# Patient Record
Sex: Female | Born: 1935
Health system: Southern US, Community
[De-identification: ages and names within clinical notes are randomized; demographics above are authoritative.]

## PROBLEM LIST (undated history)

## (undated) DIAGNOSIS — R9431 Abnormal electrocardiogram [ECG] [EKG]: Secondary | ICD-10-CM

## (undated) DIAGNOSIS — I5189 Other ill-defined heart diseases: Secondary | ICD-10-CM

## (undated) DIAGNOSIS — K219 Gastro-esophageal reflux disease without esophagitis: Secondary | ICD-10-CM

## (undated) DIAGNOSIS — E039 Hypothyroidism, unspecified: Secondary | ICD-10-CM

## (undated) DIAGNOSIS — I10 Essential (primary) hypertension: Secondary | ICD-10-CM

## (undated) HISTORY — PX: JOINT REPLACEMENT: SHX530

## (undated) HISTORY — PX: ABDOMINAL HYSTERECTOMY: SHX81

---

## 2011-05-16 DIAGNOSIS — H6500 Acute serous otitis media, unspecified ear: Secondary | ICD-10-CM | POA: Diagnosis not present

## 2011-05-16 DIAGNOSIS — J019 Acute sinusitis, unspecified: Secondary | ICD-10-CM | POA: Diagnosis not present

## 2011-05-16 DIAGNOSIS — J029 Acute pharyngitis, unspecified: Secondary | ICD-10-CM | POA: Diagnosis not present

## 2011-05-16 DIAGNOSIS — J209 Acute bronchitis, unspecified: Secondary | ICD-10-CM | POA: Diagnosis not present

## 2011-05-16 DIAGNOSIS — I119 Hypertensive heart disease without heart failure: Secondary | ICD-10-CM | POA: Diagnosis not present

## 2011-08-17 DIAGNOSIS — M810 Age-related osteoporosis without current pathological fracture: Secondary | ICD-10-CM | POA: Diagnosis not present

## 2011-08-17 DIAGNOSIS — E782 Mixed hyperlipidemia: Secondary | ICD-10-CM | POA: Diagnosis not present

## 2011-08-17 DIAGNOSIS — I119 Hypertensive heart disease without heart failure: Secondary | ICD-10-CM | POA: Diagnosis not present

## 2011-08-17 DIAGNOSIS — J4489 Other specified chronic obstructive pulmonary disease: Secondary | ICD-10-CM | POA: Diagnosis not present

## 2011-08-17 DIAGNOSIS — R5381 Other malaise: Secondary | ICD-10-CM | POA: Diagnosis not present

## 2011-08-17 DIAGNOSIS — E559 Vitamin D deficiency, unspecified: Secondary | ICD-10-CM | POA: Diagnosis not present

## 2011-08-17 DIAGNOSIS — J449 Chronic obstructive pulmonary disease, unspecified: Secondary | ICD-10-CM | POA: Diagnosis not present

## 2011-08-17 DIAGNOSIS — R5383 Other fatigue: Secondary | ICD-10-CM | POA: Diagnosis not present

## 2011-08-17 DIAGNOSIS — E039 Hypothyroidism, unspecified: Secondary | ICD-10-CM | POA: Diagnosis not present

## 2011-08-17 DIAGNOSIS — M2550 Pain in unspecified joint: Secondary | ICD-10-CM | POA: Diagnosis not present

## 2011-08-17 DIAGNOSIS — K2100 Gastro-esophageal reflux disease with esophagitis, without bleeding: Secondary | ICD-10-CM | POA: Diagnosis not present

## 2011-09-10 DIAGNOSIS — R5383 Other fatigue: Secondary | ICD-10-CM | POA: Diagnosis not present

## 2011-09-10 DIAGNOSIS — I119 Hypertensive heart disease without heart failure: Secondary | ICD-10-CM | POA: Diagnosis not present

## 2011-09-10 DIAGNOSIS — H6500 Acute serous otitis media, unspecified ear: Secondary | ICD-10-CM | POA: Diagnosis not present

## 2011-09-10 DIAGNOSIS — R5381 Other malaise: Secondary | ICD-10-CM | POA: Diagnosis not present

## 2011-09-10 DIAGNOSIS — I739 Peripheral vascular disease, unspecified: Secondary | ICD-10-CM | POA: Diagnosis not present

## 2011-09-10 DIAGNOSIS — R42 Dizziness and giddiness: Secondary | ICD-10-CM | POA: Diagnosis not present

## 2011-09-22 DIAGNOSIS — R229 Localized swelling, mass and lump, unspecified: Secondary | ICD-10-CM | POA: Diagnosis not present

## 2011-09-22 DIAGNOSIS — R42 Dizziness and giddiness: Secondary | ICD-10-CM | POA: Diagnosis not present

## 2011-09-22 DIAGNOSIS — D230 Other benign neoplasm of skin of lip: Secondary | ICD-10-CM | POA: Diagnosis not present

## 2011-10-06 DIAGNOSIS — R229 Localized swelling, mass and lump, unspecified: Secondary | ICD-10-CM | POA: Diagnosis not present

## 2011-12-26 DIAGNOSIS — I119 Hypertensive heart disease without heart failure: Secondary | ICD-10-CM | POA: Diagnosis not present

## 2011-12-26 DIAGNOSIS — R42 Dizziness and giddiness: Secondary | ICD-10-CM | POA: Diagnosis not present

## 2011-12-26 DIAGNOSIS — E559 Vitamin D deficiency, unspecified: Secondary | ICD-10-CM | POA: Diagnosis not present

## 2011-12-26 DIAGNOSIS — E039 Hypothyroidism, unspecified: Secondary | ICD-10-CM | POA: Diagnosis not present

## 2011-12-26 DIAGNOSIS — H6500 Acute serous otitis media, unspecified ear: Secondary | ICD-10-CM | POA: Diagnosis not present

## 2011-12-26 DIAGNOSIS — E782 Mixed hyperlipidemia: Secondary | ICD-10-CM | POA: Diagnosis not present

## 2011-12-26 DIAGNOSIS — R5383 Other fatigue: Secondary | ICD-10-CM | POA: Diagnosis not present

## 2011-12-26 DIAGNOSIS — N390 Urinary tract infection, site not specified: Secondary | ICD-10-CM | POA: Diagnosis not present

## 2011-12-26 DIAGNOSIS — R5381 Other malaise: Secondary | ICD-10-CM | POA: Diagnosis not present

## 2012-01-09 DIAGNOSIS — E782 Mixed hyperlipidemia: Secondary | ICD-10-CM | POA: Diagnosis not present

## 2012-01-09 DIAGNOSIS — H6500 Acute serous otitis media, unspecified ear: Secondary | ICD-10-CM | POA: Diagnosis not present

## 2012-01-09 DIAGNOSIS — I119 Hypertensive heart disease without heart failure: Secondary | ICD-10-CM | POA: Diagnosis not present

## 2012-01-09 DIAGNOSIS — R42 Dizziness and giddiness: Secondary | ICD-10-CM | POA: Diagnosis not present

## 2012-03-03 DIAGNOSIS — Z23 Encounter for immunization: Secondary | ICD-10-CM | POA: Diagnosis not present

## 2012-03-07 DIAGNOSIS — Z23 Encounter for immunization: Secondary | ICD-10-CM | POA: Diagnosis not present

## 2012-05-08 DIAGNOSIS — J1189 Influenza due to unidentified influenza virus with other manifestations: Secondary | ICD-10-CM | POA: Diagnosis not present

## 2012-05-08 DIAGNOSIS — J029 Acute pharyngitis, unspecified: Secondary | ICD-10-CM | POA: Diagnosis not present

## 2012-05-08 DIAGNOSIS — J209 Acute bronchitis, unspecified: Secondary | ICD-10-CM | POA: Diagnosis not present

## 2012-07-25 DIAGNOSIS — R21 Rash and other nonspecific skin eruption: Secondary | ICD-10-CM | POA: Diagnosis not present

## 2012-07-25 DIAGNOSIS — R42 Dizziness and giddiness: Secondary | ICD-10-CM | POA: Diagnosis not present

## 2012-07-25 DIAGNOSIS — H6500 Acute serous otitis media, unspecified ear: Secondary | ICD-10-CM | POA: Diagnosis not present

## 2012-09-02 DIAGNOSIS — R5383 Other fatigue: Secondary | ICD-10-CM | POA: Diagnosis not present

## 2012-09-02 DIAGNOSIS — R5381 Other malaise: Secondary | ICD-10-CM | POA: Diagnosis not present

## 2012-09-02 DIAGNOSIS — I119 Hypertensive heart disease without heart failure: Secondary | ICD-10-CM | POA: Diagnosis not present

## 2012-09-15 DIAGNOSIS — R42 Dizziness and giddiness: Secondary | ICD-10-CM | POA: Diagnosis not present

## 2012-09-15 DIAGNOSIS — R5381 Other malaise: Secondary | ICD-10-CM | POA: Diagnosis not present

## 2012-09-15 DIAGNOSIS — I119 Hypertensive heart disease without heart failure: Secondary | ICD-10-CM | POA: Diagnosis not present

## 2012-09-15 DIAGNOSIS — H6500 Acute serous otitis media, unspecified ear: Secondary | ICD-10-CM | POA: Diagnosis not present

## 2012-09-15 DIAGNOSIS — E782 Mixed hyperlipidemia: Secondary | ICD-10-CM | POA: Diagnosis not present

## 2012-09-15 DIAGNOSIS — E039 Hypothyroidism, unspecified: Secondary | ICD-10-CM | POA: Diagnosis not present

## 2012-09-15 DIAGNOSIS — R5383 Other fatigue: Secondary | ICD-10-CM | POA: Diagnosis not present

## 2012-09-15 DIAGNOSIS — K2100 Gastro-esophageal reflux disease with esophagitis, without bleeding: Secondary | ICD-10-CM | POA: Diagnosis not present

## 2012-09-23 DIAGNOSIS — I2510 Atherosclerotic heart disease of native coronary artery without angina pectoris: Secondary | ICD-10-CM | POA: Diagnosis not present

## 2012-09-23 DIAGNOSIS — I119 Hypertensive heart disease without heart failure: Secondary | ICD-10-CM | POA: Diagnosis not present

## 2012-09-23 DIAGNOSIS — R0602 Shortness of breath: Secondary | ICD-10-CM | POA: Diagnosis not present

## 2012-10-14 DIAGNOSIS — R5383 Other fatigue: Secondary | ICD-10-CM | POA: Diagnosis not present

## 2012-10-14 DIAGNOSIS — I119 Hypertensive heart disease without heart failure: Secondary | ICD-10-CM | POA: Diagnosis not present

## 2012-10-14 DIAGNOSIS — R5381 Other malaise: Secondary | ICD-10-CM | POA: Diagnosis not present

## 2012-11-11 DIAGNOSIS — R5381 Other malaise: Secondary | ICD-10-CM | POA: Diagnosis not present

## 2012-11-11 DIAGNOSIS — I119 Hypertensive heart disease without heart failure: Secondary | ICD-10-CM | POA: Diagnosis not present

## 2012-11-11 DIAGNOSIS — J1189 Influenza due to unidentified influenza virus with other manifestations: Secondary | ICD-10-CM | POA: Diagnosis not present

## 2012-11-11 DIAGNOSIS — H6500 Acute serous otitis media, unspecified ear: Secondary | ICD-10-CM | POA: Diagnosis not present

## 2012-11-11 DIAGNOSIS — R5383 Other fatigue: Secondary | ICD-10-CM | POA: Diagnosis not present

## 2012-11-19 DIAGNOSIS — I119 Hypertensive heart disease without heart failure: Secondary | ICD-10-CM | POA: Diagnosis not present

## 2012-11-19 DIAGNOSIS — R42 Dizziness and giddiness: Secondary | ICD-10-CM | POA: Diagnosis not present

## 2012-11-19 DIAGNOSIS — M2550 Pain in unspecified joint: Secondary | ICD-10-CM | POA: Diagnosis not present

## 2012-11-19 DIAGNOSIS — H6500 Acute serous otitis media, unspecified ear: Secondary | ICD-10-CM | POA: Diagnosis not present

## 2013-01-15 DIAGNOSIS — Z23 Encounter for immunization: Secondary | ICD-10-CM | POA: Diagnosis not present

## 2013-01-19 DIAGNOSIS — K219 Gastro-esophageal reflux disease without esophagitis: Secondary | ICD-10-CM | POA: Diagnosis not present

## 2013-01-19 DIAGNOSIS — I10 Essential (primary) hypertension: Secondary | ICD-10-CM | POA: Diagnosis not present

## 2013-01-19 DIAGNOSIS — E039 Hypothyroidism, unspecified: Secondary | ICD-10-CM | POA: Diagnosis not present

## 2013-01-19 DIAGNOSIS — E785 Hyperlipidemia, unspecified: Secondary | ICD-10-CM | POA: Diagnosis not present

## 2013-02-18 DIAGNOSIS — R0602 Shortness of breath: Secondary | ICD-10-CM | POA: Diagnosis not present

## 2013-02-19 ENCOUNTER — Telehealth (HOSPITAL_COMMUNITY): Payer: Self-pay | Admitting: *Deleted

## 2013-02-20 ENCOUNTER — Encounter (HOSPITAL_COMMUNITY): Payer: Self-pay | Admitting: *Deleted

## 2013-02-20 ENCOUNTER — Other Ambulatory Visit (HOSPITAL_COMMUNITY): Payer: Self-pay | Admitting: Internal Medicine

## 2013-02-20 DIAGNOSIS — R0602 Shortness of breath: Secondary | ICD-10-CM

## 2013-03-01 DIAGNOSIS — K449 Diaphragmatic hernia without obstruction or gangrene: Secondary | ICD-10-CM | POA: Diagnosis not present

## 2013-03-01 DIAGNOSIS — I517 Cardiomegaly: Secondary | ICD-10-CM | POA: Diagnosis not present

## 2013-03-01 DIAGNOSIS — M8448XA Pathological fracture, other site, initial encounter for fracture: Secondary | ICD-10-CM | POA: Diagnosis not present

## 2013-03-01 DIAGNOSIS — R030 Elevated blood-pressure reading, without diagnosis of hypertension: Secondary | ICD-10-CM | POA: Diagnosis not present

## 2013-03-01 DIAGNOSIS — E876 Hypokalemia: Secondary | ICD-10-CM | POA: Diagnosis not present

## 2013-03-01 DIAGNOSIS — S32009A Unspecified fracture of unspecified lumbar vertebra, initial encounter for closed fracture: Secondary | ICD-10-CM | POA: Diagnosis not present

## 2013-03-01 DIAGNOSIS — I129 Hypertensive chronic kidney disease with stage 1 through stage 4 chronic kidney disease, or unspecified chronic kidney disease: Secondary | ICD-10-CM | POA: Diagnosis not present

## 2013-03-01 DIAGNOSIS — S22009A Unspecified fracture of unspecified thoracic vertebra, initial encounter for closed fracture: Secondary | ICD-10-CM | POA: Diagnosis not present

## 2013-03-01 DIAGNOSIS — J9819 Other pulmonary collapse: Secondary | ICD-10-CM | POA: Diagnosis not present

## 2013-03-01 DIAGNOSIS — J449 Chronic obstructive pulmonary disease, unspecified: Secondary | ICD-10-CM | POA: Diagnosis not present

## 2013-03-01 DIAGNOSIS — M899 Disorder of bone, unspecified: Secondary | ICD-10-CM | POA: Diagnosis not present

## 2013-03-01 DIAGNOSIS — I959 Hypotension, unspecified: Secondary | ICD-10-CM | POA: Diagnosis not present

## 2013-03-01 DIAGNOSIS — N189 Chronic kidney disease, unspecified: Secondary | ICD-10-CM | POA: Diagnosis not present

## 2013-03-02 DIAGNOSIS — J9819 Other pulmonary collapse: Secondary | ICD-10-CM | POA: Diagnosis not present

## 2013-03-02 DIAGNOSIS — M8448XA Pathological fracture, other site, initial encounter for fracture: Secondary | ICD-10-CM | POA: Diagnosis not present

## 2013-03-03 ENCOUNTER — Encounter (HOSPITAL_COMMUNITY): Payer: Self-pay

## 2013-03-17 ENCOUNTER — Encounter (HOSPITAL_COMMUNITY): Payer: Self-pay

## 2013-04-21 DIAGNOSIS — I10 Essential (primary) hypertension: Secondary | ICD-10-CM | POA: Diagnosis not present

## 2013-04-21 DIAGNOSIS — Z0000 Encounter for general adult medical examination without abnormal findings: Secondary | ICD-10-CM | POA: Diagnosis not present

## 2013-04-21 DIAGNOSIS — E669 Obesity, unspecified: Secondary | ICD-10-CM | POA: Diagnosis not present

## 2013-04-21 DIAGNOSIS — E039 Hypothyroidism, unspecified: Secondary | ICD-10-CM | POA: Diagnosis not present

## 2013-04-24 ENCOUNTER — Telehealth (HOSPITAL_COMMUNITY): Payer: Self-pay | Admitting: *Deleted

## 2013-04-27 ENCOUNTER — Other Ambulatory Visit (HOSPITAL_COMMUNITY): Payer: Self-pay | Admitting: Internal Medicine

## 2013-04-27 DIAGNOSIS — Z1231 Encounter for screening mammogram for malignant neoplasm of breast: Secondary | ICD-10-CM

## 2013-05-04 ENCOUNTER — Inpatient Hospital Stay (HOSPITAL_COMMUNITY)
Admission: EM | Admit: 2013-05-04 | Discharge: 2013-05-07 | DRG: 202 | Disposition: A | Discharge status: Home / Self Care | Payer: Medicare Other | Attending: Internal Medicine | Admitting: Internal Medicine

## 2013-05-04 DIAGNOSIS — I951 Orthostatic hypotension: Secondary | ICD-10-CM | POA: Diagnosis not present

## 2013-05-04 DIAGNOSIS — R0602 Shortness of breath: Secondary | ICD-10-CM | POA: Diagnosis not present

## 2013-05-04 DIAGNOSIS — K219 Gastro-esophageal reflux disease without esophagitis: Secondary | ICD-10-CM | POA: Diagnosis present

## 2013-05-04 DIAGNOSIS — E860 Dehydration: Secondary | ICD-10-CM | POA: Diagnosis not present

## 2013-05-04 DIAGNOSIS — I10 Essential (primary) hypertension: Secondary | ICD-10-CM | POA: Diagnosis not present

## 2013-05-04 DIAGNOSIS — R9431 Abnormal electrocardiogram [ECG] [EKG]: Secondary | ICD-10-CM

## 2013-05-04 DIAGNOSIS — E039 Hypothyroidism, unspecified: Secondary | ICD-10-CM | POA: Diagnosis present

## 2013-05-04 DIAGNOSIS — R51 Headache: Secondary | ICD-10-CM | POA: Diagnosis not present

## 2013-05-04 DIAGNOSIS — I5031 Acute diastolic (congestive) heart failure: Secondary | ICD-10-CM

## 2013-05-04 DIAGNOSIS — E871 Hypo-osmolality and hyponatremia: Secondary | ICD-10-CM | POA: Diagnosis not present

## 2013-05-04 DIAGNOSIS — D649 Anemia, unspecified: Secondary | ICD-10-CM | POA: Diagnosis present

## 2013-05-04 DIAGNOSIS — I509 Heart failure, unspecified: Secondary | ICD-10-CM | POA: Diagnosis present

## 2013-05-04 DIAGNOSIS — Z79899 Other long term (current) drug therapy: Secondary | ICD-10-CM

## 2013-05-04 DIAGNOSIS — J209 Acute bronchitis, unspecified: Secondary | ICD-10-CM | POA: Diagnosis not present

## 2013-05-04 DIAGNOSIS — E8779 Other fluid overload: Secondary | ICD-10-CM | POA: Diagnosis present

## 2013-05-04 DIAGNOSIS — J189 Pneumonia, unspecified organism: Secondary | ICD-10-CM

## 2013-05-04 DIAGNOSIS — J9600 Acute respiratory failure, unspecified whether with hypoxia or hypercapnia: Secondary | ICD-10-CM | POA: Diagnosis not present

## 2013-05-04 DIAGNOSIS — R0989 Other specified symptoms and signs involving the circulatory and respiratory systems: Secondary | ICD-10-CM | POA: Diagnosis not present

## 2013-05-04 DIAGNOSIS — I959 Hypotension, unspecified: Secondary | ICD-10-CM | POA: Diagnosis not present

## 2013-05-04 DIAGNOSIS — I059 Rheumatic mitral valve disease, unspecified: Secondary | ICD-10-CM | POA: Diagnosis not present

## 2013-05-04 DIAGNOSIS — R42 Dizziness and giddiness: Secondary | ICD-10-CM | POA: Diagnosis not present

## 2013-05-04 DIAGNOSIS — R059 Cough, unspecified: Secondary | ICD-10-CM | POA: Diagnosis not present

## 2013-05-04 DIAGNOSIS — R0600 Dyspnea, unspecified: Secondary | ICD-10-CM

## 2013-05-04 DIAGNOSIS — J9819 Other pulmonary collapse: Secondary | ICD-10-CM | POA: Diagnosis not present

## 2013-05-04 DIAGNOSIS — R0609 Other forms of dyspnea: Secondary | ICD-10-CM | POA: Diagnosis not present

## 2013-05-04 HISTORY — DX: Essential (primary) hypertension: I10

## 2013-05-04 HISTORY — DX: Hypothyroidism, unspecified: E03.9

## 2013-05-04 HISTORY — DX: Gastro-esophageal reflux disease without esophagitis: K21.9

## 2013-05-04 HISTORY — DX: Abnormal electrocardiogram (ECG) (EKG): R94.31

## 2013-05-04 HISTORY — DX: Other ill-defined heart diseases: I51.89

## 2013-05-04 LAB — GLUCOSE, CAPILLARY

## 2013-05-04 MED ORDER — SODIUM CHLORIDE 0.9 % IV BOLUS (SEPSIS)
1000.0000 mL | Freq: Once | INTRAVENOUS | Status: AC
  Administered 2013-05-05: 1000 mL via INTRAVENOUS

## 2013-05-04 NOTE — ED Provider Notes (Signed)
CSN: [id number]     Arrival date & time 05/04/13  2308 History   First MD Initiated Contact with Patient 05/04/13 2358     Chief Complaint  Patient presents with  . Shortness of Breath  . Dizziness   (Consider location/radiation/quality/duration/timing/severity/associated sxs/prior Treatment) HPI Hx per PT and her son bedside. Not feeling well yesterday with cough and generalized weakness. Called her son requesting to come to the ER. She has developed SOB, HA and near syncope.  No F/C. No sore throat ir runny nose, no productive cough. No known sick contacts.  At triage noted to have BP 58/40. No ABd pain, no N/V/D. No CP. No diff with speech.   No past medical history on file. No past surgical history on file. No family history on file. History  Substance Use Topics  . Smoking status: Not on file  . Smokeless tobacco: Not on file  . Alcohol Use: Not on file   OB History   No data available     Review of Systems  Constitutional: Negative for fever and chills.  HENT: Negative for congestion, rhinorrhea and sore throat.   Eyes: Negative for visual disturbance.  Respiratory: Positive for cough and shortness of breath.   Cardiovascular: Negative for chest pain.  Gastrointestinal: Negative for vomiting and abdominal pain.  Genitourinary: Negative for dysuria.  Musculoskeletal: Negative for back pain, neck pain and neck stiffness.  Skin: Negative for rash.  Neurological: Positive for dizziness and headaches.  All other systems reviewed and are negative.    Allergies  Review of patient's allergies indicates not on file.  Home Medications  No current outpatient prescriptions on file. BP 58/40  Pulse 61  Temp(Src) 97.7 F (36.5 C) (Oral)  Resp 18  Ht 5\' 5"  (1.651 m)  Wt 160 lb (72.576 kg)  BMI 26.63 kg/m2  SpO2 97% Physical Exam  Constitutional: She is oriented to person, place, and time. She appears well-developed and well-nourished.  HENT:  Head: Normocephalic and  atraumatic.  Mouth/Throat: No oropharyngeal exudate.  Dry mm  Eyes: EOM are normal. Pupils are equal, round, and reactive to light.  Neck: Neck supple.  Cardiovascular: Normal rate, regular rhythm and intact distal pulses.   Pulmonary/Chest: Effort normal and breath sounds normal. No respiratory distress. She exhibits no tenderness.  Abdominal: Soft. She exhibits no distension. There is no tenderness.  Musculoskeletal: Normal range of motion. She exhibits no edema and no tenderness.  Neurological: She is alert and oriented to person, place, and time. No cranial nerve deficit. Coordination normal.  Equal grips strengths, weak. No facial droop or pronator drift  Skin: Skin is warm and dry.    ED Course  Procedures (including critical care time) Labs Review Labs Reviewed  GLUCOSE, CAPILLARY - Abnormal; Notable for the following:    Glucose-Capillary 116 (*)    All other components within normal limits  CBC WITH DIFFERENTIAL - Abnormal; Notable for the following:    HCT 34.9 (*)    Platelets 420 (*)    Monocytes Relative 15 (*)    Monocytes Absolute 1.5 (*)    All other components within normal limits  COMPREHENSIVE METABOLIC PANEL - Abnormal; Notable for the following:    Sodium 123 (*)    Chloride 86 (*)    Glucose, Bld 113 (*)    GFR calc non Af Amer 54 (*)    GFR calc Af Amer 62 (*)    All other components within normal limits  URINALYSIS, ROUTINE W REFLEX  MICROSCOPIC - Abnormal; Notable for the following:    APPearance CLOUDY (*)    All other components within normal limits  POCT I-STAT, CHEM 8 - Abnormal; Notable for the following:    Sodium 124 (*)    Chloride 90 (*)    Creatinine, Ser 1.20 (*)    Glucose, Bld 112 (*)    Calcium, Ion 1.11 (*)    All other components within normal limits  CG4 I-STAT (LACTIC ACID) - Abnormal; Notable for the following:    Lactic Acid, Venous 2.30 (*)    All other components within normal limits  POCT I-STAT TROPONIN I - Abnormal;  Notable for the following:    Troponin i, poc 0.11 (*)    All other components within normal limits  TROPONIN I  TSH  T4, FREE  INFLUENZA PANEL BY PCR   Imaging Review Ct Head Wo Contrast  05/05/2013   CLINICAL DATA:  Cough for about 2 weeks. Headache. Dizziness. Diaphoresis.  EXAM: CT HEAD WITHOUT CONTRAST  TECHNIQUE: Contiguous axial images were obtained from the base of the skull through the vertex without intravenous contrast.  COMPARISON:  None.  FINDINGS: Diffuse cerebral atrophy. No significant ventricular dilatation. Patchy low-attenuation changes in the deep white matter consistent with small vessel ischemic change. No mass effect or midline shift. No abnormal extra-axial fluid collections. Gray-white matter junctions are distinct. Basal cisterns are not effaced. No evidence of acute intracranial hemorrhage. No depressed skull fractures. Visualized paranasal sinuses and mastoid air cells are not opacified.  IMPRESSION: No acute intracranial abnormalities. Chronic atrophy and small vessel ischemic changes.   Electronically Signed   By: Burman Nieves M.D.   On: 05/05/2013 01:00   Dg Chest Portable 1 View  05/05/2013   CLINICAL DATA:  Shortness of breath and dizziness.  EXAM: PORTABLE CHEST - 1 VIEW  COMPARISON:  Chest radiograph February 18, 2013  FINDINGS: Cardiac silhouette appears mildly enlarged. Low inspiratory examination with crowded vasculature markings, mild central pulmonary vasculature congestion. Small left pleural effusion. Strandy densities in left lung base. No pneumothorax.  Multiple EKG lines overlie the patient and may obscure subtle underlying pathology. Degenerative changes of the cervical spine.  IMPRESSION: Mild cardiomegaly and central pulmonary vasculature congestion. Small left pleural effusion with left lower lobe atelectasis, less likely pneumonia. Consider follow-up PA and lateral views of the chest after treatment to verify improvement.   Electronically Signed    By: Awilda Metro   On: 05/05/2013 00:38    EKG Interpretation    Date/Time:  Monday May 04 2013 23:52:54 EST Ventricular Rate:  78 PR Interval:  179 QRS Duration: 99 QT Interval:  403 QTC Calculation: 459 R Axis:   26 Text Interpretation:  Sinus rhythm Low voltage, precordial leads Non-specific ST-t changes No old tracing to compare Confirmed by Britteny Fiebelkorn  MD, Adelma Bowdoin [phone]) on 05/05/2013 12:08:44 AM           CRITICAL CARE Performed by: Sunnie Nielsen Total critical care time: 40 Critical care time was exclusive of separately billable procedures and treating other patients. Critical care was necessary to treat or prevent imminent or life-threatening deterioration. Critical care was time spent personally by me on the following activities: development of treatment plan with patient and/or surrogate as well as nursing, discussions with consultants, evaluation of patient's response to treatment, examination of patient, obtaining history from patient or surrogate, ordering and performing treatments and interventions, ordering and review of laboratory studies, ordering and review of radiographic studies, pulse oximetry and  re-evaluation of patient's condition. Aggressive IVF resus for severe hypotension, improved with 2L IVFs, IV ABx intiated, no obvious infectious source with work up as above.   4:12 AM DR Adela Glimpse will evaluate bedside for admit MDM  Dx: Hypotension, dehydration, elevated lactate, hyponatremia, dyspnea  ECG, CXR, labs, CT brain reviewed as above Improved with IVF resuscitation IV ABx MED admit   Sunnie Nielsen, MD 05/05/13 [phone]

## 2013-05-04 NOTE — ED Notes (Signed)
Yesterday pt started to feel bad per son. Pt has been having a cough for about 2 weeks. This pm pt started to have a h/a, dry cough, dizziness and sob per son. Pt asked son to take her to to ED. In lobby upon standing pt got very dizzy and diaphoretic. In triage pt with b/p pof 58.

## 2013-05-05 ENCOUNTER — Emergency Department (HOSPITAL_COMMUNITY): Payer: Medicare Other

## 2013-05-05 ENCOUNTER — Encounter (HOSPITAL_COMMUNITY): Payer: Self-pay | Admitting: Emergency Medicine

## 2013-05-05 DIAGNOSIS — J209 Acute bronchitis, unspecified: Secondary | ICD-10-CM | POA: Diagnosis not present

## 2013-05-05 DIAGNOSIS — J189 Pneumonia, unspecified organism: Secondary | ICD-10-CM | POA: Insufficient documentation

## 2013-05-05 DIAGNOSIS — J9600 Acute respiratory failure, unspecified whether with hypoxia or hypercapnia: Secondary | ICD-10-CM | POA: Diagnosis not present

## 2013-05-05 DIAGNOSIS — E860 Dehydration: Secondary | ICD-10-CM | POA: Insufficient documentation

## 2013-05-05 DIAGNOSIS — R42 Dizziness and giddiness: Secondary | ICD-10-CM | POA: Diagnosis not present

## 2013-05-05 DIAGNOSIS — I5031 Acute diastolic (congestive) heart failure: Secondary | ICD-10-CM | POA: Diagnosis not present

## 2013-05-05 DIAGNOSIS — I059 Rheumatic mitral valve disease, unspecified: Secondary | ICD-10-CM

## 2013-05-05 DIAGNOSIS — I959 Hypotension, unspecified: Secondary | ICD-10-CM | POA: Diagnosis not present

## 2013-05-05 DIAGNOSIS — R51 Headache: Secondary | ICD-10-CM | POA: Diagnosis not present

## 2013-05-05 DIAGNOSIS — I951 Orthostatic hypotension: Secondary | ICD-10-CM | POA: Diagnosis not present

## 2013-05-05 DIAGNOSIS — R0989 Other specified symptoms and signs involving the circulatory and respiratory systems: Secondary | ICD-10-CM | POA: Diagnosis not present

## 2013-05-05 DIAGNOSIS — E871 Hypo-osmolality and hyponatremia: Secondary | ICD-10-CM | POA: Diagnosis present

## 2013-05-05 LAB — INFLUENZA PANEL BY PCR (TYPE A & B)
H1N1 flu by pcr: NOT DETECTED
Influenza A By PCR: NEGATIVE

## 2013-05-05 LAB — CBC
HCT: 32.5 % — ABNORMAL LOW (ref 36.0–46.0)
MCHC: 35.1 g/dL (ref 30.0–36.0)
MCV: 89.3 fL (ref 78.0–100.0)
Platelets: 344 10*3/uL (ref 150–400)
RBC: 3.64 MIL/uL — ABNORMAL LOW (ref 3.87–5.11)
RDW: 12.2 % (ref 11.5–15.5)
WBC: 8 10*3/uL (ref 4.0–10.5)

## 2013-05-05 LAB — PROCALCITONIN: Procalcitonin: <0.1 ng/mL

## 2013-05-05 LAB — CBC WITH DIFFERENTIAL/PLATELET
Basophils Absolute: 0.1 10*3/uL (ref 0.0–0.1)
Eosinophils Absolute: 0.3 10*3/uL (ref 0.0–0.7)
Eosinophils Relative: 3 % (ref 0–5)
Hemoglobin: 12.4 g/dL (ref 12.0–15.0)
Lymphocytes Relative: 34 % (ref 12–46)
MCV: 88.4 fL (ref 78.0–100.0)
Neutrophils Relative %: 48 % (ref 43–77)
Platelets: 420 10*3/uL — ABNORMAL HIGH (ref 150–400)
RDW: 12 % (ref 11.5–15.5)
WBC: 9.8 10*3/uL (ref 4.0–10.5)

## 2013-05-05 LAB — T4, FREE: Free T4: 1.48 ng/dL (ref 0.80–1.80)

## 2013-05-05 LAB — COMPREHENSIVE METABOLIC PANEL
ALT: 17 U/L (ref 0–35)
AST: 22 U/L (ref 0–37)
Albumin: 3.6 g/dL (ref 3.5–5.2)
CO2: 22 mEq/L (ref 19–32)
Calcium: 9.2 mg/dL (ref 8.4–10.5)
Chloride: 86 mEq/L — ABNORMAL LOW (ref 96–112)
Glucose, Bld: 113 mg/dL — ABNORMAL HIGH (ref 70–99)
Potassium: 4.1 mEq/L (ref 3.7–5.3)
Sodium: 123 mEq/L — ABNORMAL LOW (ref 137–147)
Total Bilirubin: 0.3 mg/dL (ref 0.3–1.2)
Total Protein: 6.4 g/dL (ref 6.0–8.3)

## 2013-05-05 LAB — POCT I-STAT, CHEM 8
BUN: 17 mg/dL (ref 6–23)
Chloride: 90 mEq/L — ABNORMAL LOW (ref 96–112)
HCT: 37 % (ref 36.0–46.0)
Sodium: 124 mEq/L — ABNORMAL LOW (ref 137–147)
TCO2: 23 mmol/L (ref 0–100)

## 2013-05-05 LAB — URINALYSIS, ROUTINE W REFLEX MICROSCOPIC
Bilirubin Urine: NEGATIVE
Hgb urine dipstick: NEGATIVE
Ketones, ur: NEGATIVE mg/dL
Leukocytes, UA: NEGATIVE
Specific Gravity, Urine: 1.023 (ref 1.005–1.030)
pH: 6 (ref 5.0–8.0)

## 2013-05-05 LAB — CG4 I-STAT (LACTIC ACID): Lactic Acid, Venous: 2.3 mmol/L — ABNORMAL HIGH (ref 0.5–2.2)

## 2013-05-05 LAB — TROPONIN I
Troponin I: <0.3 ng/mL (ref <0.30)
Troponin I: <0.3 ng/mL (ref <0.30)
Troponin I: <0.3 ng/mL (ref <0.30)
Troponin I: <0.3 ng/mL (ref <0.30)

## 2013-05-05 LAB — POCT I-STAT TROPONIN I: Troponin i, poc: 0.11 ng/mL (ref 0.00–0.08)

## 2013-05-05 LAB — CREATININE, SERUM: GFR calc Af Amer: 72 mL/min — ABNORMAL LOW (ref >90)

## 2013-05-05 MED ORDER — ASPIRIN 81 MG PO CHEW
324.0000 mg | CHEWABLE_TABLET | Freq: Once | ORAL | Status: AC
  Administered 2013-05-05: 324 mg via ORAL
  Filled 2013-05-05: qty 4

## 2013-05-05 MED ORDER — BENZONATATE 100 MG PO CAPS
200.0000 mg | ORAL_CAPSULE | Freq: Three times a day (TID) | ORAL | Status: DC | PRN
  Administered 2013-05-05 – 2013-05-06 (×2): 200 mg via ORAL
  Filled 2013-05-05 (×2): qty 2

## 2013-05-05 MED ORDER — ONDANSETRON HCL 4 MG/2ML IJ SOLN: 4.0000 mg | Freq: Four times a day (QID) | INTRAMUSCULAR | Status: DC | PRN

## 2013-05-05 MED ORDER — DEXTROMETHORPHAN POLISTIREX 30 MG/5ML PO LQCR
30.0000 mg | Freq: Two times a day (BID) | ORAL | Status: DC
  Administered 2013-05-05 – 2013-05-07 (×5): 30 mg via ORAL
  Filled 2013-05-05 (×7): qty 5

## 2013-05-05 MED ORDER — AZITHROMYCIN 500 MG PO TABS
500.0000 mg | ORAL_TABLET | Freq: Every day | ORAL | Status: DC
  Administered 2013-05-05 – 2013-05-07 (×3): 500 mg via ORAL
  Filled 2013-05-05 (×3): qty 1

## 2013-05-05 MED ORDER — VITAMINS A & D EX OINT
TOPICAL_OINTMENT | CUTANEOUS | Status: AC
  Administered 2013-05-05: 5
  Filled 2013-05-05: qty 5

## 2013-05-05 MED ORDER — PANTOPRAZOLE SODIUM 40 MG PO TBEC
40.0000 mg | DELAYED_RELEASE_TABLET | Freq: Every day | ORAL | Status: DC
  Administered 2013-05-05 – 2013-05-07 (×3): 40 mg via ORAL
  Filled 2013-05-05 (×3): qty 1

## 2013-05-05 MED ORDER — FUROSEMIDE 10 MG/ML IJ SOLN
20.0000 mg | Freq: Once | INTRAMUSCULAR | Status: AC
  Administered 2013-05-05: 20 mg via INTRAVENOUS
  Filled 2013-05-05: qty 2

## 2013-05-05 MED ORDER — DEXTROMETHORPHAN POLISTIREX 30 MG/5ML PO LQCR
30.0000 mg | Freq: Two times a day (BID) | ORAL | Status: DC | PRN
  Administered 2013-05-05: 30 mg via ORAL
  Filled 2013-05-05: qty 5

## 2013-05-05 MED ORDER — SODIUM CHLORIDE 0.9 % IV BOLUS (SEPSIS)
1000.0000 mL | Freq: Once | INTRAVENOUS | Status: AC
  Administered 2013-05-05: 1000 mL via INTRAVENOUS

## 2013-05-05 MED ORDER — OSELTAMIVIR PHOSPHATE 75 MG PO CAPS
75.0000 mg | ORAL_CAPSULE | Freq: Two times a day (BID) | ORAL | Status: DC
  Administered 2013-05-05: 75 mg via ORAL
  Filled 2013-05-05 (×2): qty 1

## 2013-05-05 MED ORDER — LOPERAMIDE HCL 2 MG PO CAPS
4.0000 mg | ORAL_CAPSULE | ORAL | Status: DC | PRN
  Administered 2013-05-05 – 2013-05-06 (×2): 4 mg via ORAL
  Filled 2013-05-05 (×2): qty 2

## 2013-05-05 MED ORDER — SODIUM CHLORIDE 0.9 % IV SOLN
INTRAVENOUS | Status: DC
  Administered 2013-05-05: 01:00:00 via INTRAVENOUS

## 2013-05-05 MED ORDER — GUAIFENESIN ER 600 MG PO TB12
600.0000 mg | ORAL_TABLET | Freq: Two times a day (BID) | ORAL | Status: DC
  Administered 2013-05-05: 600 mg via ORAL
  Filled 2013-05-05 (×3): qty 1

## 2013-05-05 MED ORDER — ENOXAPARIN SODIUM 40 MG/0.4ML ~~LOC~~ SOLN
40.0000 mg | SUBCUTANEOUS | Status: DC
  Administered 2013-05-05 – 2013-05-07 (×3): 40 mg via SUBCUTANEOUS
  Filled 2013-05-05 (×4): qty 0.4

## 2013-05-05 MED ORDER — GUAIFENESIN-DM 100-10 MG/5ML PO SYRP: 5.0000 mL | ORAL_SOLUTION | ORAL | Status: DC | PRN

## 2013-05-05 MED ORDER — GUAIFENESIN ER 600 MG PO TB12: 600.0000 mg | ORAL_TABLET | Freq: Two times a day (BID) | ORAL | Status: DC

## 2013-05-05 MED ORDER — PIPERACILLIN-TAZOBACTAM 3.375 G IVPB
3.3750 g | Freq: Once | INTRAVENOUS | Status: AC
  Administered 2013-05-05: 3.375 g via INTRAVENOUS
  Filled 2013-05-05: qty 50

## 2013-05-05 MED ORDER — GUAIFENESIN ER 600 MG PO TB12
1200.0000 mg | ORAL_TABLET | Freq: Two times a day (BID) | ORAL | Status: DC
  Administered 2013-05-05 – 2013-05-07 (×4): 1200 mg via ORAL
  Filled 2013-05-05 (×5): qty 2

## 2013-05-05 MED ORDER — IPRATROPIUM BROMIDE 0.02 % IN SOLN
0.5000 mg | Freq: Four times a day (QID) | RESPIRATORY_TRACT | Status: DC
  Administered 2013-05-05: 0.5 mg via RESPIRATORY_TRACT
  Filled 2013-05-05: qty 2.5

## 2013-05-05 MED ORDER — VANCOMYCIN HCL IN DEXTROSE 1-5 GM/200ML-% IV SOLN
1000.0000 mg | Freq: Once | INTRAVENOUS | Status: AC
  Administered 2013-05-05: 1000 mg via INTRAVENOUS
  Filled 2013-05-05: qty 200

## 2013-05-05 MED ORDER — IPRATROPIUM BROMIDE 0.02 % IN SOLN: 0.5000 mg | Freq: Four times a day (QID) | RESPIRATORY_TRACT | Status: DC | PRN

## 2013-05-05 MED ORDER — DEXTROSE 5 % IV SOLN
1.0000 g | INTRAVENOUS | Status: DC
  Administered 2013-05-05 – 2013-05-06 (×2): 1 g via INTRAVENOUS
  Filled 2013-05-05 (×2): qty 10

## 2013-05-05 MED ORDER — SODIUM CHLORIDE 0.9 % IV SOLN
INTRAVENOUS | Status: DC
  Administered 2013-05-05: 06:00:00 via INTRAVENOUS

## 2013-05-05 MED ORDER — LEVALBUTEROL HCL 0.63 MG/3ML IN NEBU: 0.6300 mg | INHALATION_SOLUTION | RESPIRATORY_TRACT | Status: DC | PRN

## 2013-05-05 MED ORDER — LEVOTHYROXINE SODIUM 88 MCG PO TABS
88.0000 ug | ORAL_TABLET | Freq: Every day | ORAL | Status: DC
  Administered 2013-05-05 – 2013-05-07 (×3): 88 ug via ORAL
  Filled 2013-05-05 (×5): qty 1

## 2013-05-05 MED ORDER — LEVALBUTEROL HCL 0.63 MG/3ML IN NEBU
0.6300 mg | INHALATION_SOLUTION | Freq: Four times a day (QID) | RESPIRATORY_TRACT | Status: DC
  Administered 2013-05-05: 0.63 mg via RESPIRATORY_TRACT
  Filled 2013-05-05 (×5): qty 3

## 2013-05-05 NOTE — Evaluation (Signed)
Physical Therapy Evaluation Patient Details Name: Ellen Chandler MRN: [id number] DOB: 10-May-1930 Today's Date: 05/05/2013 Time: 4098-1191 PT Time Calculation (min): 8 min  PT Assessment / Plan / Recommendation History of Present Illness  79 yo female admitted with Pna, SOB, dizziness, hypotension. Pt is from [employer] [facility].   Clinical Impression  On eval, pt required Min guard assist for mobility-able to ambulate short distance in room with walker. Demonstrates general weakness, decreased activity tolerance. Prior to admission pt was Independent with mobility and did not require an assistive device. Recommend HHPT at discharge-pt prefers to d/c home. May need to consider ST rehab placement if mobility does not improve.     PT Assessment  Patient needs continued PT services    Follow Up Recommendations  Home health PT (May need to consider ST rehab if mobility does not improve. Pt prefers to d/c home. )    Does the patient have the potential to tolerate intense rehabilitation      Barriers to Discharge        Equipment Recommendations  None recommended by PT    Recommendations for Other Services OT consult   Frequency Min 3X/week    Precautions / Restrictions Precautions Precautions: Fall Restrictions Weight Bearing Restrictions: No   Pertinent Vitals/Pain No c/o pain      Mobility  Bed Mobility Bed Mobility: Supine to Sit;Sit to Supine Supine to Sit: 6: Modified independent (Device/Increase time) Sit to Supine: 6: Modified independent (Device/Increase time) Transfers Transfers: Sit to Stand;Stand to Sit Sit to Stand: 4: Min guard;From bed Stand to Sit: 4: Min guard;To bed Details for Transfer Assistance: VCs safety, technique, hand placement Ambulation/Gait Ambulation/Gait Assistance: 4: Min guard Ambulation Distance (Feet): 15 Feet Assistive device: Rolling walker Ambulation/Gait Assistance Details: slow gait speed. pt fatigues easily. dyspnea 2/4  noted with short walk in room.  Gait Pattern: Step-through pattern    Exercises     PT Diagnosis: Difficulty walking;Generalized weakness  PT Problem List: Decreased strength;Decreased activity tolerance;Decreased mobility;Decreased balance;Decreased knowledge of use of DME;Obesity PT Treatment Interventions: DME instruction;Gait training;Functional mobility training;Therapeutic activities;Therapeutic exercise;Patient/family education     PT Goals(Current goals can be found in the care plan section) Acute Rehab PT Goals Patient Stated Goal: home soon. to feel better PT Goal Formulation: With patient Time For Goal Achievement: 05/19/13 Potential to Achieve Goals: Good  Visit Information  Last PT Received On: 05/05/13 Assistance Needed: +1 History of Present Illness: 79 yo female admitted with Pna, SOB, dizziness, hypotension. Pt is from [employer].        Prior Functioning  Home Living Family/patient expects to be discharged to:: Private residence Living Arrangements: Alone Type of Home: Independent living facility Home Access: Level entry Home Layout: One level Home Equipment: Environmental consultant - 2 wheels;Bedside commode Prior Function Level of Independence: Independent Communication Communication: No difficulties    Cognition  Cognition Arousal/Alertness: Awake/alert Behavior During Therapy: WFL for tasks assessed/performed Overall Cognitive Status: Within Functional Limits for tasks assessed    Extremity/Trunk Assessment Upper Extremity Assessment Upper Extremity Assessment: Generalized weakness Cervical / Trunk Assessment Cervical / Trunk Assessment: Normal   Balance    End of Session PT - End of Session Activity Tolerance: Patient limited by fatigue Patient left: in bed;with call bell/phone within reach  GP     [name], MPT Pager: [phone]

## 2013-05-05 NOTE — Progress Notes (Addendum)
[facility] PROGRESS NOTE  Ellen Chandler EXB:[id number] DOB: Sep 13, 1930 DOA: 05/04/2013 PCP: Thayer Headings, MD  1 month hx of worsening shortness of breath with ambulation. For the past 1 week she started to have heavy breathing with minimal exertion and severe dry cough. She denies any fever. She has been drinking large amount of water in attempt to help with the cough. But lately she has not been eating well. Yesterday she started to feel lightheaded. She was evaluted in ER and was severely hypotensive in the waiting room down to 58/40. After getting IVF her blood pressure improved to 123/63.    Assessment/Plan:  CAP Patient with severe cough, weakness, anorexia No vomiting, diarrhea or fever Blood cultures pending Influenza PCR pending Azith and Rocephin started. Nebs, mucinex, delsym Supportive care.  Volume overload with hyponatremia As evidenced on CXR. Urine osmols pending. Will give 1 dose of IV lasix. Patient with no history of CHF 2D echo pending. Saline lock fluids, Is&Os, daily weights, heart healthy diet.  Hypotension in the ED. Likely related to taking BP medications during illness when BP was already low. Hyzaar and amlodipine being held. BP stabilized.  Hypothyroid Continue levothyroxine. TSH pending.  POC T elevated. Mildly elevated. No chest pain Subsequent T1 wnl Check follow up ekg.   DVT Prophylaxis:  lovenox  Code Status: full Family Communication: patient and son Disposition Plan: return to independent living at [facility] when able.   HPI/Subjective: Normally walks independently.  Sees Dr. Marshell Garfinkel.  Complains of cough and fatigue.  Seems to get bronchitis every year.  Objective: Filed Vitals:   05/05/13 0245 05/05/13 0300 05/05/13 0536 05/05/13 0622  BP: 105/55 115/60 123/57 147/70  Pulse: 70 70 72 77  Temp:   98 F (36.7 C) 98.5 F (36.9 C)  TempSrc:   Oral Oral  Resp: 13 14 16 22   Height:    5\' 5"  (1.651 m)   Weight:    86.8 kg (191 lb 5.8 oz)  SpO2: 100% 99% 99% 97%    Intake/Output Summary (Last 24 hours) at 05/05/13 1027 Last data filed at 05/05/13 0700  Gross per 24 hour  Intake  56.67 ml  Output      0 ml  Net  56.67 ml   Filed Weights   05/04/13 2341 05/05/13 0622  Weight: 72.576 kg (160 lb) 86.8 kg (191 lb 5.8 oz)    Exam: General: Well developed, over weight, well nourished, NAD, appears stated age .  On bedside commode. HEENT:  PERR, EOMI, Anicteic Sclera, MMM. No pharyngeal erythema or exudates  Neck: Supple, no JVD, no masses  Cardiovascular: RRR, S1 S2 auscultated, no rubs, murmurs or gallops.   Respiratory: decreased breath sounds, minimal rals bilaterally Abdomen: Soft, nontender, nondistended, + bowel sounds  Extremities: warm dry without cyanosis clubbing or edema.  Neuro: AAOx3, cranial nerves grossly intact. Strength 5/5 in upper and lower extremities  Skin: Without rashes exudates or nodules.   Psych: Normal affect and demeanor with intact judgement and insight       Data Reviewed: Basic Metabolic Panel:  Recent Labs Lab 05/05/13 0005 05/05/13 0030 05/05/13 0813  NA 123* 124*  --   K 4.1 3.9  --   CL 86* 90*  --   CO2 22  --   --   GLUCOSE 113* 112*  --   BUN 18 17  --   CREATININE 0.97 1.20* 0.86  CALCIUM 9.2  --   --    Liver Function Tests:  Recent Labs Lab 05/05/13 0005  AST 22  ALT 17  ALKPHOS 49  BILITOT 0.3  PROT 6.4  ALBUMIN 3.6   CBC:  Recent Labs Lab 05/05/13 0005 05/05/13 0030 05/05/13 0813  WBC 9.8  --  8.0  NEUTROABS 4.7  --   --   HGB 12.4 12.6 11.4*  HCT 34.9* 37.0 32.5*  MCV 88.4  --  89.3  PLT 420*  --  344   Cardiac Enzymes:  Recent Labs Lab 05/05/13 0005 05/05/13 0813  TROPONINI <0.30 <0.30   CBG:  Recent Labs Lab 05/04/13 2349  GLUCAP 116*      Studies: Ct Head Wo Contrast  05/05/2013   CLINICAL DATA:  Cough for about 2 weeks. Headache. Dizziness. Diaphoresis.  EXAM: CT HEAD WITHOUT  CONTRAST  TECHNIQUE: Contiguous axial images were obtained from the base of the skull through the vertex without intravenous contrast.  COMPARISON:  None.  FINDINGS: Diffuse cerebral atrophy. No significant ventricular dilatation. Patchy low-attenuation changes in the deep white matter consistent with small vessel ischemic change. No mass effect or midline shift. No abnormal extra-axial fluid collections. Gray-white matter junctions are distinct. Basal cisterns are not effaced. No evidence of acute intracranial hemorrhage. No depressed skull fractures. Visualized paranasal sinuses and mastoid air cells are not opacified.  IMPRESSION: No acute intracranial abnormalities. Chronic atrophy and small vessel ischemic changes.   Electronically Signed   By: Burman Nieves M.D.   On: 05/05/2013 01:00   Dg Chest Portable 1 View  05/05/2013   CLINICAL DATA:  Shortness of breath and dizziness.  EXAM: PORTABLE CHEST - 1 VIEW  COMPARISON:  Chest radiograph February 18, 2013  FINDINGS: Cardiac silhouette appears mildly enlarged. Low inspiratory examination with crowded vasculature markings, mild central pulmonary vasculature congestion. Small left pleural effusion. Strandy densities in left lung base. No pneumothorax.  Multiple EKG lines overlie the patient and may obscure subtle underlying pathology. Degenerative changes of the cervical spine.  IMPRESSION: Mild cardiomegaly and central pulmonary vasculature congestion. Small left pleural effusion with left lower lobe atelectasis, less likely pneumonia. Consider follow-up PA and lateral views of the chest after treatment to verify improvement.   Electronically Signed   By: Awilda Metro   On: 05/05/2013 00:38    Scheduled Meds: . azithromycin  500 mg Oral Daily  . cefTRIAXone (ROCEPHIN)  IV  1 g Intravenous Q24H  . dextromethorphan  30 mg Oral BID  . enoxaparin (LOVENOX) injection  40 mg Subcutaneous Q24H  . furosemide  20 mg Intravenous Once  . guaiFENesin  600  mg Oral BID  . guaiFENesin  600 mg Oral BID  . ipratropium  0.5 mg Nebulization Q6H  . levothyroxine  88 mcg Oral QAC breakfast  . pantoprazole  40 mg Oral Daily   Continuous Infusions:    Active Problems:   Dehydration   CAP (community acquired pneumonia)   Orthostatic hypotension   Hyponatremia    Conley Canal  [facility] Pager [phone]. If 7PM-7AM, please contact night-coverage at [email or web address], password [facility] 05/05/2013, 9:42 AM  LOS: 1 day

## 2013-05-05 NOTE — ED Notes (Addendum)
POCT CG4 given to Dr. Opitz. 

## 2013-05-05 NOTE — Progress Notes (Signed)
Echocardiogram 2D Echocardiogram has been performed.  Ellen Chandler 05/05/2013, 2:38 PM

## 2013-05-05 NOTE — H&P (Signed)
PCP: Valentino Saxon   Chief Complaint:  cough  HPI: Ellen Chandler is a 79 y.o. female   has a past medical history of Hypertension and Hypothyroidism.   Presented with  1 month hx of worsening shortness of breath with ambulation. For the past 1 week she started to have heavy breathing with minimal exertion and severe dry cough. She denies any fever. She has been drinking large amount of water in attempt to help with the cough. But lately she has not been eating well. Yesterday she started to feel lightheaded. She was evaluted in ER and was severely hypotensive in the waiting room down to 58/40. After getting IVF her blood pressure improved to 123/63.   Review of Systems:    Pertinent positives include: non-productive cough,   Constitutional:  No weight loss, night sweats, Fevers, chills, fatigue, weight loss  HEENT:  No headaches, Difficulty swallowing,Tooth/dental problems,Sore throat,  No sneezing, itching, ear ache, nasal congestion, post nasal drip,  Cardio-vascular:  No chest pain, Orthopnea, PND, anasarca, dizziness, palpitations. no Bilateral lower extremity swelling  GI:  No heartburn, indigestion, abdominal pain, nausea, vomiting, diarrhea, change in bowel habits, loss of appetite, melena, blood in stool, hematemesis Resp:  no shortness of breath at rest. No dyspnea on exertion, No excess mucus, no productive cough, NoNo coughing up of blood.No change in color of mucus.No wheezing. Skin:  no rash or lesions. No jaundice GU:  no dysuria, change in color of urine, no urgency or frequency. No straining to urinate.  No flank pain.  Musculoskeletal:  No joint pain or no joint swelling. No decreased range of motion. No back pain.  Psych:  No change in mood or affect. No depression or anxiety. No memory loss.  Neuro: no localizing neurological complaints, no tingling, no weakness, no double vision, no gait abnormality, no slurred speech, no confusion  Otherwise ROS are  negative except for above, 10 systems were reviewed  Past Medical History: Past Medical History  Diagnosis Date  . Hypertension   . Hypothyroidism    Past Surgical History  Procedure Laterality Date  . Joint replacement      knee left     Medications: Prior to Admission medications   Medication Sig Start Date End Date Taking? Authorizing Provider  amLODipine (NORVASC) 5 MG tablet Take 5 mg by mouth daily.   Yes Historical Provider, MD  levothyroxine (SYNTHROID, LEVOTHROID) 88 MCG tablet Take 88 mcg by mouth daily before breakfast.   Yes Historical Provider, MD  losartan-hydrochlorothiazide (HYZAAR) 50-12.5 MG per tablet Take 1 tablet by mouth daily.   Yes Historical Provider, MD  nabumetone (RELAFEN) 500 MG tablet Take 500 mg by mouth 2 (two) times daily.   Yes Historical Provider, MD  omeprazole (PRILOSEC) 20 MG capsule Take 20 mg by mouth 2 (two) times daily before a meal.   Yes Historical Provider, MD    Allergies:  No Known Allergies  Social History:  Ambulatory  independently  Lives at [facility] living   reports that she has never smoked. She has never used smokeless tobacco. She reports that she does not drink alcohol or use illicit drugs.   Family History: family history includes Breast cancer in her sister; Diabetes type II in her father.    Physical Exam: Patient Vitals for the past 24 hrs:  BP Temp Temp src Pulse Resp SpO2 Height Weight  05/05/13 0300 115/60 mmHg - - 70 14 99 % - -  05/05/13 0245 105/55 mmHg - -  70 13 100 % - -  05/05/13 0230 123/62 mmHg - - 71 15 100 % - -  05/05/13 0215 100/61 mmHg - - 71 13 100 % - -  05/05/13 0200 112/64 mmHg - - 67 29 97 % - -  05/05/13 0145 110/63 mmHg - - 76 25 100 % - -  05/05/13 0130 99/66 mmHg - - 73 18 100 % - -  05/05/13 0127 - 97.8 F (36.6 C) Oral - - - - -  05/05/13 0115 121/58 mmHg - - 72 15 100 % - -  05/05/13 0100 115/66 mmHg - - 74 18 100 % - -  05/05/13 0045 110/58 mmHg - - 75 15  100 % - -  05/05/13 0015 107/61 mmHg - - 74 14 98 % - -  05/05/13 0005 96/53 mmHg - - 74 20 97 % - -  05/05/13 0000 82/59 mmHg 98.2 F (36.8 C) Rectal 77 21 96 % - -  05/04/13 2355 94/56 mmHg - - 77 24 93 % - -  05/04/13 2341 58/40 mmHg 97.7 F (36.5 C) Oral 61 18 97 % 5\' 5"  (1.651 m) 72.576 kg (160 lb)    1. General:  in No Acute distress 2. Psychological: Alert and Oriented 3. Head/ENT:    Dry Mucous Membranes                          Head Non traumatic, neck supple                          Normal Dentition 4. SKIN:  decreased Skin turgor,  Skin clean Dry and intact no rash 5. Heart: Regular rate and rhythm no Murmur, Rub or gallop 6. Lungs: occasional wheezes mild crackles   7. Abdomen: Soft, non-tender, Non distended, obese 8. Lower extremities: no clubbing, cyanosis, or edema 9. Neurologically Grossly intact, moving all 4 extremities equally 10. MSK: Normal range of motion  body mass index is 26.63 kg/(m^2).   Labs on Admission:   Recent Labs  05/05/13 0005 05/05/13 0030  NA 123* 124*  K 4.1 3.9  CL 86* 90*  CO2 22  --   GLUCOSE 113* 112*  BUN 18 17  CREATININE 0.97 1.20*  CALCIUM 9.2  --     Recent Labs  05/05/13 0005  AST 22  ALT 17  ALKPHOS 49  BILITOT 0.3  PROT 6.4  ALBUMIN 3.6   No results found for this basename: LIPASE, AMYLASE,  in the last 72 hours  Recent Labs  05/05/13 0005 05/05/13 0030  WBC 9.8  --   NEUTROABS 4.7  --   HGB 12.4 12.6  HCT 34.9* 37.0  MCV 88.4  --   PLT 420*  --     Recent Labs  05/05/13 0005  TROPONINI <0.30   No results found for this basename: TSH, T4TOTAL, FREET3, T3FREE, THYROIDAB,  in the last 72 hours No results found for this basename: VITAMINB12, FOLATE, FERRITIN, TIBC, IRON, RETICCTPCT,  in the last 72 hours No results found for this basename: HGBA1C    Estimated Creatinine Clearance: 37.3 ml/min (by C-G formula based on Cr of 1.2). ABG    Component Value Date/Time   TCO2 23 05/05/2013 0030      No results found for this basename: DDIMER     Other results:  I have pearsonaly reviewed this: ECG REPORT  Rate: 76  Rhythm: NSR  ST&T Change: T wave inversion in V2-v4  UA no evidence of UTI 1.023    Cultures: No results found for this basename: sdes, specrequest, cult, reptstatus       Radiological Exams on Admission: Ct Head Wo Contrast  05/05/2013   CLINICAL DATA:  Cough for about 2 weeks. Headache. Dizziness. Diaphoresis.  EXAM: CT HEAD WITHOUT CONTRAST  TECHNIQUE: Contiguous axial images were obtained from the base of the skull through the vertex without intravenous contrast.  COMPARISON:  None.  FINDINGS: Diffuse cerebral atrophy. No significant ventricular dilatation. Patchy low-attenuation changes in the deep white matter consistent with small vessel ischemic change. No mass effect or midline shift. No abnormal extra-axial fluid collections. Gray-white matter junctions are distinct. Basal cisterns are not effaced. No evidence of acute intracranial hemorrhage. No depressed skull fractures. Visualized paranasal sinuses and mastoid air cells are not opacified.  IMPRESSION: No acute intracranial abnormalities. Chronic atrophy and small vessel ischemic changes.   Electronically Signed   By: Burman Nieves M.D.   On: 05/05/2013 01:00   Dg Chest Portable 1 View  05/05/2013   CLINICAL DATA:  Shortness of breath and dizziness.  EXAM: PORTABLE CHEST - 1 VIEW  COMPARISON:  Chest radiograph February 18, 2013  FINDINGS: Cardiac silhouette appears mildly enlarged. Low inspiratory examination with crowded vasculature markings, mild central pulmonary vasculature congestion. Small left pleural effusion. Strandy densities in left lung base. No pneumothorax.  Multiple EKG lines overlie the patient and may obscure subtle underlying pathology. Degenerative changes of the cervical spine.  IMPRESSION: Mild cardiomegaly and central pulmonary vasculature congestion. Small left pleural effusion with  left lower lobe atelectasis, less likely pneumonia. Consider follow-up PA and lateral views of the chest after treatment to verify improvement.   Electronically Signed   By: Awilda Metro   On: 05/05/2013 00:38    Chart has been reviewed  Assessment/Plan  80 year old female with history of hypertension presents with episode of severe hypotension in the setting of orthostasis. With history of cough for past one week and shortness of breath for past one month  Present on Admission:  . CAP (community acquired pneumonia) - chest x-ray worrisome for possible infiltrate given history of coughing for past one week of worsening shortness of breath we'll treat for presumptive community-acquired pneumonia. Will repeat chest x-ray to see if there is a true infiltrate obtain influenza PCR blood cultures given severe hypotension  . Dehydration - given aggressive IV fluid resuscitation given severe initial hypotension now improving  . Orthostatic hypotension - due to dehydration presumptively Hyponatremia likely secondary to dehydration but also patient had been taking a large amount of plain water. Will check electrolytes and a.m. cortisol level.  Prophylaxis:   Lovenox, Protonix  CODE STATUS: FULL CODE  Other plan as per orders.  I have spent a total of 55 min on this admission  Denys Labree 05/05/2013, 4:25 AM

## 2013-05-05 NOTE — ED Notes (Signed)
I stat troponin given to Dr. Dierdre Highman

## 2013-05-05 NOTE — Progress Notes (Signed)
Called for report on patient at 5:26 AM, was put on hold for 5 minutes. Called back and gave secretary my number so that ED RN can call me back with report when he/she is available.

## 2013-05-06 ENCOUNTER — Inpatient Hospital Stay (HOSPITAL_COMMUNITY): Payer: Medicare Other

## 2013-05-06 ENCOUNTER — Encounter (HOSPITAL_COMMUNITY): Payer: Self-pay | Admitting: Nurse Practitioner

## 2013-05-06 DIAGNOSIS — I10 Essential (primary) hypertension: Secondary | ICD-10-CM

## 2013-05-06 DIAGNOSIS — E871 Hypo-osmolality and hyponatremia: Secondary | ICD-10-CM

## 2013-05-06 DIAGNOSIS — I5031 Acute diastolic (congestive) heart failure: Secondary | ICD-10-CM

## 2013-05-06 DIAGNOSIS — J209 Acute bronchitis, unspecified: Principal | ICD-10-CM

## 2013-05-06 DIAGNOSIS — J9600 Acute respiratory failure, unspecified whether with hypoxia or hypercapnia: Secondary | ICD-10-CM | POA: Diagnosis not present

## 2013-05-06 DIAGNOSIS — I959 Hypotension, unspecified: Secondary | ICD-10-CM | POA: Diagnosis not present

## 2013-05-06 DIAGNOSIS — J189 Pneumonia, unspecified organism: Secondary | ICD-10-CM | POA: Diagnosis not present

## 2013-05-06 DIAGNOSIS — R9431 Abnormal electrocardiogram [ECG] [EKG]: Secondary | ICD-10-CM | POA: Diagnosis not present

## 2013-05-06 DIAGNOSIS — J9819 Other pulmonary collapse: Secondary | ICD-10-CM | POA: Diagnosis not present

## 2013-05-06 LAB — CORTISOL-AM, BLOOD: Cortisol - AM: 19 ug/dL (ref 4.3–22.4)

## 2013-05-06 LAB — COMPREHENSIVE METABOLIC PANEL
ALT: 16 U/L (ref 0–35)
AST: 18 U/L (ref 0–37)
Albumin: 3.1 g/dL — ABNORMAL LOW (ref 3.5–5.2)
Alkaline Phosphatase: 42 U/L (ref 39–117)
CO2: 26 mEq/L (ref 19–32)
Chloride: 91 mEq/L — ABNORMAL LOW (ref 96–112)
GFR calc Af Amer: 77 mL/min — ABNORMAL LOW (ref >90)
GFR calc non Af Amer: 67 mL/min — ABNORMAL LOW (ref >90)
Glucose, Bld: 91 mg/dL (ref 70–99)
Potassium: 3.8 mEq/L (ref 3.7–5.3)
Sodium: 129 mEq/L — ABNORMAL LOW (ref 137–147)
Total Protein: 5.7 g/dL — ABNORMAL LOW (ref 6.0–8.3)

## 2013-05-06 LAB — CBC WITH DIFFERENTIAL/PLATELET
Basophils Absolute: 0 10*3/uL (ref 0.0–0.1)
Basophils Relative: 1 % (ref 0–1)
Eosinophils Absolute: 0.3 10*3/uL (ref 0.0–0.7)
HCT: 31.4 % — ABNORMAL LOW (ref 36.0–46.0)
MCH: 31.2 pg (ref 26.0–34.0)
MCHC: 34.4 g/dL (ref 30.0–36.0)
Monocytes Absolute: 1 10*3/uL (ref 0.1–1.0)
Neutro Abs: 2.1 10*3/uL (ref 1.7–7.7)
Neutrophils Relative %: 40 % — ABNORMAL LOW (ref 43–77)
RDW: 12.4 % (ref 11.5–15.5)
WBC: 5.2 10*3/uL (ref 4.0–10.5)

## 2013-05-06 LAB — OSMOLALITY, URINE: Osmolality, Ur: 301 mOsm/kg — ABNORMAL LOW (ref 390–1090)

## 2013-05-06 MED ORDER — ALBUTEROL SULFATE (2.5 MG/3ML) 0.083% IN NEBU: 2.5000 mg | INHALATION_SOLUTION | Freq: Four times a day (QID) | RESPIRATORY_TRACT | Status: DC | PRN

## 2013-05-06 MED ORDER — FUROSEMIDE 20 MG PO TABS
20.0000 mg | ORAL_TABLET | Freq: Every day | ORAL | Status: DC
  Administered 2013-05-06 – 2013-05-07 (×2): 20 mg via ORAL
  Filled 2013-05-06 (×2): qty 1

## 2013-05-06 MED ORDER — LEVALBUTEROL HCL 0.63 MG/3ML IN NEBU
0.6300 mg | INHALATION_SOLUTION | Freq: Two times a day (BID) | RESPIRATORY_TRACT | Status: DC
  Administered 2013-05-06 – 2013-05-07 (×3): 0.63 mg via RESPIRATORY_TRACT
  Filled 2013-05-06 (×4): qty 3

## 2013-05-06 MED ORDER — BENZONATATE 200 MG PO CAPS: 200.0000 mg | ORAL_CAPSULE | Freq: Three times a day (TID) | ORAL | Status: DC | PRN

## 2013-05-06 MED ORDER — AMLODIPINE BESYLATE 2.5 MG PO TABS
2.5000 mg | ORAL_TABLET | Freq: Every day | ORAL | Status: DC
  Administered 2013-05-06 – 2013-05-07 (×2): 2.5 mg via ORAL
  Filled 2013-05-06 (×2): qty 1

## 2013-05-06 NOTE — Discharge Summary (Signed)
Physician Discharge Summary  Caitlynn Ju ZOX:[id number] DOB: 06/19/1930 DOA: 05/04/2013  PCP: Thayer Headings, MD  Admit date: 05/04/2013 Discharge date: 05/07/13 Recommendations for Outpatient Follow-up:  1. Pt will need to follow up with PCP in 1 week post discharge 2. Please obtain BMP to evaluate electrolytes and kidney function 3. Please also check CBC to evaluate Hg and Hct levels  Discharge Diagnoses:  Active Problems:   Orthostatic hypotension   Hyponatremia   Nonspecific abnormal electrocardiogram (ECG) (EKG)   Acute bronchitis   Unspecified essential hypertension   Acute diastolic CHF (congestive heart failure) Acute Diastolic CHF  -Concerned about angina equivalent especially with abnormal EKG with ST-T wave changes in the lateral leads  -Appreciate cardiology-->continue diuresis and arrange for outpt Lexiscan -Patient is breathing better after one dose of intravenous furosemide  -continued furosemide -Echo--EF 60-65%, grade 1 diastolic dysfunction, no WMA  -Suspect she had a degree of fluid overload  -start ASA -neg 4 liters for the admission -troponin neg x 4 -proBNP 596 on day of d/c Acute bronchitis  -procalcitonin <0.10  - Switch to oral antibiotics  -Not convinced the patient has CAP with no fever or leukocytosis -She will be sent home with 2  additional days of azithromycin which will complete 5 days of therapy total.  Hyponatremia  -Suspect hypervolemic Hyponatremia  -Continue diuresis  Bronchospasm  -start short acting beta agonist  -antitussive  HTN  -continue amlodipine2.5 mg per cardiology -d/c ARB altogether Hypothyroidism  -continue synthroid  -TSH 3.737, FreeT4-1.48 Family Communication: Pt at beside  Disposition Plan: independent living when cleared by cardiology Antibiotics:  Ceftriaxone 05/05/2013>>>05/06/13  Azithromycin 05/05/2013>>>  Brief history  79 year old female with a history of hypertension and hypothyroidism presents  with 1 month hx of worsening shortness of breath with ambulation. For the past 1 week she started to have heavy breathing with minimal exertion and severe dry cough. She denies any fever. She has been drinking large amount of water in attempt to help with the cough. On the day prior to admission, she started to feel lightheaded. She was evaluted in ER and was severely hypotensive in the waiting room down to 58/40 while she was in the waiting room. After getting IVF her blood pressure improved to 123/63. The patient was started on IV antibiotics initially. These were changed over to oral antibiotics. Further evaluation revealed an abnormal EKG with cone of vascular congestion consistent with acute diastolic CHF. The patient was given intravenous furosemide. This was transitioned to oral furosemide. Cardiology was consulted. They agreed with continued furosemide and stated that the patient will need an outpatient LexiScan stress test at some point.  Discharge Condition: Stable  Disposition:  Follow-up Information   Follow up with Tobias Alexander, H, MD In 1 week.   Specialty:  Cardiology   Contact information:   [address] [place] [address] [phone]      discharge home  Diet: Heart healthy Wt Readings from Last 3 Encounters:  05/07/13 83.4 kg (183 lb 13.8 oz)     Consultants: cardiology  Discharge Exam: Filed Vitals:   05/07/13 0500  BP: 132/71  Pulse: 80  Temp: 98.6 F (37 C)  Resp: 16   Filed Vitals:   05/06/13 1306 05/06/13 2150 05/07/13 0500 05/07/13 1010  BP: 148/63 144/79 132/71   Pulse: 81 82 80   Temp: 97.4 F (36.3 C) 98.3 F (36.8 C) 98.6 F (37 C)   TempSrc: Oral Oral Oral   Resp: 18 20 16  Height:      Weight:   83.4 kg (183 lb 13.8 oz)   SpO2: 99% 98% 99% 97%   General: A&O x 3, NAD, pleasant, cooperative Cardiovascular: RRR, no rub, no gallop, no S3 Respiratory: Fine bibasilar crackles. No wheezing. Good air  movement. Abdomen:soft, nontender, nondistended, positive bowel sounds Extremities: No edema, No lymphangitis, no petechiae  Discharge Instructions      Discharge Orders   Future Appointments Provider Department Dept Phone   06/05/2013 11:00 AM [id number] [place] [facility] [phone]   Please wear two piece clothing and wear no powder or deodorant. Please arrive 15 minutes early prior to your appointment time.   Future Orders Complete By Expires   Diet - low sodium heart healthy  As directed    Increase activity slowly  As directed        Medication List    STOP taking these medications       losartan-hydrochlorothiazide 50-12.5 MG per tablet  Commonly known as:  HYZAAR     nabumetone 500 MG tablet  Commonly known as:  RELAFEN      TAKE these medications       amLODipine 2.5 MG tablet  Commonly known as:  NORVASC  Take 1 tablet (2.5 mg total) by mouth daily.     aspirin EC 81 MG tablet  Take 1 tablet (81 mg total) by mouth daily.     azithromycin 500 MG tablet  Commonly known as:  ZITHROMAX  Take 1 tablet (500 mg total) by mouth daily.     benzonatate 200 MG capsule  Commonly known as:  TESSALON  Take 1 capsule (200 mg total) by mouth 3 (three) times daily as needed for cough.     furosemide 20 MG tablet  Commonly known as:  LASIX  Take 1 tablet (20 mg total) by mouth daily.     levothyroxine 88 MCG tablet  Commonly known as:  SYNTHROID, LEVOTHROID  Take 88 mcg by mouth daily before breakfast.     omeprazole 20 MG capsule  Commonly known as:  PRILOSEC  Take 20 mg by mouth 2 (two) times daily before a meal.         The results of significant diagnostics from this hospitalization (including imaging, microbiology, ancillary and laboratory) are listed below for reference.    Significant Diagnostic Studies: Dg Chest 2 View  05/06/2013   CLINICAL DATA:  Pneumonia.  Hypertension.  EXAM: CHEST  2 VIEW  COMPARISON:  05/05/2013.   FINDINGS: Mild basilar atelectasis on the left noted. No pleural effusion or pneumothorax. Mediastinum and hilar structures are unremarkable. Stable borderline cardiomegaly with normal pulmonary vascularity. Sliding hiatal hernia.  IMPRESSION: 1. Mild atelectatic changes left lung base. 2. Sliding hiatal hernia.   Electronically Signed   By: Maisie Fus  Register   On: 05/06/2013 08:48   Ct Head Wo Contrast  05/05/2013   CLINICAL DATA:  Cough for about 2 weeks. Headache. Dizziness. Diaphoresis.  EXAM: CT HEAD WITHOUT CONTRAST  TECHNIQUE: Contiguous axial images were obtained from the base of the skull through the vertex without intravenous contrast.  COMPARISON:  None.  FINDINGS: Diffuse cerebral atrophy. No significant ventricular dilatation. Patchy low-attenuation changes in the deep white matter consistent with small vessel ischemic change. No mass effect or midline shift. No abnormal extra-axial fluid collections. Gray-white matter junctions are distinct. Basal cisterns are not effaced. No evidence of acute intracranial hemorrhage. No depressed skull fractures. Visualized paranasal sinuses and mastoid air  cells are not opacified.  IMPRESSION: No acute intracranial abnormalities. Chronic atrophy and small vessel ischemic changes.   Electronically Signed   By: Burman Nieves M.D.   On: 05/05/2013 01:00   Dg Chest Portable 1 View  05/05/2013   CLINICAL DATA:  Shortness of breath and dizziness.  EXAM: PORTABLE CHEST - 1 VIEW  COMPARISON:  Chest radiograph February 18, 2013  FINDINGS: Cardiac silhouette appears mildly enlarged. Low inspiratory examination with crowded vasculature markings, mild central pulmonary vasculature congestion. Small left pleural effusion. Strandy densities in left lung base. No pneumothorax.  Multiple EKG lines overlie the patient and may obscure subtle underlying pathology. Degenerative changes of the cervical spine.  IMPRESSION: Mild cardiomegaly and central pulmonary vasculature  congestion. Small left pleural effusion with left lower lobe atelectasis, less likely pneumonia. Consider follow-up PA and lateral views of the chest after treatment to verify improvement.   Electronically Signed   By: Awilda Metro   On: 05/05/2013 00:38     Microbiology: Recent Results (from the past 240 hour(s))  CULTURE, BLOOD (ROUTINE X 2)     Status: None   Collection Time    05/05/13  8:00 AM      Result Value Range Status   Specimen Description BLOOD RIGHT ARM   Final   Special Requests BOTTLES DRAWN AEROBIC AND ANAEROBIC 5CC EACH   Final   Culture  Setup Time     Final   Value: 05/05/2013 10:38     Performed at [facility]   Culture     Final   Value:        BLOOD CULTURE RECEIVED NO GROWTH TO DATE CULTURE WILL BE HELD FOR 5 DAYS BEFORE ISSUING A FINAL NEGATIVE REPORT     Performed at [facility]   Report Status PENDING   Incomplete  CULTURE, BLOOD (ROUTINE X 2)     Status: None   Collection Time    05/05/13  8:13 AM      Result Value Range Status   Specimen Description BLOOD RIGHT ARM   Final   Special Requests BOTTLES DRAWN AEROBIC AND ANAEROBIC 5CC EACH   Final   Culture  Setup Time     Final   Value: 05/05/2013 10:38     Performed at [facility]   Culture     Final   Value:        BLOOD CULTURE RECEIVED NO GROWTH TO DATE CULTURE WILL BE HELD FOR 5 DAYS BEFORE ISSUING A FINAL NEGATIVE REPORT     Performed at [facility]   Report Status PENDING   Incomplete     Labs: Basic Metabolic Panel:  Recent Labs Lab 05/05/13 0005 05/05/13 0030 05/05/13 0813 05/06/13 0504 05/07/13 0403  NA 123* 124*  --  129* 131*  K 4.1 3.9  --  3.8 3.7  CL 86* 90*  --  91* 92*  CO2 22  --   --  26 29  GLUCOSE 113* 112*  --  91 78  BUN 18 17  --  11 15  CREATININE 0.97 1.20* 0.86 0.81 0.83  CALCIUM 9.2  --   --  8.4 8.9   Liver Function Tests:  Recent Labs Lab 05/05/13 0005 05/06/13 0504  AST 22 18  ALT 17 16  ALKPHOS 49 42   BILITOT 0.3 0.2*  PROT 6.4 5.7*  ALBUMIN 3.6 3.1*   No results found for this basename: LIPASE, AMYLASE,  in the last  168 hours No results found for this basename: AMMONIA,  in the last 168 hours CBC:  Recent Labs Lab 05/05/13 0005 05/05/13 0030 05/05/13 0813 05/06/13 0504 05/07/13 0403  WBC 9.8  --  8.0 5.2 5.6  NEUTROABS 4.7  --   --  2.1  --   HGB 12.4 12.6 11.4* 10.8* 11.9*  HCT 34.9* 37.0 32.5* 31.4* 34.9*  MCV 88.4  --  89.3 90.8 91.6  PLT 420*  --  344 309 350   Cardiac Enzymes:  Recent Labs Lab 05/05/13 0005 05/05/13 0813 05/05/13 1430 05/05/13 2023  TROPONINI <0.30 <0.30 <0.30 <0.30   BNP: No components found with this basename: POCBNP,  CBG:  Recent Labs Lab 05/04/13 2349  GLUCAP 116*    Time coordinating discharge:  Greater than 30 minutes  Signed:  Jayde Mcallister, DO [facility] Pager: [phone] 05/07/2013, 1:11 PM

## 2013-05-06 NOTE — Progress Notes (Signed)
Physical Therapy Treatment Patient Details Name: Ellen Chandler MRN: [id number] DOB: 1931/04/27 Today's Date: 05/06/2013 Time: 4098-1191 PT Time Calculation (min): 11 min  PT Assessment / Plan / Recommendation  History of Present Illness 79 yo female admitted with Pna, SOB, dizziness, hypotension. Pt is from [employer].    PT Comments   Progressing with mobility. Still some gait and balance deficits at this time. Recommend use of walker. Pt states she has access to walker at home. Also encouraged pt to ambulate with nursing with walker to increase mobilization. Recommend HHPT.   Follow Up Recommendations  Home health PT     Does the patient have the potential to tolerate intense rehabilitation     Barriers to Discharge        Equipment Recommendations  None recommended by PT    Recommendations for Other Services OT consult  Frequency Min 3X/week   Progress towards PT Goals Progress towards PT goals: Progressing toward goals  Plan Current plan remains appropriate    Precautions / Restrictions Precautions Precautions: Fall Restrictions Weight Bearing Restrictions: No   Pertinent Vitals/Pain No c/o pain    Mobility  Bed Mobility Bed Mobility: Supine to Sit Supine to Sit: 6: Modified independent (Device/Increase time);With rails;HOB elevated Transfers Transfers: Sit to Stand;Stand to Sit Sit to Stand: 4: Min guard;From toilet Stand to Sit: 4: Min guard;To chair/3-in-1;To toilet Details for Transfer Assistance: VCs safety, technique, hand placement. clsoe guard for safety Ambulation/Gait Ambulation/Gait Assistance: 4: Min assist Ambulation Distance (Feet): 175 Feet Ambulation/Gait Assistance Details: LOB x1 with attempt to ambulate without support-Min assist to correct. Had pt use IV pole for 1 hand support. Slow gait speed. Dyspnea 2/4. Fatigues fairly easily.  Gait Pattern: Step-through pattern;Decreased stride length    Exercises     PT Diagnosis:     PT Problem List:   PT Treatment Interventions:     PT Goals (current goals can now be found in the care plan section)    Visit Information  Last PT Received On: 05/06/13 Assistance Needed: +1 History of Present Illness: 79 yo female admitted with Pna, SOB, dizziness, hypotension. Pt is from [employer].     Subjective Data      Cognition  Cognition Arousal/Alertness: Awake/Chandler Behavior During Therapy: WFL for tasks assessed/performed Overall Cognitive Status: Within Functional Limits for tasks assessed    Balance     End of Session PT - End of Session Equipment Utilized During Treatment: Gait belt Activity Tolerance: Patient tolerated treatment well Patient left: in chair;with call bell/phone within reach   GP     Ellen Chandler, MPT Pager: [phone]

## 2013-05-06 NOTE — Consult Note (Signed)
CARDIOLOGY CONSULT NOTE   Patient ID: Ellen Chandler MRN: [id number], DOB/AGE: May 05, 1931   Admit date: 05/04/2013 Date of Consult: 05/06/2013  Primary Physician: Thayer Headings, MD Primary Cardiologist: new to  - seen by Eloy End, MD   Pt. Profile  79 y/o female w/o prior cardiac hx who was admitted with dyspnea, resp infxn, and volume overload.  Problem List  Past Medical History  Diagnosis Date  . Hypertension   . Hypothyroidism   . Diastolic dysfunction     a. 04/2013 Echo: EF 60-65%, No rwma, Gr 1 DD, mild MR.  Marland Kitchen GERD (gastroesophageal reflux disease)   . Abnormal ECG     a. Pt aware of long hx of abnl ecg->h/o normal stress testing in [place] ~ 2000.    Past Surgical History  Procedure Laterality Date  . Joint replacement      a. knee left - injury resulted after being attacked by a dog.    Allergies  No Known Allergies  HPI   79 y/o female with a h/o HTN and hypothyroidism.  She says that she has also been told for many years that she has an abnormal ECG.  She underwent stress testing in ~ 2000 secondary to abnl ECG and this was reportedly normal.  After her husband died in November 06, 2022 of this year, she moved from [place] to [place], to be closer to her son.  She is currently living at [facility] retirement facility, where she's been exercising daily up until a week or so ago.  She notes that over the past 6 mos, she has been experiencing a progressive non-productive cough and also DOE.  She has never had chest pain.  Upon establishing primary care f/u in [place] about 6 mos ago, she was dx with htn and placed on amlodipine and losartan-HCTZ.  She thinks that it was around that time that the cough began.  She saw her PCP in October and was referred for an outpt ETT @ the [facility] office on 11/11.  She ended up cancelling this because she was coughing particularly badly that day and she didn't think she'd be able to complete the  test.  She never rescheduled b/c she doesn't feel that she would be able to walk on a treadmill.  Over the past week to 10 days, her coughing and DOE have worsened.  She has also been noticing malaise, wkns, fatigue, orthopnea, and worsening appetite.  She denies lower extremity edema, increase in abdominal girth, fevers, or chills.  Because of progressive Ss, her son brought her to the ER late on the evening of the 29th.  Upon standing up in the waiting room, she became very lightheaded and presyncopal.  She did not experience n, v, diaphoresis, chest pain, or dyspnea.  She was triaged and found to be hypotensive with a BP of 58/40.  She was quickly treated with IVF and BP improved.  She was admitted by internal medicine and treated for possible pna, however she has never been febrile and cxr never clearly showed this.  BC neg x 2.  Influenza PCR/H1N1 neg.  CXR did show vasc congestion and yesterday she was given IV lasix with good response and -2.8 L out.  With diuresis, inhalers, abx, and cough suppressants, she is feeling much better, but not quite back to baseline.  Echocardiogram yesterday showed normal LV fxn with Gr 1 DD.  She currently has no complaints and is able to lie flat in bed.  Inpatient Medications  . azithromycin  500 mg Oral Daily  . dextromethorphan  30 mg Oral BID  . enoxaparin (LOVENOX) injection  40 mg Subcutaneous Q24H  . guaiFENesin  1,200 mg Oral BID  . levalbuterol  0.63 mg Nebulization BID  . levothyroxine  88 mcg Oral QAC breakfast  . pantoprazole  40 mg Oral Daily   Family History Family History  Problem Relation Age of Onset  . Diabetes type II Father     died @ 49.  . Breast cancer Sister     died @ 27.  Marland Kitchen Heart attack Mother     died @ 29.    Social History History   Social History  . Marital Status: Widowed    Spouse Name: N/A    Number of Children: N/A  . Years of Education: N/A   Occupational History  . Not on file.   Social History Main Topics   . Smoking status: Never Smoker   . Smokeless tobacco: Never Used  . Alcohol Use: No  . Drug Use: No  . Sexual Activity: No   Other Topics Concern  . Not on file   Social History Narrative   Moved to [place] from [place] in June 2014.  Lives at [facility].  Was exercising daily.    Review of Systems  General:  +++ generalized malaise prior to admission.  No chills, fever, night sweats or weight changes.  Cardiovascular:  No chest pain, +++ dyspnea on exertion, no edema, +++ orthopnea, no palpitations, +++ paroxysmal nocturnal dyspnea. Dermatological: No rash, lesions/masses Respiratory: +++ dry/non-productive cough and dyspnea. Urologic: No hematuria, dysuria Abdominal:   No nausea, vomiting, diarrhea, bright red blood per rectum, melena, or hematemesis Neurologic:  No visual changes, +++ wkns, changes in mental status. All other systems reviewed and are otherwise negative except as noted above.  Physical Exam  Blood pressure 148/63, pulse 81, temperature 97.4 F (36.3 C), temperature source Oral, resp. rate 18, height 5\' 5"  (1.651 m), weight 190 lb 4.1 oz (86.3 kg), SpO2 99.00%.  General: Pleasant, NAD Psych: Normal affect. Neuro: Alert and oriented X 3. Moves all extremities spontaneously. HEENT: Normal  Neck: Supple without bruits.  Difficult to assess JVP 2/2 girth. Lungs:  Resp regular and unlabored, bibasilar crackles with diminished breath sounds R>L base.  Scatt rhonchi and occas insp/exp wheeze (esp anterior). Heart: RRR no s3, s4, or murmurs. Abdomen: Soft, non-tender, non-distended, BS + x 4.  Extremities: No clubbing, cyanosis or edema. DP/PT/Radials 2+ and equal bilaterally.  Labs  Recent Labs  05/05/13 0005 05/05/13 0813 05/05/13 1430 05/05/13 2023  TROPONINI <0.30 <0.30 <0.30 <0.30   Lab Results  Component Value Date   WBC 5.2 05/06/2013   HGB 10.8* 05/06/2013   HCT 31.4* 05/06/2013   MCV 90.8 05/06/2013   PLT 309  05/06/2013    Recent Labs Lab 05/06/13 0504  NA 129*  K 3.8  CL 91*  CO2 26  BUN 11  CREATININE 0.81  CALCIUM 8.4  PROT 5.7*  BILITOT 0.2*  ALKPHOS 42  ALT 16  AST 18  GLUCOSE 91   Radiology/Studies  Dg Chest 2 View  05/06/2013   CLINICAL DATA:  Pneumonia.  Hypertension.  EXAM: CHEST  2 VIEW    IMPRESSION: 1. Mild atelectatic changes left lung base. 2. Sliding hiatal hernia.   Electronically Signed   By: Maisie Fus  Register   On: 05/06/2013 08:48   Ct Head Wo Contrast  05/05/2013  CLINICAL DATA:  Cough for about 2 weeks. Headache. Dizziness. Diaphoresis.  EXAM: CT HEAD WITHOUT CONTRAST  IMPRESSION: No acute intracranial abnormalities. Chronic atrophy and small vessel ischemic changes.   Electronically Signed   By: Burman Nieves M.D.   On: 05/05/2013 01:00   Dg Chest Portable 1 View  05/05/2013   CLINICAL DATA:  Shortness of breath and dizziness.  EXAM: PORTABLE CHEST - 1 VIEW   IMPRESSION: Mild cardiomegaly and central pulmonary vasculature congestion. Small left pleural effusion with left lower lobe atelectasis, less likely pneumonia. Consider follow-up PA and lateral views of the chest after treatment to verify improvement.   Electronically Signed   By: Awilda Metro   On: 05/05/2013 00:38   2D Echocardiogram 12.30.2014  Study Conclusions  - Left ventricle: The cavity size was normal. There was mild   concentric hypertrophy. Systolic function was normal. The   estimated ejection fraction was in the range of 60% to   65%. Wall motion was normal; there were no regional wall   motion abnormalities. Doppler parameters are consistent   with abnormal left ventricular relaxation (grade 1   diastolic dysfunction). The E/e' ratio is >10, suggesting   elevated LV filling pressure. - Aortic valve: Mildly calcified leaflets. There was no   stenosis. No regurgitation. - Mitral valve: Mildly thickened leaflets . Mild   regurgitation. - Tricuspid valve: No significant  regurgitation. - Systemic veins: The IVC was not visualized. - Pericardium, extracardiac: There was no pericardial   effusion. _____________   ECG  RSR, 74, antlat st dep with twi, t flattening in III, aVF.   ASSESSMENT AND PLAN  1.  Acute respiratory failure:  Likely multifactorial in setting of resp infxn and mild acute diastolic chf.  She has been having progressive DOE with non-productive cough ever since moving to [place].  There appears to be a temporal relationship between being placed on an ARB and the development of cough.  ARB is on hold currently and would rec d/c'ing altogether.  CXR showed vascular congestion and she has had symptomatic improvement with diuresis, along with abx/cough suppressants/inhalers.  Echo showed nl LV fxn and grade 1 diastolic dysfxn.  Though a POC trop was elevated @ 0.11, all troponins sent to the lab have been wnl.  Volume somewhat difficult to gauge 2/2 neck girth.  F/u pBNP and add low dose PO lasix.  2.  Acute Bronchitis:  As above, abx, inhalers, cough suppressants per IM.  3.  Acute diastolic CHF:  Nl EF by echo with Grade 1 diastolic dysfxn.  Responded well to lasix with some symptomatic improvement.  HR well-controlled.  After bout of hypotension in the ED (? Etiology), BP now trending in 140's to 150's.  Would resume low-dose amlodipine.  Avoid ARB as it may have contributed to cough.  Avoid BB for now in setting of bronchitis and bronchospasm.  Add low dose oral lasix therapy.  4.  Abnl ECG:  Pt says that she has been told that her ECG has been abnl for many yrs.  She underwent stress testing about 15 yrs ago in [place].  Agree that progressive DOE may be an anginal equivalent, though troponins are normal and Echo shows no WMA.  We can arrange for outpatient lexiscan cardiolite once she has recovered from acute illness.  Add ASA 81.  Check lipids.  5.  HTN:  As above.  Resume low-dose amlodipine and titrate as needed.  6.  Hypothyroidism:   TSH wnl.  She's on replacement.  7.  Normocytic Anemia:  H/H down slightly since admission.  No evidence of bleeding.  Likely 2/2 blood draws.  8.  Hypotension:  Resolved.  Etiology not clear.  Procalcitonin wnl.  Signed, Nicolasa Ducking, NP 05/06/2013, 2:21 PM   The patient was seen, examined and discussed with Saralyn Pilar, NP and agree as above.  79 year old female admitted with acute on chronic diastolic heart failure that responded well to diuresis and with an URI on tehrapy with antibiotics. She is almost euvolemic but we would recommend to continue Lasix 20 mg PO daily until the next visit in our clinic. We would also recommend an outpatient stress testing with an exercise treadmill stress test once she is recovered from URI.  Please hold ARB, avoid ACEI and restart amlodipine 2.5 mg po daily.  We will follow on lipid profile, add aspirin 81 mg po daily.  Please schedule a follow up visit in our [facility] clinic at [phone].  Tobias Alexander, Rexene Edison 05/06/2013

## 2013-05-06 NOTE — Progress Notes (Signed)
[facility] PROGRESS NOTE  Ellen Chandler AVW:[id number] DOB: [birth year] DOA: 05/04/2013 PCP: Thayer Headings, MD  Brief history 1 month hx of worsening shortness of breath with ambulation. For the past 1 week she started to have heavy breathing with minimal exertion and severe dry cough. She denies any fever. She has been drinking large amount of water in attempt to help with the cough.  On the day prior to admission, she started to feel lightheaded. She was evaluted in ER and was severely hypotensive in the waiting room down to 58/40 while she was in the waiting room. After getting IVF her blood pressure improved to 123/63. The patient was started on IV antibiotics.  Assessment/Plan: Dyspnea on exertion -Concerned about angina equivalent especially with abnormal EKG with ST-T wave changes in the lateral leads -Consult cardiology -Patient is breathing better after one dose of intravenous furosemide -Not convinced the patient has CAP with no fever or leukocytosis -Echo--EF 60-65%, grade 1 diastolic dysfunction, no WMA -Suspect she had a degree of fluid overload -start ASA Acute bronchitis -procalcitonin <0.10 - Switch to oral antibiotics Hyponatremia -Suspect hypervolemic Hyponatremia -Continue diuresis Bronchospasm  -start short acting beta agonist -antitussive HTN -d/c amlodipine -start BB if ok with cardiology Hypothyroidism -continue synthroid -TSH 3.737, T4-1 0.4  Family Communication:   Pt at beside Disposition Plan:   ILD when stable   Antibiotics:  Ceftriaxone 05/05/2013>>>05/06/13  Azithromycin 05/05/2013>>>        Procedures/Studies: Dg Chest 2 View  05/06/2013   CLINICAL DATA:  Pneumonia.  Hypertension.  EXAM: CHEST  2 VIEW  COMPARISON:  05/05/2013.  FINDINGS: Mild basilar atelectasis on the left noted. No pleural effusion or pneumothorax. Mediastinum and hilar structures are unremarkable. Stable borderline cardiomegaly with normal pulmonary  vascularity. Sliding hiatal hernia.  IMPRESSION: 1. Mild atelectatic changes left lung base. 2. Sliding hiatal hernia.   Electronically Signed   By: Maisie Fus  Register   On: 05/06/2013 08:48   Ct Head Wo Contrast  05/05/2013   CLINICAL DATA:  Cough for about 2 weeks. Headache. Dizziness. Diaphoresis.  EXAM: CT HEAD WITHOUT CONTRAST  TECHNIQUE: Contiguous axial images were obtained from the base of the skull through the vertex without intravenous contrast.  COMPARISON:  None.  FINDINGS: Diffuse cerebral atrophy. No significant ventricular dilatation. Patchy low-attenuation changes in the deep white matter consistent with small vessel ischemic change. No mass effect or midline shift. No abnormal extra-axial fluid collections. Gray-white matter junctions are distinct. Basal cisterns are not effaced. No evidence of acute intracranial hemorrhage. No depressed skull fractures. Visualized paranasal sinuses and mastoid air cells are not opacified.  IMPRESSION: No acute intracranial abnormalities. Chronic atrophy and small vessel ischemic changes.   Electronically Signed   By: Burman Nieves M.D.   On: 05/05/2013 01:00   Dg Chest Portable 1 View  05/05/2013   CLINICAL DATA:  Shortness of breath and dizziness.  EXAM: PORTABLE CHEST - 1 VIEW  COMPARISON:  Chest radiograph February 18, 2013  FINDINGS: Cardiac silhouette appears mildly enlarged. Low inspiratory examination with crowded vasculature markings, mild central pulmonary vasculature congestion. Small left pleural effusion. Strandy densities in left lung base. No pneumothorax.  Multiple EKG lines overlie the patient and may obscure subtle underlying pathology. Degenerative changes of the cervical spine.  IMPRESSION: Mild cardiomegaly and central pulmonary vasculature congestion. Small left pleural effusion with left lower lobe atelectasis, less likely pneumonia. Consider follow-up PA and lateral views of the chest after treatment to verify improvement.    Electronically Signed  By: Awilda Metro   On: 05/05/2013 00:38         Subjective: Patient is breathing better today. Denies any fevers, chills, chest discomfort. She is still having dyspnea on exertion. Denies nausea, vomiting, diarrhea, abdominal pain, dysuria.  Objective: Filed Vitals:   05/06/13 0238 05/06/13 0408 05/06/13 0519 05/06/13 0746  BP: 133/70  143/78   Pulse: 79  73   Temp: 98.4 F (36.9 C)  98.3 F (36.8 C)   TempSrc: Oral  Oral   Resp: 18  16   Height:      Weight:  86.3 kg (190 lb 4.1 oz)    SpO2: 93%  94% 94%    Intake/Output Summary (Last 24 hours) at 05/06/13 1126 Last data filed at 05/06/13 8657  Gross per 24 hour  Intake    600 ml  Output   2850 ml  Net  -2250 ml   Weight change: 13.724 kg (30 lb 4.1 oz) Exam:   General:  Pt is alert, follows commands appropriately, not in acute distress  HEENT: No icterus, No thrush,  [place]/AT  Cardiovascular: RRR, S1/S2, no rubs, no gallops  Respiratory: Fine bibasilar crackles, left greater than right. Scattered wheezing. Good air movement.  Abdomen: Soft/+BS, non tender, non distended, no guarding  Extremities: No edema, No lymphangitis, No petechiae, No rashes, no synovitis  Data Reviewed: Basic Metabolic Panel:  Recent Labs Lab 05/05/13 0005 05/05/13 0030 05/05/13 0813 05/06/13 0504  NA 123* 124*  --  129*  K 4.1 3.9  --  3.8  CL 86* 90*  --  91*  CO2 22  --   --  26  GLUCOSE 113* 112*  --  91  BUN 18 17  --  11  CREATININE 0.97 1.20* 0.86 0.81  CALCIUM 9.2  --   --  8.4   Liver Function Tests:  Recent Labs Lab 05/05/13 0005 05/06/13 0504  AST 22 18  ALT 17 16  ALKPHOS 49 42  BILITOT 0.3 0.2*  PROT 6.4 5.7*  ALBUMIN 3.6 3.1*   No results found for this basename: LIPASE, AMYLASE,  in the last 168 hours No results found for this basename: AMMONIA,  in the last 168 hours CBC:  Recent Labs Lab 05/05/13 0005 05/05/13 0030 05/05/13 0813 05/06/13 0504  WBC 9.8  --  8.0  5.2  NEUTROABS 4.7  --   --  2.1  HGB 12.4 12.6 11.4* 10.8*  HCT 34.9* 37.0 32.5* 31.4*  MCV 88.4  --  89.3 90.8  PLT 420*  --  344 309   Cardiac Enzymes:  Recent Labs Lab 05/05/13 0005 05/05/13 0813 05/05/13 1430 05/05/13 2023  TROPONINI <0.30 <0.30 <0.30 <0.30   BNP: No components found with this basename: POCBNP,  CBG:  Recent Labs Lab 05/04/13 2349  GLUCAP 116*    Recent Results (from the past 240 hour(s))  CULTURE, BLOOD (ROUTINE X 2)     Status: None   Collection Time    05/05/13  8:00 AM      Result Value Range Status   Specimen Description BLOOD RIGHT ARM   Final   Special Requests BOTTLES DRAWN AEROBIC AND ANAEROBIC [facility] EACH   Final   Culture  Setup Time     Final   Value: 05/05/2013 10:38     Performed at [facility]   Culture     Final   Value:        BLOOD CULTURE RECEIVED NO GROWTH TO  DATE CULTURE WILL BE HELD FOR 5 DAYS BEFORE ISSUING A FINAL NEGATIVE REPORT     Performed at [facility]   Report Status PENDING   Incomplete  CULTURE, BLOOD (ROUTINE X 2)     Status: None   Collection Time    05/05/13  8:13 AM      Result Value Range Status   Specimen Description BLOOD RIGHT ARM   Final   Special Requests BOTTLES DRAWN AEROBIC AND ANAEROBIC 5CC EACH   Final   Culture  Setup Time     Final   Value: 05/05/2013 10:38     Performed at [facility]   Culture     Final   Value:        BLOOD CULTURE RECEIVED NO GROWTH TO DATE CULTURE WILL BE HELD FOR 5 DAYS BEFORE ISSUING A FINAL NEGATIVE REPORT     Performed at [facility]   Report Status PENDING   Incomplete     Scheduled Meds: . azithromycin  500 mg Oral Daily  . cefTRIAXone (ROCEPHIN)  IV  1 g Intravenous Q24H  . dextromethorphan  30 mg Oral BID  . enoxaparin (LOVENOX) injection  40 mg Subcutaneous Q24H  . guaiFENesin  1,200 mg Oral BID  . levalbuterol  0.63 mg Nebulization BID  . levothyroxine  88 mcg Oral QAC breakfast  . pantoprazole  40 mg Oral Daily    Continuous Infusions:    Ellen Murrillo, DO  [facility] Pager [phone]  If 7PM-7AM, please contact night-coverage [email or web address] Password [email or web address] 05/06/2013, 11:26 AM   LOS: 2 days

## 2013-05-07 DIAGNOSIS — J189 Pneumonia, unspecified organism: Secondary | ICD-10-CM | POA: Diagnosis not present

## 2013-05-07 DIAGNOSIS — I5031 Acute diastolic (congestive) heart failure: Secondary | ICD-10-CM | POA: Diagnosis not present

## 2013-05-07 DIAGNOSIS — I959 Hypotension, unspecified: Secondary | ICD-10-CM | POA: Diagnosis not present

## 2013-05-07 DIAGNOSIS — R0609 Other forms of dyspnea: Secondary | ICD-10-CM

## 2013-05-07 DIAGNOSIS — J209 Acute bronchitis, unspecified: Secondary | ICD-10-CM | POA: Diagnosis not present

## 2013-05-07 DIAGNOSIS — R9431 Abnormal electrocardiogram [ECG] [EKG]: Secondary | ICD-10-CM | POA: Diagnosis not present

## 2013-05-07 DIAGNOSIS — I10 Essential (primary) hypertension: Secondary | ICD-10-CM | POA: Diagnosis not present

## 2013-05-07 DIAGNOSIS — R0989 Other specified symptoms and signs involving the circulatory and respiratory systems: Secondary | ICD-10-CM

## 2013-05-07 DIAGNOSIS — I509 Heart failure, unspecified: Secondary | ICD-10-CM

## 2013-05-07 LAB — BASIC METABOLIC PANEL
BUN: 15 mg/dL (ref 6–23)
CO2: 29 mEq/L (ref 19–32)
Calcium: 8.9 mg/dL (ref 8.4–10.5)
Chloride: 92 mEq/L — ABNORMAL LOW (ref 96–112)
Creatinine, Ser: 0.83 mg/dL (ref 0.50–1.10)
GFR calc Af Amer: 75 mL/min — ABNORMAL LOW (ref >90)
GFR calc non Af Amer: 65 mL/min — ABNORMAL LOW (ref >90)
Glucose, Bld: 78 mg/dL (ref 70–99)
Potassium: 3.7 mEq/L (ref 3.7–5.3)
Sodium: 131 mEq/L — ABNORMAL LOW (ref 137–147)

## 2013-05-07 LAB — CBC
HCT: 34.9 % — ABNORMAL LOW (ref 36.0–46.0)
Hemoglobin: 11.9 g/dL — ABNORMAL LOW (ref 12.0–15.0)
MCH: 31.2 pg (ref 26.0–34.0)
MCHC: 34.1 g/dL (ref 30.0–36.0)
MCV: 91.6 fL (ref 78.0–100.0)
Platelets: 350 10*3/uL (ref 150–400)
RBC: 3.81 MIL/uL — ABNORMAL LOW (ref 3.87–5.11)
RDW: 12.5 % (ref 11.5–15.5)
WBC: 5.6 10*3/uL (ref 4.0–10.5)

## 2013-05-07 LAB — PRO B NATRIURETIC PEPTIDE: Pro B Natriuretic peptide (BNP): 596.4 pg/mL — ABNORMAL HIGH (ref 0–450)

## 2013-05-07 LAB — PROCALCITONIN: Procalcitonin: <0.1 ng/mL

## 2013-05-07 MED ORDER — AZITHROMYCIN 500 MG PO TABS: 500.0000 mg | ORAL_TABLET | Freq: Every day | ORAL | Status: DC

## 2013-05-07 MED ORDER — FUROSEMIDE 20 MG PO TABS: 20.0000 mg | ORAL_TABLET | Freq: Every day | ORAL | Status: DC

## 2013-05-07 MED ORDER — ASPIRIN EC 81 MG PO TBEC: 81.0000 mg | DELAYED_RELEASE_TABLET | Freq: Every day | ORAL | Status: DC

## 2013-05-07 MED ORDER — AMLODIPINE BESYLATE 2.5 MG PO TABS: 2.5000 mg | ORAL_TABLET | Freq: Every day | ORAL | Status: DC

## 2013-05-07 NOTE — Progress Notes (Signed)
Patient Name: Ellen Chandler Date of Encounter: 05/07/2013  Active Problems:   Orthostatic hypotension   Hyponatremia   Nonspecific abnormal electrocardiogram (ECG) (EKG)   Acute bronchitis   Unspecified essential hypertension   Acute diastolic CHF (congestive heart failure)   Length of Stay: 3  SUBJECTIVE  The patient looks comfortable and states that she feels better.  CURRENT MEDS . amLODipine  2.5 mg Oral Daily  . azithromycin  500 mg Oral Daily  . dextromethorphan  30 mg Oral BID  . enoxaparin (LOVENOX) injection  40 mg Subcutaneous Q24H  . furosemide  20 mg Oral Daily  . guaiFENesin  1,200 mg Oral BID  . levalbuterol  0.63 mg Nebulization BID  . levothyroxine  88 mcg Oral QAC breakfast  . pantoprazole  40 mg Oral Daily   OBJECTIVE  Filed Vitals:   05/06/13 0746 05/06/13 1306 05/06/13 2150 05/07/13 0500  BP:  148/63 144/79 132/71  Pulse:  81 82 80  Temp:  97.4 F (36.3 C) 98.3 F (36.8 C) 98.6 F (37 C)  TempSrc:  Oral Oral Oral  Resp:  18 20 16   Height:      Weight:    183 lb 13.8 oz (83.4 kg)  SpO2: 94% 99% 98% 99%    Intake/Output Summary (Last 24 hours) at 05/07/13 0844 Last data filed at 05/07/13 0210  Gross per 24 hour  Intake    480 ml  Output   3235 ml  Net  -2755 ml   Filed Weights   05/05/13 0622 05/06/13 0408 05/07/13 0500  Weight: 191 lb 5.8 oz (86.8 kg) 190 lb 4.1 oz (86.3 kg) 183 lb 13.8 oz (83.4 kg)    PHYSICAL EXAM  General: Pleasant, NAD. obese Neuro: Alert and oriented X 3. Moves all extremities spontaneously. Psych: Normal affect. HEENT:  Normal  Neck: Supple without bruits or JVD. Lungs:  Resp regular and unlabored, CTA. Heart: RRR no s3, s4, or murmurs. Abdomen: Soft, non-tender, non-distended, BS + x 4.  Extremities: No clubbing, cyanosis or edema. DP/PT/Radials 2+ and equal bilaterally.  Accessory Clinical Findings  CBC  Recent Labs  05/05/13 0005  05/06/13 0504 05/07/13 0403  WBC 9.8  < > 5.2 5.6    NEUTROABS 4.7  --  2.1  --   HGB 12.4  < > 10.8* 11.9*  HCT 34.9*  < > 31.4* 34.9*  MCV 88.4  < > 90.8 91.6  PLT 420*  < > 309 350  < > = values in this interval not displayed. Basic Metabolic Panel  Recent Labs  05/06/13 0504 05/07/13 0403  NA 129* 131*  K 3.8 3.7  CL 91* 92*  CO2 26 29  GLUCOSE 91 78  BUN 11 15  CREATININE 0.81 0.83  CALCIUM 8.4 8.9   Liver Function Tests  Recent Labs  05/05/13 0005 05/06/13 0504  AST 22 18  ALT 17 16  ALKPHOS 49 42  BILITOT 0.3 0.2*  PROT 6.4 5.7*  ALBUMIN 3.6 3.1*   No results found for this basename: LIPASE, AMYLASE,  in the last 72 hours Cardiac Enzymes  Recent Labs  05/05/13 0813 05/05/13 1430 05/05/13 2023  TROPONINI <0.30 <0.30 <0.30    Recent Labs  05/05/13 0005  TSH 3.737    Radiology/Studies  Dg Chest 2 View  05/06/2013   CLINICAL DATA:  Pneumonia.  Hypertension.  EXAM: CHEST  2 VIEW  COMPARISON:  05/05/2013.  FINDINGS: Mild basilar atelectasis on the left noted. No pleural  effusion or pneumothorax. Mediastinum and hilar structures are unremarkable. Stable borderline cardiomegaly with normal pulmonary vascularity. Sliding hiatal hernia.  IMPRESSION: 1. Mild atelectatic changes left lung base. 2. Sliding hiatal hernia.   Electronically Signed   By: Marcello Moores  Register   On: 05/06/2013 08:48   TELE  SR, 70-80 BPM   ASSESSMENT AND PLAN  1. Acute respiratory failure: Likely multifactorial in setting of resp infxn and mild acute diastolic chf. She has been having progressive DOE with non-productive cough ever since moving to [place]. There appears to be a temporal relationship between being placed on an ARB and the development of cough. ARB is on hold currently and would rec d/c'ing altogether. CXR showed vascular congestion and she has had symptomatic improvement with diuresis, along with abx/cough suppressants/inhalers. Echo showed nl LV fxn and grade 1 diastolic dysfxn. Though a POC trop was elevated @ 0.11, all  troponins sent to the lab have been wnl. Volume somewhat difficult to gauge 2/2 neck girth. F/u pBNP and add low dose PO lasix.  2. Acute Bronchitis: As above, abx, inhalers, cough suppressants per IM.  3. Acute diastolic CHF: Nl EF by echo with Grade 1 diastolic dysfxn. Responded well to lasix with some symptomatic improvement. HR well-controlled. After bout of hypotension in the ED (? Etiology), BP now trending in 140's to 150's. Would resume low-dose amlodipine. Avoid ARB as it may have contributed to cough. Avoid BB for now in setting of bronchitis and bronchospasm. Add low dose oral lasix therapy.  4. Abnl ECG: Pt says that she has been told that her ECG has been abnl for many yrs. She underwent stress testing about 15 yrs ago in [place]. Agree that progressive DOE may be an anginal equivalent, though troponins are normal and Echo shows no WMA. We can arrange for outpatient lexiscan cardiolite once she has recovered from acute illness. Add ASA 81. Check lipids.  5. HTN: As above. Resume low-dose amlodipine and titrate as needed.  6. Hypothyroidism: TSH wnl. She's on replacement.  7. Normocytic Anemia: H/H down slightly since admission. No evidence of bleeding. Likely 2/2 blood draws.  8. Hypotension: Resolved. Etiology not clear. Procalcitonin wnl.   Excellent diuresis with a small dose of PO lasix, -2700 in the last 24 hours.   Home instructions:  She is almost euvolemic but we would recommend to continue Lasix 20 mg PO daily until the next visit in our clinic.  We would also recommend an outpatient stress testing with an exercise treadmill stress test once she is recovered from URI.  Please hold ARB, avoid ACEI and restart amlodipine 2.5 mg po daily.  We will follow on lipid profile, add aspirin 81 mg po daily.  Please schedule a follow up visit in our [place] clinic at [phone].  Signed, Ena Dawley, H MD, [facility] 05/07/2013

## 2013-05-08 LAB — LEGIONELLA ANTIGEN, URINE: Legionella Antigen, Urine: NEGATIVE

## 2013-05-11 LAB — CULTURE, BLOOD (ROUTINE X 2)
Culture: NO GROWTH
Culture: NO GROWTH

## 2013-06-04 DIAGNOSIS — E785 Hyperlipidemia, unspecified: Secondary | ICD-10-CM | POA: Diagnosis not present

## 2013-06-04 DIAGNOSIS — N182 Chronic kidney disease, stage 2 (mild): Secondary | ICD-10-CM | POA: Diagnosis not present

## 2013-06-04 DIAGNOSIS — E039 Hypothyroidism, unspecified: Secondary | ICD-10-CM | POA: Diagnosis not present

## 2013-06-04 DIAGNOSIS — I10 Essential (primary) hypertension: Secondary | ICD-10-CM | POA: Diagnosis not present

## 2013-06-05 ENCOUNTER — Ambulatory Visit (HOSPITAL_COMMUNITY)
Admission: RE | Admit: 2013-06-05 | Discharge: 2013-06-05 | Disposition: A | Discharge status: Home / Self Care | Payer: Medicare Other | Source: Ambulatory Visit | Attending: Internal Medicine | Admitting: Internal Medicine

## 2013-06-05 DIAGNOSIS — Z1231 Encounter for screening mammogram for malignant neoplasm of breast: Secondary | ICD-10-CM | POA: Insufficient documentation

## 2013-09-03 DIAGNOSIS — R0902 Hypoxemia: Secondary | ICD-10-CM | POA: Diagnosis not present

## 2013-09-03 DIAGNOSIS — J209 Acute bronchitis, unspecified: Secondary | ICD-10-CM | POA: Diagnosis not present

## 2013-12-03 DIAGNOSIS — E039 Hypothyroidism, unspecified: Secondary | ICD-10-CM | POA: Diagnosis not present

## 2013-12-03 DIAGNOSIS — I10 Essential (primary) hypertension: Secondary | ICD-10-CM | POA: Diagnosis not present

## 2013-12-10 DIAGNOSIS — E785 Hyperlipidemia, unspecified: Secondary | ICD-10-CM | POA: Diagnosis not present

## 2013-12-10 DIAGNOSIS — E039 Hypothyroidism, unspecified: Secondary | ICD-10-CM | POA: Diagnosis not present

## 2013-12-10 DIAGNOSIS — N182 Chronic kidney disease, stage 2 (mild): Secondary | ICD-10-CM | POA: Diagnosis not present

## 2013-12-10 DIAGNOSIS — I10 Essential (primary) hypertension: Secondary | ICD-10-CM | POA: Diagnosis not present

## 2014-01-01 DIAGNOSIS — Z23 Encounter for immunization: Secondary | ICD-10-CM | POA: Diagnosis not present

## 2014-05-19 ENCOUNTER — Emergency Department (HOSPITAL_COMMUNITY)
Admission: EM | Admit: 2014-05-19 | Discharge: 2014-05-19 | Disposition: A | Discharge status: Home / Self Care | Payer: PPO | Attending: Emergency Medicine | Admitting: Emergency Medicine

## 2014-05-19 ENCOUNTER — Encounter (HOSPITAL_COMMUNITY): Payer: Self-pay | Admitting: Family Medicine

## 2014-05-19 ENCOUNTER — Emergency Department (HOSPITAL_COMMUNITY): Payer: PPO

## 2014-05-19 DIAGNOSIS — I10 Essential (primary) hypertension: Secondary | ICD-10-CM | POA: Diagnosis not present

## 2014-05-19 DIAGNOSIS — Z7982 Long term (current) use of aspirin: Secondary | ICD-10-CM | POA: Diagnosis not present

## 2014-05-19 DIAGNOSIS — K219 Gastro-esophageal reflux disease without esophagitis: Secondary | ICD-10-CM | POA: Diagnosis not present

## 2014-05-19 DIAGNOSIS — E039 Hypothyroidism, unspecified: Secondary | ICD-10-CM | POA: Diagnosis not present

## 2014-05-19 DIAGNOSIS — Z79899 Other long term (current) drug therapy: Secondary | ICD-10-CM | POA: Diagnosis not present

## 2014-05-19 DIAGNOSIS — R404 Transient alteration of awareness: Secondary | ICD-10-CM | POA: Diagnosis not present

## 2014-05-19 DIAGNOSIS — R05 Cough: Secondary | ICD-10-CM

## 2014-05-19 DIAGNOSIS — J069 Acute upper respiratory infection, unspecified: Secondary | ICD-10-CM | POA: Diagnosis not present

## 2014-05-19 DIAGNOSIS — I509 Heart failure, unspecified: Secondary | ICD-10-CM | POA: Insufficient documentation

## 2014-05-19 DIAGNOSIS — R531 Weakness: Secondary | ICD-10-CM | POA: Diagnosis not present

## 2014-05-19 DIAGNOSIS — R059 Cough, unspecified: Secondary | ICD-10-CM

## 2014-05-19 LAB — I-STAT CHEM 8, ED
BUN: 16 mg/dL (ref 6–23)
CREATININE: 0.8 mg/dL (ref 0.50–1.10)
Calcium, Ion: 1.12 mmol/L — ABNORMAL LOW (ref 1.13–1.30)
Chloride: 97 mEq/L (ref 96–112)
Glucose, Bld: 97 mg/dL (ref 70–99)
HCT: 40 % (ref 36.0–46.0)
Hemoglobin: 13.6 g/dL (ref 12.0–15.0)
Potassium: 4 mmol/L (ref 3.5–5.1)
Sodium: 133 mmol/L — ABNORMAL LOW (ref 135–145)
TCO2: 26 mmol/L (ref 0–100)

## 2014-05-19 LAB — CBC WITH DIFFERENTIAL/PLATELET
Basophils Absolute: 0 10*3/uL (ref 0.0–0.1)
Basophils Relative: 1 % (ref 0–1)
EOS PCT: 2 % (ref 0–5)
Eosinophils Absolute: 0.1 10*3/uL (ref 0.0–0.7)
HEMATOCRIT: 38.9 % (ref 36.0–46.0)
Hemoglobin: 13.5 g/dL (ref 12.0–15.0)
LYMPHS ABS: 3 10*3/uL (ref 0.7–4.0)
LYMPHS PCT: 40 % (ref 12–46)
MCH: 33.3 pg (ref 26.0–34.0)
MCHC: 34.7 g/dL (ref 30.0–36.0)
MCV: 95.8 fL (ref 78.0–100.0)
Monocytes Absolute: 1.1 10*3/uL — ABNORMAL HIGH (ref 0.1–1.0)
Monocytes Relative: 14 % — ABNORMAL HIGH (ref 3–12)
Neutro Abs: 3.3 10*3/uL (ref 1.7–7.7)
Neutrophils Relative %: 43 % (ref 43–77)
Platelets: 355 10*3/uL (ref 150–400)
RBC: 4.06 MIL/uL (ref 3.87–5.11)
RDW: 12.8 % (ref 11.5–15.5)
WBC: 7.5 10*3/uL (ref 4.0–10.5)

## 2014-05-19 LAB — BRAIN NATRIURETIC PEPTIDE: B Natriuretic Peptide: 34.3 pg/mL (ref 0.0–100.0)

## 2014-05-19 LAB — URINALYSIS, ROUTINE W REFLEX MICROSCOPIC
Bilirubin Urine: NEGATIVE
Glucose, UA: NEGATIVE mg/dL
Ketones, ur: NEGATIVE mg/dL
Nitrite: NEGATIVE
PH: 7 (ref 5.0–8.0)
Protein, ur: NEGATIVE mg/dL
Specific Gravity, Urine: 1.004 — ABNORMAL LOW (ref 1.005–1.030)
Urobilinogen, UA: 0.2 mg/dL (ref 0.0–1.0)

## 2014-05-19 LAB — URINE MICROSCOPIC-ADD ON

## 2014-05-19 LAB — I-STAT TROPONIN, ED: TROPONIN I, POC: 0 ng/mL (ref 0.00–0.08)

## 2014-05-19 MED ORDER — BENZONATATE 200 MG PO CAPS: 200.0000 mg | ORAL_CAPSULE | Freq: Three times a day (TID) | ORAL | Status: DC | PRN

## 2014-05-19 NOTE — ED Notes (Signed)
Bed: TG54 Expected date: 05/19/14 Expected time: 5:07 AM Means of arrival: Ambulance Comments: Weakness, vomiting

## 2014-05-19 NOTE — ED Notes (Signed)
Denies any nausea, vomiting, diarrhea, or fever.

## 2014-05-19 NOTE — Discharge Instructions (Signed)
Upper Respiratory Infection, Adult An upper respiratory infection (URI) is also sometimes known as the common cold. The upper respiratory tract includes the nose, sinuses, throat, trachea, and bronchi. Bronchi are the airways leading to the lungs. Most people improve within 1 week, but symptoms can last up to 2 weeks. A residual cough may last even longer.  CAUSES Many different viruses can infect the tissues lining the upper respiratory tract. The tissues become irritated and inflamed and often become very moist. Mucus production is also common. A cold is contagious. You can easily spread the virus to others by oral contact. This includes kissing, sharing a glass, coughing, or sneezing. Touching your mouth or nose and then touching a surface, which is then touched by another person, can also spread the virus. SYMPTOMS  Symptoms typically develop 1 to 3 days after you come in contact with a cold virus. Symptoms vary from person to person. They may include:  Runny nose.  Sneezing.  Nasal congestion.  Sinus irritation.  Sore throat.  Loss of voice (laryngitis).  Cough.  Fatigue.  Muscle aches.  Loss of appetite.  Headache.  Low-grade fever. DIAGNOSIS  You might diagnose your own cold based on familiar symptoms, since most people get a cold 2 to 3 times a year. Your caregiver can confirm this based on your exam. Most importantly, your caregiver can check that your symptoms are not due to another disease such as strep throat, sinusitis, pneumonia, asthma, or epiglottitis. Blood tests, throat tests, and X-rays are not necessary to diagnose a common cold, but they may sometimes be helpful in excluding other more serious diseases. Your caregiver will decide if any further tests are required. RISKS AND COMPLICATIONS  You may be at risk for a more severe case of the common cold if you smoke cigarettes, have chronic heart disease (such as heart failure) or lung disease (such as asthma), or if  you have a weakened immune system. The very young and very old are also at risk for more serious infections. Bacterial sinusitis, middle ear infections, and bacterial pneumonia can complicate the common cold. The common cold can worsen asthma and chronic obstructive pulmonary disease (COPD). Sometimes, these complications can require emergency medical care and may be life-threatening. PREVENTION  The best way to protect against getting a cold is to practice good hygiene. Avoid oral or hand contact with people with cold symptoms. Wash your hands often if contact occurs. There is no clear evidence that vitamin C, vitamin E, echinacea, or exercise reduces the chance of developing a cold. However, it is always recommended to get plenty of rest and practice good nutrition. TREATMENT  Treatment is directed at relieving symptoms. There is no cure. Antibiotics are not effective, because the infection is caused by a virus, not by bacteria. Treatment may include:  Increased fluid intake. Sports drinks offer valuable electrolytes, sugars, and fluids.  Breathing heated mist or steam (vaporizer or shower).  Eating chicken soup or other clear broths, and maintaining good nutrition.  Getting plenty of rest.  Using gargles or lozenges for comfort.  Controlling fevers with ibuprofen or acetaminophen as directed by your caregiver.  Increasing usage of your inhaler if you have asthma. Zinc gel and zinc lozenges, taken in the first 24 hours of the common cold, can shorten the duration and lessen the severity of symptoms. Pain medicines may help with fever, muscle aches, and throat pain. A variety of non-prescription medicines are available to treat congestion and runny nose. Your caregiver   can make recommendations and may suggest nasal or lung inhalers for other symptoms.  HOME CARE INSTRUCTIONS   Only take over-the-counter or prescription medicines for pain, discomfort, or fever as directed by your  caregiver.  Use a warm mist humidifier or inhale steam from a shower to increase air moisture. This may keep secretions moist and make it easier to breathe.  Drink enough water and fluids to keep your urine clear or pale yellow.  Rest as needed.  Return to work when your temperature has returned to normal or as your caregiver advises. You may need to stay home longer to avoid infecting others. You can also use a face mask and careful hand washing to prevent spread of the virus. SEEK MEDICAL CARE IF:   After the first few days, you feel you are getting worse rather than better.  You need your caregiver's advice about medicines to control symptoms.  You develop chills, worsening shortness of breath, or brown or red sputum. These may be signs of pneumonia.  You develop yellow or brown nasal discharge or pain in the face, especially when you bend forward. These may be signs of sinusitis.  You develop a fever, swollen neck glands, pain with swallowing, or white areas in the back of your throat. These may be signs of strep throat. SEEK IMMEDIATE MEDICAL CARE IF:   You have a fever.  You develop severe or persistent headache, ear pain, sinus pain, or chest pain.  You develop wheezing, a prolonged cough, cough up blood, or have a change in your usual mucus (if you have chronic lung disease).  You develop sore muscles or a stiff neck. Document Released: 10/17/2000 Document Revised: 07/16/2011 Document Reviewed: 07/29/2013 ExitCare Patient Information 2015 [employer]. This information is not intended to replace advice given to you by your health care provider. Make sure you discuss any questions you have with your health care provider.  

## 2014-05-19 NOTE — ED Provider Notes (Signed)
CSN: [id number]     Arrival date & time 05/19/14  4315 History   First MD Initiated Contact with Patient 05/19/14 0559     Chief Complaint  Patient presents with  . Weakness     (Consider location/radiation/quality/duration/timing/severity/associated sxs/prior Treatment) HPI   81 year old female with history of hypothyroidism, CHF on Lasix and amlodipine, hypertension, GERD who was brought here via EMS from home with a chief complaint of generalized weakness. Patient reports for the past week she has had complaints of nasal congestion, runny nose, nonproductive cough with increased shortness of breath and generalized weakness. Other cough is improving, patient felt she cannot get a good breath and report dyspnea on exertion even with walking around her house. Also complaining of chest pressure with exertion which has been waxing and waning. She denies having any significant fever, chills, productive cough, abdominal pain, nausea vomiting diarrhea, dysuria, focal numbness, or rash. No report of lightheadedness, dizziness, or diaphoresis.  She recall having been admitted in the past for pneumonia when her blood pressure was low and she has to be kept in the ICU. She reports being diagnosed with CHF last year and currently on Lasix and amlodipine which she has been taking as prescribed. She has not noticed any significant leg swelling or calf pain.  No recent travel, no prior history of PE or DVT. Patient is not on home O2, she is a nonsmoker.  Past Medical History  Diagnosis Date  . Hypertension   . Hypothyroidism   . Diastolic dysfunction     a. 04/2013 Echo: EF 60-65%, No rwma, Gr 1 DD, mild MR.  Marland Kitchen GERD (gastroesophageal reflux disease)   . Abnormal ECG     a. Pt aware of long hx of abnl ecg->h/o normal stress testing in [place] ~ 2000.   Past Surgical History  Procedure Laterality Date  . Joint replacement      a. knee left - injury resulted after being attacked by a dog.  .  Abdominal hysterectomy     Family History  Problem Relation Age of Onset  . Diabetes type II Father     died @ 14.  . Breast cancer Sister     died @ 89.  Marland Kitchen Heart attack Mother     died @ 61.   History  Substance Use Topics  . Smoking status: Never Smoker   . Smokeless tobacco: Never Used  . Alcohol Use: No   OB History    No data available     Review of Systems  All other systems reviewed and are negative.     Allergies  Review of patient's allergies indicates no known allergies.  Home Medications   Prior to Admission medications   Medication Sig Start Date End Date Taking? Authorizing Provider  amLODipine (NORVASC) 2.5 MG tablet Take 1 tablet (2.5 mg total) by mouth daily. 05/07/13  Yes Orson Eva, MD  aspirin EC 81 MG tablet Take 1 tablet (81 mg total) by mouth daily. 05/07/13  Yes Orson Eva, MD  Carboxymethylcellulose Sodium (REFRESH TEARS OP) Apply 1 drop to eye every morning.   Yes Historical Provider, MD  furosemide (LASIX) 20 MG tablet Take 1 tablet (20 mg total) by mouth daily. 05/07/13  Yes Orson Eva, MD  levothyroxine (SYNTHROID, LEVOTHROID) 88 MCG tablet Take 88 mcg by mouth daily before breakfast.   Yes Historical Provider, MD  Multiple Vitamin (MULTIVITAMIN WITH MINERALS) TABS tablet Take 1 tablet by mouth daily.   Yes Historical Provider, MD  nabumetone (RELAFEN) 750 MG tablet Take 750 mg by mouth 2 (two) times daily as needed for mild pain.   Yes Historical Provider, MD  omeprazole (PRILOSEC) 20 MG capsule Take 20 mg by mouth daily.    Yes Historical Provider, MD  azithromycin (ZITHROMAX) 500 MG tablet Take 1 tablet (500 mg total) by mouth daily. Patient not taking: Reported on 05/19/2014 05/07/13   Orson Eva, MD  benzonatate (TESSALON) 200 MG capsule Take 1 capsule (200 mg total) by mouth 3 (three) times daily as needed for cough. Patient not taking: Reported on 05/19/2014 05/06/13   Orson Eva, MD   Ht 5\' 5"  (1.651 m)  Wt 175 lb (79.379 kg)  BMI 29.12 kg/m2   SpO2 98% Physical Exam  Constitutional: She is oriented to person, place, and time. She appears well-developed and well-nourished. No distress.  Caucasian female resting comfortably in bed, appears to be in no acute distress.  HENT:  Head: Atraumatic.  Mouth/Throat: Oropharynx is clear and moist.  Eyes: Conjunctivae are normal.  Neck: Normal range of motion. Neck supple. No JVD present.  No nuchal rigidity  Cardiovascular: Normal rate and regular rhythm.  Exam reveals no gallop and no friction rub.   No murmur heard. Pulmonary/Chest: She has no wheezes. She has no rales.  Patient is mildly tachypneic  Abdominal: Soft. There is no tenderness.  Musculoskeletal: She exhibits no edema.  Equal strength to all extremities with poor effort.  Neurological: She is alert and oriented to person, place, and time. GCS eye subscore is 4. GCS verbal subscore is 5. GCS motor subscore is 6.  Skin: No rash noted.  Psychiatric: She has a normal mood and affect.  Nursing note and vitals reviewed.   ED Course  Procedures (including critical care time)  Patient with cold symptoms here with generalized weakness and worsening shortness of breath. Symptoms concerning for possible pneumonia. Workup initiated.  Pt did had prior hospitalization with diagnosis of bronchitis.  She did had trace of CHF.  She also found to have orthostatic hypotension with 1 syncopal episode.  She is concern today.  7:07 AM CXR without obvious pna.  She is afebrile, VSS.  Care discussed with Dr. Sharol Given  7:38 AM Labs are reassuring, patient is without hypoxia with ambulation, maintaining 95% on room air. Normal BMP, EKG and troponins are unremarkable. UA with moderate leukocytes and 7-10 WBC with many bacteria however patient did not complain of any dysuria, therefore I do not think treatment is indicated at this time. Patient agrees to follow-up closely with her PCP for outpatient care. At this time patient is stable for discharge.  Return precautions discussed.  Labs Review Labs Reviewed  CBC WITH DIFFERENTIAL - Abnormal; Notable for the following:    Monocytes Relative 14 (*)    Monocytes Absolute 1.1 (*)    All other components within normal limits  URINALYSIS, ROUTINE W REFLEX MICROSCOPIC - Abnormal; Notable for the following:    Specific Gravity, Urine 1.004 (*)    Hgb urine dipstick TRACE (*)    Leukocytes, UA MODERATE (*)    All other components within normal limits  URINE MICROSCOPIC-ADD ON - Abnormal; Notable for the following:    Bacteria, UA MANY (*)    All other components within normal limits  I-STAT CHEM 8, ED - Abnormal; Notable for the following:    Sodium 133 (*)    Calcium, Ion 1.12 (*)    All other components within normal limits  BRAIN NATRIURETIC PEPTIDE  Randolm Idol, ED    Imaging Review Dg Chest 2 View  05/19/2014   CLINICAL DATA:  Acute onset of cough and shortness of breath. Difficulty breathing. Initial encounter.  EXAM: CHEST  2 VIEW  COMPARISON:  Chest radiograph performed 05/06/2013  FINDINGS: The lungs are relatively well-aerated. Minimal left basilar atelectasis or scarring is noted. There is no evidence of pleural effusion or pneumothorax.  The heart is borderline enlarged. A moderate hiatal hernia is seen. No acute osseous abnormalities are seen.  IMPRESSION: 1. Minimal left basilar atelectasis or scarring noted. Borderline cardiomegaly. 2. Moderate hiatal hernia again seen.   Electronically Signed   By: Garald Balding M.D.   On: 05/19/2014 06:53     EKG Interpretation   Date/Time:  Wednesday May 19 2014 05:30:27 EST Ventricular Rate:  82 PR Interval:  184 QRS Duration: 96 QT Interval:  411 QTC Calculation: 480 R Axis:   7 Text Interpretation:  Sinus rhythm Low voltage, precordial leads RSR' in  V1 or V2, right VCD or RVH Confirmed by OTTER  MD, OLGA ([id number]) on  05/19/2014 6:50:53 AM      MDM   Final diagnoses:  Cough  URI (upper respiratory infection)     BP 164/66 mmHg  Pulse 77  Temp(Src) 98.2 F (36.8 C) (Oral)  Resp 24  Ht 5\' 5"  (1.651 m)  Wt 175 lb (79.379 kg)  BMI 29.12 kg/m2  SpO2 97%  I have reviewed nursing notes and vital signs. I personally reviewed the imaging tests through PACS system  I reviewed available ER/hospitalization records thought the EMR     Domenic Moras, PA-C 05/19/14 [place], MD 05/20/14 [phone]

## 2014-05-19 NOTE — ED Notes (Signed)
Per EMS, patient reports she has bee fighting a cold for the last week. However, for the last 3 days she has increased generalized weakness and can't get a good breath. Denies pain. Pt is from home.

## 2014-05-19 NOTE — ED Notes (Signed)
Patient transported to X-ray 

## 2014-06-10 ENCOUNTER — Other Ambulatory Visit (HOSPITAL_COMMUNITY): Payer: Self-pay | Admitting: Internal Medicine

## 2014-06-10 DIAGNOSIS — Z1231 Encounter for screening mammogram for malignant neoplasm of breast: Secondary | ICD-10-CM

## 2014-06-18 ENCOUNTER — Ambulatory Visit (HOSPITAL_COMMUNITY): Payer: PPO

## 2014-06-23 ENCOUNTER — Emergency Department (HOSPITAL_COMMUNITY)
Admission: EM | Admit: 2014-06-23 | Discharge: 2014-06-24 | Disposition: A | Discharge status: Home / Self Care | Payer: PPO | Attending: Emergency Medicine | Admitting: Emergency Medicine

## 2014-06-23 ENCOUNTER — Encounter (HOSPITAL_COMMUNITY): Payer: Self-pay

## 2014-06-23 ENCOUNTER — Emergency Department (HOSPITAL_COMMUNITY): Payer: PPO

## 2014-06-23 DIAGNOSIS — W07XXXA Fall from chair, initial encounter: Secondary | ICD-10-CM | POA: Insufficient documentation

## 2014-06-23 DIAGNOSIS — Y9389 Activity, other specified: Secondary | ICD-10-CM | POA: Insufficient documentation

## 2014-06-23 DIAGNOSIS — Z7982 Long term (current) use of aspirin: Secondary | ICD-10-CM | POA: Insufficient documentation

## 2014-06-23 DIAGNOSIS — Y9289 Other specified places as the place of occurrence of the external cause: Secondary | ICD-10-CM | POA: Insufficient documentation

## 2014-06-23 DIAGNOSIS — Y998 Other external cause status: Secondary | ICD-10-CM | POA: Diagnosis not present

## 2014-06-23 DIAGNOSIS — S20211A Contusion of right front wall of thorax, initial encounter: Secondary | ICD-10-CM | POA: Insufficient documentation

## 2014-06-23 DIAGNOSIS — R079 Chest pain, unspecified: Secondary | ICD-10-CM | POA: Diagnosis not present

## 2014-06-23 DIAGNOSIS — W19XXXA Unspecified fall, initial encounter: Secondary | ICD-10-CM

## 2014-06-23 DIAGNOSIS — Z79899 Other long term (current) drug therapy: Secondary | ICD-10-CM | POA: Insufficient documentation

## 2014-06-23 DIAGNOSIS — E039 Hypothyroidism, unspecified: Secondary | ICD-10-CM | POA: Insufficient documentation

## 2014-06-23 DIAGNOSIS — I10 Essential (primary) hypertension: Secondary | ICD-10-CM | POA: Insufficient documentation

## 2014-06-23 DIAGNOSIS — Z792 Long term (current) use of antibiotics: Secondary | ICD-10-CM | POA: Insufficient documentation

## 2014-06-23 DIAGNOSIS — S279XXA Injury of unspecified intrathoracic organ, initial encounter: Secondary | ICD-10-CM | POA: Diagnosis not present

## 2014-06-23 DIAGNOSIS — S29001A Unspecified injury of muscle and tendon of front wall of thorax, initial encounter: Secondary | ICD-10-CM | POA: Diagnosis present

## 2014-06-23 DIAGNOSIS — K219 Gastro-esophageal reflux disease without esophagitis: Secondary | ICD-10-CM | POA: Diagnosis not present

## 2014-06-23 LAB — CBG MONITORING, ED: Glucose-Capillary: 109 mg/dL — ABNORMAL HIGH (ref 70–99)

## 2014-06-23 MED ORDER — TRAMADOL HCL 50 MG PO TABS
50.0000 mg | ORAL_TABLET | Freq: Once | ORAL | Status: AC
  Administered 2014-06-23: 50 mg via ORAL
  Filled 2014-06-23: qty 1

## 2014-06-23 MED ORDER — TRAMADOL HCL 50 MG PO TABS: 50.0000 mg | ORAL_TABLET | Freq: Four times a day (QID) | ORAL | Status: DC | PRN

## 2014-06-23 NOTE — ED Notes (Signed)
Bed: WA04 Expected date:  Expected time:  Means of arrival:  Comments: EMS 81 yo female [place]

## 2014-06-23 NOTE — ED Notes (Signed)
Pt alert, NAD, calm, interactive, appropriate, c/o continued pain (R ribs and R arm), mouth dry, speech clear. MAEx4, generally weak. PERRL 77mm brisk.

## 2014-06-23 NOTE — ED Provider Notes (Signed)
CSN: [id number]     Arrival date & time 06/23/14  1947 History   First MD Initiated Contact with Patient 06/23/14 2033     Chief Complaint  Patient presents with  . Fall     (Consider location/radiation/quality/duration/timing/severity/associated sxs/prior Treatment) Patient is a 81 y.o. female presenting with fall. The history is provided by the patient.  Fall Pertinent negatives include no abdominal pain, no headaches and no shortness of breath.  pt c/o trip and fall just pta this evening. Sat down, hit edge of chair, fell from chair to floor and chair fell against right chest. C/o right mid to lower chest wall pain. Moderate. Constant. Worse w movement, change of position, and palpation area. No sob. Denies any faintness or dizziness prior to fall. No loc w fall. Denies head injury or headache. No neck or back pain. No abd pain. No nv. Denies extremity pain or injury. Skin intact.  States prior to fall, recent health at baseline, states felt fine/normal earlier today.      Past Medical History  Diagnosis Date  . Hypertension   . Hypothyroidism   . Diastolic dysfunction     a. 04/2013 Echo: EF 60-65%, No rwma, Gr 1 DD, mild MR.  Marland Kitchen GERD (gastroesophageal reflux disease)   . Abnormal ECG     a. Pt aware of long hx of abnl ecg->h/o normal stress testing in [place] ~ 2000.   Past Surgical History  Procedure Laterality Date  . Joint replacement      a. knee left - injury resulted after being attacked by a dog.  . Abdominal hysterectomy     Family History  Problem Relation Age of Onset  . Diabetes type II Father     died @ 69.  . Breast cancer Sister     died @ 35.  Marland Kitchen Heart attack Mother     died @ 37.   History  Substance Use Topics  . Smoking status: Never Smoker   . Smokeless tobacco: Never Used  . Alcohol Use: No   OB History    No data available     Review of Systems  Constitutional: Negative for fever and chills.  HENT: Negative for sore throat.    Eyes: Negative for pain and visual disturbance.  Respiratory: Negative for cough and shortness of breath.   Cardiovascular: Negative for leg swelling.       Contusion right chest. No other recent chest pain.   Gastrointestinal: Negative for nausea, vomiting and abdominal pain.  Genitourinary: Negative for flank pain.  Musculoskeletal: Negative for back pain and neck pain.  Skin: Negative for wound.  Neurological: Negative for weakness, numbness and headaches.  Hematological: Does not bruise/bleed easily.  Psychiatric/Behavioral: Negative for confusion.      Allergies  Review of patient's allergies indicates no known allergies.  Home Medications   Prior to Admission medications   Medication Sig Start Date End Date Taking? Authorizing Provider  amLODipine (NORVASC) 2.5 MG tablet Take 1 tablet (2.5 mg total) by mouth daily. 05/07/13  Yes Orson Eva, MD  aspirin EC 81 MG tablet Take 1 tablet (81 mg total) by mouth daily. Patient taking differently: Take 81 mg by mouth at bedtime.  05/07/13  Yes Orson Eva, MD  Carboxymethylcellulose Sodium (REFRESH TEARS OP) Apply 1 drop to eye every morning.   Yes Historical Provider, MD  furosemide (LASIX) 20 MG tablet Take 1 tablet (20 mg total) by mouth daily. 05/07/13  Yes Orson Eva, MD  levothyroxine (  SYNTHROID, LEVOTHROID) 88 MCG tablet Take 88 mcg by mouth daily before breakfast.   Yes Historical Provider, MD  Multiple Vitamin (MULTIVITAMIN WITH MINERALS) TABS tablet Take 1 tablet by mouth daily.   Yes Historical Provider, MD  nabumetone (RELAFEN) 750 MG tablet Take 750 mg by mouth 2 (two) times daily as needed for mild pain.   Yes Historical Provider, MD  omeprazole (PRILOSEC) 20 MG capsule Take 20 mg by mouth daily.    Yes Historical Provider, MD  PRESCRIPTION MEDICATION Take 1 tablet by mouth daily. RX for bladder control   Yes Historical Provider, MD  azithromycin (ZITHROMAX) 500 MG tablet Take 1 tablet (500 mg total) by mouth daily. Patient not  taking: Reported on 05/19/2014 05/07/13   Orson Eva, MD  benzonatate (TESSALON) 200 MG capsule Take 1 capsule (200 mg total) by mouth 3 (three) times daily as needed for cough. Patient not taking: Reported on 06/23/2014 05/19/14   Domenic Moras, PA-C   BP 111/56 mmHg  Pulse 70  Temp(Src) 98.3 F (36.8 C) (Oral)  Resp 20  Ht 5\' 5"  (1.651 m)  Wt 180 lb (81.647 kg)  BMI 29.95 kg/m2  SpO2 95% Physical Exam  Constitutional: She is oriented to person, place, and time. She appears well-developed and well-nourished. No distress.  HENT:  Head: Atraumatic.  No scalp or facial sts or tenderness.   Eyes: Conjunctivae are normal. Pupils are equal, round, and reactive to light. No scleral icterus.  Neck: Normal range of motion. Neck supple. No tracheal deviation present.  Cardiovascular: Normal rate, regular rhythm, normal heart sounds and intact distal pulses.   Pulmonary/Chest: Effort normal and breath sounds normal. No respiratory distress. She exhibits tenderness.  Right lateral chest wall tenderness. Normal chest wall movement. No crepitus.   Abdominal: Soft. Normal appearance and bowel sounds are normal. She exhibits no distension. There is no tenderness.  No abd contusion, bruising or tenderness.   Genitourinary:  No cva tenderness  Musculoskeletal: She exhibits no edema.  CTLS spine, non tender, aligned, no step off. Good rom bil ext without pain or focal tenderness. Distal pulses palp.   Neurological: She is alert and oriented to person, place, and time.  Motor intact bil.   Skin: Skin is warm and dry. No rash noted. She is not diaphoretic.  Psychiatric: She has a normal mood and affect.  Nursing note and vitals reviewed.   ED Course  Procedures (including critical care time) Labs Review   Results for orders placed or performed during the hospital encounter of 06/23/14  CBG monitoring, ED  Result Value Ref Range   Glucose-Capillary 109 (H) 70 - 99 mg/dL   Dg Chest 2  View  06/23/2014   CLINICAL DATA:  Status post fall today.  Right chest pain.  EXAM: CHEST  2 VIEW  COMPARISON:  PA and lateral chest 05/19/2014.  FINDINGS: Cardiomegaly and hiatal hernia are again seen. The lungs are clear. No pneumothorax or pleural effusion. Mild mid thoracic compression fracture is again seen.  IMPRESSION: No acute abnormality.  Cardiomegaly.  Hiatal hernia.   Electronically Signed   By: Inge Rise M.D.   On: 06/23/2014 20:53   Dg Ribs Unilateral Right  06/23/2014   CLINICAL DATA:  Right anterior chest pain inferiorly and shortness of breath following a fall at home, hitting her right anterior chest on the arm of a wooden chair.  EXAM: RIGHT RIBS - 2 VIEW  COMPARISON:  Chest radiographs obtained earlier today.  FINDINGS: Three views  of the right ribs demonstrate no visible fractures or pneumothorax.  IMPRESSION: No fracture.   Electronically Signed   By: Claudie Revering M.D.   On: 06/23/2014 21:38       MDM   Xrays.  Ultram po.  Reviewed nursing notes and prior charts for additional history.   cxr neg acute.  Incentive spirometer.   Recheck pt comfortable. No increased wob. Has family here with her who can offer support.  Discussed importance of full/deep breathing to avoid risk pna.  Fall precautions.  Return precautions discussed.     Mirna Mires, MD 06/26/14 [phone]

## 2014-06-23 NOTE — Discharge Instructions (Signed)
It was our pleasure to provide your ER care today - we hope that you feel better.  Take your Relafen as need for pain.  You may also take ultram as need for pain - no driving when taking.  Stay active. Take full/deep breaths. Use incentive spirometer, 10 full and deep breaths in every 1-2 hours while awake.   Follow up with primary care doctor in coming week.  Return to ER if worse, new symptoms, fevers, trouble breathing, severe or new pain, other concern.       Chest Contusion A chest contusion is a deep bruise on your chest area. Contusions are the result of an injury that caused bleeding under the skin. A chest contusion may involve bruising of the skin, muscles, or ribs. The contusion may turn blue, purple, or yellow. Minor injuries will give you a painless contusion, but more severe contusions may stay painful and swollen for a few weeks. CAUSES  A contusion is usually caused by a blow, trauma, or direct force to an area of the body. SYMPTOMS   Swelling and redness of the injured area.  Discoloration of the injured area.  Tenderness and soreness of the injured area.  Pain. DIAGNOSIS  The diagnosis can be made by taking a history and performing a physical exam. An X-ray, CT scan, or MRI may be needed to determine if there were any associated injuries, such as broken bones (fractures) or internal injuries. TREATMENT  Often, the best treatment for a chest contusion is resting, icing, and applying cold compresses to the injured area. Deep breathing exercises may be recommended to reduce the risk of pneumonia. Over-the-counter medicines may also be recommended for pain control. HOME CARE INSTRUCTIONS   Put ice on the injured area.  Put ice in a plastic bag.  Place a towel between your skin and the bag.  Leave the ice on for 15-20 minutes, 03-04 times a day.  Only take over-the-counter or prescription medicines as directed by your caregiver. Your caregiver may recommend  avoiding anti-inflammatory medicines (aspirin, ibuprofen, and naproxen) for 48 hours because these medicines may increase bruising.  Rest the injured area.  Perform deep-breathing exercises as directed by your caregiver.  Stop smoking if you smoke.  Do not lift objects over 5 pounds (2.3 kg) for 3 days or longer if recommended by your caregiver. SEEK IMMEDIATE MEDICAL CARE IF:  1. You have increased bruising or swelling. 2. You have pain that is getting worse. 3. You have difficulty breathing. 4. You have dizziness, weakness, or fainting. 5. You have blood in your urine or stool. 6. You cough up or vomit blood. 7. Your swelling or pain is not relieved with medicines. MAKE SURE YOU:   Understand these instructions.  Will watch your condition.  Will get help right away if you are not doing well or get worse. Document Released: 01/16/2001 Document Revised: 01/16/2012 Document Reviewed: 10/15/2011 [facility] Patient Information 2015 [place], [place]. This information is not intended to replace advice given to you by your health care provider. Make sure you discuss any questions you have with your health care provider.    Incentive Spirometer An incentive spirometer is a tool that can help keep your lungs clear and active. This tool measures how well you are filling your lungs with each breath. Taking long, deep breaths may help reverse or decrease the chance of developing breathing (pulmonary) problems (especially infection) following:  Surgery of the chest or abdomen.  Surgery if you have a history  of smoking or a lung problem.  A long period of time when you are unable to move or be active. BEFORE THE PROCEDURE   If the spirometer includes an indicator to show your best effort, your nurse or respiratory therapist will set it to a desired goal.  If possible, sit up straight or lean slightly forward. Try not to slouch.  Hold the incentive spirometer in an upright  position. INSTRUCTIONS FOR USE  8. Sit on the edge of your bed if possible, or sit up as far as you can in bed or on a chair. 9. Hold the incentive spirometer in an upright position. 10. Breathe out normally. 11. Place the mouthpiece in your mouth and seal your lips tightly around it. 12. Breathe in slowly and as deeply as possible, raising the piston or the ball toward the top of the column. 13. Hold your breath for 3-5 seconds or for as long as possible. Allow the piston or ball to fall to the bottom of the column. 14. Remove the mouthpiece from your mouth and breathe out normally. 15. Rest for a few seconds and repeat Steps 1 through 7 at least 10 times every 1-2 hours when you are awake. Take your time and take a few normal breaths between deep breaths. 16. The spirometer may include an indicator to show your best effort. Use the indicator as a goal to work toward during each repetition. 17. After each set of 10 deep breaths, practice coughing to be sure your lungs are clear. If you have an incision (the cut made at the time of surgery), support your incision when coughing by placing a pillow or rolled-up towels firmly against it. Once you are able to get out of bed, walk around indoors and cough well. You may stop using the incentive spirometer when instructed by your caregiver.  RISKS AND COMPLICATIONS  Breathing too quickly may cause dizziness. At an extreme, this could cause you to pass out. Take your time so you do not get dizzy or light-headed.  If you are in pain, you may need to take or ask for pain medication before doing incentive spirometry. It is harder to take a deep breath if you are having pain. AFTER USE  Rest and breathe slowly and easily.  It can be helpful to keep a log of your progress. Your caregiver can provide you with a simple table to help with this. If you are using the spirometer at home, follow these instructions: Ellen Chandler IF:   You are having  difficultly using the spirometer.  You have trouble using the spirometer as often as instructed.  Your pain medication is not giving enough relief while using the spirometer.  You develop fever of 100.52F (38.1C) or higher. SEEK IMMEDIATE MEDICAL CARE IF:   You cough up bloody sputum that had not been present before.  You develop fever of 102F (38.9C) or greater.  You develop worsening pain at or near the incision site. MAKE SURE YOU:   Understand these instructions.  Will watch your condition.  Will get help right away if you are not doing well or get worse. Document Released: 09/03/2006 Document Revised: 09/07/2013 Document Reviewed: 11/04/2006 [facility] Patient Information 2015 [place], [place]. This information is not intended to replace advice given to you by your health care provider. Make sure you discuss any questions you have with your health care provider.

## 2014-06-23 NOTE — ED Notes (Signed)
After d/c with daughter and placement in w/c (by previous RN), pt had vagal episode, with dizziness, weakness, slower responses, pale and diaphoretic, EDP and CN notified. CN to BS. no LOC. Pt returned to stretcher and monitor with new gown. CBG checked. Pt lying, more alert with improved color, VSS. Will continue to monitor.

## 2014-06-23 NOTE — ED Notes (Signed)
Patient fell off the side of a chair, and the chair fell on her. Patient A&O complaining of right rib pain. Denies LOC. 18g to right forearm via [facility]

## 2014-06-24 MED ORDER — ONDANSETRON HCL 4 MG/2ML IJ SOLN
4.0000 mg | Freq: Once | INTRAMUSCULAR | Status: AC
  Administered 2014-06-24: 4 mg via INTRAVENOUS

## 2014-06-24 MED ORDER — SODIUM CHLORIDE 0.9 % IV BOLUS (SEPSIS)
1000.0000 mL | Freq: Once | INTRAVENOUS | Status: AC
  Administered 2014-06-23: 1000 mL via INTRAVENOUS

## 2014-06-24 MED ORDER — KETOROLAC TROMETHAMINE 30 MG/ML IJ SOLN
30.0000 mg | Freq: Once | INTRAMUSCULAR | Status: AC
  Administered 2014-06-24: 30 mg via INTRAVENOUS
  Filled 2014-06-24: qty 1

## 2014-06-24 NOTE — ED Notes (Signed)
Mouth swabbed. Pt more appropriate and interactive, VSS/ improved.

## 2014-06-24 NOTE — ED Notes (Signed)
Out in w/c with EMT and DIL.

## 2014-06-24 NOTE — ED Notes (Addendum)
Nauseated while sitting in chair. Returned to bed, VSS/BP stable. zofran given, family at [place] aware.

## 2014-06-24 NOTE — ED Notes (Addendum)
Remains alert, NAD, calm, interactive, resps e/u, speaking in clear complete sentences, VSS, 92% RA. Family at [facility]. Assessing orthostatic VSs at this time.

## 2014-06-24 NOTE — ED Notes (Signed)
Dr. Otter at BS. 

## 2014-06-24 NOTE — ED Notes (Addendum)
DIL back at [facility]. Snack and drink given per request. VSS/ improved. O2 removed for trial. HOB 30 degrees.

## 2014-06-24 NOTE — ED Notes (Signed)
Pt up to b/r with walker. States, "she has a walker at home that she uses some time". Steady gait with walker, VSS ambulating. DIL with pt to b/r. Denies dizziness. Admits to some nausea.

## 2014-06-24 NOTE — ED Notes (Signed)
Remains alert, NAD, calm, interactive, speech clear, mouth dry, "feeling better", BP remains low, improving with IVF bolus.

## 2014-06-24 NOTE — ED Notes (Signed)
Finished eating, family at [facility], no changes, "feels better".

## 2014-06-24 NOTE — ED Provider Notes (Signed)
Patient discharged by Dr Ashok Cordia, had vagal episode when getting up to wheelchair.  After IVF, meal, patient felt much better.  Shew as able to ambulate with walker, has same at home.    Kalman Drape, MD 06/24/14 [phone]

## 2014-06-25 ENCOUNTER — Ambulatory Visit (HOSPITAL_COMMUNITY): Payer: Medicare Other

## 2014-06-25 ENCOUNTER — Ambulatory Visit (HOSPITAL_COMMUNITY): Payer: PPO

## 2014-07-14 ENCOUNTER — Emergency Department (HOSPITAL_COMMUNITY)
Admission: EM | Admit: 2014-07-14 | Discharge: 2014-07-15 | Disposition: A | Discharge status: Home / Self Care | Payer: PPO | Attending: Emergency Medicine | Admitting: Emergency Medicine

## 2014-07-14 ENCOUNTER — Emergency Department (HOSPITAL_COMMUNITY): Payer: PPO

## 2014-07-14 DIAGNOSIS — Z7982 Long term (current) use of aspirin: Secondary | ICD-10-CM | POA: Diagnosis not present

## 2014-07-14 DIAGNOSIS — Y998 Other external cause status: Secondary | ICD-10-CM | POA: Insufficient documentation

## 2014-07-14 DIAGNOSIS — Z043 Encounter for examination and observation following other accident: Secondary | ICD-10-CM | POA: Insufficient documentation

## 2014-07-14 DIAGNOSIS — R0781 Pleurodynia: Secondary | ICD-10-CM | POA: Diagnosis not present

## 2014-07-14 DIAGNOSIS — E039 Hypothyroidism, unspecified: Secondary | ICD-10-CM | POA: Diagnosis not present

## 2014-07-14 DIAGNOSIS — F10120 Alcohol abuse with intoxication, uncomplicated: Secondary | ICD-10-CM | POA: Diagnosis not present

## 2014-07-14 DIAGNOSIS — I10 Essential (primary) hypertension: Secondary | ICD-10-CM | POA: Insufficient documentation

## 2014-07-14 DIAGNOSIS — Y939 Activity, unspecified: Secondary | ICD-10-CM | POA: Diagnosis not present

## 2014-07-14 DIAGNOSIS — W19XXXA Unspecified fall, initial encounter: Secondary | ICD-10-CM

## 2014-07-14 DIAGNOSIS — Z79899 Other long term (current) drug therapy: Secondary | ICD-10-CM | POA: Diagnosis not present

## 2014-07-14 DIAGNOSIS — Y929 Unspecified place or not applicable: Secondary | ICD-10-CM | POA: Diagnosis not present

## 2014-07-14 DIAGNOSIS — F10920 Alcohol use, unspecified with intoxication, uncomplicated: Secondary | ICD-10-CM

## 2014-07-14 DIAGNOSIS — S279XXA Injury of unspecified intrathoracic organ, initial encounter: Secondary | ICD-10-CM | POA: Diagnosis not present

## 2014-07-14 LAB — CBC WITH DIFFERENTIAL/PLATELET
BASOS ABS: 0.1 10*3/uL (ref 0.0–0.1)
BASOS PCT: 1 % (ref 0–1)
EOS PCT: 2 % (ref 0–5)
Eosinophils Absolute: 0.1 10*3/uL (ref 0.0–0.7)
HCT: 34.8 % — ABNORMAL LOW (ref 36.0–46.0)
Hemoglobin: 12.2 g/dL (ref 12.0–15.0)
LYMPHS ABS: 3.3 10*3/uL (ref 0.7–4.0)
Lymphocytes Relative: 45 % (ref 12–46)
MCH: 33.1 pg (ref 26.0–34.0)
MCHC: 35.1 g/dL (ref 30.0–36.0)
MCV: 94.3 fL (ref 78.0–100.0)
MONO ABS: 0.7 10*3/uL (ref 0.1–1.0)
MONOS PCT: 10 % (ref 3–12)
NEUTROS ABS: 2.9 10*3/uL (ref 1.7–7.7)
NEUTROS PCT: 42 % — AB (ref 43–77)
PLATELETS: 348 10*3/uL (ref 150–400)
RBC: 3.69 MIL/uL — ABNORMAL LOW (ref 3.87–5.11)
RDW: 12.1 % (ref 11.5–15.5)
WBC: 7 10*3/uL (ref 4.0–10.5)

## 2014-07-14 LAB — COMPREHENSIVE METABOLIC PANEL
ALBUMIN: 3.7 g/dL (ref 3.5–5.2)
ALT: 21 U/L (ref 0–35)
AST: 29 U/L (ref 0–37)
Alkaline Phosphatase: 65 U/L (ref 39–117)
Anion gap: 15 (ref 5–15)
BUN: 15 mg/dL (ref 6–23)
CALCIUM: 8.7 mg/dL (ref 8.4–10.5)
CHLORIDE: 89 mmol/L — AB (ref 96–112)
CO2: 22 mmol/L (ref 19–32)
Creatinine, Ser: 0.81 mg/dL (ref 0.50–1.10)
GFR calc Af Amer: 77 mL/min — ABNORMAL LOW (ref >90)
GFR calc non Af Amer: 66 mL/min — ABNORMAL LOW (ref >90)
GLUCOSE: 86 mg/dL (ref 70–99)
Potassium: 4 mmol/L (ref 3.5–5.1)
Sodium: 126 mmol/L — ABNORMAL LOW (ref 135–145)
Total Bilirubin: 0.6 mg/dL (ref 0.3–1.2)
Total Protein: 6.5 g/dL (ref 6.0–8.3)

## 2014-07-14 LAB — ETHANOL: ALCOHOL ETHYL (B): 230 mg/dL — AB (ref 0–9)

## 2014-07-14 NOTE — ED Notes (Signed)
Bed: [facility] Expected date:  Expected time:  Means of arrival:  Comments: EMS-fall 

## 2014-07-14 NOTE — ED Notes (Signed)
Patient transported to CT 

## 2014-07-14 NOTE — ED Notes (Signed)
Patient's medical alert alarm activated for EMS to come to residence Patient slid to floor off of couch EMS states that patient was not able to stand up on her own Patient did not hit head, No LOC, No N/V after fall occurred Patient here for c/o "weakness in the legs" Patient with hx of chronic pain due to multiple rib fx

## 2014-07-14 NOTE — ED Notes (Signed)
Patient is aware that we need urine, she is unable to provide specimen at this time.

## 2014-07-15 LAB — RAPID URINE DRUG SCREEN, HOSP PERFORMED
Amphetamines: NOT DETECTED
Barbiturates: NOT DETECTED
Benzodiazepines: NOT DETECTED
Cocaine: NOT DETECTED
Opiates: NOT DETECTED
Tetrahydrocannabinol: NOT DETECTED

## 2014-07-15 NOTE — Discharge Instructions (Signed)
Alcohol Intoxication  Alcohol intoxication occurs when the amount of alcohol that a person has consumed impairs his or her ability to mentally and physically function. Alcohol directly impairs the normal chemical activity of the brain. Drinking large amounts of alcohol can lead to changes in mental function and behavior, and it can cause many physical effects that can be harmful.   Alcohol intoxication can range in severity from mild to very severe. Various factors can affect the level of intoxication that occurs, such as the person's age, gender, weight, frequency of alcohol consumption, and the presence of other medical conditions (such as diabetes, seizures, or heart conditions). Dangerous levels of alcohol intoxication may occur when people drink large amounts of alcohol in a short period (binge drinking). Alcohol can also be especially dangerous when combined with certain prescription medicines or "recreational" drugs.  SIGNS AND SYMPTOMS  Some common signs and symptoms of mild alcohol intoxication include:  · Loss of coordination.  · Changes in mood and behavior.  · Impaired judgment.  · Slurred speech.  As alcohol intoxication progresses to more severe levels, other signs and symptoms will appear. These may include:  · Vomiting.  · Confusion and impaired memory.  · Slowed breathing.  · Seizures.  · Loss of consciousness.  DIAGNOSIS   Your health care provider will take a medical history and perform a physical exam. You will be asked about the amount and type of alcohol you have consumed. Blood tests will be done to measure the concentration of alcohol in your blood. In many places, your blood alcohol level must be lower than 80 mg/dL (0.08%) to legally drive. However, many dangerous effects of alcohol can occur at much lower levels.   TREATMENT   People with alcohol intoxication often do not require treatment. Most of the effects of alcohol intoxication are temporary, and they go away as the alcohol naturally  leaves the body. Your health care provider will monitor your condition until you are stable enough to go home. Fluids are sometimes given through an IV access tube to help prevent dehydration.   HOME CARE INSTRUCTIONS  · Do not drive after drinking alcohol.  · Stay hydrated. Drink enough water and fluids to keep your urine clear or pale yellow. Avoid caffeine.    · Only take over-the-counter or prescription medicines as directed by your health care provider.    SEEK MEDICAL CARE IF:   · You have persistent vomiting.    · You do not feel better after a few days.  · You have frequent alcohol intoxication. Your health care provider can help determine if you should see a substance use treatment counselor.  SEEK IMMEDIATE MEDICAL CARE IF:   · You become shaky or tremble when you try to stop drinking.    · You shake uncontrollably (seizure).    · You throw up (vomit) blood. This may be bright red or may look like black coffee grounds.    · You have blood in your stool. This may be bright red or may appear as a black, tarry, bad smelling stool.    · You become lightheaded or faint.    MAKE SURE YOU:   · Understand these instructions.  · Will watch your condition.  · Will get help right away if you are not doing well or get worse.  Document Released: 01/31/2005 Document Revised: 12/24/2012 Document Reviewed: 09/26/2012  [employer]® Patient Information ©2015 [employer]. This information is not intended to replace advice given to you by your health care provider. Make sure   you discuss any questions you have with your health care provider.

## 2014-07-15 NOTE — ED Notes (Signed)
Pt ambulated with a steady gait, no complaints of dizziness.

## 2014-07-15 NOTE — ED Notes (Signed)
Provided patient a cup of ice water and repositioned patient up in bed.

## 2014-07-15 NOTE — ED Provider Notes (Signed)
CSN: [id number]     Arrival date & time 07/14/14  2017 History   First MD Initiated Contact with Patient 07/14/14 2115     Chief Complaint  Patient presents with  . Fall     The history is provided by the patient.   patient is brought to the emergency department after patient had alerted the medical team that she had fallen and was unable to get up.  Family members report that when they present to the patient's house at the same time as EMS the patient was found lying near the couch.  The patient is unsure how this occurred.  Family is concerned that maybe she slid off the couch.  Patient denies headache or neck pain at this time.  Patient does report a new tremor right upper extremity.  Family reports that she intermittently binge drinks.  No history of seizures.  Patient denies urinary complaints.  She denies abdominal pain.  She denies back pain.  She states that when she got up to try to walk after EMS had arrived that she just felt weak all over.  Past medical history: Hypertension, hypothyroidism, peripheral edema Past surgical history: Noncontributory Family history: Noncontributory Social history: Drinks occasionally, denies drugs  Review of Systems  All other systems reviewed and are negative.     Allergies  Review of patient's allergies indicates no known allergies.  Home Medications   Prior to Admission medications   Medication Sig Start Date End Date Taking? Authorizing Provider  amLODipine (NORVASC) 2.5 MG tablet Take 2.5 mg by mouth daily.   Yes Historical Provider, MD  aspirin 81 MG tablet Take 81 mg by mouth daily.   Yes Historical Provider, MD  furosemide (LASIX) 20 MG tablet Take 20 mg by mouth 2 (two) times daily.   Yes Historical Provider, MD  levothyroxine (SYNTHROID, LEVOTHROID) 100 MCG tablet Take 100 mcg by mouth daily before breakfast.   Yes Historical Provider, MD  Multiple Vitamins-Minerals (MULTIVITAMIN & MINERAL PO) Take 1 tablet by mouth daily.   Yes  Historical Provider, MD  omeprazole (PRILOSEC) 20 MG capsule Take 20 mg by mouth daily as needed (indigestion).   Yes Historical Provider, MD   BP 104/53 mmHg  Pulse 73  Temp(Src) 98.6 F (37 C) (Oral)  Resp 18  SpO2 93% Physical Exam  Constitutional: She is oriented to person, place, and time. She appears well-developed and well-nourished. No distress.  HENT:  Head: Normocephalic and atraumatic.  Eyes: EOM are normal.  Neck: Normal range of motion.  Cardiovascular: Normal rate, regular rhythm and normal heart sounds.   Pulmonary/Chest: Effort normal and breath sounds normal.  Abdominal: Soft. She exhibits no distension. There is no tenderness.  Musculoskeletal: Normal range of motion.  Neurological: She is alert and oriented to person, place, and time.  Resting tremor right upper extremity that resolves with action.  Skin: Skin is warm and dry.  Psychiatric: She has a normal mood and affect. Judgment normal.  Nursing note and vitals reviewed.   ED Course  Procedures (including critical care time) Labs Review Labs Reviewed  CBC WITH DIFFERENTIAL/PLATELET - Abnormal; Notable for the following:    RBC 3.69 (*)    HCT 34.8 (*)    Neutrophils Relative % 42 (*)    All other components within normal limits  COMPREHENSIVE METABOLIC PANEL - Abnormal; Notable for the following:    Sodium 126 (*)    Chloride 89 (*)    GFR calc non Af Amer 66 (*)  GFR calc Af Amer 77 (*)    All other components within normal limits  ETHANOL - Abnormal; Notable for the following:    Alcohol, Ethyl (B) 230 (*)    All other components within normal limits  URINE RAPID DRUG SCREEN (HOSP PERFORMED)  URINALYSIS, ROUTINE W REFLEX MICROSCOPIC    Imaging Review Ct Head Wo Contrast  07/14/2014   CLINICAL DATA:  Fall, tremor of right upper extremity for 3 hours.  EXAM: CT HEAD WITHOUT CONTRAST  TECHNIQUE: Contiguous axial images were obtained from the base of the skull through the vertex without  intravenous contrast.  COMPARISON:  None.  FINDINGS: No intracranial bleed. Age related volume loss. Moderate periventricular and deep white matter hypodensity, nonspecific but most commonly related to chronic small vessel ischemia. There are lacunar infarcts in the basal ganglia, right greater than left, and posterior limb of the right internal capsule. No CT findings of acute ischemia. No extra-axial fluid collection. The calvarium is intact. Included paranasal sinuses and mastoid air cells are well aerated.  IMPRESSION: 1.  No acute intracranial abnormality. 2. Chronic small vessel ischemic change and lacunar infarcts in the basal ganglia.   Electronically Signed   By: Jeb Levering M.D.   On: 07/14/2014 23:42  I personally reviewed the imaging tests through PACS system I reviewed available ER/hospitalization records through the EMR    EKG Interpretation None      MDM   Final diagnoses:  Alcohol intoxication, uncomplicated  Fall, initial encounter    Patient likely was not able stand on her own and fell off the couch today because of alcohol intoxication.  Her head CT is normal.  Her right upper extremity intermittent tremor has completely resolved.  Labs without other significant abnormalities.  Her sodium is 126.  This likely secondary to drinking alcohol.  I recommended oral hydration at home.    Jola Schmidt, MD 07/15/14 [phone]

## 2014-09-06 ENCOUNTER — Ambulatory Visit (HOSPITAL_COMMUNITY)
Admission: RE | Admit: 2014-09-06 | Discharge: 2014-09-06 | Disposition: A | Discharge status: Home / Self Care | Payer: PPO | Source: Ambulatory Visit | Attending: Internal Medicine | Admitting: Internal Medicine

## 2014-09-06 DIAGNOSIS — Z1231 Encounter for screening mammogram for malignant neoplasm of breast: Secondary | ICD-10-CM | POA: Diagnosis not present

## 2014-10-18 ENCOUNTER — Emergency Department (HOSPITAL_COMMUNITY): Payer: PPO

## 2014-10-18 ENCOUNTER — Encounter (HOSPITAL_COMMUNITY): Payer: Self-pay | Admitting: *Deleted

## 2014-10-18 ENCOUNTER — Emergency Department (HOSPITAL_COMMUNITY)
Admission: EM | Admit: 2014-10-18 | Discharge: 2014-10-19 | Disposition: A | Discharge status: Home / Self Care | Payer: PPO | Attending: Emergency Medicine | Admitting: Emergency Medicine

## 2014-10-18 DIAGNOSIS — Z79899 Other long term (current) drug therapy: Secondary | ICD-10-CM | POA: Diagnosis not present

## 2014-10-18 DIAGNOSIS — I10 Essential (primary) hypertension: Secondary | ICD-10-CM | POA: Diagnosis not present

## 2014-10-18 DIAGNOSIS — K219 Gastro-esophageal reflux disease without esophagitis: Secondary | ICD-10-CM | POA: Insufficient documentation

## 2014-10-18 DIAGNOSIS — W19XXXA Unspecified fall, initial encounter: Secondary | ICD-10-CM

## 2014-10-18 DIAGNOSIS — Z7982 Long term (current) use of aspirin: Secondary | ICD-10-CM | POA: Insufficient documentation

## 2014-10-18 DIAGNOSIS — W1839XA Other fall on same level, initial encounter: Secondary | ICD-10-CM | POA: Diagnosis not present

## 2014-10-18 DIAGNOSIS — S3991XA Unspecified injury of abdomen, initial encounter: Secondary | ICD-10-CM | POA: Diagnosis not present

## 2014-10-18 DIAGNOSIS — E039 Hypothyroidism, unspecified: Secondary | ICD-10-CM | POA: Insufficient documentation

## 2014-10-18 DIAGNOSIS — S79911A Unspecified injury of right hip, initial encounter: Secondary | ICD-10-CM | POA: Insufficient documentation

## 2014-10-18 DIAGNOSIS — Y998 Other external cause status: Secondary | ICD-10-CM | POA: Diagnosis not present

## 2014-10-18 DIAGNOSIS — Y92193 Bedroom in other specified residential institution as the place of occurrence of the external cause: Secondary | ICD-10-CM | POA: Diagnosis not present

## 2014-10-18 DIAGNOSIS — Y9301 Activity, walking, marching and hiking: Secondary | ICD-10-CM | POA: Diagnosis not present

## 2014-10-18 LAB — I-STAT CHEM 8, ED
BUN: 11 mg/dL (ref 6–20)
CALCIUM ION: 1.11 mmol/L — AB (ref 1.13–1.30)
CHLORIDE: 87 mmol/L — AB (ref 101–111)
Creatinine, Ser: 0.8 mg/dL (ref 0.44–1.00)
GLUCOSE: 136 mg/dL — AB (ref 65–99)
HCT: 43 % (ref 36.0–46.0)
Hemoglobin: 14.6 g/dL (ref 12.0–15.0)
POTASSIUM: 3.9 mmol/L (ref 3.5–5.1)
Sodium: 123 mmol/L — ABNORMAL LOW (ref 135–145)
TCO2: 19 mmol/L (ref 0–100)

## 2014-10-18 LAB — ETHANOL

## 2014-10-18 LAB — CBG MONITORING, ED: GLUCOSE-CAPILLARY: 133 mg/dL — AB (ref 65–99)

## 2014-10-18 MED ORDER — IOHEXOL 300 MG/ML  SOLN
100.0000 mL | Freq: Once | INTRAMUSCULAR | Status: AC | PRN
Start: 2014-10-18 — End: 2014-10-18
  Administered 2014-10-18: 100 mL via INTRAVENOUS

## 2014-10-18 MED ORDER — FENTANYL CITRATE (PF) 100 MCG/2ML IJ SOLN
50.0000 ug | Freq: Once | INTRAMUSCULAR | Status: AC
  Administered 2014-10-18: 50 ug via INTRAVENOUS
  Filled 2014-10-18: qty 2

## 2014-10-18 MED ORDER — IOHEXOL 300 MG/ML  SOLN: 25.0000 mL | Freq: Once | INTRAMUSCULAR | Status: DC | PRN

## 2014-10-18 NOTE — ED Notes (Signed)
Patient transported to X-ray 

## 2014-10-18 NOTE — ED Notes (Signed)
Pt was walking with her walker last night around 7 pm and fell in her bedroom.  Pt was unable to ambulate or get to a phone and lay on the floor until 4:00pm today.  Pt c/o generalized soreness, bi-lateral hip and buttocks and back 9/10.  Pt normally ambulates at home with a walker.  GEMS: 170/90, 80, 18, 96%

## 2014-10-18 NOTE — ED Notes (Signed)
PA at bedside.

## 2014-10-18 NOTE — ED Notes (Signed)
Patient transported to CT 

## 2014-10-18 NOTE — ED Provider Notes (Signed)
CSN: [id number]     Arrival date & time 10/18/14  1645 History   First MD Initiated Contact with Patient 10/18/14 1720     Chief Complaint  Patient presents with  . Fall     (Consider location/radiation/quality/duration/timing/severity/associated sxs/prior Treatment) HPI Ellen Chandler is a 81 y.o. female comes in for evaluation of a fall. Patient states last night at approximately 7 PM she was walking through her house with her walker in one hand in a glass of water in the other hand, stumbled and fell forward to the ground. No prodromal sensation before fall. She denies any loss of consciousness, nausea or vomiting, chest pain or shortness of breath. She reports she was unable to get up secondary to the pain in her legs and back. She reports laying there on the kitchen floor until approximately 4 PM today when her son and daughter-in-law found her at home and probably called EMS. At this time, patient complains of generalized soreness, hip pain and back pain that she rates as a 9/10. She has not tried anything to improve her symptoms. Resting improves her discomfort while palpation and ambulation exacerbate her symptoms.  Past Medical History  Diagnosis Date  . Hypertension   . Hypothyroidism   . Diastolic dysfunction     a. 04/2013 Echo: EF 60-65%, No rwma, Gr 1 DD, mild MR.  Marland Kitchen GERD (gastroesophageal reflux disease)   . Abnormal ECG     a. Pt aware of long hx of abnl ecg->h/o normal stress testing in [place] ~ 2000.   Past Surgical History  Procedure Laterality Date  . Joint replacement      a. knee left - injury resulted after being attacked by a dog.  . Abdominal hysterectomy     Family History  Problem Relation Age of Onset  . Diabetes type II Father     died @ 66.  . Breast cancer Sister     died @ 49.  Marland Kitchen Heart attack Mother     died @ 65.   History  Substance Use Topics  . Smoking status: Never Smoker   . Smokeless tobacco: Never Used  . Alcohol Use: No    OB History    No data available     Review of Systems A 10 point review of systems was completed and was negative except for pertinent positives and negatives as mentioned in the history of present illness     Allergies  Review of patient's allergies indicates no known allergies.  Home Medications   Prior to Admission medications   Medication Sig Start Date End Date Taking? Authorizing Provider  amLODipine (NORVASC) 2.5 MG tablet Take 2.5 mg by mouth daily.   Yes Historical Provider, MD  aspirin 81 MG tablet Take 81 mg by mouth daily.   Yes Historical Provider, MD  Carboxymethylcellulose Sodium (REFRESH TEARS OP) Apply 1 drop to eye every morning.   Yes Historical Provider, MD  furosemide (LASIX) 20 MG tablet Take 20 mg by mouth daily.    Yes Historical Provider, MD  levothyroxine (SYNTHROID, LEVOTHROID) 88 MCG tablet Take 88 mcg by mouth daily before breakfast.   Yes Historical Provider, MD  Multiple Vitamin (MULTIVITAMIN WITH MINERALS) TABS tablet Take 1 tablet by mouth daily.   Yes Historical Provider, MD  nabumetone (RELAFEN) 750 MG tablet Take 750 mg by mouth 2 (two) times daily as needed for mild pain.   Yes Historical Provider, MD  omeprazole (PRILOSEC) 20 MG capsule Take 20 mg  by mouth 2 (two) times daily before a meal.    Yes Historical Provider, MD  oxybutynin (DITROPAN-XL) 5 MG 24 hr tablet Take 5 mg by mouth 2 (two) times daily.   Yes Historical Provider, MD   BP 150/82 mmHg  Pulse 88  Temp(Src) 98.9 F (37.2 C) (Oral)  Resp 19  Ht 5\' 5"  (1.651 m)  Wt 170 lb (77.111 kg)  BMI 28.29 kg/m2  SpO2 95% Physical Exam  Constitutional: She is oriented to person, place, and time. She appears well-developed and well-nourished.  HENT:  Head: Normocephalic and atraumatic.  Dry, chapped lips.  Eyes: Conjunctivae are normal. Pupils are equal, round, and reactive to light. Right eye exhibits no discharge. Left eye exhibits no discharge. No scleral icterus.  Neck: Neck  supple.  Cardiovascular: Normal rate, regular rhythm and normal heart sounds.   Pulmonary/Chest: Effort normal and breath sounds normal. No respiratory distress. She has no wheezes. She has no rales.  Abdominal: Soft.  Diffuse tenderness throughout abdomen. Mild distention. No obvious lesions or deformities.  Musculoskeletal: She exhibits no tenderness.  CT LS Spine nontender Maintains full active range of motion of lower left extremity without any focal tenderness. Decreased active range of motion of right hip secondary to discomfort. No obvious deformities or lesions noted.  Neurological: She is alert and oriented to person, place, and time.  Cranial nerves 2-12 grossly intact. Patient is able to move right lower extremity upon command, but range of motion is decreased secondary to pain. Sensation intact  Skin: Skin is warm and dry. No rash noted.  Psychiatric: She has a normal mood and affect.  Nursing note and vitals reviewed.   ED Course  Procedures (including critical care time) Labs Review Labs Reviewed  CBG MONITORING, ED - Abnormal; Notable for the following:    Glucose-Capillary 133 (*)    All other components within normal limits  I-STAT CHEM 8, ED - Abnormal; Notable for the following:    Sodium 123 (*)    Chloride 87 (*)    Glucose, Bld 136 (*)    Calcium, Ion 1.11 (*)    All other components within normal limits  ETHANOL    Imaging Review Ct Abdomen Pelvis W Contrast  10/18/2014   CLINICAL DATA:  Status post fall the night of 10/17/2014. Patient found down today.  EXAM: CT ABDOMEN AND PELVIS WITH CONTRAST  TECHNIQUE: Multidetector CT imaging of the abdomen and pelvis was performed using the standard protocol following bolus administration of intravenous contrast.  CONTRAST:  100 mL OMNIPAQUE IOHEXOL 300 MG/ML  SOLN  COMPARISON:  None.  FINDINGS: Mild dependent atelectasis is seen the lung bases. Large hiatal hernia is identified. No pleural or pericardial effusion.  Heart size is mildly enlarged.  The gallbladder is mildly distended with a stone identified but no surrounding inflammatory change. There is diffuse fatty infiltration of the liver. The spleen, adrenal glands and kidneys appear normal.  There is marked distention of an otherwise unremarkable urinary bladder. The patient is status post hysterectomy. The stomach and small and large bowel appear normal. No lymphadenopathy or fluid is identified.  The patient has a mild superior endplate compression fracture of L2 which appears remote. Trace anterolisthesis L5 on S1 due to facet arthropathy is noted. Remote right seventh rib fracture is noted.  IMPRESSION: Mild superior endplate compression fracture of L2 appears remote. No evidence of acute trauma is identified.  Marked distention of the urinary bladder.  Fatty infiltration of the liver.  Cholelithiasis.  Hiatal hernia.   Electronically Signed   By: Inge Rise M.D.   On: 10/18/2014 20:56   Dg Hip Unilat With Pelvis 2-3 Views Right  10/18/2014   CLINICAL DATA:  Fall, right hip pain  EXAM: RIGHT HIP (WITH PELVIS) 2-3 VIEWS  COMPARISON:  None.  FINDINGS: No fracture or dislocation is seen.  Bilateral hip joint spaces are symmetric.  Visualized bony pelvis appears intact.  Mild degenerative changes of the lower lumbar spine.  IMPRESSION: No fracture or dislocation is seen.   Electronically Signed   By: Julian Hy M.D.   On: 10/18/2014 18:23     EKG Interpretation None     Meds given in ED:  Medications  fentaNYL (SUBLIMAZE) injection 50 mcg (50 mcg Intravenous Given 10/18/14 1745)  iohexol (OMNIPAQUE) 300 MG/ML solution 100 mL (100 mLs Intravenous Contrast Given 10/18/14 2025)  fentaNYL (SUBLIMAZE) injection 50 mcg (50 mcg Intravenous Given 10/18/14 2119)    New Prescriptions   No medications on file   Filed Vitals:   10/18/14 2230 10/18/14 2249 10/18/14 2300 10/18/14 2330  BP: 163/76 163/76 150/81 150/82  Pulse: 95 95 88   Temp:       TempSrc:      Resp: 17 22 20 19   Height:      Weight:      SpO2: 95% 95% 95%     MDM  Patient here for evaluation after a mechanical fall. Vitals stable - WNL -afebrile Pt resting comfortably in ED. States she feels well and is ready to go home. PE--physical exam is grossly benign. Patient is able to ambulate independently with her walker as she does at home. Labwork--labs are noncontributory and baseline for patient. Patient appears to have chronic hyponatremia at baseline. Imaging--CT abdomen shows distention of urinary bladder, remote mild superior endplate compression fracture of L2 no other acute pathology.  Doubt any emergent or acute cause for patient's fall today. No evidence of central lesion. Fall likely mechanical in nature. Patient is able to ambulate independently throughout the ED with assistance from her walker. Son is at bedside who is taking care of the patient. I discussed all relevant lab findings and imaging results with pt and they verbalized understanding. Discussed f/u with PCP within 48 hrs and return precautions, pt very amenable to plan. Prior to patient discharge, I discussed and reviewed this case with Dr. Wilson Singer, who also saw and evaluated the patient.   Final diagnoses:  Fall, initial encounter        Comer Locket, PA-C 10/19/14 4315  Virgel Manifold, MD 10/20/14 [phone]

## 2014-10-19 NOTE — ED Notes (Signed)
Pt had incontinent episode while getting up to ambulate; pt states she has hx and takes home meds for

## 2014-10-19 NOTE — Discharge Instructions (Signed)
You were evaluated in the ED today after fall. There does not appear to be any emergent causes or symptoms related to your fall. Is important for you to follow-up with your primary care for further evaluation and management of your symptoms. Return to ED for new or worsening symptoms.  Fall Prevention and Home Safety Falls cause injuries and can affect all age groups. It is possible to use preventive measures to significantly decrease the likelihood of falls. There are many simple measures which can make your home safer and prevent falls. OUTDOORS  Repair cracks and edges of walkways and driveways.  Remove high doorway thresholds.  Trim shrubbery on the main path into your home.  Have good outside lighting.  Clear walkways of tools, rocks, debris, and clutter.  Check that handrails are not broken and are securely fastened. Both sides of steps should have handrails.  Have leaves, snow, and ice cleared regularly.  Use sand or salt on walkways during winter months.  In the garage, clean up grease or oil spills. BATHROOM  Install night lights.  Install grab bars by the toilet and in the tub and shower.  Use non-skid mats or decals in the tub or shower.  Place a plastic non-slip stool in the shower to sit on, if needed.  Keep floors dry and clean up all water on the floor immediately.  Remove soap buildup in the tub or shower on a regular basis.  Secure bath mats with non-slip, double-sided rug tape.  Remove throw rugs and tripping hazards from the floors. BEDROOMS  Install night lights.  Make sure a bedside light is easy to reach.  Do not use oversized bedding.  Keep a telephone by your bedside.  Have a firm chair with side arms to use for getting dressed.  Remove throw rugs and tripping hazards from the floor. KITCHEN  Keep handles on pots and pans turned toward the center of the stove. Use back burners when possible.  Clean up spills quickly and allow time for  drying.  Avoid walking on wet floors.  Avoid hot utensils and knives.  Position shelves so they are not too high or low.  Place commonly used objects within easy reach.  If necessary, use a sturdy step stool with a grab bar when reaching.  Keep electrical cables out of the way.  Do not use floor polish or wax that makes floors slippery. If you must use wax, use non-skid floor wax.  Remove throw rugs and tripping hazards from the floor. STAIRWAYS  Never leave objects on stairs.  Place handrails on both sides of stairways and use them. Fix any loose handrails. Make sure handrails on both sides of the stairways are as long as the stairs.  Check carpeting to make sure it is firmly attached along stairs. Make repairs to worn or loose carpet promptly.  Avoid placing throw rugs at the top or bottom of stairways, or properly secure the rug with carpet tape to prevent slippage. Get rid of throw rugs, if possible.  Have an electrician put in a light switch at the top and bottom of the stairs. OTHER FALL PREVENTION TIPS  Wear low-heel or rubber-soled shoes that are supportive and fit well. Wear closed toe shoes.  When using a stepladder, make sure it is fully opened and both spreaders are firmly locked. Do not climb a closed stepladder.  Add color or contrast paint or tape to grab bars and handrails in your home. Place contrasting color strips on  first and last steps.  Learn and use mobility aids as needed. Install an electrical emergency response system.  Turn on lights to avoid dark areas. Replace light bulbs that burn out immediately. Get light switches that glow.  Arrange furniture to create clear pathways. Keep furniture in the same place.  Firmly attach carpet with non-skid or double-sided tape.  Eliminate uneven floor surfaces.  Select a carpet pattern that does not visually hide the edge of steps.  Be aware of all pets. OTHER HOME SAFETY TIPS  Set the water temperature  for 120 F (48.8 C).  Keep emergency numbers on or near the telephone.  Keep smoke detectors on every level of the home and near sleeping areas. Document Released: 04/13/2002 Document Revised: 10/23/2011 Document Reviewed: 07/13/2011 [facility] Patient Information 2015 [place], [place]. This information is not intended to replace advice given to you by your health care provider. Make sure you discuss any questions you have with your health care provider.

## 2015-06-21 DIAGNOSIS — E785 Hyperlipidemia, unspecified: Secondary | ICD-10-CM | POA: Diagnosis not present

## 2015-06-21 DIAGNOSIS — Z0000 Encounter for general adult medical examination without abnormal findings: Secondary | ICD-10-CM | POA: Diagnosis not present

## 2015-06-21 DIAGNOSIS — Z1389 Encounter for screening for other disorder: Secondary | ICD-10-CM | POA: Diagnosis not present

## 2015-06-21 DIAGNOSIS — M8580 Other specified disorders of bone density and structure, unspecified site: Secondary | ICD-10-CM | POA: Diagnosis not present

## 2015-06-21 DIAGNOSIS — I129 Hypertensive chronic kidney disease with stage 1 through stage 4 chronic kidney disease, or unspecified chronic kidney disease: Secondary | ICD-10-CM | POA: Diagnosis not present

## 2015-07-07 DIAGNOSIS — I129 Hypertensive chronic kidney disease with stage 1 through stage 4 chronic kidney disease, or unspecified chronic kidney disease: Secondary | ICD-10-CM | POA: Diagnosis not present

## 2015-07-07 DIAGNOSIS — E039 Hypothyroidism, unspecified: Secondary | ICD-10-CM | POA: Diagnosis not present

## 2015-07-07 DIAGNOSIS — E785 Hyperlipidemia, unspecified: Secondary | ICD-10-CM | POA: Diagnosis not present

## 2015-07-07 DIAGNOSIS — N182 Chronic kidney disease, stage 2 (mild): Secondary | ICD-10-CM | POA: Diagnosis not present

## 2015-10-12 ENCOUNTER — Other Ambulatory Visit: Payer: Self-pay | Admitting: Internal Medicine

## 2015-10-12 DIAGNOSIS — Z1231 Encounter for screening mammogram for malignant neoplasm of breast: Secondary | ICD-10-CM

## 2015-10-17 ENCOUNTER — Ambulatory Visit
Admission: RE | Admit: 2015-10-17 | Discharge: 2015-10-17 | Disposition: A | Discharge status: Home / Self Care | Payer: PPO | Source: Ambulatory Visit | Attending: Internal Medicine | Admitting: Internal Medicine

## 2015-10-17 DIAGNOSIS — Z1231 Encounter for screening mammogram for malignant neoplasm of breast: Secondary | ICD-10-CM | POA: Diagnosis not present

## 2016-01-03 DIAGNOSIS — I129 Hypertensive chronic kidney disease with stage 1 through stage 4 chronic kidney disease, or unspecified chronic kidney disease: Secondary | ICD-10-CM | POA: Diagnosis not present

## 2016-01-03 DIAGNOSIS — M8580 Other specified disorders of bone density and structure, unspecified site: Secondary | ICD-10-CM | POA: Diagnosis not present

## 2016-01-03 DIAGNOSIS — E559 Vitamin D deficiency, unspecified: Secondary | ICD-10-CM | POA: Diagnosis not present

## 2016-01-10 DIAGNOSIS — I129 Hypertensive chronic kidney disease with stage 1 through stage 4 chronic kidney disease, or unspecified chronic kidney disease: Secondary | ICD-10-CM | POA: Diagnosis not present

## 2016-01-10 DIAGNOSIS — E785 Hyperlipidemia, unspecified: Secondary | ICD-10-CM | POA: Diagnosis not present

## 2016-01-10 DIAGNOSIS — Z23 Encounter for immunization: Secondary | ICD-10-CM | POA: Diagnosis not present

## 2016-01-10 DIAGNOSIS — E039 Hypothyroidism, unspecified: Secondary | ICD-10-CM | POA: Diagnosis not present

## 2016-01-10 DIAGNOSIS — N182 Chronic kidney disease, stage 2 (mild): Secondary | ICD-10-CM | POA: Diagnosis not present

## 2016-02-29 DIAGNOSIS — M79661 Pain in right lower leg: Secondary | ICD-10-CM | POA: Diagnosis not present

## 2016-02-29 DIAGNOSIS — I700 Atherosclerosis of aorta: Secondary | ICD-10-CM | POA: Diagnosis not present

## 2016-02-29 DIAGNOSIS — M79662 Pain in left lower leg: Secondary | ICD-10-CM | POA: Diagnosis not present

## 2016-02-29 DIAGNOSIS — I82402 Acute embolism and thrombosis of unspecified deep veins of left lower extremity: Secondary | ICD-10-CM | POA: Diagnosis not present

## 2016-02-29 DIAGNOSIS — I82401 Acute embolism and thrombosis of unspecified deep veins of right lower extremity: Secondary | ICD-10-CM | POA: Diagnosis not present

## 2016-02-29 DIAGNOSIS — I739 Peripheral vascular disease, unspecified: Secondary | ICD-10-CM | POA: Diagnosis not present

## 2016-04-12 DIAGNOSIS — Z1231 Encounter for screening mammogram for malignant neoplasm of breast: Secondary | ICD-10-CM | POA: Diagnosis not present

## 2016-06-17 ENCOUNTER — Encounter (HOSPITAL_COMMUNITY): Payer: Self-pay | Admitting: *Deleted

## 2016-06-17 ENCOUNTER — Inpatient Hospital Stay (HOSPITAL_COMMUNITY)
Admission: EM | Admit: 2016-06-17 | Discharge: 2016-06-20 | DRG: 641 | Disposition: A | Discharge status: Home / Self Care | Payer: PPO | Attending: Internal Medicine | Admitting: Internal Medicine

## 2016-06-17 ENCOUNTER — Emergency Department (HOSPITAL_COMMUNITY): Payer: PPO

## 2016-06-17 DIAGNOSIS — I959 Hypotension, unspecified: Secondary | ICD-10-CM | POA: Diagnosis present

## 2016-06-17 DIAGNOSIS — K219 Gastro-esophageal reflux disease without esophagitis: Secondary | ICD-10-CM | POA: Diagnosis present

## 2016-06-17 DIAGNOSIS — Z96652 Presence of left artificial knee joint: Secondary | ICD-10-CM

## 2016-06-17 DIAGNOSIS — R0789 Other chest pain: Secondary | ICD-10-CM | POA: Diagnosis present

## 2016-06-17 DIAGNOSIS — Z79899 Other long term (current) drug therapy: Secondary | ICD-10-CM | POA: Diagnosis not present

## 2016-06-17 DIAGNOSIS — N390 Urinary tract infection, site not specified: Secondary | ICD-10-CM | POA: Diagnosis present

## 2016-06-17 DIAGNOSIS — I10 Essential (primary) hypertension: Secondary | ICD-10-CM | POA: Diagnosis present

## 2016-06-17 DIAGNOSIS — Z7982 Long term (current) use of aspirin: Secondary | ICD-10-CM

## 2016-06-17 DIAGNOSIS — I5032 Chronic diastolic (congestive) heart failure: Secondary | ICD-10-CM | POA: Diagnosis not present

## 2016-06-17 DIAGNOSIS — E039 Hypothyroidism, unspecified: Secondary | ICD-10-CM | POA: Diagnosis present

## 2016-06-17 DIAGNOSIS — E869 Volume depletion, unspecified: Secondary | ICD-10-CM | POA: Diagnosis present

## 2016-06-17 DIAGNOSIS — R630 Anorexia: Secondary | ICD-10-CM | POA: Diagnosis not present

## 2016-06-17 DIAGNOSIS — I110 Hypertensive heart disease with heart failure: Secondary | ICD-10-CM | POA: Diagnosis not present

## 2016-06-17 DIAGNOSIS — R0602 Shortness of breath: Secondary | ICD-10-CM | POA: Diagnosis not present

## 2016-06-17 DIAGNOSIS — N3000 Acute cystitis without hematuria: Secondary | ICD-10-CM

## 2016-06-17 DIAGNOSIS — E860 Dehydration: Principal | ICD-10-CM | POA: Diagnosis present

## 2016-06-17 DIAGNOSIS — Z8249 Family history of ischemic heart disease and other diseases of the circulatory system: Secondary | ICD-10-CM | POA: Diagnosis not present

## 2016-06-17 DIAGNOSIS — R079 Chest pain, unspecified: Secondary | ICD-10-CM | POA: Diagnosis present

## 2016-06-17 DIAGNOSIS — F419 Anxiety disorder, unspecified: Secondary | ICD-10-CM | POA: Diagnosis not present

## 2016-06-17 DIAGNOSIS — E871 Hypo-osmolality and hyponatremia: Secondary | ICD-10-CM | POA: Diagnosis present

## 2016-06-17 DIAGNOSIS — N3090 Cystitis, unspecified without hematuria: Secondary | ICD-10-CM | POA: Diagnosis not present

## 2016-06-17 LAB — CBC
HCT: 33.4 % — ABNORMAL LOW (ref 36.0–46.0)
Hemoglobin: 12 g/dL (ref 12.0–15.0)
MCH: 32.3 pg (ref 26.0–34.0)
MCHC: 35.9 g/dL (ref 30.0–36.0)
MCV: 90 fL (ref 78.0–100.0)
Platelets: 336 10*3/uL (ref 150–400)
RBC: 3.71 MIL/uL — ABNORMAL LOW (ref 3.87–5.11)
RDW: 12 % (ref 11.5–15.5)
WBC: 8 10*3/uL (ref 4.0–10.5)

## 2016-06-17 LAB — BASIC METABOLIC PANEL
Anion gap: 11 (ref 5–15)
BUN: 11 mg/dL (ref 6–20)
CO2: 22 mmol/L (ref 22–32)
Calcium: 8.6 mg/dL — ABNORMAL LOW (ref 8.9–10.3)
Chloride: 90 mmol/L — ABNORMAL LOW (ref 101–111)
Creatinine, Ser: 1.04 mg/dL — ABNORMAL HIGH (ref 0.44–1.00)
GFR calc Af Amer: 56 mL/min — ABNORMAL LOW (ref >60)
GFR calc non Af Amer: 48 mL/min — ABNORMAL LOW (ref >60)
Glucose, Bld: 130 mg/dL — ABNORMAL HIGH (ref 65–99)
Potassium: 4 mmol/L (ref 3.5–5.1)
Sodium: 123 mmol/L — ABNORMAL LOW (ref 135–145)

## 2016-06-17 LAB — URINALYSIS, ROUTINE W REFLEX MICROSCOPIC
Bilirubin Urine: NEGATIVE
GLUCOSE, UA: NEGATIVE mg/dL
Hgb urine dipstick: NEGATIVE
KETONES UR: NEGATIVE mg/dL
Nitrite: POSITIVE — AB
PROTEIN: NEGATIVE mg/dL
RBC / HPF: NONE SEEN RBC/hpf (ref 0–5)
Specific Gravity, Urine: 1.004 — ABNORMAL LOW (ref 1.005–1.030)
Squamous Epithelial / LPF: NONE SEEN
pH: 6 (ref 5.0–8.0)

## 2016-06-17 LAB — I-STAT TROPONIN, ED
Troponin i, poc: 0.01 ng/mL (ref 0.00–0.08)
Troponin i, poc: 0.01 ng/mL (ref 0.00–0.08)

## 2016-06-17 LAB — D-DIMER, QUANTITATIVE (NOT AT ARMC)

## 2016-06-17 MED ORDER — MORPHINE SULFATE (PF) 4 MG/ML IV SOLN
4.0000 mg | Freq: Once | INTRAVENOUS | Status: AC
  Administered 2016-06-17: 4 mg via INTRAVENOUS
  Filled 2016-06-17: qty 1

## 2016-06-17 MED ORDER — ADULT MULTIVITAMIN W/MINERALS CH
1.0000 | ORAL_TABLET | Freq: Every day | ORAL | Status: DC
Start: 2016-06-18 — End: 2016-06-20
  Administered 2016-06-18 – 2016-06-20 (×3): 1 via ORAL
  Filled 2016-06-17 (×3): qty 1

## 2016-06-17 MED ORDER — EZETIMIBE 10 MG PO TABS
10.0000 mg | ORAL_TABLET | Freq: Every day | ORAL | Status: DC
  Administered 2016-06-18 – 2016-06-20 (×3): 10 mg via ORAL
  Filled 2016-06-17 (×3): qty 1

## 2016-06-17 MED ORDER — POLYVINYL ALCOHOL 1.4 % OP SOLN
1.0000 [drp] | Freq: Every day | OPHTHALMIC | Status: DC | PRN
  Filled 2016-06-17: qty 15

## 2016-06-17 MED ORDER — AMLODIPINE BESYLATE 5 MG PO TABS
5.0000 mg | ORAL_TABLET | Freq: Every day | ORAL | Status: DC
  Administered 2016-06-18 – 2016-06-19 (×2): 5 mg via ORAL
  Filled 2016-06-17 (×2): qty 1

## 2016-06-17 MED ORDER — DEXTROSE 5 % IV SOLN: 1.0000 g | INTRAVENOUS | Status: DC

## 2016-06-17 MED ORDER — NABUMETONE 750 MG PO TABS
750.0000 mg | ORAL_TABLET | Freq: Two times a day (BID) | ORAL | Status: DC | PRN
  Filled 2016-06-17: qty 1

## 2016-06-17 MED ORDER — ENOXAPARIN SODIUM 40 MG/0.4ML ~~LOC~~ SOLN
40.0000 mg | Freq: Every day | SUBCUTANEOUS | Status: DC
  Administered 2016-06-18 – 2016-06-19 (×3): 40 mg via SUBCUTANEOUS
  Filled 2016-06-17 (×3): qty 0.4

## 2016-06-17 MED ORDER — ASPIRIN 81 MG PO CHEW
81.0000 mg | CHEWABLE_TABLET | Freq: Every day | ORAL | Status: DC
  Administered 2016-06-18 – 2016-06-20 (×3): 81 mg via ORAL
  Filled 2016-06-17 (×3): qty 1

## 2016-06-17 MED ORDER — DEXTROSE 5 % IV SOLN
1.0000 g | Freq: Once | INTRAVENOUS | Status: AC
  Administered 2016-06-17: 1 g via INTRAVENOUS
  Filled 2016-06-17: qty 10

## 2016-06-17 MED ORDER — ONDANSETRON HCL 4 MG/2ML IJ SOLN
4.0000 mg | Freq: Once | INTRAMUSCULAR | Status: AC
  Administered 2016-06-17: 4 mg via INTRAVENOUS
  Filled 2016-06-17: qty 2

## 2016-06-17 MED ORDER — OXYBUTYNIN CHLORIDE ER 5 MG PO TB24
5.0000 mg | ORAL_TABLET | Freq: Two times a day (BID) | ORAL | Status: DC
  Administered 2016-06-18 – 2016-06-20 (×6): 5 mg via ORAL
  Filled 2016-06-17 (×6): qty 1

## 2016-06-17 MED ORDER — PANTOPRAZOLE SODIUM 40 MG PO TBEC
40.0000 mg | DELAYED_RELEASE_TABLET | Freq: Every day | ORAL | Status: DC
  Administered 2016-06-18 – 2016-06-20 (×3): 40 mg via ORAL
  Filled 2016-06-17 (×3): qty 1

## 2016-06-17 MED ORDER — ONDANSETRON HCL 4 MG/2ML IJ SOLN: 4.0000 mg | Freq: Four times a day (QID) | INTRAMUSCULAR | Status: DC | PRN

## 2016-06-17 MED ORDER — LEVOTHYROXINE SODIUM 88 MCG PO TABS
88.0000 ug | ORAL_TABLET | Freq: Every day | ORAL | Status: DC
  Administered 2016-06-18 – 2016-06-20 (×3): 88 ug via ORAL
  Filled 2016-06-17 (×3): qty 1

## 2016-06-17 MED ORDER — ASPIRIN 81 MG PO CHEW
324.0000 mg | CHEWABLE_TABLET | Freq: Once | ORAL | Status: AC
  Administered 2016-06-17: 324 mg via ORAL
  Filled 2016-06-17: qty 4

## 2016-06-17 MED ORDER — SODIUM CHLORIDE 0.9 % IV SOLN
INTRAVENOUS | Status: DC
  Administered 2016-06-18: 15:00:00 via INTRAVENOUS
  Administered 2016-06-18: 75 mL/h via INTRAVENOUS
  Administered 2016-06-19 (×2): via INTRAVENOUS

## 2016-06-17 MED ORDER — ACETAMINOPHEN 325 MG PO TABS: 650.0000 mg | ORAL_TABLET | ORAL | Status: DC | PRN

## 2016-06-17 NOTE — H&P (Signed)
History and Physical    Ellen Chandler [id number] DOB: Oct 17, 1930 DOA: 06/17/2016   PCP: Thressa Sheller, MD Chief Complaint:  Chief Complaint  Patient presents with  . Shortness of Breath    HPI: Ellen Chandler is a 83 y.o. female with medical history significant of diastolic CHF, no prior known CAD.  Patient presents to the ED with acute onset of generalized weakness and SOB.  Associated mild lower chest pain at onset that has now resolved.  Symptoms occurred while sitting and watching olympics.  No syncope.  No fevers nor chills nor cough.  No calf pain, no recent travel.  Patient states she felt like she had a UTI for past several days with dysuria, has been taking just Pyridium for that.  ED Course: UA c/w UTI.  Trop neg x2, D.Dimer neg.  EKG unchanged.  Patient became very SOB on ambulation with O2 sat drop to 91%.  Review of Systems: As per HPI otherwise 10 point review of systems negative.    Past Medical History:  Diagnosis Date  . Abnormal ECG    a. Pt aware of long hx of abnl ecg->h/o normal stress testing in [place] ~ 2000.  . Diastolic dysfunction    a. 04/2013 Echo: EF 60-65%, No rwma, Gr 1 DD, mild MR.  Marland Kitchen GERD (gastroesophageal reflux disease)   . Hypertension   . Hypothyroidism     Past Surgical History:  Procedure Laterality Date  . ABDOMINAL HYSTERECTOMY    . JOINT REPLACEMENT     a. knee left - injury resulted after being attacked by a dog.     reports that she has never smoked. She has never used smokeless tobacco. She reports that she does not drink alcohol or use drugs.  No Known Allergies  Family History  Problem Relation Age of Onset  . Heart attack Mother     died @ 82.  . Diabetes type II Father     died @ 70.  . Breast cancer Sister     died @ 76.      Prior to Admission medications   Medication Sig Start Date End Date Taking? Authorizing Provider  amLODipine (NORVASC) 5 MG tablet Take 5 mg by mouth daily. 03/20/16   Yes Historical Provider, MD  aspirin 81 MG tablet Take 81 mg by mouth daily.   Yes Historical Provider, MD  Carboxymethylcellulose Sodium (REFRESH TEARS OP) Place 1 drop into both eyes every morning.    Yes Historical Provider, MD  ezetimibe (ZETIA) 10 MG tablet Take 10 mg by mouth daily. 04/09/16  Yes Historical Provider, MD  furosemide (LASIX) 20 MG tablet Take 20 mg by mouth daily.    Yes Historical Provider, MD  levothyroxine (SYNTHROID, LEVOTHROID) 88 MCG tablet Take 88 mcg by mouth daily before breakfast.   Yes Historical Provider, MD  losartan-hydrochlorothiazide (HYZAAR) 50-12.5 MG tablet Take 1 tablet by mouth daily. 03/20/16  Yes Historical Provider, MD  Multiple Vitamin (MULTIVITAMIN WITH MINERALS) TABS tablet Take 1 tablet by mouth daily.   Yes Historical Provider, MD  nabumetone (RELAFEN) 750 MG tablet Take 750 mg by mouth 2 (two) times daily as needed for mild pain.   Yes Historical Provider, MD  omeprazole (PRILOSEC) 20 MG capsule Take 20 mg by mouth 2 (two) times daily before a meal.    Yes Historical Provider, MD  oxybutynin (DITROPAN-XL) 5 MG 24 hr tablet Take 5 mg by mouth 2 (two) times daily.   Yes Historical Provider, MD  Physical Exam: Vitals:   06/17/16 2030 06/17/16 2100 06/17/16 2130 06/17/16 2200  BP: 152/79 129/65 124/61 113/68  Pulse: 91 73 74 68  Resp: 22 13 13 14   Temp:      SpO2: 93% 95% 95% 95%      Constitutional: NAD, calm, comfortable Eyes: PERRL, lids and conjunctivae normal ENMT: Mucous membranes are moist. Posterior pharynx clear of any exudate or lesions.Normal dentition.  Neck: normal, supple, no masses, no thyromegaly Respiratory: Tachypnic with activity Cardiovascular: Regular rate and rhythm, no murmurs / rubs / gallops. No extremity edema. 2+ pedal pulses. No carotid bruits.  Abdomen: no tenderness, no masses palpated. No hepatosplenomegaly. Bowel sounds positive.  Musculoskeletal: no clubbing / cyanosis. No joint deformity upper and lower  extremities. Good ROM, no contractures. Normal muscle tone.  Skin: no rashes, lesions, ulcers. No induration Neurologic: CN 2-12 grossly intact. Sensation intact, DTR normal. Strength 5/5 in all 4.  Psychiatric: Normal judgment and insight. Alert and oriented x 3. Normal mood.    Labs on Admission: I have personally reviewed following labs and imaging studies  CBC:  Recent Labs Lab 06/17/16 1740  WBC 8.0  HGB 12.0  HCT 33.4*  MCV 90.0  PLT 123456   Basic Metabolic Panel:  Recent Labs Lab 06/17/16 1740  NA 123*  K 4.0  CL 90*  CO2 22  GLUCOSE 130*  BUN 11  CREATININE 1.04*  CALCIUM 8.6*   GFR: CrCl cannot be calculated (Unknown ideal weight.). Liver Function Tests: No results for input(s): AST, ALT, ALKPHOS, BILITOT, PROT, ALBUMIN in the last 168 hours. No results for input(s): LIPASE, AMYLASE in the last 168 hours. No results for input(s): AMMONIA in the last 168 hours. Coagulation Profile: No results for input(s): INR, PROTIME in the last 168 hours. Cardiac Enzymes: No results for input(s): CKTOTAL, CKMB, CKMBINDEX, TROPONINI in the last 168 hours. BNP (last 3 results) No results for input(s): PROBNP in the last 8760 hours. HbA1C: No results for input(s): HGBA1C in the last 72 hours. CBG: No results for input(s): GLUCAP in the last 168 hours. Lipid Profile: No results for input(s): CHOL, HDL, LDLCALC, TRIG, CHOLHDL, LDLDIRECT in the last 72 hours. Thyroid Function Tests: No results for input(s): TSH, T4TOTAL, FREET4, T3FREE, THYROIDAB in the last 72 hours. Anemia Panel: No results for input(s): VITAMINB12, FOLATE, FERRITIN, TIBC, IRON, RETICCTPCT in the last 72 hours. Urine analysis:    Component Value Date/Time   COLORURINE YELLOW 06/17/2016 1928   APPEARANCEUR CLEAR 06/17/2016 1928   LABSPEC 1.004 (L) 06/17/2016 1928   PHURINE 6.0 06/17/2016 1928   GLUCOSEU NEGATIVE 06/17/2016 1928   HGBUR NEGATIVE 06/17/2016 1928   BILIRUBINUR NEGATIVE 06/17/2016 1928     KETONESUR NEGATIVE 06/17/2016 1928   PROTEINUR NEGATIVE 06/17/2016 1928   UROBILINOGEN 0.2 05/19/2014 0635   NITRITE POSITIVE (A) 06/17/2016 1928   LEUKOCYTESUR LARGE (A) 06/17/2016 1928   Sepsis Labs: @LABRCNTIP (procalcitonin:4,lacticidven:4) )No results found for this or any previous visit (from the past 240 hour(s)).   Radiological Exams on Admission: Dg Chest 2 View  Result Date: 06/17/2016 CLINICAL DATA:  Left-sided chest pain shortness of Breath EXAM: CHEST  2 VIEW COMPARISON:  None. FINDINGS: Cardiac shadow is within normal limits. A large hiatal hernia is noted. The lungs are well aerated without focal infiltrate. Mid thoracic compression deformity is noted of uncertain age. Degenerative changes of the shoulder joints are seen. IMPRESSION: Chronic appearing changes as described. Electronically Signed   By: Inez Catalina M.D.   On:  06/17/2016 17:33    EKG: Independently reviewed.  Assessment/Plan Principal Problem:   Chest pain Active Problems:   Hyponatremia   Essential hypertension   UTI (urinary tract infection)    1. Chest pain and SOB - 1. CP obs pathway 2. Serial trops 3. Tele monitor 4. If persists probably get 2d echo as next step 5. Will let patient eat as I doubt that she will be being rushed to stress test or cath lab with negative trops and an active UTI tomorrow AM. 2. UTI - 1. Culture pending 2. Empiric rocephin 3. Informed patient and son that for future reference: pyridium doesn't actually treat UTIs (just pain associated with them). 3. Hyponatremia - unclear if chronic or acute, prior sodiums on file where usually low however so I suspect this may very well be a chronic issue for the patient. 1. Will hold lasix, lisinopril-HCTZ 2. Repeat BMP in AM 3. Gentle hydration with NS   DVT prophylaxis: Lovenox Code Status: Full Family Communication: Son at bedside Consults called: None Admission status: Place in [place], [place]  Hospitalists Pager [phone] from 7PM-7AM  If 7AM-7PM, please contact the day physician for the patient [email or web address] Password [facility]  06/17/2016, 10:55 PM

## 2016-06-17 NOTE — ED Notes (Signed)
Pt placed on bedpan. Pt voided about 500 cc.

## 2016-06-17 NOTE — ED Notes (Signed)
Pt ambulated to bathroom w/ 2 person assist.  O2 sat between 91-95% on RA.  Pt dyspneic during ambulation.

## 2016-06-17 NOTE — ED Provider Notes (Signed)
[place] DEPT Provider Note   CSN: [id number] Arrival date & time: 06/17/16  1647     History   Chief Complaint Chief Complaint  Patient presents with  . Shortness of Breath    HPI Ellen Chandler is a 83 y.o. female.  Patient with history of grade 1 diastolic heart failure, no previous ACS, remote normal stress test -- presents with acute onset of shortness of breath starting 1 hour prior to arrival while watching [place]. Patient had some mild lower chest pain at onset that has since resolved. No back pain. Patient did not pass out or feel lightheaded. She called her daughter who brought her to the emergency department. Patient denies swelling of her lower extremities, calf pain, difficulty lying flat. No recent fevers or cough. They went to urgent care initially to referred patient immediately to the emergency department. No treatments prior to arrival. Patient mildly hypoxic on arrival to the emergency department. No abdominal pains, vomiting, or diarrhea. Patient feels that she has had a urinary tract infection for the past several days and has taken Pyridium. No anticoagulation. Patient denies risk factors for pulmonary embolism including: unilateral leg swelling, history of DVT/PE/other blood clots, use of exogenous hormones, recent immobilizations, recent surgery, recent travel (>4hr segment), malignancy, hemoptysis.  The onset of this condition was acute. The course is constant. Aggravating factors: none. Alleviating factors: none.        Past Medical History:  Diagnosis Date  . Abnormal ECG    a. Pt aware of long hx of abnl ecg->h/o normal stress testing in [place] ~ 2000.  . Diastolic dysfunction    a. 04/2013 Echo: EF 60-65%, No rwma, Gr 1 DD, mild MR.  Marland Kitchen GERD (gastroesophageal reflux disease)   . Hypertension   . Hypothyroidism     Patient Active Problem List   Diagnosis Date Noted  . Nonspecific abnormal electrocardiogram (ECG) (EKG) 05/06/2013   . Acute bronchitis 05/06/2013  . Unspecified essential hypertension 05/06/2013  . Acute diastolic CHF (congestive heart failure) ([place]) 05/06/2013  . Dehydration 05/05/2013  . CAP (community acquired pneumonia) 05/05/2013  . Orthostatic hypotension 05/05/2013  . Hyponatremia 05/05/2013    Past Surgical History:  Procedure Laterality Date  . ABDOMINAL HYSTERECTOMY    . JOINT REPLACEMENT     a. knee left - injury resulted after being attacked by a dog.    OB History    No data available       Home Medications    Prior to Admission medications   Medication Sig Start Date End Date Taking? Authorizing Provider  amLODipine (NORVASC) 2.5 MG tablet Take 2.5 mg by mouth daily.    Historical Provider, MD  aspirin 81 MG tablet Take 81 mg by mouth daily.    Historical Provider, MD  Carboxymethylcellulose Sodium (REFRESH TEARS OP) Apply 1 drop to eye every morning.    Historical Provider, MD  furosemide (LASIX) 20 MG tablet Take 20 mg by mouth daily.     Historical Provider, MD  levothyroxine (SYNTHROID, LEVOTHROID) 88 MCG tablet Take 88 mcg by mouth daily before breakfast.    Historical Provider, MD  Multiple Vitamin (MULTIVITAMIN WITH MINERALS) TABS tablet Take 1 tablet by mouth daily.    Historical Provider, MD  nabumetone (RELAFEN) 750 MG tablet Take 750 mg by mouth 2 (two) times daily as needed for mild pain.    Historical Provider, MD  omeprazole (PRILOSEC) 20 MG capsule Take 20 mg by mouth 2 (two) times daily before  a meal.     Historical Provider, MD  oxybutynin (DITROPAN-XL) 5 MG 24 hr tablet Take 5 mg by mouth 2 (two) times daily.    Historical Provider, MD    Family History Family History  Problem Relation Age of Onset  . Heart attack Mother     died @ 52.  . Diabetes type II Father     died @ 70.  . Breast cancer Sister     died @ 15.    Social History Social History  Substance Use Topics  . Smoking status: Never Smoker  . Smokeless tobacco: Never Used  . Alcohol  use No     Allergies   Patient has no known allergies.   Review of Systems Review of Systems  Constitutional: Negative for fever.  HENT: Negative for rhinorrhea and sore throat.   Eyes: Negative for redness.  Respiratory: Positive for shortness of breath. Negative for cough.   Cardiovascular: Positive for chest pain. Negative for leg swelling.  Gastrointestinal: Negative for abdominal pain, diarrhea, nausea and vomiting.  Genitourinary: Positive for dysuria, frequency and urgency.  Musculoskeletal: Negative for myalgias.  Skin: Negative for rash.  Neurological: Negative for headaches.     Physical Exam Updated Vital Signs BP 170/93   Pulse 93   Temp 97.8 F (36.6 C)   Resp 24   SpO2 98%   Physical Exam  Constitutional: She appears well-developed and well-nourished.  HENT:  Head: Normocephalic and atraumatic.  Mouth/Throat: Oropharynx is clear and moist and mucous membranes are normal. Mucous membranes are not dry.  Eyes: Conjunctivae are normal. Right eye exhibits no discharge. Left eye exhibits no discharge.  Neck: Trachea normal and normal range of motion. Neck supple. Normal carotid pulses and no JVD present. No muscular tenderness present. Carotid bruit is not present. No tracheal deviation present.  Cardiovascular: Normal rate, regular rhythm, S1 normal, S2 normal, normal heart sounds and intact distal pulses.  Exam reveals no decreased pulses.   No murmur heard. Pulmonary/Chest: Effort normal. Tachypnea noted. No respiratory distress. She has no wheezes. She has no rhonchi. She has no rales. She exhibits no tenderness.  Abdominal: Soft. Normal aorta and bowel sounds are normal. There is no tenderness. There is no rebound and no guarding.  Musculoskeletal: Normal range of motion.  Neurological: She is alert.  Skin: Skin is warm and dry. She is not diaphoretic. No cyanosis. No pallor.  Psychiatric: She has a normal mood and affect.  Nursing note and vitals  reviewed.    ED Treatments / Results  Labs (all labs ordered are listed, but only abnormal results are displayed) Labs Reviewed  BASIC METABOLIC PANEL - Abnormal; Notable for the following:       Result Value   Sodium 123 (*)    Chloride 90 (*)    Glucose, Bld 130 (*)    Creatinine, Ser 1.04 (*)    Calcium 8.6 (*)    GFR calc non Af Amer 48 (*)    GFR calc Af Amer 56 (*)    All other components within normal limits  CBC - Abnormal; Notable for the following:    RBC 3.71 (*)    HCT 33.4 (*)    All other components within normal limits  URINALYSIS, ROUTINE W REFLEX MICROSCOPIC - Abnormal; Notable for the following:    Specific Gravity, Urine 1.004 (*)    Nitrite POSITIVE (*)    Leukocytes, UA LARGE (*)    Bacteria, UA FEW (*)  All other components within normal limits  URINE CULTURE  D-DIMER, QUANTITATIVE (NOT AT [facility])  TROPONIN I  TROPONIN I  TROPONIN I  Randolm Idol, ED  I-STAT TROPOININ, ED    EKG  EKG Interpretation  Date/Time:  Sunday June 17 2016 16:52:16 EST Ventricular Rate:  100 PR Interval:    QRS Duration: 97 QT Interval:  346 QTC Calculation: 442 R Axis:   13 Text Interpretation:  Poor data quality, interpretation may be adversely affected Sinus rhythm Confirmed by Wilson Singer  MD, STEPHEN [phone]) on 06/17/2016 5:16:12 PM       Radiology Dg Chest 2 View  Result Date: 06/17/2016 CLINICAL DATA:  Left-sided chest pain shortness of Breath EXAM: CHEST  2 VIEW COMPARISON:  None. FINDINGS: Cardiac shadow is within normal limits. A large hiatal hernia is noted. The lungs are well aerated without focal infiltrate. Mid thoracic compression deformity is noted of uncertain age. Degenerative changes of the shoulder joints are seen. IMPRESSION: Chronic appearing changes as described. Electronically Signed   By: Inez Catalina M.D.   On: 06/17/2016 17:33    Procedures Procedures (including critical care time)  Medications Ordered in ED Medications   cefTRIAXone (ROCEPHIN) 1 g in dextrose 5 % 50 mL IVPB (not administered)  acetaminophen (TYLENOL) tablet 650 mg (not administered)  ondansetron (ZOFRAN) injection 4 mg (not administered)  enoxaparin (LOVENOX) injection 40 mg (not administered)  cefTRIAXone (ROCEPHIN) 1 g in dextrose 5 % 50 mL IVPB (not administered)  aspirin chewable tablet 324 mg (324 mg Oral Given 06/17/16 1733)  morphine 4 MG/ML injection 4 mg (4 mg Intravenous Given 06/17/16 1958)  ondansetron (ZOFRAN) injection 4 mg (4 mg Intravenous Given 06/17/16 1958)     Initial Impression / Assessment and Plan / ED Course  I have reviewed the triage vital signs and the nursing notes.  Pertinent labs & imaging results that were available during my care of the patient were reviewed by me and considered in my medical decision making (see chart for details).     Patient seen and examined. Work-up initiated. Medications ordered.   Vital signs reviewed and are as follows: BP 170/93   Pulse 93   Temp 97.8 F (36.6 C)   Resp 24   SpO2 98%   Patient has remained short of breath during ED evaluation. Chest pain improved with morphine.  Patient required 2 person assistance to walk and became very short of breath. Oxygen saturation dropped to 91%.  Will admit forchest pain rule out, shortness of breath, UTI. Patient and son at bedside agree with the plan.  Final Clinical Impressions(s) / ED Diagnoses   Final diagnoses:  Shortness of breath  Chest pain, unspecified type  Acute cystitis without hematuria   Admit.   New Prescriptions New Prescriptions   No medications on file     Carlisle Cater, Hershal Coria 06/17/16 2246    Virgel Manifold, MD 06/30/16 1041

## 2016-06-17 NOTE — ED Notes (Signed)
Pt is in pain can not walk at this time.

## 2016-06-17 NOTE — ED Triage Notes (Signed)
Pt arrives via POV with her daughter in law. She has not been feeling well for several days, has had burning with urination. Today has had left sided chest pain and shortness of breath. She also reports increasing belching and flatulence.

## 2016-06-18 DIAGNOSIS — E869 Volume depletion, unspecified: Secondary | ICD-10-CM | POA: Diagnosis not present

## 2016-06-18 DIAGNOSIS — K219 Gastro-esophageal reflux disease without esophagitis: Secondary | ICD-10-CM | POA: Diagnosis not present

## 2016-06-18 DIAGNOSIS — I10 Essential (primary) hypertension: Secondary | ICD-10-CM | POA: Diagnosis not present

## 2016-06-18 DIAGNOSIS — F419 Anxiety disorder, unspecified: Secondary | ICD-10-CM | POA: Diagnosis not present

## 2016-06-18 DIAGNOSIS — I5032 Chronic diastolic (congestive) heart failure: Secondary | ICD-10-CM | POA: Diagnosis not present

## 2016-06-18 DIAGNOSIS — R079 Chest pain, unspecified: Secondary | ICD-10-CM | POA: Diagnosis not present

## 2016-06-18 DIAGNOSIS — E871 Hypo-osmolality and hyponatremia: Secondary | ICD-10-CM | POA: Diagnosis not present

## 2016-06-18 DIAGNOSIS — I110 Hypertensive heart disease with heart failure: Secondary | ICD-10-CM | POA: Diagnosis not present

## 2016-06-18 DIAGNOSIS — R630 Anorexia: Secondary | ICD-10-CM | POA: Diagnosis not present

## 2016-06-18 DIAGNOSIS — R0789 Other chest pain: Secondary | ICD-10-CM | POA: Diagnosis not present

## 2016-06-18 DIAGNOSIS — E039 Hypothyroidism, unspecified: Secondary | ICD-10-CM | POA: Diagnosis not present

## 2016-06-18 DIAGNOSIS — N3000 Acute cystitis without hematuria: Secondary | ICD-10-CM | POA: Diagnosis not present

## 2016-06-18 DIAGNOSIS — I959 Hypotension, unspecified: Secondary | ICD-10-CM | POA: Diagnosis not present

## 2016-06-18 DIAGNOSIS — E860 Dehydration: Secondary | ICD-10-CM | POA: Diagnosis not present

## 2016-06-18 LAB — TROPONIN I
Troponin I: <0.03 ng/mL (ref <0.03)
Troponin I: <0.03 ng/mL (ref <0.03)

## 2016-06-18 LAB — BASIC METABOLIC PANEL
Anion gap: 7 (ref 5–15)
BUN: 10 mg/dL (ref 6–20)
CHLORIDE: 92 mmol/L — AB (ref 101–111)
CO2: 27 mmol/L (ref 22–32)
Calcium: 8.4 mg/dL — ABNORMAL LOW (ref 8.9–10.3)
Creatinine, Ser: 0.94 mg/dL (ref 0.44–1.00)
GFR calc Af Amer: >60 mL/min (ref >60)
GFR calc non Af Amer: 55 mL/min — ABNORMAL LOW (ref >60)
GLUCOSE: 103 mg/dL — AB (ref 65–99)
POTASSIUM: 3.7 mmol/L (ref 3.5–5.1)
Sodium: 126 mmol/L — ABNORMAL LOW (ref 135–145)

## 2016-06-18 MED ORDER — CEFPODOXIME PROXETIL 200 MG PO TABS
200.0000 mg | ORAL_TABLET | Freq: Two times a day (BID) | ORAL | Status: DC
  Administered 2016-06-18 – 2016-06-20 (×5): 200 mg via ORAL
  Filled 2016-06-18 (×5): qty 1

## 2016-06-18 NOTE — Progress Notes (Signed)
PROGRESS NOTE    Ellen Chandler  DOB: 06-02-1930  DOA: 06/17/2016 PCP: Thressa Sheller, MD   Brief Narrative:   Ellen Chandler is a 83 y.o. female with medical history significant of diastolic CHF, no prior known CAD.  Patient presents to the ED with acute onset of generalized weakness and SOB.  Associated mild lower chest pain at onset that has now resolved.  Symptoms occurred while sitting and watching olympics.  No syncope.  No fevers nor chills nor cough.  No calf pain, no recent travel.   Subjective: No further chest pain. Still feels weak. No longer having burning micturition.   Assessment & Plan:   Principal Problem:   Chest pain  it occurred on the way to the hospital for her other complaints and has resolved.  - likely muscular or due to anxiety- her chest wall is tender - EKG's unrevealing and Troponin negative  Active Problems:   Hyponatremia - chronic issue exacerbated by HCTZ and Lasix - cont slow IVF    Essential hypertension - cont  Norvasc - hold Losartan as BP on low side - hold HCTZ and Lasix- last ECHO in 2014 shows normal EF and grade 1 dCHF- would use lasix in her only PRN    UTI (urinary tract infection) - on Rocephin- change to Vantin - f/u culture  DVT prophylaxis: Lovenox Code Status: Full code Family Communication: son Disposition Plan: home with [place] on d/c Consultants:    Procedures:    Antimicrobials:  Anti-infectives    Start     Dose/Rate Route Frequency Ordered Stop   06/18/16 2200  cefTRIAXone (ROCEPHIN) 1 g in dextrose 5 % 50 mL IVPB     1 g 100 mL/hr over 30 Minutes Intravenous Every 24 hours 06/17/16 2244     06/17/16 2245  cefTRIAXone (ROCEPHIN) 1 g in dextrose 5 % 50 mL IVPB     1 g 100 mL/hr over 30 Minutes Intravenous  Once 06/17/16 2239 06/17/16 2334      Objective: Vitals:   06/18/16 0030 06/18/16 0100 06/18/16 0551 06/18/16 1028  BP: 108/58 104/84 (!) 99/56 129/71  Pulse: 76 72 67 70    Resp: 19 20 20    Temp:  98.3 F (36.8 C) 97.7 F (36.5 C)   TempSrc:  Oral Oral   SpO2: 98% 100% 95%   Weight:  77.6 kg (171 lb)    Height:  5\' 5"  (1.651 m)      Intake/Output Summary (Last 24 hours) at 06/18/16 1234 Last data filed at 06/18/16 1211  Gross per 24 hour  Intake          1123.75 ml  Output             2400 ml  Net         -1276.25 ml   Filed Weights   06/18/16 0100  Weight: 77.6 kg (171 lb)    Examination: General exam: Appears comfortable  HEENT: PERRLA, oral mucosa moist, no sclera icterus or thrush Respiratory system: Clear to auscultation. Respiratory effort normal. Cardiovascular system: S1 & S2 heard, RRR.  No murmurs  Gastrointestinal system: Abdomen soft, non-tender, nondistended. Normal bowel sound. No organomegaly Central nervous system: Alert and oriented. No focal neurological deficits. Extremities: No cyanosis, clubbing or edema Skin: No rashes or ulcers Psychiatry:  Mood & affect appropriate.     Data Reviewed: I have personally reviewed following labs and imaging studies  CBC:  Recent Labs Lab 06/17/16 1740  WBC 8.0  HGB 12.0  HCT 33.4*  MCV 90.0  PLT 123456   Basic Metabolic Panel:  Recent Labs Lab 06/17/16 1740 06/18/16 0444  NA 123* 126*  K 4.0 3.7  CL 90* 92*  CO2 22 27  GLUCOSE 130* 103*  BUN 11 10  CREATININE 1.04* 0.94  CALCIUM 8.6* 8.4*   GFR: Estimated Creatinine Clearance: 46.7 mL/min (by C-G formula based on SCr of 0.94 mg/dL). Liver Function Tests: No results for input(s): AST, ALT, ALKPHOS, BILITOT, PROT, ALBUMIN in the last 168 hours. No results for input(s): LIPASE, AMYLASE in the last 168 hours. No results for input(s): AMMONIA in the last 168 hours. Coagulation Profile: No results for input(s): INR, PROTIME in the last 168 hours. Cardiac Enzymes:  Recent Labs Lab 06/18/16 0131 06/18/16 0444 06/18/16 0925  TROPONINI <0.03 <0.03 <0.03   BNP (last 3 results) No results for input(s): PROBNP in  the last 8760 hours. HbA1C: No results for input(s): HGBA1C in the last 72 hours. CBG: No results for input(s): GLUCAP in the last 168 hours. Lipid Profile: No results for input(s): CHOL, HDL, LDLCALC, TRIG, CHOLHDL, LDLDIRECT in the last 72 hours. Thyroid Function Tests: No results for input(s): TSH, T4TOTAL, FREET4, T3FREE, THYROIDAB in the last 72 hours. Anemia Panel: No results for input(s): VITAMINB12, FOLATE, FERRITIN, TIBC, IRON, RETICCTPCT in the last 72 hours. Urine analysis:    Component Value Date/Time   COLORURINE YELLOW 06/17/2016 1928   APPEARANCEUR CLEAR 06/17/2016 1928   LABSPEC 1.004 (L) 06/17/2016 1928   PHURINE 6.0 06/17/2016 1928   GLUCOSEU NEGATIVE 06/17/2016 1928   HGBUR NEGATIVE 06/17/2016 1928   BILIRUBINUR NEGATIVE 06/17/2016 1928   KETONESUR NEGATIVE 06/17/2016 1928   PROTEINUR NEGATIVE 06/17/2016 1928   UROBILINOGEN 0.2 05/19/2014 0635   NITRITE POSITIVE (A) 06/17/2016 1928   LEUKOCYTESUR LARGE (A) 06/17/2016 1928   Sepsis Labs: @LABRCNTIP (procalcitonin:4,lacticidven:4) )No results found for this or any previous visit (from the past 240 hour(s)).       Radiology Studies: Dg Chest 2 View  Result Date: 06/17/2016 CLINICAL DATA:  Left-sided chest pain shortness of Breath EXAM: CHEST  2 VIEW COMPARISON:  None. FINDINGS: Cardiac shadow is within normal limits. A large hiatal hernia is noted. The lungs are well aerated without focal infiltrate. Mid thoracic compression deformity is noted of uncertain age. Degenerative changes of the shoulder joints are seen. IMPRESSION: Chronic appearing changes as described. Electronically Signed   By: Inez Catalina M.D.   On: 06/17/2016 17:33      Scheduled Meds: . amLODipine  5 mg Oral Daily  . aspirin  81 mg Oral Daily  . cefTRIAXone (ROCEPHIN)  IV  1 g Intravenous Q24H  . enoxaparin (LOVENOX) injection  40 mg Subcutaneous QHS  . ezetimibe  10 mg Oral Daily  . levothyroxine  88 mcg Oral QAC breakfast  .  multivitamin with minerals  1 tablet Oral Daily  . oxybutynin  5 mg Oral BID  . pantoprazole  40 mg Oral Daily   Continuous Infusions: . sodium chloride 75 mL/hr (06/18/16 0117)     LOS: 0 days    Time spent in minutes: 72    [place], MD [facility] Pager: [email or web address] Password [facility] 06/18/2016, 12:34 PM

## 2016-06-19 DIAGNOSIS — F419 Anxiety disorder, unspecified: Secondary | ICD-10-CM | POA: Diagnosis not present

## 2016-06-19 DIAGNOSIS — E869 Volume depletion, unspecified: Secondary | ICD-10-CM | POA: Diagnosis not present

## 2016-06-19 DIAGNOSIS — Z8249 Family history of ischemic heart disease and other diseases of the circulatory system: Secondary | ICD-10-CM | POA: Diagnosis not present

## 2016-06-19 DIAGNOSIS — Z7982 Long term (current) use of aspirin: Secondary | ICD-10-CM | POA: Diagnosis not present

## 2016-06-19 DIAGNOSIS — I5032 Chronic diastolic (congestive) heart failure: Secondary | ICD-10-CM | POA: Diagnosis not present

## 2016-06-19 DIAGNOSIS — E860 Dehydration: Secondary | ICD-10-CM | POA: Diagnosis not present

## 2016-06-19 DIAGNOSIS — R079 Chest pain, unspecified: Secondary | ICD-10-CM | POA: Diagnosis not present

## 2016-06-19 DIAGNOSIS — E039 Hypothyroidism, unspecified: Secondary | ICD-10-CM | POA: Diagnosis not present

## 2016-06-19 DIAGNOSIS — R0789 Other chest pain: Secondary | ICD-10-CM | POA: Diagnosis not present

## 2016-06-19 DIAGNOSIS — E871 Hypo-osmolality and hyponatremia: Secondary | ICD-10-CM | POA: Diagnosis not present

## 2016-06-19 DIAGNOSIS — N3000 Acute cystitis without hematuria: Secondary | ICD-10-CM | POA: Diagnosis not present

## 2016-06-19 DIAGNOSIS — I10 Essential (primary) hypertension: Secondary | ICD-10-CM | POA: Diagnosis not present

## 2016-06-19 DIAGNOSIS — Z96652 Presence of left artificial knee joint: Secondary | ICD-10-CM | POA: Diagnosis not present

## 2016-06-19 DIAGNOSIS — I110 Hypertensive heart disease with heart failure: Secondary | ICD-10-CM | POA: Diagnosis not present

## 2016-06-19 DIAGNOSIS — Z79899 Other long term (current) drug therapy: Secondary | ICD-10-CM | POA: Diagnosis not present

## 2016-06-19 DIAGNOSIS — R630 Anorexia: Secondary | ICD-10-CM | POA: Diagnosis not present

## 2016-06-19 DIAGNOSIS — K219 Gastro-esophageal reflux disease without esophagitis: Secondary | ICD-10-CM | POA: Diagnosis not present

## 2016-06-19 DIAGNOSIS — I959 Hypotension, unspecified: Secondary | ICD-10-CM | POA: Diagnosis not present

## 2016-06-19 LAB — CBC
HCT: 28.3 % — ABNORMAL LOW (ref 36.0–46.0)
Hemoglobin: 9.7 g/dL — ABNORMAL LOW (ref 12.0–15.0)
MCH: 30.9 pg (ref 26.0–34.0)
MCHC: 34.3 g/dL (ref 30.0–36.0)
MCV: 90.1 fL (ref 78.0–100.0)
PLATELETS: 312 10*3/uL (ref 150–400)
RBC: 3.14 MIL/uL — AB (ref 3.87–5.11)
RDW: 12.1 % (ref 11.5–15.5)
WBC: 6.5 10*3/uL (ref 4.0–10.5)

## 2016-06-19 LAB — BASIC METABOLIC PANEL
Anion gap: 7 (ref 5–15)
BUN: 11 mg/dL (ref 6–20)
CHLORIDE: 100 mmol/L — AB (ref 101–111)
CO2: 25 mmol/L (ref 22–32)
CREATININE: 0.78 mg/dL (ref 0.44–1.00)
Calcium: 8.2 mg/dL — ABNORMAL LOW (ref 8.9–10.3)
GFR calc non Af Amer: >60 mL/min (ref >60)
GLUCOSE: 94 mg/dL (ref 65–99)
Potassium: 3.7 mmol/L (ref 3.5–5.1)
Sodium: 132 mmol/L — ABNORMAL LOW (ref 135–145)

## 2016-06-19 MED ORDER — DICLOFENAC SODIUM 1 % TD GEL
2.0000 g | Freq: Four times a day (QID) | TRANSDERMAL | Status: DC
  Administered 2016-06-19 – 2016-06-20 (×4): 2 g via TOPICAL
  Filled 2016-06-19: qty 100

## 2016-06-19 NOTE — Progress Notes (Signed)
Ellen Chandler referral given to in house rep with [facility].

## 2016-06-19 NOTE — Progress Notes (Signed)
Assumed care of patient. Report received from [facility]. Agree with previous assessment. Patient currently asleep. Will continue to monitor.

## 2016-06-19 NOTE — Progress Notes (Signed)
PROGRESS NOTE    [name]   [id number]  DOB: Sep 12, 1930  DOA: 06/17/2016 PCP: Thressa Sheller, MD   Brief Narrative:   Ellen Chandler is a 83 y.o. female with medical history significant of diastolic CHF, no prior known CAD.  Patient presents to the ED with acute onset of generalized weakness and SOB.  Associated mild lower chest pain at onset that has now resolved.  Symptoms occurred while sitting and watching olympics.  No syncope.  No fevers nor chills nor cough.  No calf pain, no recent travel.   Subjective: Had pain in left upper abdomen when she sat up today. Weakness improving. Eating very well in the hospital because she loves the food. Ambulating to bathroom.    Assessment & Plan:   Principal Problem:   Hyponatremia with complaints of generalized weakness and loss of appetite - chronic issue exacerbated by HCTZ and Lasix - cont slow IVF- hopefully will be > 135 tomorrow- would not resume daily diuretics on this elderly, frail lady without significant CHF     UTI (urinary tract infection) -  Rocephin on admission- changed to Vantin yesterday - culture growing K pneumoniae- dysuria resolved by day 2    Chest pain  - it occurred on the way to the hospital and again when sitting up in bed today - likely muscular or due to anxiety- her chest wall is tender- she is worried that she is having it again today in RUQ and a nurse told her that it was likely indigestion - explained to her again today that I told her yesterday that her chest pain was muscular because she was tender on exam- Voltaren gel - EKG's unrevealing and Troponin negative    hypotensive with h/o Essential hypertension - hypotensive maybe due to volume depletion - hold Norvasc, Losartan,  HCTZ and Lasix- last ECHO in 2014 shows normal EF and grade 1 dCHF- would use lasix in her only PRN if she has severe pedal edema   DVT prophylaxis: Lovenox Code Status: Full code Family Communication:  son Disposition Plan: home with [place] on d/c- family plans to take care of her- home tomorrow Consultants:    Procedures:    Antimicrobials:  Anti-infectives    Start     Dose/Rate Route Frequency Ordered Stop   06/18/16 2200  cefTRIAXone (ROCEPHIN) 1 g in dextrose 5 % 50 mL IVPB  Status:  Discontinued     1 g 100 mL/hr over 30 Minutes Intravenous Every 24 hours 06/17/16 2244 06/18/16 1244   06/18/16 1400  cefpodoxime (VANTIN) tablet 200 mg     200 mg Oral Every 12 hours 06/18/16 1244     06/17/16 2245  cefTRIAXone (ROCEPHIN) 1 g in dextrose 5 % 50 mL IVPB     1 g 100 mL/hr over 30 Minutes Intravenous  Once 06/17/16 2239 06/17/16 2334      Objective: Vitals:   06/18/16 1028 06/18/16 1351 06/18/16 2126 06/19/16 0458  BP: 129/71 (!) 102/50 (!) 97/44 (!) 100/50  Pulse: 70 72 76 65  Resp:  18 18 18   Temp:  98.6 F (37 C) 98.5 F (36.9 C) 98.2 F (36.8 C)  TempSrc:  Oral Oral Oral  SpO2:  95% 93% 98%  Weight:      Height:        Intake/Output Summary (Last 24 hours) at 06/19/16 1227 Last data filed at 06/19/16 1221  Gross per 24 hour  Intake  2880 ml  Output             2500 ml  Net              380 ml   Filed Weights   06/18/16 0100  Weight: 77.6 kg (171 lb)    Examination: General exam: Appears comfortable  HEENT: PERRLA, oral mucosa moist, no sclera icterus or thrush Respiratory system: Clear to auscultation. Respiratory effort normal. Cardiovascular system: S1 & S2 heard, RRR.  No murmurs  Gastrointestinal system: Abdomen soft, non-tender, nondistended. Normal bowel sound. No organomegaly Central nervous system: Alert and oriented. No focal neurological deficits. Extremities: No cyanosis, clubbing or edema Skin: No rashes or ulcers Psychiatry:  Mood & affect appropriate.     Data Reviewed: I have personally reviewed following labs and imaging studies  CBC:  Recent Labs Lab 06/17/16 1740 06/19/16 0510  WBC 8.0 6.5  HGB 12.0 9.7*  HCT  33.4* 28.3*  MCV 90.0 90.1  PLT 336 [id number]   Basic Metabolic Panel:  Recent Labs Lab 06/17/16 1740 06/18/16 0444 06/19/16 0510  NA 123* 126* 132*  K 4.0 3.7 3.7  CL 90* 92* 100*  CO2 22 27 25   GLUCOSE 130* 103* 94  BUN 11 10 11   CREATININE 1.04* 0.94 0.78  CALCIUM 8.6* 8.4* 8.2*   GFR: Estimated Creatinine Clearance: 54.8 mL/min (by C-G formula based on SCr of 0.78 mg/dL). Liver Function Tests: No results for input(s): AST, ALT, ALKPHOS, BILITOT, PROT, ALBUMIN in the last 168 hours. No results for input(s): LIPASE, AMYLASE in the last 168 hours. No results for input(s): AMMONIA in the last 168 hours. Coagulation Profile: No results for input(s): INR, PROTIME in the last 168 hours. Cardiac Enzymes:  Recent Labs Lab 06/18/16 0131 06/18/16 0444 06/18/16 0925  TROPONINI <0.03 <0.03 <0.03   BNP (last 3 results) No results for input(s): PROBNP in the last 8760 hours. HbA1C: No results for input(s): HGBA1C in the last 72 hours. CBG: No results for input(s): GLUCAP in the last 168 hours. Lipid Profile: No results for input(s): CHOL, HDL, LDLCALC, TRIG, CHOLHDL, LDLDIRECT in the last 72 hours. Thyroid Function Tests: No results for input(s): TSH, T4TOTAL, FREET4, T3FREE, THYROIDAB in the last 72 hours. Anemia Panel: No results for input(s): VITAMINB12, FOLATE, FERRITIN, TIBC, IRON, RETICCTPCT in the last 72 hours. Urine analysis:    Component Value Date/Time   COLORURINE YELLOW 06/17/2016 1928   APPEARANCEUR CLEAR 06/17/2016 1928   LABSPEC 1.004 (L) 06/17/2016 1928   PHURINE 6.0 06/17/2016 1928   GLUCOSEU NEGATIVE 06/17/2016 1928   HGBUR NEGATIVE 06/17/2016 1928   BILIRUBINUR NEGATIVE 06/17/2016 1928   KETONESUR NEGATIVE 06/17/2016 1928   PROTEINUR NEGATIVE 06/17/2016 1928   UROBILINOGEN 0.2 05/19/2014 0635   NITRITE POSITIVE (A) 06/17/2016 1928   LEUKOCYTESUR LARGE (A) 06/17/2016 1928   Sepsis Labs: @LABRCNTIP (procalcitonin:4,lacticidven:4) ) Recent Results  (from the past 240 hour(s))  Culture, Urine     Status: Abnormal (Preliminary result)   Collection Time: 06/17/16  7:28 PM  Result Value Ref Range Status   Specimen Description URINE, RANDOM  Final   Special Requests NONE  Final   Culture (A)  Final    >=100,000 COLONIES/mL KLEBSIELLA PNEUMONIAE SUSCEPTIBILITIES TO FOLLOW Performed at [facility], [place] [address] [place], [place] [address]    Report Status PENDING  Incomplete    Radiology Studies: Dg Chest 2 View  Result Date: 06/17/2016 CLINICAL DATA:  Left-sided chest pain shortness of Breath EXAM: CHEST  2 VIEW COMPARISON:  None. FINDINGS: Cardiac shadow is within normal limits. A large hiatal hernia is noted. The lungs are well aerated without focal infiltrate. Mid thoracic compression deformity is noted of uncertain age. Degenerative changes of the shoulder joints are seen. IMPRESSION: Chronic appearing changes as described. Electronically Signed   By: Inez Catalina M.D.   On: 06/17/2016 17:33      Scheduled Meds: . amLODipine  5 mg Oral Daily  . aspirin  81 mg Oral Daily  . cefpodoxime  200 mg Oral Q12H  . diclofenac sodium  2 g Topical QID  . enoxaparin (LOVENOX) injection  40 mg Subcutaneous QHS  . ezetimibe  10 mg Oral Daily  . levothyroxine  88 mcg Oral QAC breakfast  . multivitamin with minerals  1 tablet Oral Daily  . oxybutynin  5 mg Oral BID  . pantoprazole  40 mg Oral Daily   Continuous Infusions: . sodium chloride 50 mL/hr at 06/19/16 0913     LOS: 0 days    Time spent in minutes: 64    R[place]o [place], MD [facility] Pager: [email or web address] Password [facility] 06/19/2016, 12:27 PM

## 2016-06-19 NOTE — Care Management Note (Signed)
Case Management Note  Patient Details  Name: COLLETTA WOLIN MRN: [id number] Date of Birth: [birth year]  Subjective/Objective:  Chest Pain, UTI                  Action/Plan:Plan to D/C home with [facility] and family   Expected Discharge Date:  06/20/2016               Expected Discharge Plan:  [place]  In-House Referral:  NA  Discharge planning Services  CM Consult  Post Acute Care Choice:  Home Health Choice offered to:  Patient  DME Arranged:    DME Agency:     HH Arranged:  PT [place]:  [place]  Status of Service:  In process, will continue to follow  If discussed at Long Length of Stay Meetings, dates discussed:    Additional CommentsPurcell Mouton, RN 06/19/2016, 12:19 PM

## 2016-06-20 DIAGNOSIS — I10 Essential (primary) hypertension: Secondary | ICD-10-CM

## 2016-06-20 DIAGNOSIS — N3000 Acute cystitis without hematuria: Secondary | ICD-10-CM

## 2016-06-20 DIAGNOSIS — E871 Hypo-osmolality and hyponatremia: Secondary | ICD-10-CM

## 2016-06-20 LAB — BASIC METABOLIC PANEL
Anion gap: 8 (ref 5–15)
BUN: 12 mg/dL (ref 6–20)
CHLORIDE: 99 mmol/L — AB (ref 101–111)
CO2: 23 mmol/L (ref 22–32)
CREATININE: 0.73 mg/dL (ref 0.44–1.00)
Calcium: 8.8 mg/dL — ABNORMAL LOW (ref 8.9–10.3)
GFR calc non Af Amer: >60 mL/min (ref >60)
Glucose, Bld: 108 mg/dL — ABNORMAL HIGH (ref 65–99)
POTASSIUM: 4.1 mmol/L (ref 3.5–5.1)
SODIUM: 130 mmol/L — AB (ref 135–145)

## 2016-06-20 LAB — URINE CULTURE: Culture: >=100000 — AB

## 2016-06-20 MED ORDER — DICLOFENAC SODIUM 1 % TD GEL: 2.0000 g | Freq: Four times a day (QID) | TRANSDERMAL | 0 refills | Status: DC

## 2016-06-20 MED ORDER — CEFPODOXIME PROXETIL 200 MG PO TABS: 200.0000 mg | ORAL_TABLET | Freq: Two times a day (BID) | ORAL | 0 refills | Status: DC

## 2016-06-20 NOTE — Discharge Summary (Signed)
Physician Discharge Summary  Ellen Chandler [id number] DOB: [birth year] DOA: 06/17/2016  PCP: Thressa Sheller, MD  Admit date: 06/17/2016 Discharge date: 06/20/2016  Admitted From: Home Disposition:  Home  Recommendations for Outpatient Follow-up:  1. Follow up with PCP in 1-2 weeks 2. Please obtain BMP in 1-2 weeks, focus on sodium  Home Health:PT   Discharge Condition:Improved CODE STATUS:Full Diet recommendation: Heart healthy   Brief/Interim Summary: 83 y.o.femalewith medical history significant of diastolic CHF, no prior known CAD. Patient presents to the ED with acute onset of generalized weakness and SOB. Associated mild lower chest pain at onset that has now resolved. Symptoms occurred while sitting and watching olympics. No syncope. No fevers nor chills nor cough. No calf pain, no recent travel.    Hyponatremia with complaints of generalized weakness and loss of appetite - chronic issue exacerbated by HCTZ and Lasix - Patient was given slow IVF sodium remained stable at 130- would not resume daily diuretics on this elderly, frail lady without significant CHF     UTI (urinary tract infection) -  Rocephin on admission- changed to [facility] 2/12 - culture growing K pneumoniae- dysuria resolved by day 2 - Will treat two more days of PO antibiotics    Chest pain - likely muscular or due to anxiety- her chest wall is tender - continued on Voltaren gel - EKG's unrevealing and Troponin negative    hypotensive with h/o Essential hypertension - hypotensive maybe due to volume depletion - hold Norvasc, Losartan,  HCTZ and Lasix- last ECHO in 2014 shows normal EF and grade 1 dCHF- would use lasix in her only PRN if she has severe pedal edema  Discharge Diagnoses:  Principal Problem:   Chest pain Active Problems:   Hyponatremia   Essential hypertension   UTI (urinary tract infection)    Discharge Instructions   Allergies as of 06/20/2016   No Known  Allergies     Medication List    STOP taking these medications   furosemide 20 MG tablet Commonly known as:  LASIX   losartan-hydrochlorothiazide 50-12.5 MG tablet Commonly known as:  HYZAAR     TAKE these medications   amLODipine 5 MG tablet Commonly known as:  NORVASC Take 5 mg by mouth daily.   aspirin 81 MG tablet Take 81 mg by mouth daily.   cefpodoxime 200 MG tablet Commonly known as:  VANTIN Take 1 tablet (200 mg total) by mouth every 12 (twelve) hours.   diclofenac sodium 1 % Gel Commonly known as:  VOLTAREN Apply 2 g topically 4 (four) times daily.   ezetimibe 10 MG tablet Commonly known as:  ZETIA Take 10 mg by mouth daily.   levothyroxine 88 MCG tablet Commonly known as:  SYNTHROID, LEVOTHROID Take 88 mcg by mouth daily before breakfast.   multivitamin with minerals Tabs tablet Take 1 tablet by mouth daily.   nabumetone 750 MG tablet Commonly known as:  RELAFEN Take 750 mg by mouth 2 (two) times daily as needed for mild pain.   omeprazole 20 MG capsule Commonly known as:  PRILOSEC Take 20 mg by mouth 2 (two) times daily before a meal.   oxybutynin 5 MG 24 hr tablet Commonly known as:  DITROPAN-XL Take 5 mg by mouth 2 (two) times daily.   REFRESH TEARS OP Place 1 drop into both eyes every morning.      Follow-up Information    MACKENZIE,BRIAN, MD. Schedule an appointment as soon as possible for a visit in 2 week(s).  Specialty:  Internal Medicine Contact information: [place], [place] [address] [phone]          No Known Allergies  Procedures/Studies: Dg Chest 2 View  Result Date: 06/17/2016 CLINICAL DATA:  Left-sided chest pain shortness of Breath EXAM: CHEST  2 VIEW COMPARISON:  None. FINDINGS: Cardiac shadow is within normal limits. A large hiatal hernia is noted. The lungs are well aerated without focal infiltrate. Mid thoracic compression deformity is noted of uncertain age. Degenerative changes of  the shoulder joints are seen. IMPRESSION: Chronic appearing changes as described. Electronically Signed   By: Inez Catalina M.D.   On: 06/17/2016 17:33    Subjective: Eager to go home  Discharge Exam: Vitals:   06/19/16 2144 06/20/16 0536  BP: (!) 116/59 117/60  Pulse: 66 70  Resp: 18 16  Temp: 98.2 F (36.8 C) 98.1 F (36.7 C)   Vitals:   06/19/16 0458 06/19/16 1500 06/19/16 2144 06/20/16 0536  BP: (!) 100/50 (!) 148/85 (!) 116/59 117/60  Pulse: 65 75 66 70  Resp: 18 18 18 16   Temp: 98.2 F (36.8 C) 98.2 F (36.8 C) 98.2 F (36.8 C) 98.1 F (36.7 C)  TempSrc: Oral Oral Oral Oral  SpO2: 98% 98% 94% 95%  Weight:      Height:        General: Pt is alert, awake, not in acute distress Cardiovascular: RRR, S1/S2 +, no rubs, no gallops Respiratory: CTA bilaterally, no wheezing, no rhonchi Abdominal: Soft, NT, ND, bowel sounds + Extremities: no edema, no cyanosis   The results of significant diagnostics from this hospitalization (including imaging, microbiology, ancillary and laboratory) are listed below for reference.     Microbiology: Recent Results (from the past 240 hour(s))  Culture, Urine     Status: Abnormal   Collection Time: 06/17/16  7:28 PM  Result Value Ref Range Status   Specimen Description URINE, RANDOM  Final   Special Requests NONE  Final   Culture >=100,000 COLONIES/mL KLEBSIELLA PNEUMONIAE (A)  Final   Report Status 06/20/2016 FINAL  Final   Organism ID, Bacteria KLEBSIELLA PNEUMONIAE (A)  Final      Susceptibility   Klebsiella pneumoniae - MIC*    AMPICILLIN >=32 RESISTANT Resistant     CEFAZOLIN <=4 SENSITIVE Sensitive     CEFTRIAXONE <=1 SENSITIVE Sensitive     CIPROFLOXACIN <=0.25 SENSITIVE Sensitive     GENTAMICIN <=1 SENSITIVE Sensitive     IMIPENEM <=0.25 SENSITIVE Sensitive     NITROFURANTOIN <=16 SENSITIVE Sensitive     TRIMETH/SULFA <=20 SENSITIVE Sensitive     AMPICILLIN/SULBACTAM 16 INTERMEDIATE Intermediate     PIP/TAZO <=4  SENSITIVE Sensitive     Extended ESBL NEGATIVE Sensitive     * >=100,000 COLONIES/mL KLEBSIELLA PNEUMONIAE     Labs: BNP (last 3 results) No results for input(s): BNP in the last 8760 hours. Basic Metabolic Panel:  Recent Labs Lab 06/17/16 1740 06/18/16 0444 06/19/16 0510 06/20/16 0817  NA 123* 126* 132* 130*  K 4.0 3.7 3.7 4.1  CL 90* 92* 100* 99*  CO2 22 27 25 23   GLUCOSE 130* 103* 94 108*  BUN 11 10 11 12   CREATININE 1.04* 0.94 0.78 0.73  CALCIUM 8.6* 8.4* 8.2* 8.8*   Liver Function Tests: No results for input(s): AST, ALT, ALKPHOS, BILITOT, PROT, ALBUMIN in the last 168 hours. No results for input(s): LIPASE, AMYLASE in the last 168 hours. No results for input(s): AMMONIA in the last 168 hours.  CBC:  Recent Labs Lab 06/17/16 1740 06/19/16 0510  WBC 8.0 6.5  HGB 12.0 9.7*  HCT 33.4* 28.3*  MCV 90.0 90.1  PLT 336 312   Cardiac Enzymes:  Recent Labs Lab 06/18/16 0131 06/18/16 0444 06/18/16 0925  TROPONINI <0.03 <0.03 <0.03   BNP: Invalid input(s): POCBNP CBG: No results for input(s): GLUCAP in the last 168 hours. D-Dimer  Recent Labs  06/17/16 1740  DDIMER <0.27   Hgb A1c No results for input(s): HGBA1C in the last 72 hours. Lipid Profile No results for input(s): CHOL, HDL, LDLCALC, TRIG, CHOLHDL, LDLDIRECT in the last 72 hours. Thyroid function studies No results for input(s): TSH, T4TOTAL, T3FREE, THYROIDAB in the last 72 hours.  Invalid input(s): FREET3 Anemia work up No results for input(s): VITAMINB12, FOLATE, FERRITIN, TIBC, IRON, RETICCTPCT in the last 72 hours. Urinalysis    Component Value Date/Time   COLORURINE YELLOW 06/17/2016 1928   APPEARANCEUR CLEAR 06/17/2016 1928   LABSPEC 1.004 (L) 06/17/2016 1928   PHURINE 6.0 06/17/2016 1928   GLUCOSEU NEGATIVE 06/17/2016 1928   HGBUR NEGATIVE 06/17/2016 1928   BILIRUBINUR NEGATIVE 06/17/2016 1928   KETONESUR NEGATIVE 06/17/2016 1928   PROTEINUR NEGATIVE 06/17/2016 1928    UROBILINOGEN 0.2 05/19/2014 0635   NITRITE POSITIVE (A) 06/17/2016 1928   LEUKOCYTESUR LARGE (A) 06/17/2016 1928   Sepsis Labs Invalid input(s): PROCALCITONIN,  WBC,  LACTICIDVEN Microbiology Recent Results (from the past 240 hour(s))  Culture, Urine     Status: Abnormal   Collection Time: 06/17/16  7:28 PM  Result Value Ref Range Status   Specimen Description URINE, RANDOM  Final   Special Requests NONE  Final   Culture >=100,000 COLONIES/mL KLEBSIELLA PNEUMONIAE (A)  Final   Report Status 06/20/2016 FINAL  Final   Organism ID, Bacteria KLEBSIELLA PNEUMONIAE (A)  Final      Susceptibility   Klebsiella pneumoniae - MIC*    AMPICILLIN >=32 RESISTANT Resistant     CEFAZOLIN <=4 SENSITIVE Sensitive     CEFTRIAXONE <=1 SENSITIVE Sensitive     CIPROFLOXACIN <=0.25 SENSITIVE Sensitive     GENTAMICIN <=1 SENSITIVE Sensitive     IMIPENEM <=0.25 SENSITIVE Sensitive     NITROFURANTOIN <=16 SENSITIVE Sensitive     TRIMETH/SULFA <=20 SENSITIVE Sensitive     AMPICILLIN/SULBACTAM 16 INTERMEDIATE Intermediate     PIP/TAZO <=4 SENSITIVE Sensitive     Extended ESBL NEGATIVE Sensitive     * >=100,000 COLONIES/mL KLEBSIELLA PNEUMONIAE     SIGNED:   Melo Stauber, Orpah Melter, MD  [facility] 06/20/2016, 11:44 AM  If 7PM-7AM, please contact night-coverage [email or web address] Password [email or web address]

## 2016-07-03 DIAGNOSIS — I129 Hypertensive chronic kidney disease with stage 1 through stage 4 chronic kidney disease, or unspecified chronic kidney disease: Secondary | ICD-10-CM | POA: Diagnosis not present

## 2016-07-03 DIAGNOSIS — E785 Hyperlipidemia, unspecified: Secondary | ICD-10-CM | POA: Diagnosis not present

## 2016-07-03 DIAGNOSIS — E559 Vitamin D deficiency, unspecified: Secondary | ICD-10-CM | POA: Diagnosis not present

## 2016-07-03 DIAGNOSIS — Z0000 Encounter for general adult medical examination without abnormal findings: Secondary | ICD-10-CM | POA: Diagnosis not present

## 2016-07-03 DIAGNOSIS — M8580 Other specified disorders of bone density and structure, unspecified site: Secondary | ICD-10-CM | POA: Diagnosis not present

## 2016-07-10 DIAGNOSIS — Z0000 Encounter for general adult medical examination without abnormal findings: Secondary | ICD-10-CM | POA: Diagnosis not present

## 2016-07-10 DIAGNOSIS — E785 Hyperlipidemia, unspecified: Secondary | ICD-10-CM | POA: Diagnosis not present

## 2016-07-10 DIAGNOSIS — N182 Chronic kidney disease, stage 2 (mild): Secondary | ICD-10-CM | POA: Diagnosis not present

## 2016-07-10 DIAGNOSIS — I129 Hypertensive chronic kidney disease with stage 1 through stage 4 chronic kidney disease, or unspecified chronic kidney disease: Secondary | ICD-10-CM | POA: Diagnosis not present

## 2016-08-17 DIAGNOSIS — R05 Cough: Secondary | ICD-10-CM | POA: Diagnosis not present

## 2016-08-17 DIAGNOSIS — H6122 Impacted cerumen, left ear: Secondary | ICD-10-CM | POA: Diagnosis not present

## 2016-08-17 DIAGNOSIS — J189 Pneumonia, unspecified organism: Secondary | ICD-10-CM | POA: Diagnosis not present

## 2016-08-21 DIAGNOSIS — R05 Cough: Secondary | ICD-10-CM | POA: Diagnosis not present

## 2016-08-21 DIAGNOSIS — R0602 Shortness of breath: Secondary | ICD-10-CM | POA: Diagnosis not present

## 2016-08-21 DIAGNOSIS — J189 Pneumonia, unspecified organism: Secondary | ICD-10-CM | POA: Diagnosis not present

## 2017-01-10 DIAGNOSIS — M8580 Other specified disorders of bone density and structure, unspecified site: Secondary | ICD-10-CM | POA: Diagnosis not present

## 2017-01-10 DIAGNOSIS — E785 Hyperlipidemia, unspecified: Secondary | ICD-10-CM | POA: Diagnosis not present

## 2017-01-10 DIAGNOSIS — I129 Hypertensive chronic kidney disease with stage 1 through stage 4 chronic kidney disease, or unspecified chronic kidney disease: Secondary | ICD-10-CM | POA: Diagnosis not present

## 2017-01-10 DIAGNOSIS — E039 Hypothyroidism, unspecified: Secondary | ICD-10-CM | POA: Diagnosis not present

## 2017-01-17 DIAGNOSIS — I129 Hypertensive chronic kidney disease with stage 1 through stage 4 chronic kidney disease, or unspecified chronic kidney disease: Secondary | ICD-10-CM | POA: Diagnosis not present

## 2017-01-17 DIAGNOSIS — M8580 Other specified disorders of bone density and structure, unspecified site: Secondary | ICD-10-CM | POA: Diagnosis not present

## 2017-01-17 DIAGNOSIS — Z23 Encounter for immunization: Secondary | ICD-10-CM | POA: Diagnosis not present

## 2017-01-17 DIAGNOSIS — E039 Hypothyroidism, unspecified: Secondary | ICD-10-CM | POA: Diagnosis not present

## 2017-01-17 DIAGNOSIS — N182 Chronic kidney disease, stage 2 (mild): Secondary | ICD-10-CM | POA: Diagnosis not present

## 2017-01-17 DIAGNOSIS — E785 Hyperlipidemia, unspecified: Secondary | ICD-10-CM | POA: Diagnosis not present

## 2017-01-17 DIAGNOSIS — H6122 Impacted cerumen, left ear: Secondary | ICD-10-CM | POA: Diagnosis not present

## 2017-05-29 ENCOUNTER — Other Ambulatory Visit: Payer: Self-pay | Admitting: Internal Medicine

## 2017-05-29 DIAGNOSIS — Z1231 Encounter for screening mammogram for malignant neoplasm of breast: Secondary | ICD-10-CM

## 2017-06-21 ENCOUNTER — Ambulatory Visit
Admission: RE | Admit: 2017-06-21 | Discharge: 2017-06-21 | Disposition: A | Discharge status: Home / Self Care | Payer: PPO | Source: Ambulatory Visit | Attending: Internal Medicine | Admitting: Internal Medicine

## 2017-06-21 ENCOUNTER — Ambulatory Visit: Payer: Self-pay

## 2017-06-21 DIAGNOSIS — Z1231 Encounter for screening mammogram for malignant neoplasm of breast: Secondary | ICD-10-CM | POA: Diagnosis not present

## 2017-07-24 DIAGNOSIS — Z0000 Encounter for general adult medical examination without abnormal findings: Secondary | ICD-10-CM | POA: Diagnosis not present

## 2017-07-24 DIAGNOSIS — M8580 Other specified disorders of bone density and structure, unspecified site: Secondary | ICD-10-CM | POA: Diagnosis not present

## 2017-07-24 DIAGNOSIS — I10 Essential (primary) hypertension: Secondary | ICD-10-CM | POA: Diagnosis not present

## 2017-07-24 DIAGNOSIS — E785 Hyperlipidemia, unspecified: Secondary | ICD-10-CM | POA: Diagnosis not present

## 2017-07-24 DIAGNOSIS — E039 Hypothyroidism, unspecified: Secondary | ICD-10-CM | POA: Diagnosis not present

## 2017-07-24 DIAGNOSIS — E559 Vitamin D deficiency, unspecified: Secondary | ICD-10-CM | POA: Diagnosis not present

## 2017-07-24 DIAGNOSIS — Z09 Encounter for follow-up examination after completed treatment for conditions other than malignant neoplasm: Secondary | ICD-10-CM | POA: Diagnosis not present

## 2017-07-31 DIAGNOSIS — K219 Gastro-esophageal reflux disease without esophagitis: Secondary | ICD-10-CM | POA: Diagnosis not present

## 2017-07-31 DIAGNOSIS — N393 Stress incontinence (female) (male): Secondary | ICD-10-CM | POA: Diagnosis not present

## 2017-07-31 DIAGNOSIS — M8580 Other specified disorders of bone density and structure, unspecified site: Secondary | ICD-10-CM | POA: Diagnosis not present

## 2017-07-31 DIAGNOSIS — M179 Osteoarthritis of knee, unspecified: Secondary | ICD-10-CM | POA: Diagnosis not present

## 2017-07-31 DIAGNOSIS — I129 Hypertensive chronic kidney disease with stage 1 through stage 4 chronic kidney disease, or unspecified chronic kidney disease: Secondary | ICD-10-CM | POA: Diagnosis not present

## 2017-07-31 DIAGNOSIS — E039 Hypothyroidism, unspecified: Secondary | ICD-10-CM | POA: Diagnosis not present

## 2017-07-31 DIAGNOSIS — I5032 Chronic diastolic (congestive) heart failure: Secondary | ICD-10-CM | POA: Diagnosis not present

## 2017-07-31 DIAGNOSIS — N182 Chronic kidney disease, stage 2 (mild): Secondary | ICD-10-CM | POA: Diagnosis not present

## 2017-07-31 DIAGNOSIS — E785 Hyperlipidemia, unspecified: Secondary | ICD-10-CM | POA: Diagnosis not present

## 2017-09-16 DIAGNOSIS — D485 Neoplasm of uncertain behavior of skin: Secondary | ICD-10-CM | POA: Diagnosis not present

## 2017-09-20 ENCOUNTER — Encounter (HOSPITAL_COMMUNITY): Payer: Self-pay | Admitting: Emergency Medicine

## 2017-09-20 ENCOUNTER — Emergency Department (HOSPITAL_COMMUNITY): Payer: PPO

## 2017-09-20 ENCOUNTER — Other Ambulatory Visit: Payer: Self-pay

## 2017-09-20 ENCOUNTER — Emergency Department (HOSPITAL_COMMUNITY)
Admission: EM | Admit: 2017-09-20 | Discharge: 2017-09-20 | Disposition: A | Discharge status: Home / Self Care | Payer: PPO | Attending: Emergency Medicine | Admitting: Emergency Medicine

## 2017-09-20 DIAGNOSIS — R1084 Generalized abdominal pain: Secondary | ICD-10-CM | POA: Diagnosis not present

## 2017-09-20 DIAGNOSIS — K5900 Constipation, unspecified: Secondary | ICD-10-CM | POA: Diagnosis not present

## 2017-09-20 DIAGNOSIS — R112 Nausea with vomiting, unspecified: Secondary | ICD-10-CM

## 2017-09-20 DIAGNOSIS — Z79899 Other long term (current) drug therapy: Secondary | ICD-10-CM | POA: Insufficient documentation

## 2017-09-20 DIAGNOSIS — R109 Unspecified abdominal pain: Secondary | ICD-10-CM | POA: Diagnosis not present

## 2017-09-20 DIAGNOSIS — I10 Essential (primary) hypertension: Secondary | ICD-10-CM | POA: Diagnosis not present

## 2017-09-20 DIAGNOSIS — E039 Hypothyroidism, unspecified: Secondary | ICD-10-CM | POA: Insufficient documentation

## 2017-09-20 DIAGNOSIS — Z87440 Personal history of urinary (tract) infections: Secondary | ICD-10-CM | POA: Insufficient documentation

## 2017-09-20 DIAGNOSIS — K449 Diaphragmatic hernia without obstruction or gangrene: Secondary | ICD-10-CM | POA: Diagnosis not present

## 2017-09-20 DIAGNOSIS — I5032 Chronic diastolic (congestive) heart failure: Secondary | ICD-10-CM | POA: Insufficient documentation

## 2017-09-20 LAB — CBC WITH DIFFERENTIAL/PLATELET
Basophils Absolute: 0 10*3/uL (ref 0.0–0.1)
Basophils Relative: 0 %
Eosinophils Absolute: 0.1 10*3/uL (ref 0.0–0.7)
Eosinophils Relative: 1 %
HEMATOCRIT: 36.1 % (ref 36.0–46.0)
Hemoglobin: 12.8 g/dL (ref 12.0–15.0)
LYMPHS ABS: 1.9 10*3/uL (ref 0.7–4.0)
LYMPHS PCT: 28 %
MCH: 32.8 pg (ref 26.0–34.0)
MCHC: 35.5 g/dL (ref 30.0–36.0)
MCV: 92.6 fL (ref 78.0–100.0)
Monocytes Absolute: 0.7 10*3/uL (ref 0.1–1.0)
Monocytes Relative: 10 %
NEUTROS ABS: 4.1 10*3/uL (ref 1.7–7.7)
Neutrophils Relative %: 61 %
PLATELETS: 352 10*3/uL (ref 150–400)
RBC: 3.9 MIL/uL (ref 3.87–5.11)
RDW: 11.8 % (ref 11.5–15.5)
WBC: 6.8 10*3/uL (ref 4.0–10.5)

## 2017-09-20 LAB — COMPREHENSIVE METABOLIC PANEL
ALK PHOS: 63 U/L (ref 38–126)
ALT: 18 U/L (ref 14–54)
AST: 23 U/L (ref 15–41)
Albumin: 4.1 g/dL (ref 3.5–5.0)
Anion gap: 13 (ref 5–15)
BUN: 12 mg/dL (ref 6–20)
CALCIUM: 9.1 mg/dL (ref 8.9–10.3)
CO2: 22 mmol/L (ref 22–32)
CREATININE: 0.78 mg/dL (ref 0.44–1.00)
Chloride: 91 mmol/L — ABNORMAL LOW (ref 101–111)
GFR calc Af Amer: >60 mL/min (ref >60)
GFR calc non Af Amer: >60 mL/min (ref >60)
Glucose, Bld: 135 mg/dL — ABNORMAL HIGH (ref 65–99)
Potassium: 3.5 mmol/L (ref 3.5–5.1)
SODIUM: 126 mmol/L — AB (ref 135–145)
Total Bilirubin: 0.6 mg/dL (ref 0.3–1.2)
Total Protein: 6.9 g/dL (ref 6.5–8.1)

## 2017-09-20 LAB — URINALYSIS, ROUTINE W REFLEX MICROSCOPIC
Bilirubin Urine: NEGATIVE
GLUCOSE, UA: NEGATIVE mg/dL
HGB URINE DIPSTICK: NEGATIVE
Ketones, ur: 20 mg/dL — AB
Leukocytes, UA: NEGATIVE
Nitrite: NEGATIVE
Protein, ur: NEGATIVE mg/dL
SPECIFIC GRAVITY, URINE: 1.014 (ref 1.005–1.030)
pH: 8 (ref 5.0–8.0)

## 2017-09-20 LAB — I-STAT CG4 LACTIC ACID, ED
LACTIC ACID, VENOUS: 0.88 mmol/L (ref 0.5–1.9)
Lactic Acid, Venous: 1.95 mmol/L — ABNORMAL HIGH (ref 0.5–1.9)

## 2017-09-20 LAB — I-STAT TROPONIN, ED: Troponin i, poc: 0 ng/mL (ref 0.00–0.08)

## 2017-09-20 LAB — LIPASE, BLOOD: Lipase: 18 U/L (ref 11–51)

## 2017-09-20 MED ORDER — IOPAMIDOL (ISOVUE-300) INJECTION 61%
100.0000 mL | Freq: Once | INTRAVENOUS | Status: AC | PRN
  Administered 2017-09-20: 100 mL via INTRAVENOUS

## 2017-09-20 MED ORDER — IOPAMIDOL (ISOVUE-300) INJECTION 61%
INTRAVENOUS | Status: AC
  Filled 2017-09-20: qty 100

## 2017-09-20 MED ORDER — MILK AND MOLASSES ENEMA
1.0000 | Freq: Once | RECTAL | Status: AC
  Administered 2017-09-20: 250 mL via RECTAL
  Filled 2017-09-20: qty 250

## 2017-09-20 MED ORDER — MORPHINE SULFATE (PF) 4 MG/ML IV SOLN
4.0000 mg | Freq: Once | INTRAVENOUS | Status: AC
  Administered 2017-09-20: 4 mg via INTRAVENOUS
  Filled 2017-09-20: qty 1

## 2017-09-20 MED ORDER — SODIUM CHLORIDE 0.9 % IV BOLUS
500.0000 mL | Freq: Once | INTRAVENOUS | Status: AC
  Administered 2017-09-20: 500 mL via INTRAVENOUS

## 2017-09-20 MED ORDER — ONDANSETRON HCL 4 MG/2ML IJ SOLN
4.0000 mg | Freq: Once | INTRAMUSCULAR | Status: AC
  Administered 2017-09-20: 4 mg via INTRAVENOUS
  Filled 2017-09-20: qty 2

## 2017-09-20 MED ORDER — ACETAMINOPHEN 325 MG PO TABS
650.0000 mg | ORAL_TABLET | Freq: Once | ORAL | Status: AC
  Administered 2017-09-20: 650 mg via ORAL
  Filled 2017-09-20: qty 2

## 2017-09-20 NOTE — ED Triage Notes (Addendum)
Pt from home with c/o abdominal pain secondary to constipation. Pt reports LBM was 5/12. Pt rates pain 10/10. Pt has tried [employer] and milk of magnesia and a fleet enema with no results. Pt has hyperactive bowel sounds in all fields. Pt denies any hx of abdominal surgeries

## 2017-09-20 NOTE — ED Notes (Signed)
Pt to xray

## 2017-09-20 NOTE — Discharge Instructions (Addendum)
Starting tomorrow, take 6 capfuls of MiraLAX in a 32 oz bottle of Gatorade over 2-4 hour period.  On day 2, take 3 capfuls, three times a day.  On day 3, take 1 capful 3 times a day. Slowly cut back as needed until you have normal bowel movements.  Please follow up with your doctor. Return if worsening

## 2017-09-20 NOTE — ED Provider Notes (Signed)
84 yo female with constipation for 5 days, diffuse abdominal pain, and now vomiting. She has tried multiple methods for constipation at home with no relief. Pain and nausea controlled at this time. CT pending at shift change.  8:08 AM CT is negative for obstruction. Rectal exam was performed. Several hard stool balls were removed. Will order milk of molassess enema.  10:21 AM Enema produced a BM. She still complains of some weakness. This is likely due to N/V and evidence of dehydration. She has hyponatremia (126) and ketones in her urine. Otherwise labs are normal. She was ambulatory to the bathroom with minimal assist. Will d/c with strict return precautions.       Recardo Evangelist, PA-C 09/20/17 1022    Ripley Fraise, MD 09/20/17 [phone]

## 2017-09-20 NOTE — ED Provider Notes (Signed)
[facility] DEPT Provider Note   CSN: [id number] Arrival date & time: 09/20/17  9983     History   Chief Complaint Chief Complaint  Patient presents with  . Constipation    HPI Ellen Chandler is a 84 y.o. female with a hx of diastolic dysfunction with EF of 60-65%, GERD, hypertension, recurrent UTI presents to the Emergency Department complaining of gradual, persistent, progressively worsening abdominal pain onset this evening.  Patient reports her last bowel movement was 5 days ago.  Over the course the last 24 hours she has taken magnesium citrate, Metamucil x2 and has attempted a Fleet enema without bowel movement.  Tonight her abdominal pain worsened such that she called her daughter-in-law and asked to be brought to the emergency department.  Patient and daughter-in-law deny a history of abdominal surgeries.   Both patient and daughter-in-law deny vomiting prior to arrival here in the emergency department.  Patient denies melena, hematochezia or hematemesis.  No specific aggravating or alleviating factors.  Patient denies fever, chills, headache, neck pain, chest pain, shortness of breath, diarrhea, weakness, dizziness, syncope, dysuria.  The history is provided by the patient and medical records. No language interpreter was used.    Past Medical History:  Diagnosis Date  . Abnormal ECG    a. Pt aware of long hx of abnl ecg->h/o normal stress testing in [place] ~ 2000.  . Diastolic dysfunction    a. 04/2013 Echo: EF 60-65%, No rwma, Gr 1 DD, mild MR.  Marland Kitchen GERD (gastroesophageal reflux disease)   . Hypertension   . Hypothyroidism     Patient Active Problem List   Diagnosis Date Noted  . Chest pain 06/17/2016  . UTI (urinary tract infection) 06/17/2016  . Nonspecific abnormal electrocardiogram (ECG) (EKG) 05/06/2013  . Acute bronchitis 05/06/2013  . Essential hypertension 05/06/2013  . Acute diastolic CHF (congestive heart failure)  ([place]) 05/06/2013  . Dehydration 05/05/2013  . CAP (community acquired pneumonia) 05/05/2013  . Orthostatic hypotension 05/05/2013  . Hyponatremia 05/05/2013    Past Surgical History:  Procedure Laterality Date  . ABDOMINAL HYSTERECTOMY    . JOINT REPLACEMENT     a. knee left - injury resulted after being attacked by a dog.     OB History   None      Home Medications    Prior to Admission medications   Medication Sig Start Date End Date Taking? Authorizing Provider  acetaminophen (TYLENOL) 500 MG tablet Take 1,000 mg by mouth every 6 (six) hours as needed for moderate pain.   Yes [provider]  amLODipine (NORVASC) 5 MG tablet Take 5 mg by mouth daily. 03/20/16  Yes [provider]  aspirin 81 MG tablet Take 81 mg by mouth daily.   Yes [provider]  levothyroxine (SYNTHROID, LEVOTHROID) 88 MCG tablet Take 88 mcg by mouth daily before breakfast.   Yes [provider]  Multiple Vitamin (MULTIVITAMIN WITH MINERALS) TABS tablet Take 1 tablet by mouth daily.   Yes [provider]  omeprazole (PRILOSEC) 20 MG capsule Take 20 mg by mouth 2 (two) times daily before a meal.    Yes [provider]  oxybutynin (DITROPAN-XL) 5 MG 24 hr tablet Take 5 mg by mouth 2 (two) times daily.   Yes [provider]  Carboxymethylcellulose Sodium (REFRESH TEARS OP) Place 1 drop into both eyes every morning.     [provider]  cefpodoxime (VANTIN) 200 MG tablet Take 1 tablet (200 mg  total) by mouth every 12 (twelve) hours. Patient not taking: Reported on 09/20/2017 06/20/16   Donne Hazel, MD  diclofenac sodium (VOLTAREN) 1 % GEL Apply 2 g topically 4 (four) times daily. Patient not taking: Reported on 09/20/2017 06/20/16   Donne Hazel, MD  nabumetone (RELAFEN) 750 MG tablet Take 750 mg by mouth 2 (two) times daily as needed for mild pain.    [provider]    Family History Family History  Problem Relation Age  of Onset  . Heart attack Mother        died @ 62.  . Diabetes type II Father        died @ 19.  . Breast cancer Sister        died @ 76.    Social History Social History   Tobacco Use  . Smoking status: Never Smoker  . Smokeless tobacco: Never Used  Substance Use Topics  . Alcohol use: No  . Drug use: No     Allergies   Patient has no known allergies.   Review of Systems Review of Systems  Constitutional: Negative for appetite change, diaphoresis, fatigue, fever and unexpected weight change.  HENT: Negative for mouth sores.   Eyes: Negative for visual disturbance.  Respiratory: Negative for cough, chest tightness, shortness of breath and wheezing.   Cardiovascular: Negative for chest pain.  Gastrointestinal: Positive for abdominal pain, constipation, nausea and vomiting. Negative for diarrhea.  Endocrine: Negative for polydipsia, polyphagia and polyuria.  Genitourinary: Negative for dysuria, frequency, hematuria and urgency.  Musculoskeletal: Negative for back pain and neck stiffness.  Skin: Negative for rash.  Allergic/Immunologic: Negative for immunocompromised state.  Neurological: Negative for syncope, light-headedness and headaches.  Hematological: Does not bruise/bleed easily.  Psychiatric/Behavioral: Negative for sleep disturbance. The patient is not nervous/anxious.      Physical Exam Updated Vital Signs BP (!) 152/69 (BP Location: Right Arm)   Pulse 64   Temp (!) 97.4 F (36.3 C) (Oral)   Resp 14   Ht 5\' 4"  (1.626 m)   Wt 68 kg (150 lb)   SpO2 95%   BMI 25.75 kg/m   Physical Exam  Constitutional: She appears well-developed and well-nourished. She appears distressed.  Awake, alert,  Patient is pale, diaphoretic and actively vomiting.  HENT:  Head: Normocephalic and atraumatic.  Mouth/Throat: Oropharynx is clear and moist. No oropharyngeal exudate.  Eyes: Conjunctivae are normal. No scleral icterus.  Neck: Normal range of motion. Neck supple.    Cardiovascular: Normal rate, regular rhythm and intact distal pulses.  Pulmonary/Chest: Effort normal and breath sounds normal. No respiratory distress. She has no wheezes.  Equal chest expansion  Abdominal: Soft. Bowel sounds are normal. She exhibits distension. She exhibits no mass. There is generalized tenderness. There is rebound and guarding. There is no rigidity and no CVA tenderness.  Musculoskeletal: Normal range of motion. She exhibits no edema.  Neurological: She is alert.  Speech is clear and goal oriented Moves extremities without ataxia  Skin: Skin is warm. She is diaphoretic. There is pallor.  Psychiatric: She has a normal mood and affect.  Nursing note and vitals reviewed.    ED Treatments / Results  Labs (all labs ordered are listed, but only abnormal results are displayed) Labs Reviewed  COMPREHENSIVE METABOLIC PANEL - Abnormal; Notable for the following components:      Result Value   Sodium 126 (*)    Chloride 91 (*)    Glucose, Bld 135 (*)  All other components within normal limits  I-STAT CG4 LACTIC ACID, ED - Abnormal; Notable for the following components:   Lactic Acid, Venous 1.95 (*)    All other components within normal limits  CBC WITH DIFFERENTIAL/PLATELET  LIPASE, BLOOD  URINALYSIS, ROUTINE W REFLEX MICROSCOPIC  OCCULT BLOOD GASTRIC / DUODENUM (SPECIMEN CUP)  I-STAT TROPONIN, ED  I-STAT CG4 LACTIC ACID, ED    EKG EKG Interpretation  Date/Time:  Friday Sep 20 2017 05:17:11 EDT Ventricular Rate:  56 PR Interval:    QRS Duration: 107 QT Interval:  548 QTC Calculation: 529 R Axis:   33 Text Interpretation:  Sinus rhythm RSR' in V1 or V2, right VCD or RVH Nonspecific T abnrm, anterolateral leads Prolonged QT interval Confirmed by Ripley Fraise [phone]) on 09/20/2017 5:22:10 AM   Radiology Dg Abdomen Acute W/chest  Result Date: 09/20/2017 CLINICAL DATA:  Acute onset of generalized abdominal pain and constipation. EXAM: DG ABDOMEN ACUTE W/  1V CHEST COMPARISON:  Chest radiograph performed 06/17/2016, and CT of the abdomen and pelvis performed 10/18/2014 FINDINGS: The lungs are well-aerated. Mild left basilar airspace opacity may reflect atelectasis or mild pneumonia. There is no evidence of pleural effusion or pneumothorax. The cardiomediastinal silhouette is within normal limits. A moderate to large hiatal hernia is noted, with an air-fluid level. The visualized bowel gas pattern is unremarkable. Scattered stool and air are seen within the colon; there is no evidence of small bowel dilatation to suggest obstruction. No free intra-abdominal air is identified on the provided upright view. No acute osseous abnormalities are seen; the sacroiliac joints are unremarkable in appearance. Facet disease is noted at the lower lumbar spine. IMPRESSION: 1. Unremarkable bowel gas pattern; no free intra-abdominal air seen. Moderate amount of stool noted in the colon, which may reflect mild constipation. 2. Mild left basilar airspace opacity may reflect atelectasis or mild pneumonia. 3. Moderate to large hiatal hernia noted, with an air-fluid level. Electronically Signed   By: Garald Balding M.D.   On: 09/20/2017 05:45    Procedures Procedures (including critical care time)  Medications Ordered in ED Medications  sodium chloride 0.9 % bolus 500 mL (500 mLs Intravenous New Bag/Given 09/20/17 0726)  iopamidol (ISOVUE-300) 61 % injection (has no administration in time range)  morphine 4 MG/ML injection 4 mg (4 mg Intravenous Given 09/20/17 0525)  ondansetron (ZOFRAN) injection 4 mg (4 mg Intravenous Given 09/20/17 0525)  iopamidol (ISOVUE-300) 61 % injection 100 mL (100 mLs Intravenous Contrast Given 09/20/17 0706)     Initial Impression / Assessment and Plan / ED Course  I have reviewed the triage vital signs and the nursing notes.  Pertinent labs & imaging results that were available during my care of the patient were reviewed by me and considered in my  medical decision making (see chart for details).     Patient presents with vomiting, constipation and severe abdominal pain.  On exam she has guarding and rebound.  Concern for possible bowel obstruction versus severe constipation.  Patient given pain control, judicious fluid bolus due to her congestive heart failure and Zofran.  7:36 AM Lightly elevated lactic acid.  Acute abdominal films without obvious small bowel obstruction but hiatal hernia with fluid levels are noted.  I personally evaluated these images.  CT scan is pending.  At shift change, care transferred to Janetta Hora, PA-C who will follow imaging, reevaluate and determine disposition.  The patient was discussed with and seen by Dr. Christy Gentles who agrees with the treatment plan.  Final Clinical Impressions(s) / ED Diagnoses   Final diagnoses:  Constipation, unspecified constipation type  Generalized abdominal pain  Non-intractable vomiting with nausea, unspecified vomiting type    ED Discharge Orders    None       Agapito Games 09/20/17 2620    Ripley Fraise, MD 09/20/17 2314

## 2017-09-20 NOTE — ED Provider Notes (Signed)
Patient seen/examined in the Emergency Department in conjunction with Midlevel Provider  Patient reports abdominal pain constipation Exam : Awake alert, appears uncomfortable, diffuse abdominal tenderness, bowel sounds present Plan: CT imaging pending   Ripley Fraise, MD 09/20/17 [phone]

## 2017-09-20 NOTE — ED Notes (Signed)
Bed: [facility]3 Expected date:  Expected time:  Means of arrival:  Comments: 

## 2017-09-20 NOTE — ED Notes (Signed)
Pt. Made aware for need of urine specimen. 

## 2017-10-02 DIAGNOSIS — D485 Neoplasm of uncertain behavior of skin: Secondary | ICD-10-CM | POA: Diagnosis not present

## 2017-10-02 DIAGNOSIS — C4492 Squamous cell carcinoma of skin, unspecified: Secondary | ICD-10-CM | POA: Diagnosis not present

## 2017-10-31 DIAGNOSIS — C44629 Squamous cell carcinoma of skin of left upper limb, including shoulder: Secondary | ICD-10-CM | POA: Diagnosis not present

## 2017-12-11 DIAGNOSIS — K449 Diaphragmatic hernia without obstruction or gangrene: Secondary | ICD-10-CM | POA: Diagnosis not present

## 2017-12-11 DIAGNOSIS — I129 Hypertensive chronic kidney disease with stage 1 through stage 4 chronic kidney disease, or unspecified chronic kidney disease: Secondary | ICD-10-CM | POA: Diagnosis not present

## 2017-12-11 DIAGNOSIS — N182 Chronic kidney disease, stage 2 (mild): Secondary | ICD-10-CM | POA: Diagnosis not present

## 2017-12-16 ENCOUNTER — Other Ambulatory Visit: Payer: Self-pay | Admitting: Gastroenterology

## 2017-12-16 DIAGNOSIS — E039 Hypothyroidism, unspecified: Secondary | ICD-10-CM | POA: Diagnosis not present

## 2017-12-16 DIAGNOSIS — R1013 Epigastric pain: Secondary | ICD-10-CM

## 2017-12-16 DIAGNOSIS — K219 Gastro-esophageal reflux disease without esophagitis: Secondary | ICD-10-CM | POA: Diagnosis not present

## 2017-12-24 DIAGNOSIS — R1013 Epigastric pain: Secondary | ICD-10-CM | POA: Diagnosis not present

## 2017-12-24 DIAGNOSIS — K449 Diaphragmatic hernia without obstruction or gangrene: Secondary | ICD-10-CM | POA: Diagnosis not present

## 2017-12-25 ENCOUNTER — Ambulatory Visit
Admission: RE | Admit: 2017-12-25 | Discharge: 2017-12-25 | Disposition: A | Discharge status: Home / Self Care | Payer: PPO | Source: Ambulatory Visit | Attending: Gastroenterology | Admitting: Gastroenterology

## 2017-12-25 DIAGNOSIS — K8020 Calculus of gallbladder without cholecystitis without obstruction: Secondary | ICD-10-CM | POA: Diagnosis not present

## 2018-01-01 DIAGNOSIS — K8020 Calculus of gallbladder without cholecystitis without obstruction: Secondary | ICD-10-CM | POA: Diagnosis not present

## 2018-01-01 DIAGNOSIS — K449 Diaphragmatic hernia without obstruction or gangrene: Secondary | ICD-10-CM | POA: Diagnosis not present

## 2018-02-05 DIAGNOSIS — E785 Hyperlipidemia, unspecified: Secondary | ICD-10-CM | POA: Diagnosis not present

## 2018-02-05 DIAGNOSIS — I129 Hypertensive chronic kidney disease with stage 1 through stage 4 chronic kidney disease, or unspecified chronic kidney disease: Secondary | ICD-10-CM | POA: Diagnosis not present

## 2018-02-07 DIAGNOSIS — E871 Hypo-osmolality and hyponatremia: Secondary | ICD-10-CM | POA: Diagnosis not present

## 2018-02-07 DIAGNOSIS — K219 Gastro-esophageal reflux disease without esophagitis: Secondary | ICD-10-CM | POA: Diagnosis not present

## 2018-02-07 DIAGNOSIS — E039 Hypothyroidism, unspecified: Secondary | ICD-10-CM | POA: Diagnosis not present

## 2018-02-11 DIAGNOSIS — E039 Hypothyroidism, unspecified: Secondary | ICD-10-CM | POA: Diagnosis not present

## 2018-02-11 DIAGNOSIS — K219 Gastro-esophageal reflux disease without esophagitis: Secondary | ICD-10-CM | POA: Diagnosis not present

## 2018-02-11 DIAGNOSIS — E871 Hypo-osmolality and hyponatremia: Secondary | ICD-10-CM | POA: Diagnosis not present

## 2018-02-12 DIAGNOSIS — Z23 Encounter for immunization: Secondary | ICD-10-CM | POA: Diagnosis not present

## 2018-02-12 DIAGNOSIS — E871 Hypo-osmolality and hyponatremia: Secondary | ICD-10-CM | POA: Diagnosis not present

## 2018-02-12 DIAGNOSIS — I129 Hypertensive chronic kidney disease with stage 1 through stage 4 chronic kidney disease, or unspecified chronic kidney disease: Secondary | ICD-10-CM | POA: Diagnosis not present

## 2018-02-12 DIAGNOSIS — E785 Hyperlipidemia, unspecified: Secondary | ICD-10-CM | POA: Diagnosis not present

## 2018-02-12 DIAGNOSIS — I5032 Chronic diastolic (congestive) heart failure: Secondary | ICD-10-CM | POA: Diagnosis not present

## 2018-02-12 DIAGNOSIS — E039 Hypothyroidism, unspecified: Secondary | ICD-10-CM | POA: Diagnosis not present

## 2018-02-18 DIAGNOSIS — M859 Disorder of bone density and structure, unspecified: Secondary | ICD-10-CM | POA: Diagnosis not present

## 2018-02-18 DIAGNOSIS — M8580 Other specified disorders of bone density and structure, unspecified site: Secondary | ICD-10-CM | POA: Diagnosis not present

## 2018-02-18 DIAGNOSIS — E871 Hypo-osmolality and hyponatremia: Secondary | ICD-10-CM | POA: Diagnosis not present

## 2018-02-25 DIAGNOSIS — E871 Hypo-osmolality and hyponatremia: Secondary | ICD-10-CM | POA: Diagnosis not present

## 2018-03-27 DIAGNOSIS — K219 Gastro-esophageal reflux disease without esophagitis: Secondary | ICD-10-CM | POA: Diagnosis not present

## 2018-03-27 DIAGNOSIS — I129 Hypertensive chronic kidney disease with stage 1 through stage 4 chronic kidney disease, or unspecified chronic kidney disease: Secondary | ICD-10-CM | POA: Diagnosis not present

## 2018-03-27 DIAGNOSIS — K449 Diaphragmatic hernia without obstruction or gangrene: Secondary | ICD-10-CM | POA: Diagnosis not present

## 2018-03-27 DIAGNOSIS — Z79899 Other long term (current) drug therapy: Secondary | ICD-10-CM | POA: Diagnosis not present

## 2018-03-27 DIAGNOSIS — N182 Chronic kidney disease, stage 2 (mild): Secondary | ICD-10-CM | POA: Diagnosis not present

## 2018-05-21 DIAGNOSIS — N182 Chronic kidney disease, stage 2 (mild): Secondary | ICD-10-CM | POA: Diagnosis not present

## 2018-05-21 DIAGNOSIS — I129 Hypertensive chronic kidney disease with stage 1 through stage 4 chronic kidney disease, or unspecified chronic kidney disease: Secondary | ICD-10-CM | POA: Diagnosis not present

## 2018-05-21 DIAGNOSIS — E871 Hypo-osmolality and hyponatremia: Secondary | ICD-10-CM | POA: Diagnosis not present

## 2018-05-21 DIAGNOSIS — N393 Stress incontinence (female) (male): Secondary | ICD-10-CM | POA: Diagnosis not present

## 2018-05-21 DIAGNOSIS — I5032 Chronic diastolic (congestive) heart failure: Secondary | ICD-10-CM | POA: Diagnosis not present

## 2018-07-07 IMAGING — CR DG CHEST 2V
2 series · 2 of 2 positions shown · non-contrast
Comparison: None.

CLINICAL DATA: Left-sided chest pain shortness of Breath

EXAM:
CHEST  2 VIEW

[w chest lat]
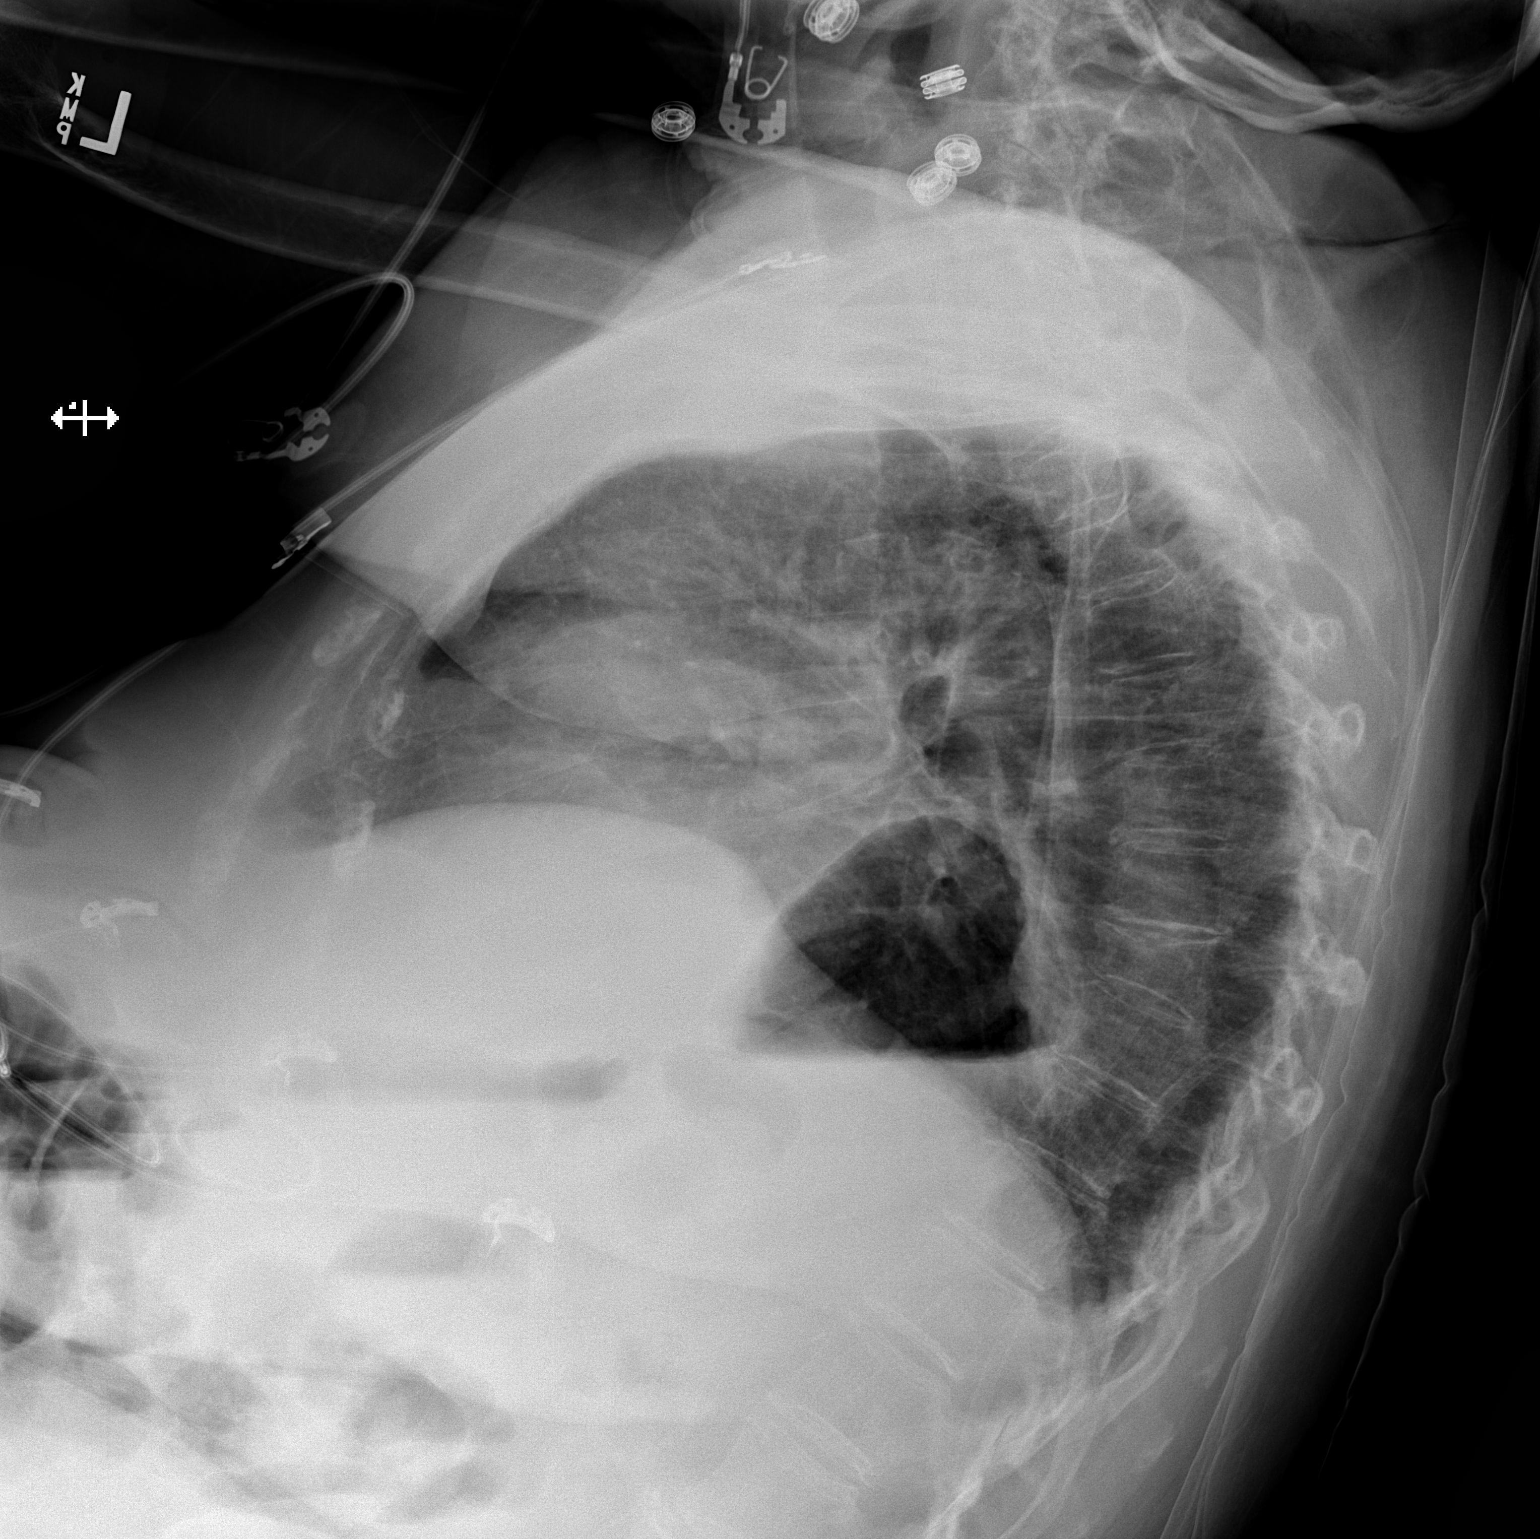

[x chest ap]
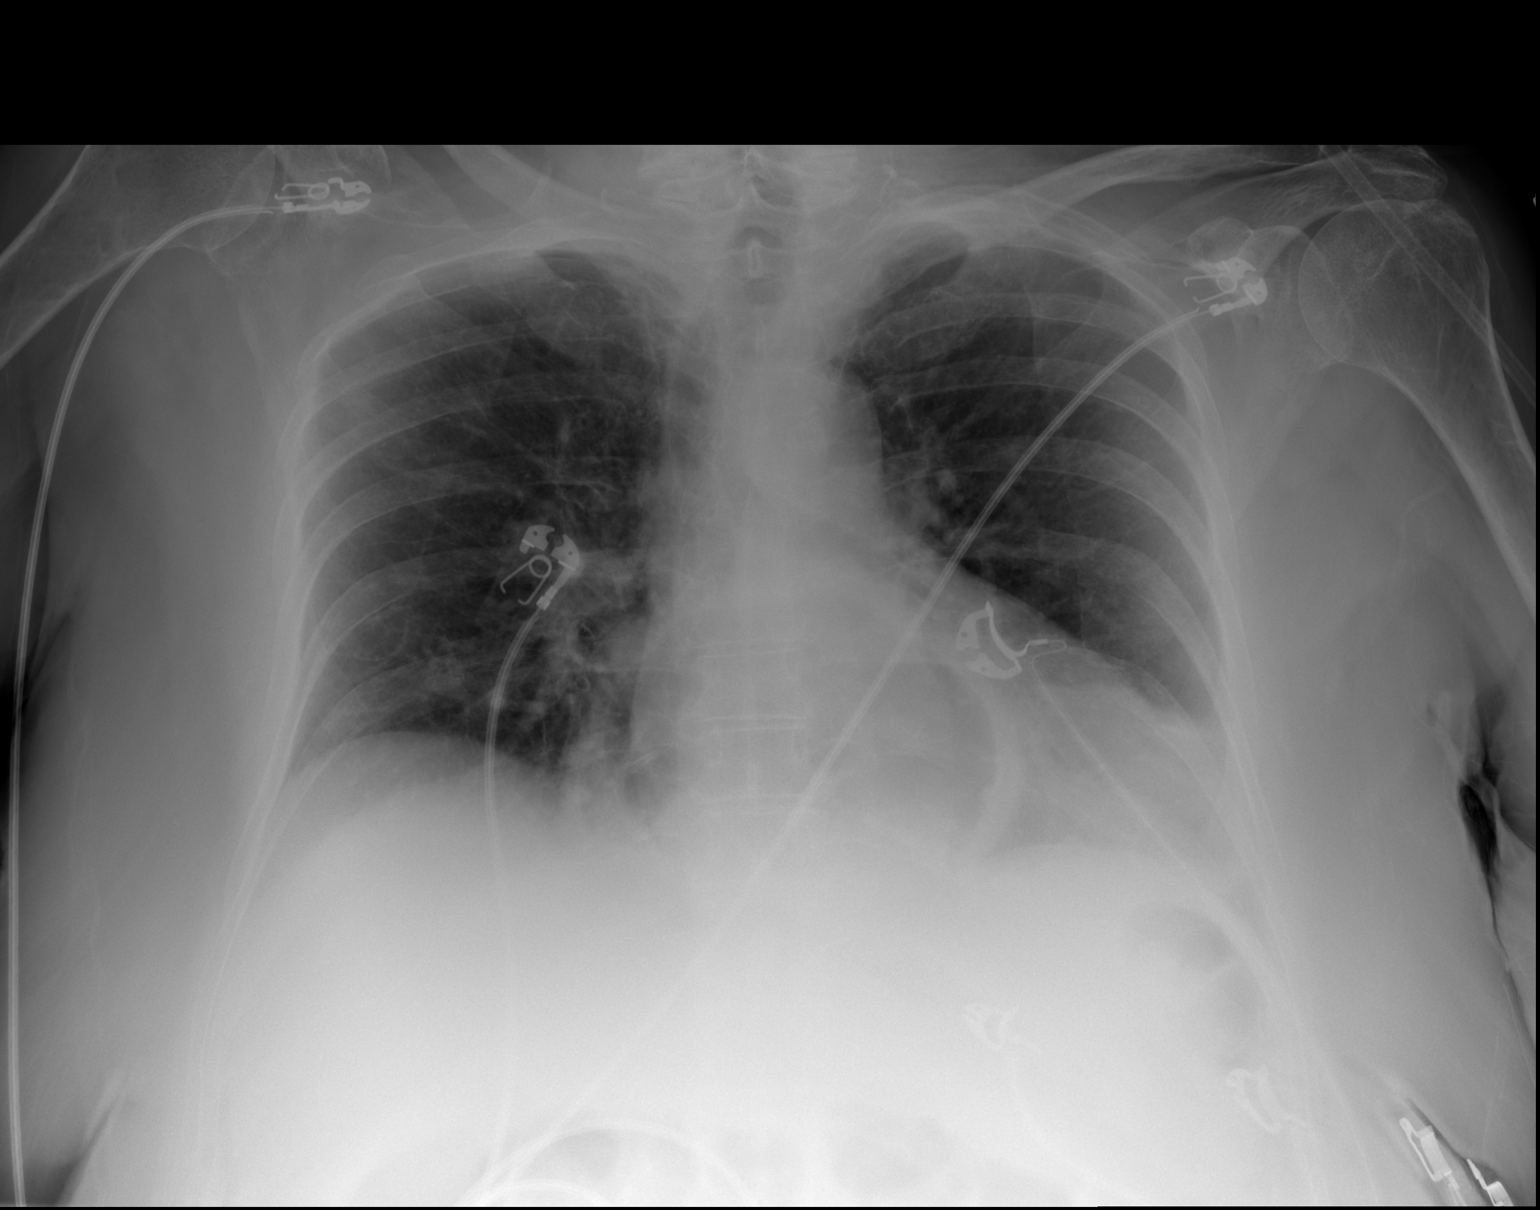

[2 of 2 positions shown; findings below may reference images not displayed]

FINDINGS: Cardiac shadow is within normal limits. A large hiatal hernia is
noted. The lungs are well aerated without focal infiltrate. Mid
thoracic compression deformity is noted of uncertain age.
Degenerative changes of the shoulder joints are seen.
IMPRESSION: Chronic appearing changes as described.

## 2018-11-06 DIAGNOSIS — M1711 Unilateral primary osteoarthritis, right knee: Secondary | ICD-10-CM | POA: Diagnosis not present

## 2018-11-06 DIAGNOSIS — M4316 Spondylolisthesis, lumbar region: Secondary | ICD-10-CM | POA: Diagnosis not present

## 2018-11-06 DIAGNOSIS — M25462 Effusion, left knee: Secondary | ICD-10-CM | POA: Diagnosis not present

## 2018-11-06 DIAGNOSIS — Z79899 Other long term (current) drug therapy: Secondary | ICD-10-CM | POA: Diagnosis not present

## 2018-11-06 DIAGNOSIS — M25569 Pain in unspecified knee: Secondary | ICD-10-CM | POA: Diagnosis not present

## 2018-11-06 DIAGNOSIS — M1990 Unspecified osteoarthritis, unspecified site: Secondary | ICD-10-CM | POA: Diagnosis not present

## 2018-11-06 DIAGNOSIS — M25561 Pain in right knee: Secondary | ICD-10-CM | POA: Diagnosis not present

## 2018-11-06 DIAGNOSIS — M47816 Spondylosis without myelopathy or radiculopathy, lumbar region: Secondary | ICD-10-CM | POA: Diagnosis not present

## 2018-11-06 DIAGNOSIS — M549 Dorsalgia, unspecified: Secondary | ICD-10-CM | POA: Diagnosis not present

## 2018-11-06 DIAGNOSIS — M546 Pain in thoracic spine: Secondary | ICD-10-CM | POA: Diagnosis not present

## 2018-11-06 DIAGNOSIS — Z96652 Presence of left artificial knee joint: Secondary | ICD-10-CM | POA: Diagnosis not present

## 2018-11-06 DIAGNOSIS — M25461 Effusion, right knee: Secondary | ICD-10-CM | POA: Diagnosis not present

## 2018-11-17 DIAGNOSIS — I10 Essential (primary) hypertension: Secondary | ICD-10-CM | POA: Diagnosis not present

## 2018-11-17 DIAGNOSIS — E039 Hypothyroidism, unspecified: Secondary | ICD-10-CM | POA: Diagnosis not present

## 2018-11-17 DIAGNOSIS — E785 Hyperlipidemia, unspecified: Secondary | ICD-10-CM | POA: Diagnosis not present

## 2018-11-24 DIAGNOSIS — Z0000 Encounter for general adult medical examination without abnormal findings: Secondary | ICD-10-CM | POA: Diagnosis not present

## 2018-11-24 DIAGNOSIS — E039 Hypothyroidism, unspecified: Secondary | ICD-10-CM | POA: Diagnosis not present

## 2018-11-24 DIAGNOSIS — K130 Diseases of lips: Secondary | ICD-10-CM | POA: Diagnosis not present

## 2018-11-24 DIAGNOSIS — N182 Chronic kidney disease, stage 2 (mild): Secondary | ICD-10-CM | POA: Diagnosis not present

## 2018-11-24 DIAGNOSIS — M8580 Other specified disorders of bone density and structure, unspecified site: Secondary | ICD-10-CM | POA: Diagnosis not present

## 2018-11-24 DIAGNOSIS — E785 Hyperlipidemia, unspecified: Secondary | ICD-10-CM | POA: Diagnosis not present

## 2018-11-24 DIAGNOSIS — I10 Essential (primary) hypertension: Secondary | ICD-10-CM | POA: Diagnosis not present

## 2018-11-24 DIAGNOSIS — I5032 Chronic diastolic (congestive) heart failure: Secondary | ICD-10-CM | POA: Diagnosis not present

## 2018-11-24 DIAGNOSIS — Z79899 Other long term (current) drug therapy: Secondary | ICD-10-CM | POA: Diagnosis not present

## 2018-11-24 DIAGNOSIS — K219 Gastro-esophageal reflux disease without esophagitis: Secondary | ICD-10-CM | POA: Diagnosis not present

## 2018-11-24 DIAGNOSIS — K8020 Calculus of gallbladder without cholecystitis without obstruction: Secondary | ICD-10-CM | POA: Diagnosis not present

## 2018-11-24 DIAGNOSIS — N393 Stress incontinence (female) (male): Secondary | ICD-10-CM | POA: Diagnosis not present

## 2019-01-26 DIAGNOSIS — Z9189 Other specified personal risk factors, not elsewhere classified: Secondary | ICD-10-CM | POA: Diagnosis not present

## 2019-01-26 DIAGNOSIS — M8949 Other hypertrophic osteoarthropathy, multiple sites: Secondary | ICD-10-CM | POA: Diagnosis not present

## 2019-01-26 DIAGNOSIS — M8580 Other specified disorders of bone density and structure, unspecified site: Secondary | ICD-10-CM | POA: Diagnosis not present

## 2019-02-06 DIAGNOSIS — M25569 Pain in unspecified knee: Secondary | ICD-10-CM | POA: Diagnosis not present

## 2019-02-06 DIAGNOSIS — M1990 Unspecified osteoarthritis, unspecified site: Secondary | ICD-10-CM | POA: Diagnosis not present

## 2019-02-06 DIAGNOSIS — M549 Dorsalgia, unspecified: Secondary | ICD-10-CM | POA: Diagnosis not present

## 2019-02-06 DIAGNOSIS — Z79899 Other long term (current) drug therapy: Secondary | ICD-10-CM | POA: Diagnosis not present

## 2019-02-25 DIAGNOSIS — M8949 Other hypertrophic osteoarthropathy, multiple sites: Secondary | ICD-10-CM | POA: Diagnosis not present

## 2019-02-25 DIAGNOSIS — M8580 Other specified disorders of bone density and structure, unspecified site: Secondary | ICD-10-CM | POA: Diagnosis not present

## 2019-02-25 DIAGNOSIS — M8589 Other specified disorders of bone density and structure, multiple sites: Secondary | ICD-10-CM | POA: Diagnosis not present

## 2019-02-25 DIAGNOSIS — Z9189 Other specified personal risk factors, not elsewhere classified: Secondary | ICD-10-CM | POA: Diagnosis not present

## 2019-05-28 DIAGNOSIS — E785 Hyperlipidemia, unspecified: Secondary | ICD-10-CM | POA: Diagnosis not present

## 2019-05-28 DIAGNOSIS — E039 Hypothyroidism, unspecified: Secondary | ICD-10-CM | POA: Diagnosis not present

## 2019-05-28 DIAGNOSIS — I10 Essential (primary) hypertension: Secondary | ICD-10-CM | POA: Diagnosis not present

## 2019-06-04 DIAGNOSIS — I129 Hypertensive chronic kidney disease with stage 1 through stage 4 chronic kidney disease, or unspecified chronic kidney disease: Secondary | ICD-10-CM | POA: Diagnosis not present

## 2019-06-04 DIAGNOSIS — E871 Hypo-osmolality and hyponatremia: Secondary | ICD-10-CM | POA: Diagnosis not present

## 2019-06-04 DIAGNOSIS — M1991 Primary osteoarthritis, unspecified site: Secondary | ICD-10-CM | POA: Diagnosis not present

## 2019-06-24 DIAGNOSIS — M1990 Unspecified osteoarthritis, unspecified site: Secondary | ICD-10-CM | POA: Diagnosis not present

## 2019-06-24 DIAGNOSIS — Z79899 Other long term (current) drug therapy: Secondary | ICD-10-CM | POA: Diagnosis not present

## 2019-06-24 DIAGNOSIS — M2550 Pain in unspecified joint: Secondary | ICD-10-CM | POA: Diagnosis not present

## 2019-06-24 DIAGNOSIS — M25569 Pain in unspecified knee: Secondary | ICD-10-CM | POA: Diagnosis not present

## 2019-06-24 DIAGNOSIS — M549 Dorsalgia, unspecified: Secondary | ICD-10-CM | POA: Diagnosis not present

## 2019-11-27 DIAGNOSIS — E785 Hyperlipidemia, unspecified: Secondary | ICD-10-CM | POA: Diagnosis not present

## 2019-11-27 DIAGNOSIS — N390 Urinary tract infection, site not specified: Secondary | ICD-10-CM | POA: Diagnosis not present

## 2019-11-27 DIAGNOSIS — E039 Hypothyroidism, unspecified: Secondary | ICD-10-CM | POA: Diagnosis not present

## 2019-12-02 DIAGNOSIS — E785 Hyperlipidemia, unspecified: Secondary | ICD-10-CM | POA: Diagnosis not present

## 2019-12-02 DIAGNOSIS — N393 Stress incontinence (female) (male): Secondary | ICD-10-CM | POA: Diagnosis not present

## 2019-12-02 DIAGNOSIS — M1990 Unspecified osteoarthritis, unspecified site: Secondary | ICD-10-CM | POA: Diagnosis not present

## 2019-12-02 DIAGNOSIS — M179 Osteoarthritis of knee, unspecified: Secondary | ICD-10-CM | POA: Diagnosis not present

## 2019-12-02 DIAGNOSIS — K8020 Calculus of gallbladder without cholecystitis without obstruction: Secondary | ICD-10-CM | POA: Diagnosis not present

## 2019-12-02 DIAGNOSIS — Z0000 Encounter for general adult medical examination without abnormal findings: Secondary | ICD-10-CM | POA: Diagnosis not present

## 2019-12-02 DIAGNOSIS — K219 Gastro-esophageal reflux disease without esophagitis: Secondary | ICD-10-CM | POA: Diagnosis not present

## 2019-12-02 DIAGNOSIS — E871 Hypo-osmolality and hyponatremia: Secondary | ICD-10-CM | POA: Diagnosis not present

## 2019-12-02 DIAGNOSIS — I5032 Chronic diastolic (congestive) heart failure: Secondary | ICD-10-CM | POA: Diagnosis not present

## 2019-12-02 DIAGNOSIS — M8580 Other specified disorders of bone density and structure, unspecified site: Secondary | ICD-10-CM | POA: Diagnosis not present

## 2019-12-02 DIAGNOSIS — E039 Hypothyroidism, unspecified: Secondary | ICD-10-CM | POA: Diagnosis not present

## 2019-12-02 DIAGNOSIS — I129 Hypertensive chronic kidney disease with stage 1 through stage 4 chronic kidney disease, or unspecified chronic kidney disease: Secondary | ICD-10-CM | POA: Diagnosis not present

## 2019-12-12 IMAGING — US US ABDOMEN LIMITED
1 series · 14 of 25 positions shown · non-contrast
Comparison: None.

CLINICAL DATA: Intermittent epigastric pain chronically.

EXAM:
ULTRASOUND ABDOMEN LIMITED RIGHT UPPER QUADRANT

[Series 1: us abdomen limited · 0.23mm/px · 14 of 35 slices shown]
[im 1/35]
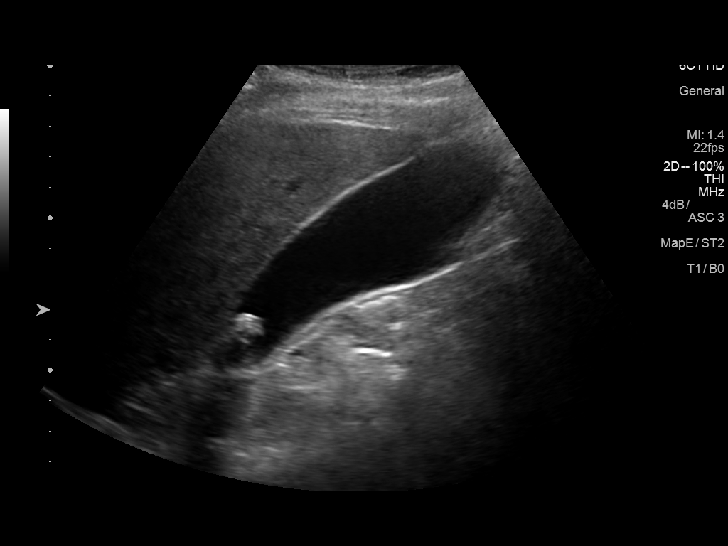
[im 3/35]
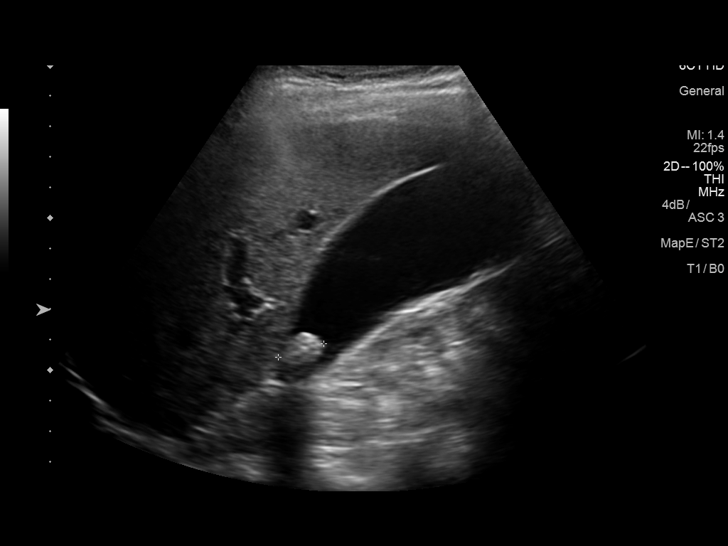
[im 6/35]
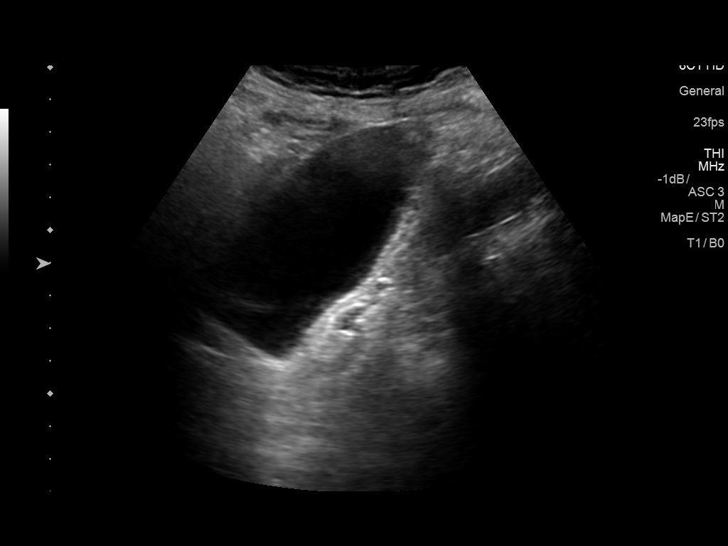
[im 9/35]
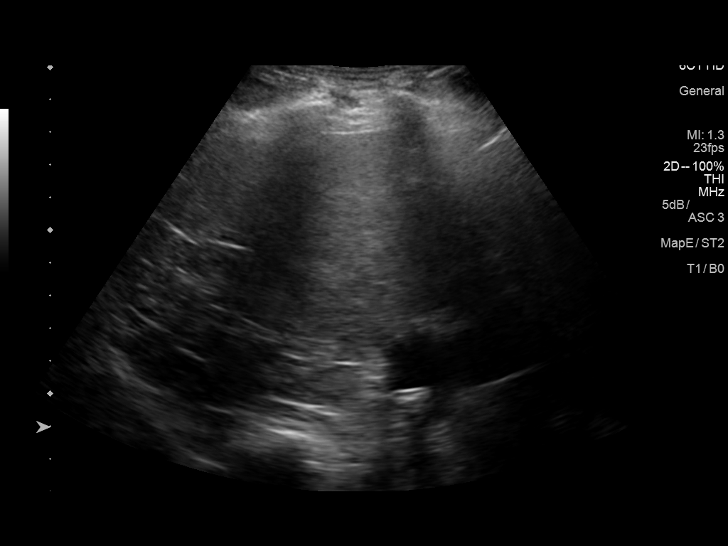
[im 12/35]
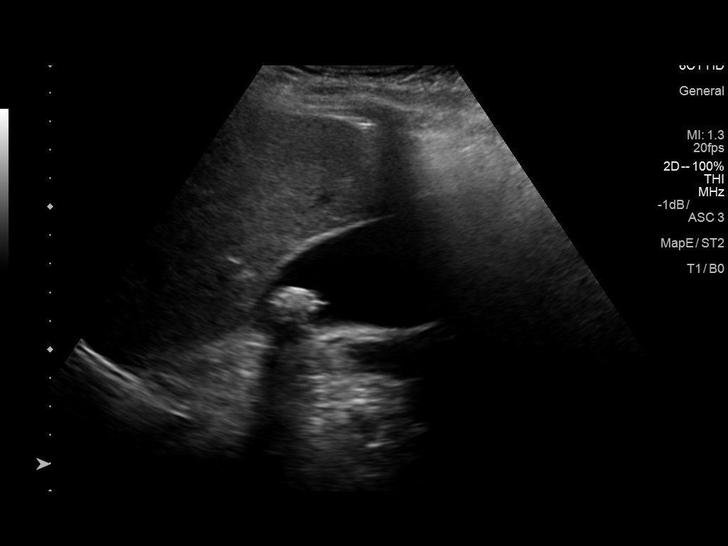
[im 13/35]
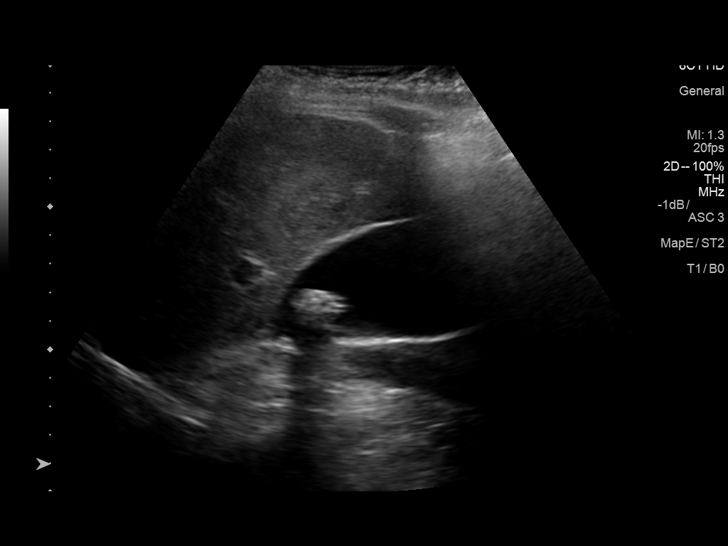
[im 16/35]
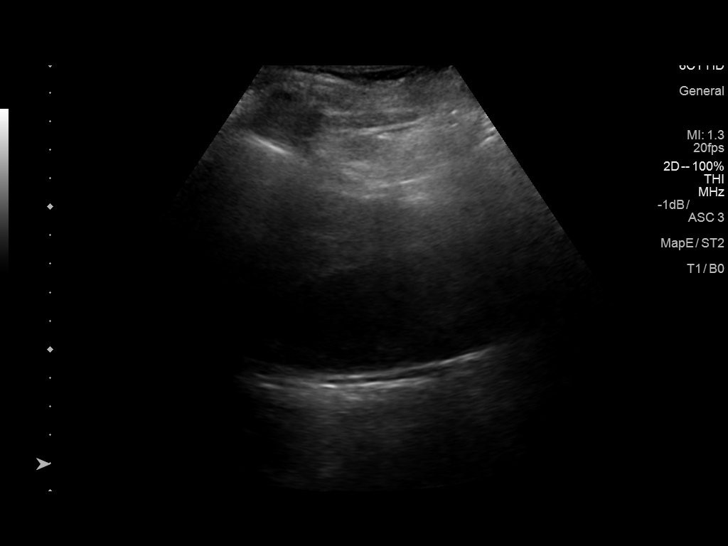
[im 19/35]
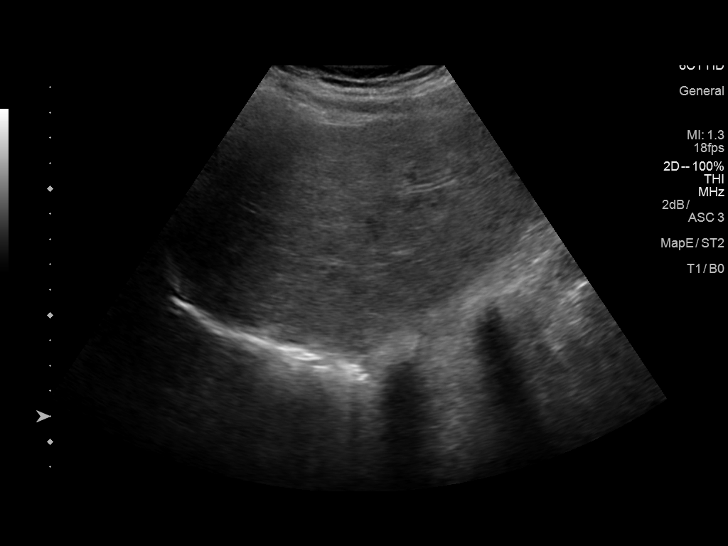
[im 22/35]
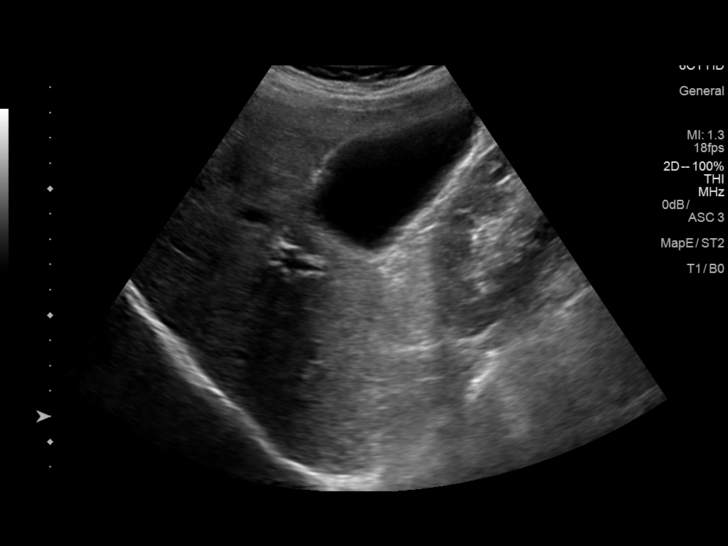
[im 23/35]
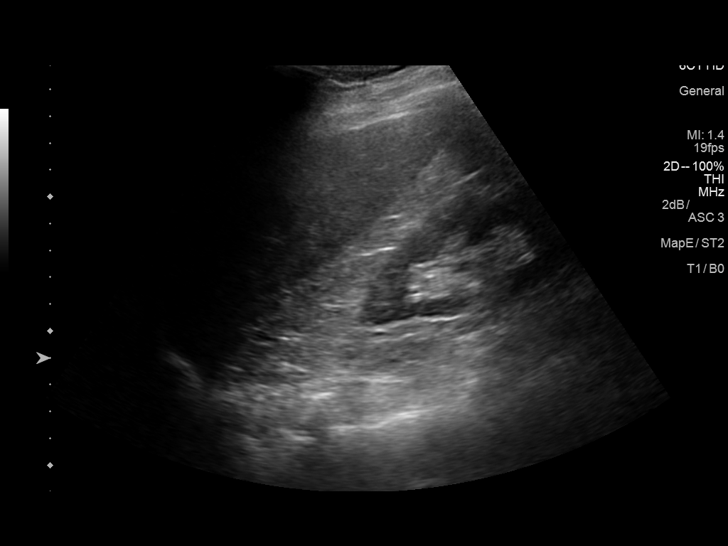
[im 26/35]
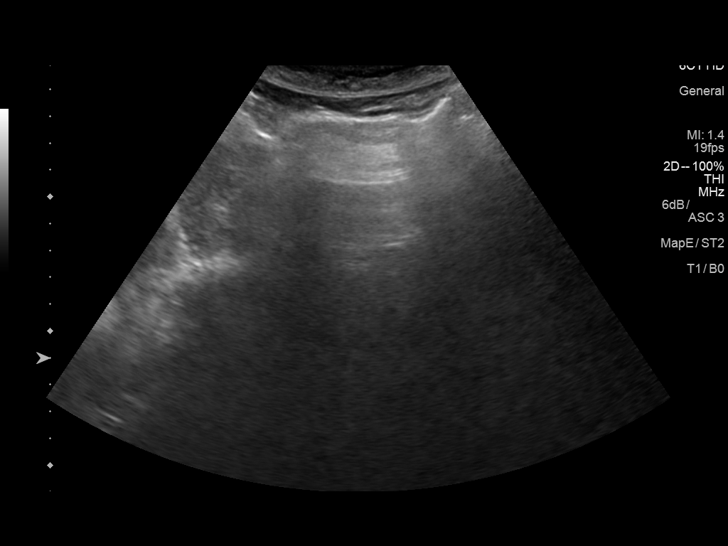
[im 29/35]
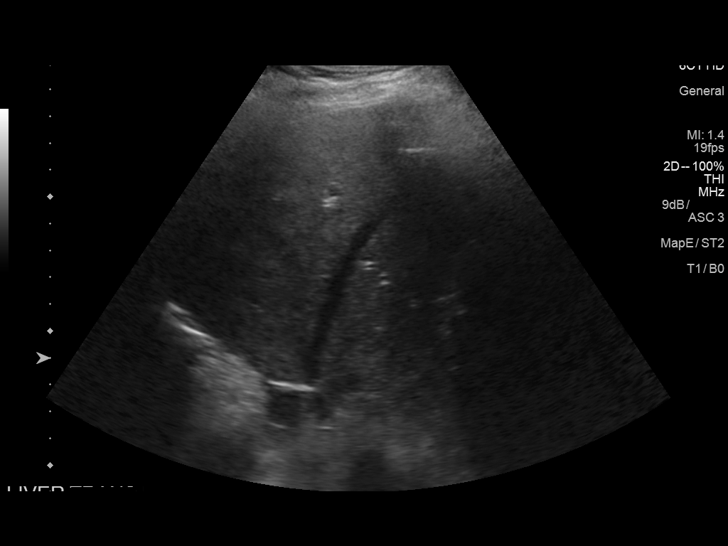
[im 32/35]
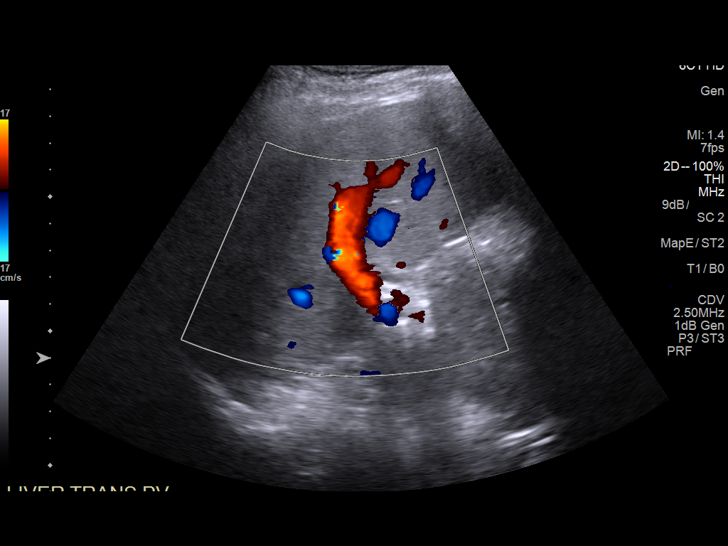
[im 35/35]
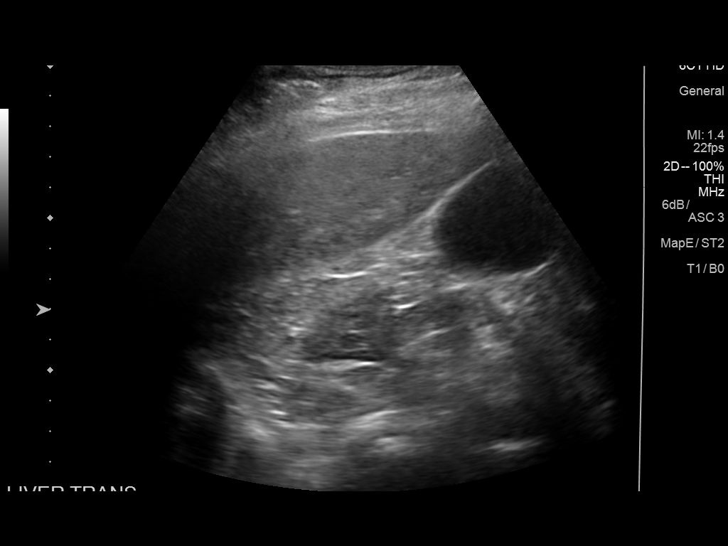

[14 of 25 positions shown; findings below may reference images not displayed]

FINDINGS: Gallbladder:

Single dependent 1.6 cm stone. No gallbladder wall thickening or
pericholecystic fluid. Negative sonographic Murphy sign.

Common bile duct:

Diameter: 4.2 mm.

Liver:

No focal lesion identified. Within normal limits in parenchymal
echogenicity. Portal vein is patent on color Doppler imaging with
normal direction of blood flow towards the liver.
IMPRESSION: Single 1.6 cm gallstone. No additional sonographic evidence of
cholecystitis.

## 2020-01-05 DIAGNOSIS — M1990 Unspecified osteoarthritis, unspecified site: Secondary | ICD-10-CM | POA: Diagnosis not present

## 2020-01-05 DIAGNOSIS — I5032 Chronic diastolic (congestive) heart failure: Secondary | ICD-10-CM | POA: Diagnosis not present

## 2020-01-05 DIAGNOSIS — I129 Hypertensive chronic kidney disease with stage 1 through stage 4 chronic kidney disease, or unspecified chronic kidney disease: Secondary | ICD-10-CM | POA: Diagnosis not present

## 2020-01-05 DIAGNOSIS — B370 Candidal stomatitis: Secondary | ICD-10-CM | POA: Diagnosis not present

## 2020-01-05 DIAGNOSIS — K5904 Chronic idiopathic constipation: Secondary | ICD-10-CM | POA: Diagnosis not present

## 2020-01-05 DIAGNOSIS — K219 Gastro-esophageal reflux disease without esophagitis: Secondary | ICD-10-CM | POA: Diagnosis not present

## 2020-01-05 DIAGNOSIS — E871 Hypo-osmolality and hyponatremia: Secondary | ICD-10-CM | POA: Diagnosis not present

## 2020-01-05 DIAGNOSIS — E039 Hypothyroidism, unspecified: Secondary | ICD-10-CM | POA: Diagnosis not present

## 2020-03-08 DIAGNOSIS — M810 Age-related osteoporosis without current pathological fracture: Secondary | ICD-10-CM | POA: Diagnosis not present

## 2020-03-08 DIAGNOSIS — E785 Hyperlipidemia, unspecified: Secondary | ICD-10-CM | POA: Diagnosis not present

## 2020-03-08 DIAGNOSIS — E039 Hypothyroidism, unspecified: Secondary | ICD-10-CM | POA: Diagnosis not present

## 2020-03-08 DIAGNOSIS — I129 Hypertensive chronic kidney disease with stage 1 through stage 4 chronic kidney disease, or unspecified chronic kidney disease: Secondary | ICD-10-CM | POA: Diagnosis not present

## 2020-03-15 DIAGNOSIS — H2511 Age-related nuclear cataract, right eye: Secondary | ICD-10-CM | POA: Diagnosis not present

## 2020-03-15 DIAGNOSIS — H18413 Arcus senilis, bilateral: Secondary | ICD-10-CM | POA: Diagnosis not present

## 2020-03-15 DIAGNOSIS — H25013 Cortical age-related cataract, bilateral: Secondary | ICD-10-CM | POA: Diagnosis not present

## 2020-03-15 DIAGNOSIS — H2513 Age-related nuclear cataract, bilateral: Secondary | ICD-10-CM | POA: Diagnosis not present

## 2020-03-15 DIAGNOSIS — H25043 Posterior subcapsular polar age-related cataract, bilateral: Secondary | ICD-10-CM | POA: Diagnosis not present

## 2020-04-28 ENCOUNTER — Other Ambulatory Visit: Payer: Self-pay

## 2020-04-28 ENCOUNTER — Emergency Department (HOSPITAL_COMMUNITY)
Admission: EM | Admit: 2020-04-28 | Discharge: 2020-04-28 | Disposition: A | Discharge status: Home / Self Care | Payer: PPO | Attending: Emergency Medicine | Admitting: Emergency Medicine

## 2020-04-28 ENCOUNTER — Encounter (HOSPITAL_COMMUNITY): Payer: Self-pay | Admitting: Student

## 2020-04-28 DIAGNOSIS — F32A Depression, unspecified: Secondary | ICD-10-CM | POA: Diagnosis not present

## 2020-04-28 DIAGNOSIS — E871 Hypo-osmolality and hyponatremia: Secondary | ICD-10-CM

## 2020-04-28 DIAGNOSIS — F1010 Alcohol abuse, uncomplicated: Secondary | ICD-10-CM | POA: Diagnosis not present

## 2020-04-28 DIAGNOSIS — F329 Major depressive disorder, single episode, unspecified: Secondary | ICD-10-CM | POA: Diagnosis not present

## 2020-04-28 DIAGNOSIS — Z79899 Other long term (current) drug therapy: Secondary | ICD-10-CM | POA: Diagnosis not present

## 2020-04-28 DIAGNOSIS — Z96652 Presence of left artificial knee joint: Secondary | ICD-10-CM | POA: Insufficient documentation

## 2020-04-28 DIAGNOSIS — I5031 Acute diastolic (congestive) heart failure: Secondary | ICD-10-CM | POA: Diagnosis not present

## 2020-04-28 DIAGNOSIS — Z7982 Long term (current) use of aspirin: Secondary | ICD-10-CM | POA: Diagnosis not present

## 2020-04-28 DIAGNOSIS — Z20822 Contact with and (suspected) exposure to covid-19: Secondary | ICD-10-CM | POA: Diagnosis not present

## 2020-04-28 DIAGNOSIS — F10929 Alcohol use, unspecified with intoxication, unspecified: Secondary | ICD-10-CM | POA: Diagnosis not present

## 2020-04-28 DIAGNOSIS — I110 Hypertensive heart disease with heart failure: Secondary | ICD-10-CM | POA: Diagnosis not present

## 2020-04-28 DIAGNOSIS — R000 Tachycardia, unspecified: Secondary | ICD-10-CM | POA: Diagnosis not present

## 2020-04-28 DIAGNOSIS — I10 Essential (primary) hypertension: Secondary | ICD-10-CM | POA: Diagnosis not present

## 2020-04-28 DIAGNOSIS — R0902 Hypoxemia: Secondary | ICD-10-CM | POA: Diagnosis not present

## 2020-04-28 DIAGNOSIS — R251 Tremor, unspecified: Secondary | ICD-10-CM | POA: Diagnosis present

## 2020-04-28 DIAGNOSIS — E039 Hypothyroidism, unspecified: Secondary | ICD-10-CM | POA: Insufficient documentation

## 2020-04-28 LAB — COMPREHENSIVE METABOLIC PANEL
ALT: 25 U/L (ref 0–44)
AST: 36 U/L (ref 15–41)
Albumin: 4 g/dL (ref 3.5–5.0)
Alkaline Phosphatase: 58 U/L (ref 38–126)
Anion gap: 15 (ref 5–15)
BUN: 14 mg/dL (ref 8–23)
CO2: 21 mmol/L — ABNORMAL LOW (ref 22–32)
Calcium: 8.6 mg/dL — ABNORMAL LOW (ref 8.9–10.3)
Chloride: 93 mmol/L — ABNORMAL LOW (ref 98–111)
Creatinine, Ser: 0.78 mg/dL (ref 0.44–1.00)
GFR, Estimated: >60 mL/min (ref >60)
Glucose, Bld: 81 mg/dL (ref 70–99)
Potassium: 4 mmol/L (ref 3.5–5.1)
Sodium: 129 mmol/L — ABNORMAL LOW (ref 135–145)
Total Bilirubin: 0.6 mg/dL (ref 0.3–1.2)
Total Protein: 6.5 g/dL (ref 6.5–8.1)

## 2020-04-28 LAB — RAPID URINE DRUG SCREEN, HOSP PERFORMED
Amphetamines: NOT DETECTED
Barbiturates: NOT DETECTED
Benzodiazepines: NOT DETECTED
Cocaine: NOT DETECTED
Opiates: NOT DETECTED
Tetrahydrocannabinol: NOT DETECTED

## 2020-04-28 LAB — CBC WITH DIFFERENTIAL/PLATELET
Abs Immature Granulocytes: 0.01 10*3/uL (ref 0.00–0.07)
Basophils Absolute: 0 10*3/uL (ref 0.0–0.1)
Basophils Relative: 1 %
Eosinophils Absolute: 0 10*3/uL (ref 0.0–0.5)
Eosinophils Relative: 1 %
HCT: 37.7 % (ref 36.0–46.0)
Hemoglobin: 13.3 g/dL (ref 12.0–15.0)
Immature Granulocytes: 0 %
Lymphocytes Relative: 41 %
Lymphs Abs: 2 10*3/uL (ref 0.7–4.0)
MCH: 34.4 pg — ABNORMAL HIGH (ref 26.0–34.0)
MCHC: 35.3 g/dL (ref 30.0–36.0)
MCV: 97.4 fL (ref 80.0–100.0)
Monocytes Absolute: 0.5 10*3/uL (ref 0.1–1.0)
Monocytes Relative: 10 %
Neutro Abs: 2.3 10*3/uL (ref 1.7–7.7)
Neutrophils Relative %: 47 %
Platelets: 313 10*3/uL (ref 150–400)
RBC: 3.87 MIL/uL (ref 3.87–5.11)
RDW: 11.7 % (ref 11.5–15.5)
WBC: 4.9 10*3/uL (ref 4.0–10.5)
nRBC: 0 % (ref 0.0–0.2)

## 2020-04-28 LAB — URINALYSIS, ROUTINE W REFLEX MICROSCOPIC
Bilirubin Urine: NEGATIVE
Glucose, UA: NEGATIVE mg/dL
Hgb urine dipstick: NEGATIVE
Ketones, ur: 5 mg/dL — AB
Leukocytes,Ua: NEGATIVE
Nitrite: NEGATIVE
Protein, ur: NEGATIVE mg/dL
Specific Gravity, Urine: 1.008 (ref 1.005–1.030)
pH: 5 (ref 5.0–8.0)

## 2020-04-28 LAB — RESP PANEL BY RT-PCR (FLU A&B, COVID) ARPGX2
Influenza A by PCR: NEGATIVE
Influenza B by PCR: NEGATIVE
SARS Coronavirus 2 by RT PCR: NEGATIVE

## 2020-04-28 LAB — TSH: TSH: 0.419 u[IU]/mL (ref 0.350–4.500)

## 2020-04-28 LAB — ETHANOL: Alcohol, Ethyl (B): 223 mg/dL — ABNORMAL HIGH (ref <10)

## 2020-04-28 LAB — MAGNESIUM: Magnesium: 1.8 mg/dL (ref 1.7–2.4)

## 2020-04-28 MED ORDER — LEVOTHYROXINE SODIUM 88 MCG PO TABS: 88.0000 ug | ORAL_TABLET | Freq: Every day | ORAL | Status: DC

## 2020-04-28 MED ORDER — AMLODIPINE BESYLATE 5 MG PO TABS
5.0000 mg | ORAL_TABLET | Freq: Every day | ORAL | Status: DC
  Filled 2020-04-28: qty 1

## 2020-04-28 MED ORDER — THIAMINE HCL 100 MG PO TABS
100.0000 mg | ORAL_TABLET | Freq: Every day | ORAL | Status: DC
  Administered 2020-04-28: 100 mg via ORAL
  Filled 2020-04-28: qty 1

## 2020-04-28 MED ORDER — LORAZEPAM 1 MG PO TABS: 0.0000 mg | ORAL_TABLET | Freq: Two times a day (BID) | ORAL | Status: DC

## 2020-04-28 MED ORDER — LEVOTHYROXINE SODIUM 88 MCG PO TABS
88.0000 ug | ORAL_TABLET | Freq: Every day | ORAL | Status: DC
  Filled 2020-04-28: qty 1

## 2020-04-28 MED ORDER — LACTATED RINGERS IV BOLUS
1000.0000 mL | Freq: Once | INTRAVENOUS | Status: AC
  Administered 2020-04-28: 1000 mL via INTRAVENOUS

## 2020-04-28 MED ORDER — ASPIRIN 81 MG PO CHEW
81.0000 mg | CHEWABLE_TABLET | Freq: Every day | ORAL | Status: DC
  Administered 2020-04-28: 81 mg via ORAL
  Filled 2020-04-28: qty 1

## 2020-04-28 MED ORDER — LORAZEPAM 2 MG/ML IJ SOLN: 0.0000 mg | Freq: Four times a day (QID) | INTRAMUSCULAR | Status: DC

## 2020-04-28 MED ORDER — CALCIUM CARBONATE ANTACID 500 MG PO CHEW
1.0000 | CHEWABLE_TABLET | Freq: Once | ORAL | Status: AC
  Administered 2020-04-28: 200 mg via ORAL
  Filled 2020-04-28: qty 1

## 2020-04-28 MED ORDER — PANTOPRAZOLE SODIUM 40 MG PO TBEC
80.0000 mg | DELAYED_RELEASE_TABLET | Freq: Every day | ORAL | Status: DC
  Administered 2020-04-28: 80 mg via ORAL
  Filled 2020-04-28: qty 2

## 2020-04-28 MED ORDER — LORAZEPAM 2 MG/ML IJ SOLN: 0.0000 mg | Freq: Two times a day (BID) | INTRAMUSCULAR | Status: DC

## 2020-04-28 MED ORDER — OXYBUTYNIN CHLORIDE ER 5 MG PO TB24
5.0000 mg | ORAL_TABLET | Freq: Two times a day (BID) | ORAL | Status: DC
  Administered 2020-04-28: 5 mg via ORAL
  Filled 2020-04-28 (×2): qty 1

## 2020-04-28 MED ORDER — LORAZEPAM 1 MG PO TABS
0.0000 mg | ORAL_TABLET | Freq: Four times a day (QID) | ORAL | Status: DC
  Administered 2020-04-28: 1 mg via ORAL
  Filled 2020-04-28: qty 1

## 2020-04-28 MED ORDER — ADULT MULTIVITAMIN W/MINERALS CH
1.0000 | ORAL_TABLET | Freq: Every day | ORAL | Status: DC
  Administered 2020-04-28: 1 via ORAL
  Filled 2020-04-28: qty 1

## 2020-04-28 MED ORDER — THIAMINE HCL 100 MG/ML IJ SOLN: 100.0000 mg | Freq: Every day | INTRAMUSCULAR | Status: DC

## 2020-04-28 NOTE — ED Notes (Signed)
RN asked patient about her suicidal/wishing she were dead comments; patient reports she does not remember making those comments and that she "has too much to live for". Patient reports she was just at home last night and that she lives by herself and had had that bottle of alcohol for over a year and drank to excess. Patient reports she lost her husband approximately 6 years ago. Patient is cooperative at this time, but is anxious when speaking to this RN. Patient placed in purple scrub top.

## 2020-04-28 NOTE — ED Notes (Signed)
During triage patient keeps saying that she just wants to die.  EMS stated she told them the same.  When asked if she has thoughts of harming herself, she denies this.

## 2020-04-28 NOTE — ED Notes (Signed)
Phone call to son Clariza Sickman [phone] who was informed that his mother is being discharged tonight. Mr Lynam stated he would try to arrive within the hour.

## 2020-04-28 NOTE — ED Provider Notes (Signed)
Patient has been cleared by psychiatry.  The patient does not appear intoxicated at this time.  She is not psychotic and is not complaining of suicidal thoughts or intents.  At this point she appears stable for discharge.   Sherwood Gambler, MD 04/28/20 [phone]

## 2020-04-28 NOTE — BH Assessment (Signed)
Pt gave verbal authorization for collateral contact with her son, Lara Palinkas. By phone [name] denies concerns of pt intentionally harming herself. He reports she has a long hx of alcohol abuse and it got away with her again. [name] reports pt is usually a happy person. Pt denies SI, HI and AVH. She intends to not drink alcohol again. Son states he and his brothers are going to keep pt's car keys and she will not be able to buy alcohol anymore. They will assure she gets to shopping other ways. Letitia Libra, NP recommends psychiatric clearance.

## 2020-04-28 NOTE — ED Notes (Signed)
Patient now wanting to go home, wants to know how much longer she is going to be here.  Made patient aware that she has been medically cleared however due to her comments about wanting to die she now must be seen by TTS. Patient stated she did not mean it. Patient still very anxious even after PRN ativan given per CIWA scale.

## 2020-04-28 NOTE — ED Triage Notes (Signed)
Patient BIB [facility] from [place]  c/o tremors.  Patient has hx of ETOH abuse, afib, and some psych issues.  EMS placed 22g right hand. Patient A&OX4. Son is aware she is here, told EMS that she does this once a year, where she goes on a drinking binder.  Son spent all day yesterday cleaning out patient's apartment of ETOH but she had a bottle hidden away.  Vitals were 157/67 82-HR 22-RR  96% Room air 103 CBG  97.3-temp

## 2020-04-28 NOTE — ED Provider Notes (Signed)
[facility] DEPT Provider Note   CSN: [id number] Arrival date & time: 04/28/20  [id number]     History Chief Complaint  Patient presents with   Tremors   Alcohol Problem    Ellen Chandler is a 86 y.o. female.  HPI Patient reportedly brought in by EMS for tremors.  States she cannot stop her arm some shaking.  States she is shaking all over.  States however that she wants to die.  States that she wants to die because her husband died 7 years ago.  Reportedly also was a heavy drinker.  Patient states she does drink some wine but reportedly her son has been clearing out the alcohol from her house.  Thinks however that there was some hidden away.  Reportedly does have heavy drinking.  Patient had stated she last drank around 4 hours ago.  Denies wanting to hurt herself but states she does not want to die.  Reportedly per son does this around once a year.  Denies headache.  Denies confusion.    Past Medical History:  Diagnosis Date   Abnormal ECG    a. Pt aware of long hx of abnl ecg->h/o normal stress testing in [place] ~ 2000.   Diastolic dysfunction    a. 04/2013 Echo: EF 60-65%, No rwma, Gr 1 DD, mild MR.   GERD (gastroesophageal reflux disease)    Hypertension    Hypothyroidism     Patient Active Problem List   Diagnosis Date Noted   Chest pain 06/17/2016   UTI (urinary tract infection) 06/17/2016   Nonspecific abnormal electrocardiogram (ECG) (EKG) 05/06/2013   Acute bronchitis 05/06/2013   Essential hypertension [id number]   Acute diastolic CHF (congestive heart failure) ([place]) 05/06/2013   Dehydration 05/05/2013   CAP (community acquired pneumonia) 05/05/2013   Orthostatic hypotension 05/05/2013   Hyponatremia 05/05/2013    Past Surgical History:  Procedure Laterality Date   ABDOMINAL HYSTERECTOMY     JOINT REPLACEMENT     a. knee left - injury resulted after being attacked by a dog.     OB History   No  obstetric history on file.     Family History  Problem Relation Age of Onset   Heart attack Mother        died @ 54.   Diabetes type II Father        died @ 35.   Breast cancer Sister        died @ 42.    Social History   Tobacco Use   Smoking status: Never Smoker   Smokeless tobacco: Never Used  Substance Use Topics   Alcohol use: Yes   Drug use: No    Home Medications Prior to Admission medications   Medication Sig Start Date End Date Taking? Authorizing Provider  acetaminophen (TYLENOL) 500 MG tablet Take 1,000 mg by mouth every 6 (six) hours as needed for moderate pain.    [provider]  amLODipine (NORVASC) 5 MG tablet Take 5 mg by mouth daily. 03/20/16   [provider]  aspirin 81 MG tablet Take 81 mg by mouth daily.    [provider]  levothyroxine (SYNTHROID, LEVOTHROID) 88 MCG tablet Take 88 mcg by mouth daily before breakfast.    [provider]  Multiple Vitamin (MULTIVITAMIN WITH MINERALS) TABS tablet Take 1 tablet by mouth daily.    [provider]  omeprazole (PRILOSEC) 20 MG capsule Take 20 mg by mouth 2 (two) times daily before a  meal.     [provider]  oxybutynin (DITROPAN-XL) 5 MG 24 hr tablet Take 5 mg by mouth 2 (two) times daily.    [provider]    Allergies    Patient has no known allergies.  Review of Systems   Review of Systems  Constitutional: Negative for appetite change.  HENT: Negative for congestion.   Respiratory: Negative for shortness of breath.   Gastrointestinal: Negative for abdominal pain.  Genitourinary: Negative for flank pain.  Musculoskeletal: Negative for back pain.  Skin: Negative for rash.  Neurological: Positive for tremors.  Psychiatric/Behavioral: Positive for dysphoric mood.    Physical Exam Updated Vital Signs BP 123/61    Pulse 72    Temp 98.5 F (36.9 C) (Oral)    Resp 16    Ht 5\' 4"  (1.626 m)    Wt 68 kg    SpO2 93%    BMI 25.73  kg/m   Physical Exam Vitals and nursing note reviewed.  HENT:     Head: Atraumatic.     Mouth/Throat:     Mouth: Mucous membranes are moist.  Eyes:     General: No scleral icterus. Cardiovascular:     Rate and Rhythm: Normal rate.  Pulmonary:     Effort: Pulmonary effort is normal.  Abdominal:     Tenderness: There is no abdominal tenderness.  Musculoskeletal:        General: No tenderness.     Cervical back: Neck supple.  Skin:    General: Skin is warm.     Capillary Refill: Capillary refill takes less than 2 seconds.  Neurological:     Mental Status: She is alert.     Comments: Awake and appropriate.  However does have some shakiness and her hands, however it does stop when she goes to use them.  Also at rest will not have a tremor if she is relaxed or distracted.  Psychiatric:     Comments: Patient appears anxious and tearful.     ED Results / Procedures / Treatments   Labs (all labs ordered are listed, but only abnormal results are displayed) Labs Reviewed  COMPREHENSIVE METABOLIC PANEL - Abnormal; Notable for the following components:      Result Value   Sodium 129 (*)    Chloride 93 (*)    CO2 21 (*)    Calcium 8.6 (*)    All other components within normal limits  ETHANOL - Abnormal; Notable for the following components:   Alcohol, Ethyl (B) 223 (*)    All other components within normal limits  URINALYSIS, ROUTINE W REFLEX MICROSCOPIC - Abnormal; Notable for the following components:   Color, Urine STRAW (*)    Ketones, ur 5 (*)    All other components within normal limits  CBC WITH DIFFERENTIAL/PLATELET - Abnormal; Notable for the following components:   MCH 34.4 (*)    All other components within normal limits  RESP PANEL BY RT-PCR (FLU A&B, COVID) ARPGX2  RAPID URINE DRUG SCREEN, HOSP PERFORMED  MAGNESIUM  TSH    EKG EKG Interpretation  Date/Time:  Thursday April 28 2020 09:02:36 EST Ventricular Rate:  77 PR Interval:    QRS  Duration: 132 QT Interval:  395 QTC Calculation: 447 R Axis:   26 Text Interpretation: Sinus rhythm Multiple ventricular premature complexes Nonspecific intraventricular conduction delay Nonspecific T abnormalities, diffuse leads PVCs are new since last tracing Confirmed by Davonna Belling [phone]) on 04/28/2020 9:43:18 AM   Radiology No results  found.  Procedures Procedures (including critical care time)  Medications Ordered in ED Medications  LORazepam (ATIVAN) injection 0-4 mg (has no administration in time range)    Or  LORazepam (ATIVAN) tablet 0-4 mg (has no administration in time range)  LORazepam (ATIVAN) injection 0-4 mg (has no administration in time range)    Or  LORazepam (ATIVAN) tablet 0-4 mg (has no administration in time range)  thiamine tablet 100 mg (has no administration in time range)    Or  thiamine (B-1) injection 100 mg (has no administration in time range)  amLODipine (NORVASC) tablet 5 mg (has no administration in time range)  aspirin chewable tablet 81 mg (has no administration in time range)  oxybutynin (DITROPAN-XL) 24 hr tablet 5 mg (has no administration in time range)  multivitamin with minerals tablet 1 tablet (has no administration in time range)  pantoprazole (PROTONIX) EC tablet 80 mg (has no administration in time range)  levothyroxine (SYNTHROID) tablet 88 mcg (has no administration in time range)  lactated ringers bolus 1,000 mL (1,000 mLs Intravenous New Bag/Given 04/28/20 1103)    ED Course  I have reviewed the triage vital signs and the nursing notes.  Pertinent labs & imaging results that were available during my care of the patient were reviewed by me and considered in my medical decision making (see chart for details).    MDM Rules/Calculators/A&P                          Patient presented with reported tremors.  Reportedly has been drinking less although alcohol level is elevated here.  Patient states she does not want to be  around anymore.  Denies suicide attempt.  Lab work shows a mild hyponatremia.  Supplemented with IV fluids.  Similar to baseline however.  I think likely secondary to heavy alcohol use.  After fluids patient is medically cleared. I think her tremor is mostly psychologic.  It suppresses and is not consistent.  May be anxiety component. Patient is medically cleared.  With the statements of wanting to die will have patient seen by TTS.  Started on home medications and CIWA protocol.  Final Clinical Impression(s) / ED Diagnoses Final diagnoses:  Alcohol abuse  Depression, unspecified depression type  Hyponatremia    Rx / DC Orders ED Discharge Orders    None       Davonna Belling, MD 04/28/20 1257

## 2020-04-28 NOTE — BH Assessment (Addendum)
Comprehensive Clinical Assessment (CCA) Note  04/28/2020 Ellen Chandler [id number]  Visit Diagnosis: F10.1 Alcohol Abuse Disposition: Ellen Libra, NP recommends psychiatric clearance  Pt gave verbal authorization for collateral contact with her son, Ellen Chandler. By phone Ellen Chandler denies concerns of pt intentionally harming herself. He reports she has a long hx of alcohol abuse and it got away with her again. Ellen Chandler reports pt is usually a happy person. Pt denies SI, HI and AVH. She intends to not drink alcohol again. Pt is not interested in substance abuse tx or counseling. She states she knows what she need to do. Son states he and his brothers are going to keep pt's car keys and she will not be able to buy alcohol anymore. They will assure she gets to shopping other ways. Ellen Libra, NP recommends psychiatric clearance.  Chief Complaint:  Chief Complaint  Patient presents with  . Tremors  . Alcohol Problem   Visit Diagnosis: F10.1 Alcohol Abuse Disposition: Ellen Libra, NP recommends psychiatric clearance   CCA Screening, Triage and Referral (STR)  Patient Reported Information How did you hear about [place]? No data recorded Referral name: EDP  Whom do you see for routine medical problems? Primary Care  What Is the Reason for Your Visit/Call Today? "I drank too much alcohol"  How Long Has This Been Causing You Problems? > than 6 months  What Do You Feel Would Help You the Most Today? Assessment Only   Have You Recently Been in Any Inpatient Treatment (Hospital/Detox/Crisis Center/28-Day Program)? No   Have You Ever Received Services From [employer] [employer] Before? Yes  Have You Recently Had Any Thoughts About Hurting Yourself? No  Are You Planning to Commit Suicide/Harm Yourself At This time? No   Have you Recently Had Thoughts About [place]? No  Explanation: No data recorded  Have You Used Any Alcohol or Drugs in the Past 24 Hours? Yes  How Long Ago Did You Use Drugs or  Alcohol? 0600  What Did You Use and How Much? 1/4 bottle of whiskey   Do You Currently Have a Therapist/Psychiatrist? No   Have You Been Recently Discharged From Any Office Practice or Programs? No   CCA Screening Triage Referral Assessment Type of Contact: Tele-Assessment  Is this Initial or Reassessment? Initial Assessment  Date Telepsych consult ordered in [facility]:  04/28/2020  Time Telepsych consult ordered in [facility]:  1249   Collateral Involvement: son, Ellen Chandler- by phone   Patient Determined To Be At Risk for Harm To Self or Others Based on Review of Patient Reported Information or Presenting Complaint? No   Location of Assessment: [facility]   Does Patient Present under Involuntary Commitment? No  IVC Papers Initial File Date: No data recorded  [place] of Residence: [place]   Patient Currently Receiving the Following Services: No data recorded  Determination of Need: Urgent (48 hours)   CCA Biopsychosocial Intake/Chief Complaint:  alcohol abuse; told EDP she didn't want to live  Current Symptoms/Problems: alcohol abuse; denies SI   Patient Reported Schizophrenia/Schizoaffective Diagnosis in Past: No   Strengths: "usually a happy person: per son  Preferences: No data recorded Abilities: No data recorded  Type of Services Patient Feels are Needed: none   Mental Health Symptoms Depression:  None   Duration of Depressive symptoms: No data recorded  Mania:  N/A   Anxiety:   N/A   Psychosis:  None   Duration of Psychotic symptoms: No data recorded  Trauma:  N/A  Obsessions:  N/A   Compulsions:  N/A   Inattention:  N/A   Hyperactivity/Impulsivity:  N/A   Oppositional/Defiant Behaviors:  N/A   Emotional Irregularity:  N/A   Other Mood/Personality Symptoms:  No data recorded   Mental Status Exam Appearance and self-care  Stature:  Average   Weight:  Average weight   Clothing:  -- (lying in hospital bed in gown)   Grooming:  Normal    Cosmetic use:  None   Posture/gait:  Normal   Motor activity:  Not Remarkable   Sensorium  Attention:  Normal   Concentration:  Normal   Orientation:  X5   Recall/memory:  Normal   Affect and Mood  Affect:  Appropriate   Mood:  Dysphoric   Relating  Eye contact:  Normal   Facial expression:  Responsive   Attitude toward examiner:  Cooperative   Thought and Language  Speech flow: Clear and Coherent   Thought content:  Appropriate to Mood and Circumstances   Preoccupation:  None   Hallucinations:  None   Organization:  No data recorded  [employer] of Knowledge:  Average   Intelligence:  Average   Abstraction:  Normal   Judgement:  Normal   Reality Testing:  Realistic   Insight:  Gaps   Decision Making:  Normal   Social Functioning  Social Maturity:  Responsible   Social Judgement:  Normal   Stress  Stressors:  Other (Comment) (alcohol abuse)   Coping Ability:  Normal   Skill Deficits:  None   Supports:  Family; Friends/Service system     Leisure/Recreation: Leisure / Recreation Do You Have Hobbies?: Yes Leisure and Hobbies: Company secretary and cards  Exercise/Diet: Exercise/Diet Have You Gained or Lost A Significant Amount of Weight in the Past Six Months?: No Do You Have Any Trouble Sleeping?: No   CCA Employment/Education Employment/Work Situation: Employment / Work Situation Employment situation: Retired Has patient ever been in the [employer]?: No  Education: Education Is Patient Currently Attending School?: No   CCA Family/Childhood History Family and Relationship History: Family history Marital status: Widowed Widowed, when?: 7 years ago Does patient have children?: Yes How many children?: 4 How is patient's relationship with their children?: close with her 5 sons   CCA Substance Use Alcohol/Drug Use: Alcohol / Drug Use Pain Medications: See MAR Prescriptions: See MAR Over the Counter: See MAR History  of alcohol / drug use?: Yes Longest period of sobriety (when/how long): 5 years Negative Consequences of Use: Personal relationships Withdrawal Symptoms: Tremors Substance #1 Name of Substance 1: alcohol 1 - Amount (size/oz): varies 1 - Frequency: 1-2 x weekly 1 - Last Use / Amount: 04/28/20     ASAM's:  Six Dimensions of Multidimensional Assessment  Dimension 1:  Acute Intoxication and/or Withdrawal Potential:      Dimension 2:  Biomedical Conditions and Complications:      Dimension 3:  Emotional, Behavioral, or Cognitive Conditions and Complications:     Dimension 4:  Readiness to Change:     Dimension 5:  Relapse, Continued use, or Continued Problem Potential:     Dimension 6:  Recovery/Living Environment:     ASAM Severity Score:    ASAM Recommended Level of Treatment: ASAM Recommended Level of Treatment: Level I Outpatient Treatment    DSM5 Diagnoses: Patient Active Problem List   Diagnosis Date Noted  . Chest pain 06/17/2016  . UTI (urinary tract infection) 06/17/2016  . Nonspecific abnormal electrocardiogram (ECG) (EKG) 05/06/2013  .  Acute bronchitis 05/06/2013  . Essential hypertension 05/06/2013  . Acute diastolic CHF (congestive heart failure) ([place]) 05/06/2013  . Dehydration 05/05/2013  . CAP (community acquired pneumonia) 05/05/2013  . Orthostatic hypotension 05/05/2013  . Hyponatremia 05/05/2013   Disposition: Ellen Libra, NP recommends psychiatric clearance  Ellen Chandler Tora Perches, LCSW

## 2020-06-06 DIAGNOSIS — H2512 Age-related nuclear cataract, left eye: Secondary | ICD-10-CM | POA: Diagnosis not present

## 2020-06-06 DIAGNOSIS — H2511 Age-related nuclear cataract, right eye: Secondary | ICD-10-CM | POA: Diagnosis not present

## 2020-06-07 DIAGNOSIS — H2512 Age-related nuclear cataract, left eye: Secondary | ICD-10-CM | POA: Diagnosis not present

## 2020-06-07 DIAGNOSIS — H25042 Posterior subcapsular polar age-related cataract, left eye: Secondary | ICD-10-CM | POA: Diagnosis not present

## 2020-06-07 DIAGNOSIS — H25012 Cortical age-related cataract, left eye: Secondary | ICD-10-CM | POA: Diagnosis not present

## 2020-06-27 DIAGNOSIS — H2512 Age-related nuclear cataract, left eye: Secondary | ICD-10-CM | POA: Diagnosis not present

## 2020-12-06 DIAGNOSIS — I10 Essential (primary) hypertension: Secondary | ICD-10-CM | POA: Diagnosis not present

## 2020-12-06 DIAGNOSIS — N182 Chronic kidney disease, stage 2 (mild): Secondary | ICD-10-CM | POA: Diagnosis not present

## 2020-12-06 DIAGNOSIS — E785 Hyperlipidemia, unspecified: Secondary | ICD-10-CM | POA: Diagnosis not present

## 2020-12-09 DIAGNOSIS — I10 Essential (primary) hypertension: Secondary | ICD-10-CM | POA: Diagnosis not present

## 2020-12-09 DIAGNOSIS — Z791 Long term (current) use of non-steroidal anti-inflammatories (NSAID): Secondary | ICD-10-CM | POA: Diagnosis not present

## 2020-12-09 DIAGNOSIS — R350 Frequency of micturition: Secondary | ICD-10-CM | POA: Diagnosis not present

## 2020-12-09 DIAGNOSIS — E039 Hypothyroidism, unspecified: Secondary | ICD-10-CM | POA: Diagnosis not present

## 2020-12-09 DIAGNOSIS — Z0000 Encounter for general adult medical examination without abnormal findings: Secondary | ICD-10-CM | POA: Diagnosis not present

## 2020-12-09 DIAGNOSIS — M8580 Other specified disorders of bone density and structure, unspecified site: Secondary | ICD-10-CM | POA: Diagnosis not present

## 2020-12-09 DIAGNOSIS — D692 Other nonthrombocytopenic purpura: Secondary | ICD-10-CM | POA: Diagnosis not present

## 2020-12-09 DIAGNOSIS — E871 Hypo-osmolality and hyponatremia: Secondary | ICD-10-CM | POA: Diagnosis not present

## 2020-12-09 DIAGNOSIS — M179 Osteoarthritis of knee, unspecified: Secondary | ICD-10-CM | POA: Diagnosis not present

## 2020-12-09 DIAGNOSIS — I129 Hypertensive chronic kidney disease with stage 1 through stage 4 chronic kidney disease, or unspecified chronic kidney disease: Secondary | ICD-10-CM | POA: Diagnosis not present

## 2020-12-09 DIAGNOSIS — I5032 Chronic diastolic (congestive) heart failure: Secondary | ICD-10-CM | POA: Diagnosis not present

## 2020-12-09 DIAGNOSIS — M810 Age-related osteoporosis without current pathological fracture: Secondary | ICD-10-CM | POA: Diagnosis not present

## 2021-02-14 ENCOUNTER — Emergency Department (HOSPITAL_COMMUNITY): Payer: PPO

## 2021-02-14 ENCOUNTER — Encounter (HOSPITAL_COMMUNITY): Payer: Self-pay | Admitting: Radiology

## 2021-02-14 ENCOUNTER — Emergency Department (HOSPITAL_COMMUNITY)
Admission: EM | Admit: 2021-02-14 | Discharge: 2021-02-15 | Disposition: A | Discharge status: Home / Self Care | Payer: PPO | Attending: Emergency Medicine | Admitting: Emergency Medicine

## 2021-02-14 DIAGNOSIS — R519 Headache, unspecified: Secondary | ICD-10-CM | POA: Diagnosis not present

## 2021-02-14 DIAGNOSIS — S199XXA Unspecified injury of neck, initial encounter: Secondary | ICD-10-CM | POA: Insufficient documentation

## 2021-02-14 DIAGNOSIS — M4312 Spondylolisthesis, cervical region: Secondary | ICD-10-CM | POA: Diagnosis not present

## 2021-02-14 DIAGNOSIS — W06XXXA Fall from bed, initial encounter: Secondary | ICD-10-CM | POA: Diagnosis not present

## 2021-02-14 DIAGNOSIS — R0902 Hypoxemia: Secondary | ICD-10-CM | POA: Diagnosis not present

## 2021-02-14 DIAGNOSIS — I10 Essential (primary) hypertension: Secondary | ICD-10-CM | POA: Insufficient documentation

## 2021-02-14 DIAGNOSIS — F10929 Alcohol use, unspecified with intoxication, unspecified: Secondary | ICD-10-CM | POA: Diagnosis not present

## 2021-02-14 DIAGNOSIS — F10920 Alcohol use, unspecified with intoxication, uncomplicated: Secondary | ICD-10-CM

## 2021-02-14 DIAGNOSIS — Z7982 Long term (current) use of aspirin: Secondary | ICD-10-CM | POA: Insufficient documentation

## 2021-02-14 DIAGNOSIS — E871 Hypo-osmolality and hyponatremia: Secondary | ICD-10-CM

## 2021-02-14 DIAGNOSIS — F1099 Alcohol use, unspecified with unspecified alcohol-induced disorder: Secondary | ICD-10-CM | POA: Insufficient documentation

## 2021-02-14 DIAGNOSIS — M50322 Other cervical disc degeneration at C5-C6 level: Secondary | ICD-10-CM | POA: Insufficient documentation

## 2021-02-14 DIAGNOSIS — E039 Hypothyroidism, unspecified: Secondary | ICD-10-CM | POA: Diagnosis not present

## 2021-02-14 DIAGNOSIS — Z79899 Other long term (current) drug therapy: Secondary | ICD-10-CM | POA: Insufficient documentation

## 2021-02-14 DIAGNOSIS — R9082 White matter disease, unspecified: Secondary | ICD-10-CM | POA: Diagnosis not present

## 2021-02-14 DIAGNOSIS — I491 Atrial premature depolarization: Secondary | ICD-10-CM | POA: Diagnosis not present

## 2021-02-14 LAB — URINALYSIS, ROUTINE W REFLEX MICROSCOPIC
Bacteria, UA: NONE SEEN
Bilirubin Urine: NEGATIVE
Glucose, UA: NEGATIVE mg/dL
Ketones, ur: NEGATIVE mg/dL
Leukocytes,Ua: NEGATIVE
Nitrite: NEGATIVE
Protein, ur: NEGATIVE mg/dL
Specific Gravity, Urine: 1.011 (ref 1.005–1.030)
pH: 5 (ref 5.0–8.0)

## 2021-02-14 LAB — COMPREHENSIVE METABOLIC PANEL
ALT: 21 U/L (ref 0–44)
AST: 24 U/L (ref 15–41)
Albumin: 4.2 g/dL (ref 3.5–5.0)
Alkaline Phosphatase: 48 U/L (ref 38–126)
Anion gap: 15 (ref 5–15)
BUN: 18 mg/dL (ref 8–23)
CO2: 21 mmol/L — ABNORMAL LOW (ref 22–32)
Calcium: 9 mg/dL (ref 8.9–10.3)
Chloride: 95 mmol/L — ABNORMAL LOW (ref 98–111)
Creatinine, Ser: 0.49 mg/dL (ref 0.44–1.00)
GFR, Estimated: >60 mL/min (ref >60)
Glucose, Bld: 75 mg/dL (ref 70–99)
Potassium: 4.3 mmol/L (ref 3.5–5.1)
Sodium: 131 mmol/L — ABNORMAL LOW (ref 135–145)
Total Bilirubin: 0.6 mg/dL (ref 0.3–1.2)
Total Protein: 6.8 g/dL (ref 6.5–8.1)

## 2021-02-14 LAB — CK: Total CK: 58 U/L (ref 38–234)

## 2021-02-14 LAB — ETHANOL: Alcohol, Ethyl (B): 146 mg/dL — ABNORMAL HIGH (ref <10)

## 2021-02-14 LAB — LIPASE, BLOOD: Lipase: 23 U/L (ref 11–51)

## 2021-02-14 MED ORDER — THIAMINE HCL 100 MG PO TABS: 250.0000 mg | ORAL_TABLET | Freq: Once | ORAL | Status: DC

## 2021-02-14 NOTE — ED Notes (Signed)
[facility] called for transport.  

## 2021-02-14 NOTE — ED Provider Notes (Signed)
[facility] DEPT Provider Note   CSN: [id number] Arrival date & time: 02/14/21  1235     History No chief complaint on file.   Ellen Chandler is a 87 y.o. female who arrives via EMS from [place].  Patient has a past history of alcohol use disorder.  Apparently she has drunk 2 L of wine since yesterday.  Staff believes she slid out of bed at some point and they found her on the ground.  Patient states that "I do not know what happened I just found myself on the ground."  She is complaining of some neck pain but denies any weakness in her extremities.  She denies any pain elsewhere.  HPI     Past Medical History:  Diagnosis Date   Abnormal ECG    a. Pt aware of long hx of abnl ecg->h/o normal stress testing in [place] ~ 2000.   Diastolic dysfunction    a. 04/2013 Echo: EF 60-65%, No rwma, Gr 1 DD, mild MR.   GERD (gastroesophageal reflux disease)    Hypertension    Hypothyroidism     Patient Active Problem List   Diagnosis Date Noted   Chest pain 06/17/2016   UTI (urinary tract infection) 06/17/2016   Nonspecific abnormal electrocardiogram (ECG) (EKG) 05/06/2013   Acute bronchitis 05/06/2013   Essential hypertension 66/29/4765   Acute diastolic CHF (congestive heart failure) ([place]) 05/06/2013   Dehydration 05/05/2013   CAP (community acquired pneumonia) 05/05/2013   Orthostatic hypotension 05/05/2013   Hyponatremia 05/05/2013    Past Surgical History:  Procedure Laterality Date   ABDOMINAL HYSTERECTOMY     JOINT REPLACEMENT     a. knee left - injury resulted after being attacked by a dog.     OB History   No obstetric history on file.     Family History  Problem Relation Age of Onset   Heart attack Mother        died @ 24.   Diabetes type II Father        died @ 43.   Breast cancer Sister        died @ 82.    Social History   Tobacco Use   Smoking status: Never   Smokeless tobacco: Never  Substance Use  Topics   Alcohol use: Yes   Drug use: No    Home Medications Prior to Admission medications   Medication Sig Start Date End Date Taking? Authorizing Provider  acetaminophen (TYLENOL) 500 MG tablet Take 1,000 mg by mouth every 6 (six) hours as needed for moderate pain.    [provider]  amLODipine (NORVASC) 5 MG tablet Take 5 mg by mouth daily. 03/20/16   [provider]  aspirin 81 MG tablet Take 81 mg by mouth daily.    [provider]  levothyroxine (SYNTHROID, LEVOTHROID) 88 MCG tablet Take 88 mcg by mouth daily before breakfast.    [provider]  Multiple Vitamin (MULTIVITAMIN WITH MINERALS) TABS tablet Take 1 tablet by mouth daily.    [provider]  omeprazole (PRILOSEC) 20 MG capsule Take 20 mg by mouth 2 (two) times daily before a meal.     [provider]  oxybutynin (DITROPAN-XL) 5 MG 24 hr tablet Take 5 mg by mouth 2 (two) times daily.    [provider]    Allergies    Patient has no known allergies.  Review of Systems   Review of Systems Ten systems reviewed and are  negative for acute change, except as noted in the HPI.   Physical Exam Updated Vital Signs BP 96/65 (BP Location: Left Arm)   Pulse (!) 120   Temp 97.7 F (36.5 C) (Oral)   Resp 16   SpO2 91%   Physical Exam Vitals and nursing note reviewed.  Constitutional:      General: She is not in acute distress.    Appearance: She is well-developed. She is not diaphoretic.  HENT:     Head: Normocephalic and atraumatic.     Right Ear: External ear normal.     Left Ear: External ear normal.     Nose: Nose normal.     Mouth/Throat:     Mouth: Mucous membranes are moist.  Eyes:     General: No scleral icterus.    Conjunctiva/sclera: Conjunctivae normal.  Neck:     Comments: Midline spinal tenderness, pain improved when given a pillow to support her neck. Cardiovascular:     Rate and Rhythm: Normal rate and regular rhythm.     Heart  sounds: Normal heart sounds. No murmur heard.   No friction rub. No gallop.  Pulmonary:     Effort: Pulmonary effort is normal. No respiratory distress.     Breath sounds: Normal breath sounds.  Abdominal:     General: Bowel sounds are normal. There is no distension.     Palpations: Abdomen is soft. There is no mass.     Tenderness: There is no abdominal tenderness. There is no guarding.  Skin:    General: Skin is warm and dry.  Neurological:     Mental Status: She is alert and oriented to person, place, and time.  Psychiatric:        Behavior: Behavior normal.    ED Results / Procedures / Treatments   Labs (all labs ordered are listed, but only abnormal results are displayed) Labs Reviewed  COMPREHENSIVE METABOLIC PANEL  LIPASE, BLOOD  ETHANOL  CK  URINALYSIS, ROUTINE W REFLEX MICROSCOPIC    EKG None  Radiology No results found.  Procedures Procedures   Medications Ordered in ED Medications - No data to display  ED Course  I have reviewed the triage vital signs and the nursing notes.  Pertinent labs & imaging results that were available during my care of the patient were reviewed by me and considered in my medical decision making (see chart for details).    MDM Rules/Calculators/A&P                          87 year old female here with history of alcohol use disorder.  Found down at her care facility.  Known to have ingested alcohol.  I have ordered labs and imaging.  Patient's vital signs initially abnormal with tachycardia, soft blood pressures and low oxygen saturations.  I have ordered cardiac and oxygen monitoring.  Patient case signed out to [facility] who will assume care of the patient for appropriate disposition and follow-up. Final Clinical Impression(s) / ED Diagnoses Final diagnoses:  None    Rx / DC Orders ED Discharge Orders     None        Margarita Mail, PA-C 02/14/21 1544    Fredia Sorrow, MD 02/16/21 [phone]

## 2021-02-14 NOTE — ED Provider Notes (Signed)
Patient seen by me along with physician assistant.  I provided a substantive portion of the care of this patient.  I personally performed the entirety of the history, exam, and medical decision making for this encounter.    Patient sent in from [place].  States that she had drank 2 L of wine since yesterday slid out of the bed at some point only complaining of not being sure how long she had been on the floor.  Patient was some abnormal vital signs here temp was 97.7.  Pulse was 120.  Blood pressure on soft side at 96/65.  Oxygen saturations 91%.  Patient is alert and will answer questions.  Complains of being cold.  Patient was evaluated while still in the hallway out in triage.  Patient will require cardiac monitoring.  Patient does need EKG blood work.  Sepsis not completely ruled out.  Alcohol level as well.        Fredia Sorrow, MD 02/14/21 [phone]

## 2021-02-14 NOTE — ED Provider Notes (Signed)
Accepted handoff at shift change from [facility], PA-C. Please see prior provider note for more detail.   Briefly: Patient is 87 y.o.   DDX:  intoxication  Plan: Labs, CT imaging, pending discharge     Physical Exam  BP 96/65 (BP Location: Left Arm)   Pulse (!) 120   Temp 97.7 F (36.5 C) (Oral)   Resp 16   SpO2 91%   Physical Exam  ED Course/Procedures     Procedures  MDM   CT head and cervical spine back shows moderate degenerative disc disease is noted at C5-6, but no acute intracranial abnormality seen.  Urinalysis shows small blood, otherwise normal.  CMP shows mild hyponatremia at 131 which is consistent with patient's previous labs.  CK and lipase normal.  Ethanol at 146.  At this time, patient does not appear acutely intoxicated.  She is eating and drinking with ease.  Speaking and responding to questions appropriately.  Was able to ambulate to the bathroom with assistance of ER Tech.  Patient ambulates with a walker at home at baseline.  Patient is nondiaphoretic, no concern for DTs. The patient is requesting to be home.  She has no complaints at this time. She denies any SI or HI. Denies any depression or anxiety.  Discussed return precautions with patient.  Patient agrees with plan.  Patient is stable and being discharged home in good condition.       Sherrell Puller, PA-C 02/14/21 2249    Dorie Rank, MD 02/15/21 [phone]

## 2021-02-14 NOTE — Discharge Instructions (Addendum)
You were seen here today for alcohol intoxication.  It is dangerous to keep drinking at this level.  Please follow-up with your primary care practice if you are interested in cessation of drinking.  Your head and neck imaging came back benign.  Your labs showed slightly decreased sodium, which is normal compared to your previous labs.    If you have any tremors, increased shakiness, hallucinations, seizures, please return to the nearest emergency department.  If you have any concern, new or worsening symptoms, please return to the nearest emergency department as well.

## 2021-02-14 NOTE — ED Triage Notes (Signed)
Per EMS, patient is from the [facility]-has drank 2L of wine since yesterday-slid out of bed at some point-only complaint is being cold-not sure how long she has been on the floor-staff states she doe this all the time

## 2021-03-16 DIAGNOSIS — M810 Age-related osteoporosis without current pathological fracture: Secondary | ICD-10-CM | POA: Diagnosis not present

## 2021-03-16 DIAGNOSIS — E785 Hyperlipidemia, unspecified: Secondary | ICD-10-CM | POA: Diagnosis not present

## 2021-03-16 DIAGNOSIS — I129 Hypertensive chronic kidney disease with stage 1 through stage 4 chronic kidney disease, or unspecified chronic kidney disease: Secondary | ICD-10-CM | POA: Diagnosis not present

## 2021-03-16 DIAGNOSIS — Z791 Long term (current) use of non-steroidal anti-inflammatories (NSAID): Secondary | ICD-10-CM | POA: Diagnosis not present

## 2021-03-16 DIAGNOSIS — R350 Frequency of micturition: Secondary | ICD-10-CM | POA: Diagnosis not present

## 2021-03-16 DIAGNOSIS — E871 Hypo-osmolality and hyponatremia: Secondary | ICD-10-CM | POA: Diagnosis not present

## 2021-03-16 DIAGNOSIS — Z23 Encounter for immunization: Secondary | ICD-10-CM | POA: Diagnosis not present

## 2021-03-16 DIAGNOSIS — E039 Hypothyroidism, unspecified: Secondary | ICD-10-CM | POA: Diagnosis not present

## 2021-06-14 DIAGNOSIS — I10 Essential (primary) hypertension: Secondary | ICD-10-CM | POA: Diagnosis not present

## 2021-06-14 DIAGNOSIS — E559 Vitamin D deficiency, unspecified: Secondary | ICD-10-CM | POA: Diagnosis not present

## 2021-06-14 DIAGNOSIS — I129 Hypertensive chronic kidney disease with stage 1 through stage 4 chronic kidney disease, or unspecified chronic kidney disease: Secondary | ICD-10-CM | POA: Diagnosis not present

## 2021-06-17 ENCOUNTER — Emergency Department (HOSPITAL_COMMUNITY): Payer: PPO

## 2021-06-17 ENCOUNTER — Other Ambulatory Visit: Payer: Self-pay

## 2021-06-17 ENCOUNTER — Encounter (HOSPITAL_COMMUNITY): Payer: Self-pay

## 2021-06-17 ENCOUNTER — Emergency Department (HOSPITAL_COMMUNITY)
Admission: EM | Admit: 2021-06-17 | Discharge: 2021-06-17 | Disposition: A | Discharge status: Home / Self Care | Payer: PPO | Attending: Emergency Medicine | Admitting: Emergency Medicine

## 2021-06-17 DIAGNOSIS — R4020 Unspecified coma: Secondary | ICD-10-CM | POA: Diagnosis not present

## 2021-06-17 DIAGNOSIS — Z043 Encounter for examination and observation following other accident: Secondary | ICD-10-CM | POA: Diagnosis not present

## 2021-06-17 DIAGNOSIS — F10920 Alcohol use, unspecified with intoxication, uncomplicated: Secondary | ICD-10-CM

## 2021-06-17 DIAGNOSIS — E039 Hypothyroidism, unspecified: Secondary | ICD-10-CM | POA: Diagnosis not present

## 2021-06-17 DIAGNOSIS — Z7982 Long term (current) use of aspirin: Secondary | ICD-10-CM | POA: Diagnosis not present

## 2021-06-17 DIAGNOSIS — R102 Pelvic and perineal pain: Secondary | ICD-10-CM | POA: Diagnosis not present

## 2021-06-17 DIAGNOSIS — F10929 Alcohol use, unspecified with intoxication, unspecified: Secondary | ICD-10-CM | POA: Diagnosis not present

## 2021-06-17 DIAGNOSIS — I6381 Other cerebral infarction due to occlusion or stenosis of small artery: Secondary | ICD-10-CM | POA: Diagnosis not present

## 2021-06-17 DIAGNOSIS — M545 Low back pain, unspecified: Secondary | ICD-10-CM | POA: Diagnosis not present

## 2021-06-17 DIAGNOSIS — E119 Type 2 diabetes mellitus without complications: Secondary | ICD-10-CM | POA: Insufficient documentation

## 2021-06-17 DIAGNOSIS — F10129 Alcohol abuse with intoxication, unspecified: Secondary | ICD-10-CM | POA: Diagnosis not present

## 2021-06-17 DIAGNOSIS — M542 Cervicalgia: Secondary | ICD-10-CM | POA: Diagnosis not present

## 2021-06-17 DIAGNOSIS — M4316 Spondylolisthesis, lumbar region: Secondary | ICD-10-CM | POA: Diagnosis not present

## 2021-06-17 DIAGNOSIS — S0083XA Contusion of other part of head, initial encounter: Secondary | ICD-10-CM

## 2021-06-17 DIAGNOSIS — Y906 Blood alcohol level of 120-199 mg/100 ml: Secondary | ICD-10-CM | POA: Diagnosis not present

## 2021-06-17 DIAGNOSIS — W19XXXA Unspecified fall, initial encounter: Secondary | ICD-10-CM | POA: Diagnosis not present

## 2021-06-17 DIAGNOSIS — Z79899 Other long term (current) drug therapy: Secondary | ICD-10-CM | POA: Insufficient documentation

## 2021-06-17 DIAGNOSIS — K449 Diaphragmatic hernia without obstruction or gangrene: Secondary | ICD-10-CM | POA: Diagnosis not present

## 2021-06-17 DIAGNOSIS — S32020A Wedge compression fracture of second lumbar vertebra, initial encounter for closed fracture: Secondary | ICD-10-CM | POA: Diagnosis not present

## 2021-06-17 DIAGNOSIS — R079 Chest pain, unspecified: Secondary | ICD-10-CM | POA: Diagnosis not present

## 2021-06-17 DIAGNOSIS — I10 Essential (primary) hypertension: Secondary | ICD-10-CM | POA: Insufficient documentation

## 2021-06-17 DIAGNOSIS — S0993XA Unspecified injury of face, initial encounter: Secondary | ICD-10-CM | POA: Diagnosis present

## 2021-06-17 DIAGNOSIS — S4991XA Unspecified injury of right shoulder and upper arm, initial encounter: Secondary | ICD-10-CM | POA: Diagnosis not present

## 2021-06-17 LAB — CBC WITH DIFFERENTIAL/PLATELET
Abs Immature Granulocytes: 0.07 10*3/uL (ref 0.00–0.07)
Basophils Absolute: 0 10*3/uL (ref 0.0–0.1)
Basophils Relative: 1 %
Eosinophils Absolute: 0.1 10*3/uL (ref 0.0–0.5)
Eosinophils Relative: 1 %
HCT: 36.3 % (ref 36.0–46.0)
Hemoglobin: 12.8 g/dL (ref 12.0–15.0)
Immature Granulocytes: 1 %
Lymphocytes Relative: 22 %
Lymphs Abs: 1.5 10*3/uL (ref 0.7–4.0)
MCH: 34.2 pg — ABNORMAL HIGH (ref 26.0–34.0)
MCHC: 35.3 g/dL (ref 30.0–36.0)
MCV: 97.1 fL (ref 80.0–100.0)
Monocytes Absolute: 0.6 10*3/uL (ref 0.1–1.0)
Monocytes Relative: 9 %
Neutro Abs: 4.5 10*3/uL (ref 1.7–7.7)
Neutrophils Relative %: 66 %
Platelets: 324 10*3/uL (ref 150–400)
RBC: 3.74 MIL/uL — ABNORMAL LOW (ref 3.87–5.11)
RDW: 12 % (ref 11.5–15.5)
WBC: 6.8 10*3/uL (ref 4.0–10.5)
nRBC: 0 % (ref 0.0–0.2)

## 2021-06-17 LAB — COMPREHENSIVE METABOLIC PANEL
ALT: 21 U/L (ref 0–44)
AST: 28 U/L (ref 15–41)
Albumin: 3.9 g/dL (ref 3.5–5.0)
Alkaline Phosphatase: 50 U/L (ref 38–126)
Anion gap: 11 (ref 5–15)
BUN: 15 mg/dL (ref 8–23)
CO2: 20 mmol/L — ABNORMAL LOW (ref 22–32)
Calcium: 8.9 mg/dL (ref 8.9–10.3)
Chloride: 100 mmol/L (ref 98–111)
Creatinine, Ser: 0.7 mg/dL (ref 0.44–1.00)
GFR, Estimated: >60 mL/min (ref >60)
Glucose, Bld: 84 mg/dL (ref 70–99)
Potassium: 4 mmol/L (ref 3.5–5.1)
Sodium: 131 mmol/L — ABNORMAL LOW (ref 135–145)
Total Bilirubin: 0.4 mg/dL (ref 0.3–1.2)
Total Protein: 6.6 g/dL (ref 6.5–8.1)

## 2021-06-17 LAB — ETHANOL: Alcohol, Ethyl (B): 164 mg/dL — ABNORMAL HIGH (ref <10)

## 2021-06-17 MED ORDER — HYDROCODONE-ACETAMINOPHEN 5-325 MG PO TABS: 1.0000 | ORAL_TABLET | Freq: Once | ORAL | Status: DC

## 2021-06-17 MED ORDER — ACETAMINOPHEN 325 MG PO TABS
650.0000 mg | ORAL_TABLET | Freq: Once | ORAL | Status: AC
  Administered 2021-06-17: 650 mg via ORAL
  Filled 2021-06-17: qty 2

## 2021-06-17 NOTE — ED Triage Notes (Signed)
Patient brought in via ems from assisted living home. Patient states she fell this morning and was found on the ground next to a broken vase by staff. Patient states she does not remember the fall or what caused it. C/o neck and low back pain with abrasions noted to chin area

## 2021-06-17 NOTE — ED Notes (Signed)
C-collar removed by MD at bedside

## 2021-06-17 NOTE — ED Provider Notes (Addendum)
[facility] DEPT Provider Note   CSN: [id number] Arrival date & time: 06/17/21  1049     History  Chief Complaint  Patient presents with   Ellen Chandler is a 88 y.o. female.  Pt is a 88 yo wf with a hx of htn, hypothyroidism, gerd, and dm.  She lives at assisted living.  She had an unwitnessed fall this am and was found on the ground next to a broken vase.  She does not recall how she fell.  Pt has pain all over.  EMS reports multiple empty alcohol bottles.  She said she had some wine this morning.      Home Medications Prior to Admission medications   Medication Sig Start Date End Date Taking? Authorizing Provider  acetaminophen (TYLENOL) 500 MG tablet Take 1,000 mg by mouth every 6 (six) hours as needed for moderate pain.    [provider]  amLODipine (NORVASC) 5 MG tablet Take 5 mg by mouth daily. 03/20/16   [provider]  aspirin 81 MG tablet Take 81 mg by mouth daily.    [provider]  levothyroxine (SYNTHROID, LEVOTHROID) 88 MCG tablet Take 88 mcg by mouth daily before breakfast.    [provider]  Multiple Vitamin (MULTIVITAMIN WITH MINERALS) TABS tablet Take 1 tablet by mouth daily.    [provider]  omeprazole (PRILOSEC) 20 MG capsule Take 20 mg by mouth 2 (two) times daily before a meal.     [provider]  oxybutynin (DITROPAN-XL) 5 MG 24 hr tablet Take 5 mg by mouth 2 (two) times daily.    [provider]      Allergies    Patient has no known allergies.    Review of Systems   Review of Systems  HENT:         Facial pain  Musculoskeletal:  Positive for back pain and neck pain.  All other systems reviewed and are negative.  Physical Exam Updated Vital Signs BP (!) 123/100    Pulse 75    Temp 97.6 F (36.4 C)    Resp 16    Ht 5\' 4"  (1.626 m)    Wt 61.2 kg    SpO2 96%    BMI 23.17 kg/m  Physical Exam Vitals and nursing note reviewed.   Constitutional:      Appearance: Normal appearance.  HENT:     Head: Normocephalic.     Comments: Bruising to chin    Right Ear: External ear normal.     Left Ear: External ear normal.     Nose: Nose normal.     Mouth/Throat:     Mouth: Mucous membranes are moist.     Pharynx: Oropharynx is clear.  Eyes:     Extraocular Movements: Extraocular movements intact.     Conjunctiva/sclera: Conjunctivae normal.     Pupils: Pupils are equal, round, and reactive to light.  Cardiovascular:     Rate and Rhythm: Normal rate and regular rhythm.     Pulses: Normal pulses.     Heart sounds: Normal heart sounds.  Pulmonary:     Effort: Pulmonary effort is normal.     Breath sounds: Normal breath sounds.  Abdominal:     General: Abdomen is flat. Bowel sounds are normal.     Palpations: Abdomen is soft.  Musculoskeletal:        General: Normal range of motion.     Cervical back: Normal range  of motion and neck supple.  Skin:    General: Skin is warm.     Capillary Refill: Capillary refill takes less than 2 seconds.  Neurological:     General: No focal deficit present.     Mental Status: She is alert and oriented to person, place, and time.  Psychiatric:        Mood and Affect: Mood normal.        Behavior: Behavior normal.        Thought Content: Thought content normal.        Judgment: Judgment normal.    ED Results / Procedures / Treatments   Labs (all labs ordered are listed, but only abnormal results are displayed) Labs Reviewed  COMPREHENSIVE METABOLIC PANEL - Abnormal; Notable for the following components:      Result Value   Sodium 131 (*)    CO2 20 (*)    All other components within normal limits  CBC WITH DIFFERENTIAL/PLATELET - Abnormal; Notable for the following components:   RBC 3.74 (*)    MCH 34.2 (*)    All other components within normal limits  ETHANOL - Abnormal; Notable for the following components:   Alcohol, Ethyl (B) 164 (*)    All other components within  normal limits  URINALYSIS, ROUTINE W REFLEX MICROSCOPIC    EKG None  Radiology DG Chest 2 View  Result Date: 06/17/2021 CLINICAL DATA:  Patient brought in via ems from assisted living home. Patient states she fell this morning and was found on the ground next to a broken vase by staff. Patient states she does not remember the fall or what caused it. C/o neck and low back pain with abrasions noted to chin area . Hx of HTN and diastolic dysfunction EXAM: CHEST - 2 VIEW COMPARISON:  06/17/2016 at 5:19 p.m. FINDINGS: Cardiac silhouette is mildly enlarged. Large hiatal hernia. No mediastinal or hilar masses or evidence of adenopathy. Clear lungs.  No pleural effusion or pneumothorax. Midthoracic vertebral compression fracture stable from 2018. Mild compression fracture of L2 stable from an abdomen and pelvis CT from 09/20/2017. No acute fracture. IMPRESSION: 1. No acute cardiopulmonary disease. 2. Large hiatal hernia. 3. Two chronic vertebral fractures. Electronically Signed   By: Lajean Manes M.D.   On: 06/17/2021 12:26   DG Lumbar Spine Complete  Result Date: 06/17/2021 CLINICAL DATA:  Patient brought in via ems from assisted living home. Patient states she fell this morning and was found on the ground next to a broken vase by staff. Patient states she does not remember the fall or what caused it. C/o neck and low back pain with abrasions noted to chin area . Hx of HTN and diastolic dysfunction EXAM: LUMBAR SPINE - COMPLETE 4+ VIEW COMPARISON:  CT abdomen pelvis, 09/20/2017. FINDINGS: Chronic mild to moderate compression fracture of L2 stable from the prior CT. No other fractures. Grade 1 anterolisthesis of L4 on L5. Grade 1 anterolisthesis of L5 on S1. These are stable. No other spondylolisthesis. Mild loss of disc height at L1-L2 and L3-L4. Mild to moderate loss of disc height at L4-L5 and L5-S1. There are facet degenerative changes in the lower lumbar spine. Skeletal structures are diffusely  demineralized. Soft tissues are unremarkable. IMPRESSION: 1. No acute fracture or acute finding. 2. Chronic L2 fracture. Chronic degenerative changes. Findings without significant change compared to the CT dated 09/20/2017. Electronically Signed   By: Lajean Manes M.D.   On: 06/17/2021 12:29   DG Pelvis 1-2 Views  Result Date: 06/17/2021 CLINICAL DATA:  Patient brought in via ems from assisted living home. Patient states she fell this morning and was found on the ground next to a broken vase by staff. Patient states she does not remember the fall or what caused it. C/o neck and low back pain with abrasions noted to chin area . Hx of HTN and diastolic dysfunction EXAM: PELVIS - 1-2 VIEW COMPARISON:  None. FINDINGS: No fracture or bone lesion. Hip joints, SI joints and symphysis pubis are normally aligned. Skeletal structures are demineralized. Soft tissues are unremarkable. IMPRESSION: 1. No fracture or acute finding. Electronically Signed   By: Lajean Manes M.D.   On: 06/17/2021 12:26   DG Shoulder Right  Result Date: 06/17/2021 CLINICAL DATA:  Patient brought in via ems from assisted living home. Patient states she fell this morning and was found on the ground next to a broken vase by staff. Patient states she does not remember the fall or what caused it. C/o neck and low back pain with abrasions noted to chin area . Hx of HTN and diastolic dysfunction EXAM: RIGHT SHOULDER - 2+ VIEW COMPARISON:  None. FINDINGS: No fracture or dislocation. Skeletal structures are diffusely demineralized. Narrowed subacromial space to 3 mm consistent with a chronic rotator cuff disruption. Mild AC joint osteoarthritis. Subacromial spur. Soft tissues are unremarkable. IMPRESSION: 1. No fracture or dislocation. 2. Mild AC joint osteoarthritis, subacromial spur and narrowed subacromial space consistent with a chronic rotator cuff tear. Electronically Signed   By: Lajean Manes M.D.   On: 06/17/2021 12:24   CT Head Wo  Contrast  Result Date: 06/17/2021 CLINICAL DATA:  Fall with neck and low back pain.  Chin abrasions. EXAM: CT HEAD WITHOUT CONTRAST CT MAXILLOFACIAL WITHOUT CONTRAST CT CERVICAL SPINE WITHOUT CONTRAST TECHNIQUE: Multidetector CT imaging of the head, cervical spine, and maxillofacial structures were performed using the standard protocol without intravenous contrast. Multiplanar CT image reconstructions of the cervical spine and maxillofacial structures were also generated. RADIATION DOSE REDUCTION: This exam was performed according to the departmental dose-optimization program which includes automated exposure control, adjustment of the mA and/or kV according to patient size and/or use of iterative reconstruction technique. COMPARISON:  02/14/2021 FINDINGS: CT HEAD FINDINGS Brain: No evidence of acute infarction, hemorrhage, hydrocephalus, extra-axial collection or mass lesion/mass effect. Small remote cerebellar infarcts bilaterally. Chronic lacunar infarcts at the bilateral thalamus and right caudate head. Ischemic gliosis in the hemispheric white matter. Vascular: Atheromatous calcification. Skull: Normal. Negative for fracture or focal lesion. CT MAXILLOFACIAL FINDINGS Osseous: No fracture or mandibular dislocation. No destructive process. Orbits: Negative. No traumatic or inflammatory finding. Sinuses: Clear. Soft tissues: Negative CT CERVICAL SPINE FINDINGS Alignment: No traumatic malalignment Skull base and vertebrae: No acute fracture. Generalized osteopenia. Soft tissues and spinal canal: No prevertebral fluid or swelling. No visible canal hematoma. Disc levels: Generalized degenerative changes without visible cordimpingement Upper chest: No visible injury IMPRESSION: 1. No evidence of acute intracranial injury. 2. Negative for facial or cervical spine fracture. 3. Chronic small infarcts in the deep gray nuclei and cerebellum. Electronically Signed   By: Jorje Guild M.D.   On: 06/17/2021 12:13   CT  Cervical Spine Wo Contrast  Result Date: 06/17/2021 CLINICAL DATA:  Fall with neck and low back pain.  Chin abrasions. EXAM: CT HEAD WITHOUT CONTRAST CT MAXILLOFACIAL WITHOUT CONTRAST CT CERVICAL SPINE WITHOUT CONTRAST TECHNIQUE: Multidetector CT imaging of the head, cervical spine, and maxillofacial structures were performed using the standard protocol without intravenous contrast.  Multiplanar CT image reconstructions of the cervical spine and maxillofacial structures were also generated. RADIATION DOSE REDUCTION: This exam was performed according to the departmental dose-optimization program which includes automated exposure control, adjustment of the mA and/or kV according to patient size and/or use of iterative reconstruction technique. COMPARISON:  02/14/2021 FINDINGS: CT HEAD FINDINGS Brain: No evidence of acute infarction, hemorrhage, hydrocephalus, extra-axial collection or mass lesion/mass effect. Small remote cerebellar infarcts bilaterally. Chronic lacunar infarcts at the bilateral thalamus and right caudate head. Ischemic gliosis in the hemispheric white matter. Vascular: Atheromatous calcification. Skull: Normal. Negative for fracture or focal lesion. CT MAXILLOFACIAL FINDINGS Osseous: No fracture or mandibular dislocation. No destructive process. Orbits: Negative. No traumatic or inflammatory finding. Sinuses: Clear. Soft tissues: Negative CT CERVICAL SPINE FINDINGS Alignment: No traumatic malalignment Skull base and vertebrae: No acute fracture. Generalized osteopenia. Soft tissues and spinal canal: No prevertebral fluid or swelling. No visible canal hematoma. Disc levels: Generalized degenerative changes without visible cordimpingement Upper chest: No visible injury IMPRESSION: 1. No evidence of acute intracranial injury. 2. Negative for facial or cervical spine fracture. 3. Chronic small infarcts in the deep gray nuclei and cerebellum. Electronically Signed   By: Jorje Guild M.D.   On:  06/17/2021 12:13   CT Maxillofacial Wo Contrast  Result Date: 06/17/2021 CLINICAL DATA:  Fall with neck and low back pain.  Chin abrasions. EXAM: CT HEAD WITHOUT CONTRAST CT MAXILLOFACIAL WITHOUT CONTRAST CT CERVICAL SPINE WITHOUT CONTRAST TECHNIQUE: Multidetector CT imaging of the head, cervical spine, and maxillofacial structures were performed using the standard protocol without intravenous contrast. Multiplanar CT image reconstructions of the cervical spine and maxillofacial structures were also generated. RADIATION DOSE REDUCTION: This exam was performed according to the departmental dose-optimization program which includes automated exposure control, adjustment of the mA and/or kV according to patient size and/or use of iterative reconstruction technique. COMPARISON:  02/14/2021 FINDINGS: CT HEAD FINDINGS Brain: No evidence of acute infarction, hemorrhage, hydrocephalus, extra-axial collection or mass lesion/mass effect. Small remote cerebellar infarcts bilaterally. Chronic lacunar infarcts at the bilateral thalamus and right caudate head. Ischemic gliosis in the hemispheric white matter. Vascular: Atheromatous calcification. Skull: Normal. Negative for fracture or focal lesion. CT MAXILLOFACIAL FINDINGS Osseous: No fracture or mandibular dislocation. No destructive process. Orbits: Negative. No traumatic or inflammatory finding. Sinuses: Clear. Soft tissues: Negative CT CERVICAL SPINE FINDINGS Alignment: No traumatic malalignment Skull base and vertebrae: No acute fracture. Generalized osteopenia. Soft tissues and spinal canal: No prevertebral fluid or swelling. No visible canal hematoma. Disc levels: Generalized degenerative changes without visible cordimpingement Upper chest: No visible injury IMPRESSION: 1. No evidence of acute intracranial injury. 2. Negative for facial or cervical spine fracture. 3. Chronic small infarcts in the deep gray nuclei and cerebellum. Electronically Signed   By: Jorje Guild M.D.   On: 06/17/2021 12:13    Procedures Procedures    Medications Ordered in ED Medications  HYDROcodone-acetaminophen (NORCO/VICODIN) 5-325 MG per tablet 1 tablet (0 tablets Oral Hold 06/17/21 1416)  acetaminophen (TYLENOL) tablet 650 mg (650 mg Oral Given 06/17/21 1119)    ED Course/ Medical Decision Making/ A&P                           Medical Decision Making Amount and/or Complexity of Data Reviewed Labs: ordered. Radiology: ordered.  Risk OTC drugs. Prescription drug management.   This patient presents to the ED for concern of fall, this involves an extensive number of treatment options, and  is a complaint that carries with it a high risk of complications and morbidity.  The differential diagnosis includes fractures, internal injury   Co morbidities that complicate the patient evaluation  Alcohol abuse   Additional history obtained:  Additional history obtained from epic chart review    Lab Tests:  I Ordered, and personally interpreted labs.  The pertinent results include:  cbc and cmp essentially nl.  Etoh is elevated at 164.   Imaging Studies ordered:  I ordered imaging studies including ct head/ct c-spine/ct face.  Plain films of lumbar, pelvis, chest, right shoulder  I independently visualized and interpreted imaging which showed nothing acute I agree with the radiologist interpretation   Cardiac Monitoring:  The patient was maintained on a cardiac monitor.  I personally viewed and interpreted the cardiac monitored which showed an underlying rhythm of: nsr   Medicines ordered and prescription drug management:  I ordered medication including tylenol  for pain  Reevaluation of the patient after these medicines showed that the patient improved I have reviewed the patients home medicines and have made adjustments as needed   Problem List / ED Course:  Fall:  no internal injury or fx.  Pt is able to ambulate.  Fall likely due to alcohol use.   Pt did not want me to tell her daughter that she'd been drinking. Nurse wrote a note saying that she had trouble getting up off the toilet.  However, I personally walked her with a walker and she could walk.   Reevaluation:  After the interventions noted above, I reevaluated the patient and found that they have :improved   Social Determinants of Health:  Assisted living   Dispostion:  After consideration of the diagnostic results and the patients response to treatment, I feel that the patent would benefit from discharge with outpatient follow up.  Pt is to return if worse.         Final Clinical Impression(s) / ED Diagnoses Final diagnoses:  Fall, initial encounter  Alcoholic intoxication without complication ([place])  Facial contusion, initial encounter    Rx / DC Orders ED Discharge Orders     None         Isla Pence, MD 06/17/21 1400    Isla Pence, MD 06/17/21 1427

## 2021-06-17 NOTE — ED Notes (Signed)
Pt ambulated to restroom with walker and assistance. Pt was unable to get off the toilet without assistance and unable to ambulate back. Pt had to use wheelchair to get back to the bed. I was unable to collect urine sample because pt peed in brief and on floor.

## 2021-06-29 DIAGNOSIS — R0989 Other specified symptoms and signs involving the circulatory and respiratory systems: Secondary | ICD-10-CM | POA: Diagnosis not present

## 2021-06-29 DIAGNOSIS — R2689 Other abnormalities of gait and mobility: Secondary | ICD-10-CM | POA: Diagnosis not present

## 2021-07-03 DIAGNOSIS — N3281 Overactive bladder: Secondary | ICD-10-CM | POA: Diagnosis not present

## 2021-07-03 DIAGNOSIS — R296 Repeated falls: Secondary | ICD-10-CM | POA: Diagnosis not present

## 2021-07-03 DIAGNOSIS — M546 Pain in thoracic spine: Secondary | ICD-10-CM | POA: Diagnosis not present

## 2021-07-03 DIAGNOSIS — M8580 Other specified disorders of bone density and structure, unspecified site: Secondary | ICD-10-CM | POA: Diagnosis not present

## 2021-07-03 DIAGNOSIS — Z789 Other specified health status: Secondary | ICD-10-CM | POA: Diagnosis not present

## 2021-07-03 DIAGNOSIS — S32020S Wedge compression fracture of second lumbar vertebra, sequela: Secondary | ICD-10-CM | POA: Diagnosis not present

## 2021-07-03 DIAGNOSIS — S22060D Wedge compression fracture of T7-T8 vertebra, subsequent encounter for fracture with routine healing: Secondary | ICD-10-CM | POA: Diagnosis not present

## 2021-07-07 DIAGNOSIS — Z791 Long term (current) use of non-steroidal anti-inflammatories (NSAID): Secondary | ICD-10-CM | POA: Diagnosis not present

## 2021-07-07 DIAGNOSIS — M8008XD Age-related osteoporosis with current pathological fracture, vertebra(e), subsequent encounter for fracture with routine healing: Secondary | ICD-10-CM | POA: Diagnosis not present

## 2021-07-07 DIAGNOSIS — N3281 Overactive bladder: Secondary | ICD-10-CM | POA: Diagnosis not present

## 2021-07-07 DIAGNOSIS — K219 Gastro-esophageal reflux disease without esophagitis: Secondary | ICD-10-CM | POA: Diagnosis not present

## 2021-07-07 DIAGNOSIS — E039 Hypothyroidism, unspecified: Secondary | ICD-10-CM | POA: Diagnosis not present

## 2021-07-07 DIAGNOSIS — M1711 Unilateral primary osteoarthritis, right knee: Secondary | ICD-10-CM | POA: Diagnosis not present

## 2021-07-07 DIAGNOSIS — Z9181 History of falling: Secondary | ICD-10-CM | POA: Diagnosis not present

## 2021-07-07 DIAGNOSIS — Z96652 Presence of left artificial knee joint: Secondary | ICD-10-CM | POA: Diagnosis not present

## 2021-07-07 DIAGNOSIS — I10 Essential (primary) hypertension: Secondary | ICD-10-CM | POA: Diagnosis not present

## 2021-07-07 DIAGNOSIS — M8580 Other specified disorders of bone density and structure, unspecified site: Secondary | ICD-10-CM | POA: Diagnosis not present

## 2021-07-21 DIAGNOSIS — M8580 Other specified disorders of bone density and structure, unspecified site: Secondary | ICD-10-CM | POA: Diagnosis not present

## 2021-07-21 DIAGNOSIS — M1711 Unilateral primary osteoarthritis, right knee: Secondary | ICD-10-CM | POA: Diagnosis not present

## 2021-07-21 DIAGNOSIS — Z9181 History of falling: Secondary | ICD-10-CM | POA: Diagnosis not present

## 2021-07-21 DIAGNOSIS — E039 Hypothyroidism, unspecified: Secondary | ICD-10-CM | POA: Diagnosis not present

## 2021-07-21 DIAGNOSIS — N3281 Overactive bladder: Secondary | ICD-10-CM | POA: Diagnosis not present

## 2021-07-21 DIAGNOSIS — I10 Essential (primary) hypertension: Secondary | ICD-10-CM | POA: Diagnosis not present

## 2021-07-21 DIAGNOSIS — M8008XD Age-related osteoporosis with current pathological fracture, vertebra(e), subsequent encounter for fracture with routine healing: Secondary | ICD-10-CM | POA: Diagnosis not present

## 2021-07-21 DIAGNOSIS — Z96652 Presence of left artificial knee joint: Secondary | ICD-10-CM | POA: Diagnosis not present

## 2021-07-21 DIAGNOSIS — Z791 Long term (current) use of non-steroidal anti-inflammatories (NSAID): Secondary | ICD-10-CM | POA: Diagnosis not present

## 2021-07-21 DIAGNOSIS — K219 Gastro-esophageal reflux disease without esophagitis: Secondary | ICD-10-CM | POA: Diagnosis not present

## 2021-07-26 DIAGNOSIS — K449 Diaphragmatic hernia without obstruction or gangrene: Secondary | ICD-10-CM | POA: Diagnosis not present

## 2021-07-26 DIAGNOSIS — I10 Essential (primary) hypertension: Secondary | ICD-10-CM | POA: Diagnosis not present

## 2021-07-26 DIAGNOSIS — K219 Gastro-esophageal reflux disease without esophagitis: Secondary | ICD-10-CM | POA: Diagnosis not present

## 2021-07-26 DIAGNOSIS — M85851 Other specified disorders of bone density and structure, right thigh: Secondary | ICD-10-CM | POA: Diagnosis not present

## 2021-08-07 DIAGNOSIS — M8008XD Age-related osteoporosis with current pathological fracture, vertebra(e), subsequent encounter for fracture with routine healing: Secondary | ICD-10-CM | POA: Diagnosis not present

## 2021-08-07 DIAGNOSIS — K219 Gastro-esophageal reflux disease without esophagitis: Secondary | ICD-10-CM | POA: Diagnosis not present

## 2021-08-07 DIAGNOSIS — K449 Diaphragmatic hernia without obstruction or gangrene: Secondary | ICD-10-CM | POA: Diagnosis not present

## 2021-08-07 DIAGNOSIS — I10 Essential (primary) hypertension: Secondary | ICD-10-CM | POA: Diagnosis not present

## 2021-08-08 DIAGNOSIS — M8008XD Age-related osteoporosis with current pathological fracture, vertebra(e), subsequent encounter for fracture with routine healing: Secondary | ICD-10-CM | POA: Diagnosis not present

## 2021-08-08 DIAGNOSIS — I10 Essential (primary) hypertension: Secondary | ICD-10-CM | POA: Diagnosis not present

## 2021-08-08 DIAGNOSIS — N3281 Overactive bladder: Secondary | ICD-10-CM | POA: Diagnosis not present

## 2021-08-08 DIAGNOSIS — K219 Gastro-esophageal reflux disease without esophagitis: Secondary | ICD-10-CM | POA: Diagnosis not present

## 2021-08-08 DIAGNOSIS — E039 Hypothyroidism, unspecified: Secondary | ICD-10-CM | POA: Diagnosis not present

## 2021-08-08 DIAGNOSIS — M1711 Unilateral primary osteoarthritis, right knee: Secondary | ICD-10-CM | POA: Diagnosis not present

## 2021-08-08 DIAGNOSIS — Z791 Long term (current) use of non-steroidal anti-inflammatories (NSAID): Secondary | ICD-10-CM | POA: Diagnosis not present

## 2021-08-08 DIAGNOSIS — Z9181 History of falling: Secondary | ICD-10-CM | POA: Diagnosis not present

## 2021-08-08 DIAGNOSIS — M8580 Other specified disorders of bone density and structure, unspecified site: Secondary | ICD-10-CM | POA: Diagnosis not present

## 2021-08-08 DIAGNOSIS — Z96652 Presence of left artificial knee joint: Secondary | ICD-10-CM | POA: Diagnosis not present

## 2021-08-24 DIAGNOSIS — Z9181 History of falling: Secondary | ICD-10-CM | POA: Diagnosis not present

## 2021-08-24 DIAGNOSIS — M8008XD Age-related osteoporosis with current pathological fracture, vertebra(e), subsequent encounter for fracture with routine healing: Secondary | ICD-10-CM | POA: Diagnosis not present

## 2021-08-24 DIAGNOSIS — Z791 Long term (current) use of non-steroidal anti-inflammatories (NSAID): Secondary | ICD-10-CM | POA: Diagnosis not present

## 2021-08-24 DIAGNOSIS — Z96652 Presence of left artificial knee joint: Secondary | ICD-10-CM | POA: Diagnosis not present

## 2021-08-24 DIAGNOSIS — K219 Gastro-esophageal reflux disease without esophagitis: Secondary | ICD-10-CM | POA: Diagnosis not present

## 2021-08-24 DIAGNOSIS — E039 Hypothyroidism, unspecified: Secondary | ICD-10-CM | POA: Diagnosis not present

## 2021-08-24 DIAGNOSIS — M8580 Other specified disorders of bone density and structure, unspecified site: Secondary | ICD-10-CM | POA: Diagnosis not present

## 2021-08-24 DIAGNOSIS — N3281 Overactive bladder: Secondary | ICD-10-CM | POA: Diagnosis not present

## 2021-08-24 DIAGNOSIS — I10 Essential (primary) hypertension: Secondary | ICD-10-CM | POA: Diagnosis not present

## 2021-08-24 DIAGNOSIS — M1711 Unilateral primary osteoarthritis, right knee: Secondary | ICD-10-CM | POA: Diagnosis not present

## 2021-10-03 DIAGNOSIS — N3281 Overactive bladder: Secondary | ICD-10-CM | POA: Diagnosis not present

## 2021-10-03 DIAGNOSIS — M8000XD Age-related osteoporosis with current pathological fracture, unspecified site, subsequent encounter for fracture with routine healing: Secondary | ICD-10-CM | POA: Diagnosis not present

## 2021-10-03 DIAGNOSIS — R296 Repeated falls: Secondary | ICD-10-CM | POA: Diagnosis not present

## 2021-10-11 DIAGNOSIS — M810 Age-related osteoporosis without current pathological fracture: Secondary | ICD-10-CM | POA: Diagnosis not present

## 2021-12-14 DIAGNOSIS — E785 Hyperlipidemia, unspecified: Secondary | ICD-10-CM | POA: Diagnosis not present

## 2021-12-14 DIAGNOSIS — I10 Essential (primary) hypertension: Secondary | ICD-10-CM | POA: Diagnosis not present

## 2021-12-14 DIAGNOSIS — E039 Hypothyroidism, unspecified: Secondary | ICD-10-CM | POA: Diagnosis not present

## 2021-12-19 DIAGNOSIS — M8000XD Age-related osteoporosis with current pathological fracture, unspecified site, subsequent encounter for fracture with routine healing: Secondary | ICD-10-CM | POA: Diagnosis not present

## 2021-12-19 DIAGNOSIS — D692 Other nonthrombocytopenic purpura: Secondary | ICD-10-CM | POA: Diagnosis not present

## 2021-12-19 DIAGNOSIS — E039 Hypothyroidism, unspecified: Secondary | ICD-10-CM | POA: Diagnosis not present

## 2021-12-19 DIAGNOSIS — N3281 Overactive bladder: Secondary | ICD-10-CM | POA: Diagnosis not present

## 2021-12-19 DIAGNOSIS — Z8781 Personal history of (healed) traumatic fracture: Secondary | ICD-10-CM | POA: Diagnosis not present

## 2021-12-19 DIAGNOSIS — E871 Hypo-osmolality and hyponatremia: Secondary | ICD-10-CM | POA: Diagnosis not present

## 2021-12-19 DIAGNOSIS — I10 Essential (primary) hypertension: Secondary | ICD-10-CM | POA: Diagnosis not present

## 2021-12-19 DIAGNOSIS — I471 Supraventricular tachycardia: Secondary | ICD-10-CM | POA: Diagnosis not present

## 2021-12-19 DIAGNOSIS — Z23 Encounter for immunization: Secondary | ICD-10-CM | POA: Diagnosis not present

## 2021-12-19 DIAGNOSIS — I5032 Chronic diastolic (congestive) heart failure: Secondary | ICD-10-CM | POA: Diagnosis not present

## 2021-12-19 DIAGNOSIS — E785 Hyperlipidemia, unspecified: Secondary | ICD-10-CM | POA: Diagnosis not present

## 2021-12-19 DIAGNOSIS — Z0000 Encounter for general adult medical examination without abnormal findings: Secondary | ICD-10-CM | POA: Diagnosis not present

## 2021-12-19 DIAGNOSIS — K219 Gastro-esophageal reflux disease without esophagitis: Secondary | ICD-10-CM | POA: Diagnosis not present

## 2021-12-21 DIAGNOSIS — R001 Bradycardia, unspecified: Secondary | ICD-10-CM | POA: Diagnosis not present

## 2022-01-03 ENCOUNTER — Inpatient Hospital Stay (HOSPITAL_COMMUNITY)
Admission: EM | Admit: 2022-01-03 | Discharge: 2022-01-12 | DRG: 280 | Disposition: A | Discharge status: Skilled Nursing Facility | Payer: PPO | Source: Skilled Nursing Facility | Attending: Internal Medicine | Admitting: Internal Medicine

## 2022-01-03 DIAGNOSIS — I10 Essential (primary) hypertension: Secondary | ICD-10-CM | POA: Diagnosis not present

## 2022-01-03 DIAGNOSIS — I959 Hypotension, unspecified: Secondary | ICD-10-CM | POA: Diagnosis present

## 2022-01-03 DIAGNOSIS — Z7982 Long term (current) use of aspirin: Secondary | ICD-10-CM

## 2022-01-03 DIAGNOSIS — I420 Dilated cardiomyopathy: Secondary | ICD-10-CM | POA: Diagnosis present

## 2022-01-03 DIAGNOSIS — I493 Ventricular premature depolarization: Secondary | ICD-10-CM | POA: Diagnosis present

## 2022-01-03 DIAGNOSIS — E871 Hypo-osmolality and hyponatremia: Secondary | ICD-10-CM | POA: Diagnosis not present

## 2022-01-03 DIAGNOSIS — I214 Non-ST elevation (NSTEMI) myocardial infarction: Secondary | ICD-10-CM | POA: Diagnosis not present

## 2022-01-03 DIAGNOSIS — G9341 Metabolic encephalopathy: Secondary | ICD-10-CM | POA: Diagnosis not present

## 2022-01-03 DIAGNOSIS — E039 Hypothyroidism, unspecified: Secondary | ICD-10-CM | POA: Diagnosis present

## 2022-01-03 DIAGNOSIS — Z634 Disappearance and death of family member: Secondary | ICD-10-CM | POA: Diagnosis not present

## 2022-01-03 DIAGNOSIS — K449 Diaphragmatic hernia without obstruction or gangrene: Secondary | ICD-10-CM | POA: Diagnosis not present

## 2022-01-03 DIAGNOSIS — Z20822 Contact with and (suspected) exposure to covid-19: Secondary | ICD-10-CM | POA: Diagnosis present

## 2022-01-03 DIAGNOSIS — M6259 Muscle wasting and atrophy, not elsewhere classified, multiple sites: Secondary | ICD-10-CM | POA: Diagnosis not present

## 2022-01-03 DIAGNOSIS — E869 Volume depletion, unspecified: Secondary | ICD-10-CM | POA: Diagnosis not present

## 2022-01-03 DIAGNOSIS — I4892 Unspecified atrial flutter: Secondary | ICD-10-CM | POA: Diagnosis present

## 2022-01-03 DIAGNOSIS — J80 Acute respiratory distress syndrome: Secondary | ICD-10-CM | POA: Diagnosis not present

## 2022-01-03 DIAGNOSIS — K219 Gastro-esophageal reflux disease without esophagitis: Secondary | ICD-10-CM | POA: Diagnosis present

## 2022-01-03 DIAGNOSIS — F1010 Alcohol abuse, uncomplicated: Secondary | ICD-10-CM | POA: Diagnosis present

## 2022-01-03 DIAGNOSIS — Z79899 Other long term (current) drug therapy: Secondary | ICD-10-CM | POA: Diagnosis not present

## 2022-01-03 DIAGNOSIS — M6281 Muscle weakness (generalized): Secondary | ICD-10-CM | POA: Diagnosis not present

## 2022-01-03 DIAGNOSIS — Z833 Family history of diabetes mellitus: Secondary | ICD-10-CM | POA: Diagnosis not present

## 2022-01-03 DIAGNOSIS — I110 Hypertensive heart disease with heart failure: Principal | ICD-10-CM | POA: Diagnosis present

## 2022-01-03 DIAGNOSIS — I25110 Atherosclerotic heart disease of native coronary artery with unstable angina pectoris: Secondary | ICD-10-CM | POA: Diagnosis not present

## 2022-01-03 DIAGNOSIS — J9601 Acute respiratory failure with hypoxia: Secondary | ICD-10-CM | POA: Diagnosis present

## 2022-01-03 DIAGNOSIS — Z803 Family history of malignant neoplasm of breast: Secondary | ICD-10-CM

## 2022-01-03 DIAGNOSIS — I5032 Chronic diastolic (congestive) heart failure: Secondary | ICD-10-CM | POA: Diagnosis present

## 2022-01-03 DIAGNOSIS — I4819 Other persistent atrial fibrillation: Secondary | ICD-10-CM | POA: Diagnosis present

## 2022-01-03 DIAGNOSIS — R0689 Other abnormalities of breathing: Secondary | ICD-10-CM | POA: Diagnosis not present

## 2022-01-03 DIAGNOSIS — Z79890 Hormone replacement therapy (postmenopausal): Secondary | ICD-10-CM | POA: Diagnosis not present

## 2022-01-03 DIAGNOSIS — I480 Paroxysmal atrial fibrillation: Secondary | ICD-10-CM | POA: Diagnosis not present

## 2022-01-03 DIAGNOSIS — I5043 Acute on chronic combined systolic (congestive) and diastolic (congestive) heart failure: Secondary | ICD-10-CM | POA: Diagnosis present

## 2022-01-03 DIAGNOSIS — F1011 Alcohol abuse, in remission: Secondary | ICD-10-CM | POA: Diagnosis present

## 2022-01-03 DIAGNOSIS — J189 Pneumonia, unspecified organism: Secondary | ICD-10-CM | POA: Diagnosis present

## 2022-01-03 DIAGNOSIS — R0602 Shortness of breath: Secondary | ICD-10-CM | POA: Diagnosis present

## 2022-01-03 DIAGNOSIS — R079 Chest pain, unspecified: Secondary | ICD-10-CM | POA: Diagnosis not present

## 2022-01-03 DIAGNOSIS — I491 Atrial premature depolarization: Secondary | ICD-10-CM | POA: Diagnosis not present

## 2022-01-03 DIAGNOSIS — Z8249 Family history of ischemic heart disease and other diseases of the circulatory system: Secondary | ICD-10-CM | POA: Diagnosis not present

## 2022-01-03 DIAGNOSIS — R739 Hyperglycemia, unspecified: Secondary | ICD-10-CM | POA: Diagnosis present

## 2022-01-03 DIAGNOSIS — E785 Hyperlipidemia, unspecified: Secondary | ICD-10-CM | POA: Diagnosis present

## 2022-01-03 DIAGNOSIS — Z741 Need for assistance with personal care: Secondary | ICD-10-CM | POA: Diagnosis not present

## 2022-01-03 DIAGNOSIS — R2681 Unsteadiness on feet: Secondary | ICD-10-CM | POA: Diagnosis not present

## 2022-01-03 DIAGNOSIS — I5031 Acute diastolic (congestive) heart failure: Secondary | ICD-10-CM | POA: Diagnosis not present

## 2022-01-03 DIAGNOSIS — R918 Other nonspecific abnormal finding of lung field: Secondary | ICD-10-CM | POA: Diagnosis not present

## 2022-01-03 DIAGNOSIS — I471 Supraventricular tachycardia: Secondary | ICD-10-CM | POA: Diagnosis not present

## 2022-01-03 DIAGNOSIS — R000 Tachycardia, unspecified: Secondary | ICD-10-CM | POA: Diagnosis not present

## 2022-01-03 DIAGNOSIS — I5021 Acute systolic (congestive) heart failure: Secondary | ICD-10-CM | POA: Diagnosis not present

## 2022-01-04 ENCOUNTER — Emergency Department (HOSPITAL_COMMUNITY): Payer: PPO

## 2022-01-04 ENCOUNTER — Observation Stay (HOSPITAL_COMMUNITY): Payer: PPO

## 2022-01-04 ENCOUNTER — Other Ambulatory Visit: Payer: Self-pay

## 2022-01-04 ENCOUNTER — Encounter (HOSPITAL_COMMUNITY): Payer: Self-pay | Admitting: *Deleted

## 2022-01-04 DIAGNOSIS — F1011 Alcohol abuse, in remission: Secondary | ICD-10-CM | POA: Diagnosis not present

## 2022-01-04 DIAGNOSIS — R739 Hyperglycemia, unspecified: Secondary | ICD-10-CM | POA: Diagnosis present

## 2022-01-04 DIAGNOSIS — E785 Hyperlipidemia, unspecified: Secondary | ICD-10-CM | POA: Diagnosis present

## 2022-01-04 DIAGNOSIS — I420 Dilated cardiomyopathy: Secondary | ICD-10-CM | POA: Diagnosis present

## 2022-01-04 DIAGNOSIS — I480 Paroxysmal atrial fibrillation: Secondary | ICD-10-CM | POA: Diagnosis not present

## 2022-01-04 DIAGNOSIS — I4892 Unspecified atrial flutter: Secondary | ICD-10-CM | POA: Diagnosis present

## 2022-01-04 DIAGNOSIS — E871 Hypo-osmolality and hyponatremia: Secondary | ICD-10-CM | POA: Diagnosis not present

## 2022-01-04 DIAGNOSIS — Z833 Family history of diabetes mellitus: Secondary | ICD-10-CM | POA: Diagnosis not present

## 2022-01-04 DIAGNOSIS — I5043 Acute on chronic combined systolic (congestive) and diastolic (congestive) heart failure: Secondary | ICD-10-CM | POA: Diagnosis present

## 2022-01-04 DIAGNOSIS — R079 Chest pain, unspecified: Secondary | ICD-10-CM | POA: Diagnosis not present

## 2022-01-04 DIAGNOSIS — I10 Essential (primary) hypertension: Secondary | ICD-10-CM

## 2022-01-04 DIAGNOSIS — I5032 Chronic diastolic (congestive) heart failure: Secondary | ICD-10-CM | POA: Diagnosis not present

## 2022-01-04 DIAGNOSIS — G9341 Metabolic encephalopathy: Secondary | ICD-10-CM | POA: Diagnosis not present

## 2022-01-04 DIAGNOSIS — I5021 Acute systolic (congestive) heart failure: Secondary | ICD-10-CM | POA: Diagnosis not present

## 2022-01-04 DIAGNOSIS — I4819 Other persistent atrial fibrillation: Secondary | ICD-10-CM | POA: Diagnosis present

## 2022-01-04 DIAGNOSIS — I5031 Acute diastolic (congestive) heart failure: Secondary | ICD-10-CM

## 2022-01-04 DIAGNOSIS — I110 Hypertensive heart disease with heart failure: Secondary | ICD-10-CM | POA: Diagnosis present

## 2022-01-04 DIAGNOSIS — J9601 Acute respiratory failure with hypoxia: Secondary | ICD-10-CM

## 2022-01-04 DIAGNOSIS — Z7982 Long term (current) use of aspirin: Secondary | ICD-10-CM | POA: Diagnosis not present

## 2022-01-04 DIAGNOSIS — I959 Hypotension, unspecified: Secondary | ICD-10-CM | POA: Diagnosis present

## 2022-01-04 DIAGNOSIS — Z803 Family history of malignant neoplasm of breast: Secondary | ICD-10-CM | POA: Diagnosis not present

## 2022-01-04 DIAGNOSIS — Z20822 Contact with and (suspected) exposure to covid-19: Secondary | ICD-10-CM | POA: Diagnosis present

## 2022-01-04 DIAGNOSIS — F1010 Alcohol abuse, uncomplicated: Secondary | ICD-10-CM | POA: Diagnosis present

## 2022-01-04 DIAGNOSIS — Z8249 Family history of ischemic heart disease and other diseases of the circulatory system: Secondary | ICD-10-CM | POA: Diagnosis not present

## 2022-01-04 DIAGNOSIS — I25110 Atherosclerotic heart disease of native coronary artery with unstable angina pectoris: Secondary | ICD-10-CM | POA: Diagnosis not present

## 2022-01-04 DIAGNOSIS — I214 Non-ST elevation (NSTEMI) myocardial infarction: Secondary | ICD-10-CM | POA: Diagnosis not present

## 2022-01-04 DIAGNOSIS — Z634 Disappearance and death of family member: Secondary | ICD-10-CM | POA: Diagnosis not present

## 2022-01-04 DIAGNOSIS — R0602 Shortness of breath: Secondary | ICD-10-CM | POA: Diagnosis present

## 2022-01-04 DIAGNOSIS — Z79899 Other long term (current) drug therapy: Secondary | ICD-10-CM | POA: Diagnosis not present

## 2022-01-04 DIAGNOSIS — K219 Gastro-esophageal reflux disease without esophagitis: Secondary | ICD-10-CM | POA: Diagnosis present

## 2022-01-04 DIAGNOSIS — R918 Other nonspecific abnormal finding of lung field: Secondary | ICD-10-CM | POA: Diagnosis not present

## 2022-01-04 DIAGNOSIS — E039 Hypothyroidism, unspecified: Secondary | ICD-10-CM | POA: Diagnosis present

## 2022-01-04 DIAGNOSIS — Z79890 Hormone replacement therapy (postmenopausal): Secondary | ICD-10-CM | POA: Diagnosis not present

## 2022-01-04 DIAGNOSIS — K449 Diaphragmatic hernia without obstruction or gangrene: Secondary | ICD-10-CM | POA: Diagnosis not present

## 2022-01-04 DIAGNOSIS — J189 Pneumonia, unspecified organism: Secondary | ICD-10-CM | POA: Diagnosis present

## 2022-01-04 LAB — COMPREHENSIVE METABOLIC PANEL
ALT: 30 U/L (ref 0–44)
ALT: 26 U/L (ref 0–44)
AST: 28 U/L (ref 15–41)
AST: 23 U/L (ref 15–41)
Albumin: 3.6 g/dL (ref 3.5–5.0)
Albumin: 3.2 g/dL — ABNORMAL LOW (ref 3.5–5.0)
Alkaline Phosphatase: 36 U/L — ABNORMAL LOW (ref 38–126)
Alkaline Phosphatase: 29 U/L — ABNORMAL LOW (ref 38–126)
Anion gap: 10 (ref 5–15)
Anion gap: 11 (ref 5–15)
BUN: 29 mg/dL — ABNORMAL HIGH (ref 8–23)
BUN: 27 mg/dL — ABNORMAL HIGH (ref 8–23)
CO2: 22 mmol/L (ref 22–32)
CO2: 23 mmol/L (ref 22–32)
Calcium: 8.9 mg/dL (ref 8.9–10.3)
Calcium: 8.9 mg/dL (ref 8.9–10.3)
Chloride: 103 mmol/L (ref 98–111)
Chloride: 102 mmol/L (ref 98–111)
Creatinine, Ser: 1.03 mg/dL — ABNORMAL HIGH (ref 0.44–1.00)
Creatinine, Ser: 0.89 mg/dL (ref 0.44–1.00)
GFR, Estimated: 52 mL/min — ABNORMAL LOW (ref >60)
GFR, Estimated: >60 mL/min (ref >60)
Glucose, Bld: 152 mg/dL — ABNORMAL HIGH (ref 70–99)
Glucose, Bld: 103 mg/dL — ABNORMAL HIGH (ref 70–99)
Potassium: 3.9 mmol/L (ref 3.5–5.1)
Potassium: 4.4 mmol/L (ref 3.5–5.1)
Sodium: 135 mmol/L (ref 135–145)
Sodium: 136 mmol/L (ref 135–145)
Total Bilirubin: 0.7 mg/dL (ref 0.3–1.2)
Total Bilirubin: 0.7 mg/dL (ref 0.3–1.2)
Total Protein: 6.1 g/dL — ABNORMAL LOW (ref 6.5–8.1)
Total Protein: 5.3 g/dL — ABNORMAL LOW (ref 6.5–8.1)

## 2022-01-04 LAB — I-STAT VENOUS BLOOD GAS, ED
Acid-base deficit: 3 mmol/L — ABNORMAL HIGH (ref 0.0–2.0)
Bicarbonate: 23.5 mmol/L (ref 20.0–28.0)
Calcium, Ion: 1.1 mmol/L — ABNORMAL LOW (ref 1.15–1.40)
HCT: 39 % (ref 36.0–46.0)
Hemoglobin: 13.3 g/dL (ref 12.0–15.0)
O2 Saturation: 99 %
Potassium: 3.9 mmol/L (ref 3.5–5.1)
Sodium: 135 mmol/L (ref 135–145)
TCO2: 25 mmol/L (ref 22–32)
pCO2, Ven: 44.5 mmHg (ref 44–60)
pH, Ven: 7.331 (ref 7.25–7.43)
pO2, Ven: 138 mmHg — ABNORMAL HIGH (ref 32–45)

## 2022-01-04 LAB — CBC
HCT: 35.1 % — ABNORMAL LOW (ref 36.0–46.0)
HCT: 39.3 % (ref 36.0–46.0)
Hemoglobin: 11.6 g/dL — ABNORMAL LOW (ref 12.0–15.0)
Hemoglobin: 13.4 g/dL (ref 12.0–15.0)
MCH: 33.2 pg (ref 26.0–34.0)
MCH: 33.8 pg (ref 26.0–34.0)
MCHC: 33 g/dL (ref 30.0–36.0)
MCHC: 34.1 g/dL (ref 30.0–36.0)
MCV: 100.6 fL — ABNORMAL HIGH (ref 80.0–100.0)
MCV: 99.2 fL (ref 80.0–100.0)
Platelets: 266 10*3/uL (ref 150–400)
Platelets: 318 10*3/uL (ref 150–400)
RBC: 3.49 MIL/uL — ABNORMAL LOW (ref 3.87–5.11)
RBC: 3.96 MIL/uL (ref 3.87–5.11)
RDW: 12 % (ref 11.5–15.5)
RDW: 12.1 % (ref 11.5–15.5)
WBC: 8.6 10*3/uL (ref 4.0–10.5)
WBC: 8.7 10*3/uL (ref 4.0–10.5)
nRBC: 0 % (ref 0.0–0.2)
nRBC: 0 % (ref 0.0–0.2)

## 2022-01-04 LAB — ECHOCARDIOGRAM COMPLETE
Area-P 1/2: 4.24 cm2
Height: 64 in
S' Lateral: 3.8 cm
Weight: 2151.69 oz

## 2022-01-04 LAB — TROPONIN I (HIGH SENSITIVITY)
Troponin I (High Sensitivity): 123 ng/L (ref <18)
Troponin I (High Sensitivity): 128 ng/L (ref <18)
Troponin I (High Sensitivity): 102 ng/L (ref <18)

## 2022-01-04 LAB — RESP PANEL BY RT-PCR (FLU A&B, COVID) ARPGX2
Influenza A by PCR: NEGATIVE
Influenza B by PCR: NEGATIVE
SARS Coronavirus 2 by RT PCR: NEGATIVE

## 2022-01-04 LAB — LACTIC ACID, PLASMA
Lactic Acid, Venous: 1.7 mmol/L (ref 0.5–1.9)
Lactic Acid, Venous: 0.8 mmol/L (ref 0.5–1.9)

## 2022-01-04 LAB — PROCALCITONIN: Procalcitonin: <0.1 ng/mL

## 2022-01-04 LAB — BRAIN NATRIURETIC PEPTIDE: B Natriuretic Peptide: 519.6 pg/mL — ABNORMAL HIGH (ref 0.0–100.0)

## 2022-01-04 MED ORDER — AZITHROMYCIN 500 MG IV SOLR
500.0000 mg | Freq: Once | INTRAVENOUS | Status: AC
  Administered 2022-01-04: 500 mg via INTRAVENOUS
  Filled 2022-01-04: qty 5

## 2022-01-04 MED ORDER — NITROGLYCERIN IN D5W 200-5 MCG/ML-% IV SOLN
0.0000 ug/min | INTRAVENOUS | Status: DC
  Filled 2022-01-04: qty 250

## 2022-01-04 MED ORDER — ASPIRIN 81 MG PO TBEC: 81.0000 mg | DELAYED_RELEASE_TABLET | Freq: Every day | ORAL | Status: DC

## 2022-01-04 MED ORDER — ALBUTEROL SULFATE (2.5 MG/3ML) 0.083% IN NEBU: 2.5000 mg | INHALATION_SOLUTION | Freq: Four times a day (QID) | RESPIRATORY_TRACT | Status: DC

## 2022-01-04 MED ORDER — ACETAMINOPHEN 325 MG PO TABS
650.0000 mg | ORAL_TABLET | Freq: Four times a day (QID) | ORAL | Status: DC | PRN
  Administered 2022-01-06 – 2022-01-07 (×2): 650 mg via ORAL
  Filled 2022-01-04 (×2): qty 2

## 2022-01-04 MED ORDER — ENOXAPARIN SODIUM 40 MG/0.4ML IJ SOSY
40.0000 mg | PREFILLED_SYRINGE | INTRAMUSCULAR | Status: DC
  Administered 2022-01-04: 40 mg via SUBCUTANEOUS
  Filled 2022-01-04: qty 0.4

## 2022-01-04 MED ORDER — ASPIRIN 81 MG PO TBEC
81.0000 mg | DELAYED_RELEASE_TABLET | Freq: Every day | ORAL | Status: DC
  Administered 2022-01-05 – 2022-01-10 (×5): 81 mg via ORAL
  Filled 2022-01-04 (×5): qty 1

## 2022-01-04 MED ORDER — SODIUM CHLORIDE 0.9 % IV SOLN
1.0000 g | Freq: Once | INTRAVENOUS | Status: AC
  Administered 2022-01-04: 1 g via INTRAVENOUS
  Filled 2022-01-04: qty 10

## 2022-01-04 MED ORDER — HEPARIN BOLUS VIA INFUSION
3000.0000 [IU] | Freq: Once | INTRAVENOUS | Status: DC
  Filled 2022-01-04: qty 3000

## 2022-01-04 MED ORDER — ASPIRIN 81 MG PO CHEW: 325.0000 mg | CHEWABLE_TABLET | Freq: Every day | ORAL | Status: DC

## 2022-01-04 MED ORDER — LEVALBUTEROL HCL 0.63 MG/3ML IN NEBU
0.6300 mg | INHALATION_SOLUTION | Freq: Four times a day (QID) | RESPIRATORY_TRACT | Status: DC
  Administered 2022-01-05 (×2): 0.63 mg via RESPIRATORY_TRACT
  Filled 2022-01-04 (×6): qty 3

## 2022-01-04 MED ORDER — FUROSEMIDE 10 MG/ML IJ SOLN
40.0000 mg | Freq: Every day | INTRAMUSCULAR | Status: DC
  Administered 2022-01-04: 40 mg via INTRAVENOUS
  Filled 2022-01-04: qty 4

## 2022-01-04 MED ORDER — LORAZEPAM 1 MG PO TABS: 1.0000 mg | ORAL_TABLET | ORAL | Status: DC | PRN

## 2022-01-04 MED ORDER — ACETAMINOPHEN 650 MG RE SUPP: 650.0000 mg | Freq: Four times a day (QID) | RECTAL | Status: DC | PRN

## 2022-01-04 MED ORDER — FUROSEMIDE 10 MG/ML IJ SOLN
40.0000 mg | Freq: Two times a day (BID) | INTRAMUSCULAR | Status: DC
Start: 2022-01-05 — End: 2022-01-08
  Administered 2022-01-05 – 2022-01-07 (×5): 40 mg via INTRAVENOUS
  Filled 2022-01-04 (×5): qty 4

## 2022-01-04 MED ORDER — THIAMINE HCL 100 MG/ML IJ SOLN: 100.0000 mg | Freq: Every day | INTRAMUSCULAR | Status: DC

## 2022-01-04 MED ORDER — HEPARIN (PORCINE) 25000 UT/250ML-% IV SOLN
1000.0000 [IU]/h | INTRAVENOUS | Status: DC
  Administered 2022-01-04: 750 [IU]/h via INTRAVENOUS
  Administered 2022-01-06: 900 [IU]/h via INTRAVENOUS
  Administered 2022-01-07: 1000 [IU]/h via INTRAVENOUS
  Filled 2022-01-04 (×3): qty 250

## 2022-01-04 MED ORDER — ADULT MULTIVITAMIN W/MINERALS CH
1.0000 | ORAL_TABLET | Freq: Every day | ORAL | Status: DC
  Administered 2022-01-04 – 2022-01-05 (×2): 1 via ORAL
  Filled 2022-01-04 (×2): qty 1

## 2022-01-04 MED ORDER — ASPIRIN 81 MG PO CHEW
325.0000 mg | CHEWABLE_TABLET | Freq: Every day | ORAL | Status: DC
Start: 2022-01-04 — End: 2022-01-04
  Filled 2022-01-04: qty 5

## 2022-01-04 MED ORDER — FOLIC ACID 1 MG PO TABS
1.0000 mg | ORAL_TABLET | Freq: Every day | ORAL | Status: DC
  Administered 2022-01-04 – 2022-01-05 (×2): 1 mg via ORAL
  Filled 2022-01-04 (×2): qty 1

## 2022-01-04 MED ORDER — ALUM & MAG HYDROXIDE-SIMETH 200-200-20 MG/5ML PO SUSP
15.0000 mL | ORAL | Status: DC | PRN
  Administered 2022-01-04: 15 mL via ORAL
  Filled 2022-01-04: qty 30

## 2022-01-04 MED ORDER — IOHEXOL 350 MG/ML SOLN
50.0000 mL | Freq: Once | INTRAVENOUS | Status: AC | PRN
  Administered 2022-01-04: 50 mL via INTRAVENOUS

## 2022-01-04 MED ORDER — ONDANSETRON HCL 4 MG PO TABS: 4.0000 mg | ORAL_TABLET | Freq: Four times a day (QID) | ORAL | Status: DC | PRN

## 2022-01-04 MED ORDER — LORAZEPAM 2 MG/ML IJ SOLN: 1.0000 mg | INTRAMUSCULAR | Status: DC | PRN

## 2022-01-04 MED ORDER — DILTIAZEM LOAD VIA INFUSION
10.0000 mg | Freq: Once | INTRAVENOUS | Status: AC
  Administered 2022-01-04: 10 mg via INTRAVENOUS
  Filled 2022-01-04: qty 10

## 2022-01-04 MED ORDER — THIAMINE MONONITRATE 100 MG PO TABS
100.0000 mg | ORAL_TABLET | Freq: Every day | ORAL | Status: DC
  Administered 2022-01-04 – 2022-01-05 (×2): 100 mg via ORAL
  Filled 2022-01-04 (×2): qty 1

## 2022-01-04 MED ORDER — DILTIAZEM HCL-DEXTROSE 125-5 MG/125ML-% IV SOLN (PREMIX)
5.0000 mg/h | INTRAVENOUS | Status: DC
  Administered 2022-01-04: 5 mg/h via INTRAVENOUS
  Filled 2022-01-04: qty 125

## 2022-01-04 MED ORDER — ALUM & MAG HYDROXIDE-SIMETH 200-200-20 MG/5ML PO SUSP
15.0000 mL | Freq: Four times a day (QID) | ORAL | Status: DC | PRN
  Administered 2022-01-09: 15 mL via ORAL
  Filled 2022-01-04: qty 30

## 2022-01-04 MED ORDER — ASPIRIN 325 MG PO TABS
325.0000 mg | ORAL_TABLET | Freq: Once | ORAL | Status: AC
  Administered 2022-01-04: 325 mg via ORAL
  Filled 2022-01-04: qty 1

## 2022-01-04 MED ORDER — ONDANSETRON HCL 4 MG/2ML IJ SOLN
4.0000 mg | Freq: Four times a day (QID) | INTRAMUSCULAR | Status: DC | PRN
  Administered 2022-01-05: 4 mg via INTRAVENOUS
  Filled 2022-01-04: qty 2

## 2022-01-04 NOTE — ED Notes (Signed)
Ellen Chandler son [phone] requesting an update

## 2022-01-04 NOTE — Consult Note (Addendum)
Cardiology Consultation   Patient ID: TROI BECHTOLD MRN: [id number]; DOB: [birth year]  Admit date: 01/03/2022 Date of Consult: 01/04/2022  PCP:  Deland Pretty, [facility] Cardiologist:  Meda Coffee '14  Patient Profile:   SHEKITA BOYDEN is a 88 y.o. female with a hx of hypertension, HFpEF, GERD, hypothyroidism who is being seen 01/04/2022 for the evaluation of CHF at the request of Dr. Verlon Au.  History of Present Illness:   Ms. Markert is an 88 year old female with past medical history noted above.   Reported she had had stress testing back in 2000 secondary to an abnormal EKG which was reportedly normal. She was previously seen back in 2014 by Dr. Meda Coffee while inpatient in the setting of acute respiratory failure which was felt to be multifactorial with respiratory infection and mild heart failure.  Echocardiogram during that admission showed LVEF of 60 to 65%, no regional wall motion abnormality, grade 1 diastolic dysfunction, mild MR. She has been lost to follow-up since that time.  Presented to the ED on 8/31 with complaints of worsening dyspnea.  She current lives in independent living facility. Stated she felt " under the weather" throughout the day prior to presenting to the ED.  Later that evening she woke up around 11 PM and had trouble breathing.  Symptoms significantly worsened and she called her daughter on the phone who spoke with the front desk and ultimately called EMS for further evaluation.  She did require BiPAP in route and in the ED.  In the ED her labs showed sodium 135, potassium 3.9, creatinine 1.03, BNP 519, WBC 8.7, hemoglobin 13.4.,  High-sensitivity troponin 123>> 128>> 102. Chest x-ray with vascular congestion and left basilar infiltrate consistent with acute pneumonia.  EKG showed sinus rhythm 88 bpm, PVCs, prolonged QT, atrial enlargement.  She was admitted to internal medicine for further management.  She was started on Rocephin and  azithromycin in the ED.  COVID/flu negative.  Given her hypoxia she underwent a CT angio which was negative for PE, notable for ground glass attenuation bilaterally concerning for interstitial edema, small bilateral effusions and coronary artery calcifications.  Past Medical History:  Diagnosis Date   Abnormal ECG    a. Pt aware of long hx of abnl ecg->h/o normal stress testing in [place] ~ 2000.   Diastolic dysfunction    a. 04/2013 Echo: EF 60-65%, No rwma, Gr 1 DD, mild MR.   GERD (gastroesophageal reflux disease)    Hypertension    Hypothyroidism     Past Surgical History:  Procedure Laterality Date   ABDOMINAL HYSTERECTOMY     JOINT REPLACEMENT     a. knee left - injury resulted after being attacked by a dog.     Home Medications:  Prior to Admission medications   Medication Sig Start Date End Date Taking? Authorizing Provider  acetaminophen (TYLENOL) 500 MG tablet Take 1,000 mg by mouth every 6 (six) hours as needed for moderate pain.   Yes [provider]  amLODipine (NORVASC) 5 MG tablet Take 5 mg by mouth daily. 03/20/16  Yes [provider]  lactose free nutrition (BOOST) LIQD Take 237 mLs by mouth at bedtime.   Yes [provider]  levothyroxine (SYNTHROID, LEVOTHROID) 88 MCG tablet Take 88 mcg by mouth daily before breakfast.   Yes [provider]  Multiple Vitamin (MULTIVITAMIN WITH MINERALS) TABS tablet Take 1 tablet by mouth daily.   Yes [provider]  MYRBETRIQ 50 MG  TB24 tablet Take 50 mg by mouth daily. 10/03/21  Yes [provider]  nabumetone (RELAFEN) 500 MG tablet Take 500 mg by mouth daily.   Yes [provider]  pantoprazole (PROTONIX) 40 MG tablet Take 40 mg by mouth 2 (two) times daily. 09/18/21  Yes [provider]    Inpatient Medications: Scheduled Meds:  [START ON 01/05/2022] aspirin  325 mg Oral Daily   enoxaparin (LOVENOX) injection  40 mg Subcutaneous E83T   folic acid   1 mg Oral Daily   multivitamin with minerals  1 tablet Oral Daily   thiamine  100 mg Oral Daily   Or   thiamine  100 mg Intravenous Daily   Continuous Infusions:  PRN Meds: acetaminophen **OR** acetaminophen, LORazepam **OR** LORazepam, ondansetron **OR** ondansetron (ZOFRAN) IV  Allergies:   No Known Allergies  Social History:   Social History   Socioeconomic History   Marital status: Widowed    Spouse name: Not on file   Number of children: Not on file   Years of education: Not on file   Highest education level: Not on file  Occupational History   Not on file  Tobacco Use   Smoking status: Never   Smokeless tobacco: Never  Substance and Sexual Activity   Alcohol use: Yes   Drug use: No   Sexual activity: Not on file  Other Topics Concern   Not on file  Social History Narrative   Moved to [place] from [place] in June 2014.  Lives at [facility].  Was exercising daily.   Social Determinants of Health   Financial Resource Strain: Not on file  Food Insecurity: Not on file  Transportation Needs: Not on file  Physical Activity: Not on file  Stress: Not on file  Social Connections: Not on file  Intimate Partner Violence: Not on file    Family History:    Family History  Problem Relation Age of Onset   Heart attack Mother        died @ 19.   Diabetes type II Father        died @ 15.   Breast cancer Sister        died @ 37.     ROS:  Please see the history of present illness.   All other ROS reviewed and negative.     Physical Exam/Data:   Vitals:   01/04/22 1100 01/04/22 1130 01/04/22 1230 01/04/22 1245  BP: 126/81 129/82 125/70 (!) 140/73  Pulse: (!) 110 85 71 76  Resp: '16 20 14 17  '$ Temp:      TempSrc:      SpO2: 92% 95% 97% 97%  Weight:      Height:       No intake or output data in the 24 hours ending 01/04/22 1308    01/04/2022   12:19 AM 06/17/2021   11:03 AM 04/28/2020    9:04 AM  Last 3 Weights  Weight (lbs) 134  lb 7.7 oz 135 lb 149 lb 14.6 oz  Weight (kg) 61 kg 61.236 kg 68 kg     Body mass index is 23.08 kg/m.  General:  Well nourished, well developed, in no acute distress HEENT: normal Neck: + JVD Vascular: No carotid bruits; Distal pulses 2+ bilaterally Cardiac:  normal S1, S2; RRR; no murmur ----> Irreg. Irreg ( Pt developed Afib after she was initially examined )  Lungs: Crackles in lung bases Abd: soft, nontender, no hepatomegaly  Ext:  no edema Musculoskeletal:  No deformities, BUE and BLE strength normal and equal Skin: warm and dry  Neuro:  CNs 2-12 intact, no focal abnormalities noted Psych:  Normal affect   EKG:  The EKG was personally reviewed and demonstrates: sinus rhythm 88 bpm, PVCs, prolonged QT, atrial enlargement   Telemetry:  Telemetry was personally reviewed and demonstrates: Sinus rhythm-sinus tach, PACs, PVCs  Relevant CV Studies:  Echo: 04/2013  Study Conclusions   - Left ventricle: The cavity size was normal. There was mild    concentric hypertrophy. Systolic function was normal. The    estimated ejection fraction was in the range of 60% to    65%. Wall motion was normal; there were no regional wall    motion abnormalities. Doppler parameters are consistent    with abnormal left ventricular relaxation (grade 1    diastolic dysfunction). The E/e' ratio is >10, suggesting    elevated LV filling pressure.  - Aortic valve: Mildly calcified leaflets. There was no    stenosis. No regurgitation.  - Mitral valve: Mildly thickened leaflets . Mild    regurgitation.  - Tricuspid valve: No significant regurgitation.  - Systemic veins: The IVC was not visualized.  - Pericardium, extracardiac: There was no pericardial    effusion.  Transthoracic echocardiography.  M-mode, complete 2D,  spectral Doppler, and color Doppler.  Height:  Height:  165.1cm. Height: 65in.  Weight:  Weight: 86.8kg. Weight:  191lb.  Body mass index:  BMI: 31.9kg/m^2.  Body surface  area:     BSA: 2.31m2.  Blood pressure:     154/65.  Patient  status:  Inpatient.  Location:  Bedside.   Laboratory Data:  High Sensitivity Troponin:   Recent Labs  Lab 01/04/22 0416 01/04/22 0932 01/04/22 1145  TROPONINIHS 123* 128* 102*     Chemistry Recent Labs  Lab 01/04/22 0006 01/04/22 0039 01/04/22 0416  NA 135 135 136  K 3.9 3.9 4.4  CL 103  --  102  CO2 22  --  23  GLUCOSE 152*  --  103*  BUN 29*  --  27*  CREATININE 1.03*  --  0.89  CALCIUM 8.9  --  8.9  GFRNONAA 52*  --  >60  ANIONGAP 10  --  11    Recent Labs  Lab 01/04/22 0006 01/04/22 0416  PROT 6.1* 5.3*  ALBUMIN 3.6 3.2*  AST 28 23  ALT 30 26  ALKPHOS 36* 29*  BILITOT 0.7 0.7   Lipids No results for input(s): "CHOL", "TRIG", "HDL", "LABVLDL", "LDLCALC", "CHOLHDL" in the last 168 hours.  Hematology Recent Labs  Lab 01/04/22 0006 01/04/22 0039 01/04/22 0416  WBC 8.7  --  8.6  RBC 3.96  --  3.49*  HGB 13.4 13.3 11.6*  HCT 39.3 39.0 35.1*  MCV 99.2  --  100.6*  MCH 33.8  --  33.2  MCHC 34.1  --  33.0  RDW 12.0  --  12.1  PLT 318  --  266   Thyroid No results for input(s): "TSH", "FREET4" in the last 168 hours.  BNP Recent Labs  Lab 01/04/22 0006  BNP 519.6*    DDimer No results for input(s): "DDIMER" in the last 168 hours.   Radiology/Studies:  CT Angio Chest Pulmonary Embolism (PE) W or WO Contrast  Result Date: 01/04/2022 CLINICAL DATA:  Rule out pulmonary embolus. Chest pain. Shortness of breath. EXAM: CT ANGIOGRAPHY CHEST WITH CONTRAST TECHNIQUE: Multidetector CT imaging of the chest was performed using the  standard protocol during bolus administration of intravenous contrast. Multiplanar CT image reconstructions and MIPs were obtained to evaluate the vascular anatomy. RADIATION DOSE REDUCTION: This exam was performed according to the departmental dose-optimization program which includes automated exposure control, adjustment of the mA and/or kV according to patient size and/or use of  iterative reconstruction technique. CONTRAST:  34m OMNIPAQUE IOHEXOL 350 MG/ML SOLN COMPARISON:  None Available. FINDINGS: Cardiovascular: Satisfactory opacification of the pulmonary arteries to the segmental level. No evidence of pulmonary embolism. Normal heart size. Aortic atherosclerotic calcifications and coronary artery calcifications. No pericardial effusion. Mediastinum/Nodes: Thyroid gland, trachea are unremarkable. There is a large hiatal hernia identified which contains at least 50% intrathoracic stomach. Circumferential wall thickening of the distal esophagus just above the hiatal hernia is noted which may reflect esophagitis. No enlarged axillary, supraclavicular, mediastinal or hilar lymph nodes. Lungs/Pleura: Small bilateral pleural effusions are identified. There is diffuse interlobular septal thickening and ground-glass attenuation bilaterally concerning for interstitial edema. Atelectasis is noted within the posterior lower lobes overlying both pleural effusions. There is also atelectasis versus scarring noted within the inferior lingula. A few scattered patchy airspace densities are noted within the posterolateral right upper lobe and superior segment of right lower lobe. Within the medial apical segment of the right upper lobe there is a subpleural nodular density measuring 1.8 by 1.0 by 0.8 cm, image 23/5 and image 76/8. This appears similar to cervical spine CT from 02/14/2021 and is favored to represent an area of pleuroparenchymal scarring. Upper Abdomen: Large stone is identified within the gallbladder measuring 2.5 cm. This is only partially visualized. No acute abnormality. Musculoskeletal: Diffuse osteopenia. Midthoracic spine compression deformity is stable compared with chest radiograph from 06/17/2021. Review of the MIP images confirms the above findings. IMPRESSION: 1. No evidence for acute pulmonary embolism. 2. Diffuse interlobular septal thickening and ground-glass attenuation  bilaterally concerning for interstitial edema. 3. Small bilateral pleural effusions with overlying atelectasis. 4. A few scattered patchy airspace densities are noted within the right lung. Findings may reflect inflammation or infection versus asymmetric areas of alveolar edema. 5. Large hiatal hernia. Circumferential wall thickening of the distal esophagus just above the hiatal hernia may reflect esophagitis. 6. Cholelithiasis. 7. Stable midthoracic spine compression deformity. 8. Coronary artery calcifications. 9. Aortic Atherosclerosis (ICD10-I70.0). Electronically Signed   By: TKerby MoorsM.D.   On: 01/04/2022 10:24   DG Chest Port 1 View  Result Date: 01/04/2022 CLINICAL DATA:  Shortness of breath EXAM: PORTABLE CHEST 1 VIEW COMPARISON:  06/17/2021 FINDINGS: Cardiac shadow is enlarged but stable. Aortic calcifications are again seen. Large hiatal hernia is again noted. The lungs are well aerated bilaterally. Patchy airspace opacity is noted in the left base likely related to acute infiltrate. Mild central vascular congestion is noted. No bony abnormality is seen. IMPRESSION: Central vascular congestion. Left basilar infiltrate consistent with acute pneumonia. Electronically Signed   By: MInez CatalinaM.D.   On: 01/04/2022 00:19     Assessment and Plan:   MDEJANEE THIBEAUXis a 88y.o. female with a hx of hypertension, HFpEF, GERD, hypothyroidism who is being seen 01/04/2022 for the evaluation of CHF at the request of Dr. SVerlon Au  Acute hypoxic respiratory failure HFpEF Acute pulmonary edema  -- Presented with sudden onset dyspnea.  Required BiPAP on admission in the ED.  BNP 514.chest x-ray suggestive of pneumonia. CT angio chest with small bilateral effusions.  She has been weaned from BiPAP now on room air.  She has not been diuresed,  no I's & O's documented. Echo pending -- will start IV lasix '40mg'$  daily  -- follow I&Os, daily weights  Hypertension -- Controlled -- Norvasc 5 mg daily  PTA -- if LVEF is decreased would favor addition of BB therapy  Elevated troponin -- High-sensitivity troponin 123>> 128>> 102.  EKG without ischemic changes, though prolonged QT interval.  Suspect this is demand ischemia in the setting of her acute heart failure, though she has not had an ischemic evaluation, echocardiogram official read pending -- start ASA '81mg'$  daily -- check lipids   Per primary Hypothyroidism GERD  Risk Assessment/Risk Scores:     New York Heart Association (NYHA) Functional Class NYHA Class III   For questions or updates, please contact [place] Please consult [email or web address] for contact info under    Signed, Reino Bellis, NP  01/04/2022 1:08 PM   Attending Note:   The patient was seen and examined.  Agree with assessment and plan as noted above.  Changes made to the above note as needed.  Patient seen and independently examined with Reino Bellis, NP.   We discussed all aspects of the encounter. I agree with the assessment and plan as stated above.    Flash pulmonary edema:   she had sudden shortness of breath that started after lunch yesterday.  Chest x-ray shows bilateral infiltrates.  BNP is elevated.  We started her on some Lasix.  I have ordered albuterol every 6 hours for now. Echocardiogram from December, 2014 shows normal left ventricular systolic function.  She has grade 1 diastolic dysfunction.  There is mild mitral regurgitation.  Prelim echo from today shows moderate LV dysfunction Mid ant. Septal akinesis  . EF ~ 35-40  RV is mildly - moderately dilated    Age 61, + family hx of CAD  I think she has a high chance of having significant CAD At present she is fluid overloaded, actively wheezing  Troponin has peaked at 128 and is trending down.  Will tune her up and then discuss the possibility of doing cath once she is better.   Given her age, she is likely to have diffuse 3 V CAD.  At present, she would not be a  candidate for CABG    2.  Atrial fib:  the patient developed atria fib after I originally saw her. Now has AF with RVR  She is wheezing,  will not tolerate a beta blocker Will start low dose dilt drip     I have spent a total of 40 minutes with patient reviewing hospital  notes , telemetry, EKGs, labs and examining patient as well as establishing an assessment and plan that was discussed with the patient.  > 50% of time was spent in direct patient care.    Thayer Headings, Brooke Bonito., MD, [facility] 01/04/2022, 3:32 PM [address],  [place] Pager [phone]

## 2022-01-04 NOTE — ED Provider Notes (Signed)
[facility] EMERGENCY DEPARTMENT Provider Note   CSN: [id number] Arrival date & time: 01/03/22  2357     History  Chief Complaint- shortness of breath  Level 5 caveat due to acuity of condition Ellen Chandler is a 88 y.o. female.  The history is provided by the patient and the EMS personnel. The history is limited by the condition of the patient.  Shortness of Breath Severity:  Severe Onset quality:  Sudden Timing:  Constant Chronicity:  New  Patient presents from  independent living for shortness of breath.  Which reported patient was feeling short of breath while talking on the phone.  EMS and on their arrival she was unable to speak and was in clear respiratory distress.  She appeared to improve with noninvasive ventilation.  No other details known on arrival    Home Medications Prior to Admission medications   Medication Sig Start Date End Date Taking? Authorizing Provider  acetaminophen (TYLENOL) 500 MG tablet Take 1,000 mg by mouth every 6 (six) hours as needed for moderate pain.    [provider]  amLODipine (NORVASC) 5 MG tablet Take 5 mg by mouth daily. 03/20/16   [provider]  aspirin 81 MG tablet Take 81 mg by mouth daily.    [provider]  levothyroxine (SYNTHROID, LEVOTHROID) 88 MCG tablet Take 88 mcg by mouth daily before breakfast.    [provider]  Multiple Vitamin (MULTIVITAMIN WITH MINERALS) TABS tablet Take 1 tablet by mouth daily.    [provider]  omeprazole (PRILOSEC) 20 MG capsule Take 20 mg by mouth 2 (two) times daily before a meal.     [provider]  oxybutynin (DITROPAN-XL) 5 MG 24 hr tablet Take 5 mg by mouth 2 (two) times daily.    [provider]      Allergies    Patient has no known allergies.    Review of Systems   Review of Systems  Unable to perform ROS: Acuity of condition  Respiratory:  Positive for shortness of breath.     Physical  Exam Updated Vital Signs Pulse 96   Resp (!) 27   SpO2 97%  Physical Exam CONSTITUTIONAL: Elderly, ill-appearing HEAD: Normocephalic/atraumatic EYES: EOMI/PERRL ENMT: CPAP in place NECK: supple no meningeal signs CV: S1/S2 noted LUNGS: Crackles bilaterally, tachypnea noted, distress noted ABDOMEN: soft, nontender NEURO: Pt is awake/alert/appropriate, moves all extremitiesx4.   EXTREMITIES: pulses normal/equal, no significant edema SKIN: warm, color normal PSYCH: Unable to assess  ED Results / Procedures / Treatmen studies Ts   Labs (all labs ordered are listed, but only abnormal results are displayed) Labs Reviewed  COMPREHENSIVE METABOLIC PANEL - Abnormal; Notable for the following components:      Result Value   Glucose, Bld 152 (*)    BUN 29 (*)    Creatinine, Ser 1.03 (*)    Total Protein 6.1 (*)    Alkaline Phosphatase 36 (*)    GFR, Estimated 52 (*)    All other components within normal limits  BRAIN NATRIURETIC PEPTIDE - Abnormal; Notable for the following components:   B Natriuretic Peptide 519.6 (*)    All other components within normal limits  I-STAT VENOUS BLOOD GAS, ED - Abnormal; Notable for the following components:   pO2, Ven 138 (*)    Acid-base deficit 3.0 (*)    Calcium, Ion 1.10 (*)    All other components within normal limits  RESP PANEL BY RT-PCR (FLU A&B, COVID) ARPGX2  CULTURE, BLOOD (ROUTINE X 2)  CULTURE, BLOOD (ROUTINE X 2)  CBC  LACTIC ACID, PLASMA  LACTIC ACID, PLASMA  PROCALCITONIN  CBC  COMPREHENSIVE METABOLIC PANEL  TROPONIN I (HIGH SENSITIVITY)    EKG EKG Interpretation  Date/Time:  Thursday January 04 2022 00:00:04 EDT Ventricular Rate:  88 PR Interval:  211 QRS Duration: 105 QT Interval:  438 QTC Calculation: 524 R Axis:   59 Text Interpretation: Sinus rhythm Ventricular bigeminy Consider left atrial enlargement Probable anterior infarct, age indeterminate Prolonged QT interval Confirmed by Ripley Fraise [phone]) on  01/04/2022 12:03:32 AM Prehospital EKG revealed sinus tachycardia with PVCs Radiology DG Chest Port 1 View  Result Date: 01/04/2022 CLINICAL DATA:  Shortness of breath EXAM: PORTABLE CHEST 1 VIEW COMPARISON:  06/17/2021 FINDINGS: Cardiac shadow is enlarged but stable. Aortic calcifications are again seen. Large hiatal hernia is again noted. The lungs are well aerated bilaterally. Patchy airspace opacity is noted in the left base likely related to acute infiltrate. Mild central vascular congestion is noted. No bony abnormality is seen. IMPRESSION: Central vascular congestion. Left basilar infiltrate consistent with acute pneumonia. Electronically Signed   By: Inez Catalina M.D.   On: 01/04/2022 00:19    Procedures .Critical Care  Performed by: Ripley Fraise, MD Authorized by: Ripley Fraise, MD   Critical care provider statement:    Critical care time (minutes):  120   Critical care start time:  01/04/2022 12:15 AM   Critical care end time:  01/04/2022 2:15 AM   Critical care time was exclusive of:  Separately billable procedures and treating other patients   Critical care was necessary to treat or prevent imminent or life-threatening deterioration of the following conditions:  Respiratory failure, circulatory failure and cardiac failure   Critical care was time spent personally by me on the following activities:  Development of treatment plan with patient or surrogate, examination of patient, evaluation of patient's response to treatment, re-evaluation of patient's condition, pulse oximetry, ordering and review of radiographic studies, ordering and review of laboratory studies, ordering and performing treatments and interventions, discussions with consultants, obtaining history from patient or surrogate and ventilator management   I assumed direction of critical care for this patient from another provider in my specialty: no     Care discussed with: admitting provider   Comments:     Patient  was on noninvasive ventilation, but it began to improve and she has now been placed on nasal cannula     Medications Ordered in ED Medications  azithromycin (ZITHROMAX) 500 mg in sodium chloride 0.9 % 250 mL IVPB (500 mg Intravenous New Bag/Given 01/04/22 0238)  LORazepam (ATIVAN) tablet 1-4 mg (has no administration in time range)    Or  LORazepam (ATIVAN) injection 1-4 mg (has no administration in time range)  thiamine (VITAMIN B1) tablet 100 mg (has no administration in time range)    Or  thiamine (VITAMIN B1) injection 100 mg (has no administration in time range)  folic acid (FOLVITE) tablet 1 mg (has no administration in time range)  multivitamin with minerals tablet 1 tablet (has no administration in time range)  acetaminophen (TYLENOL) tablet 650 mg (has no administration in time range)    Or  acetaminophen (TYLENOL) suppository 650 mg (has no administration in time range)  ondansetron (ZOFRAN) tablet 4 mg (has no administration in time range)    Or  ondansetron (ZOFRAN) injection 4 mg (has no administration in time range)  enoxaparin (LOVENOX) injection 40 mg (has no administration in  time range)  cefTRIAXone (ROCEPHIN) 1 g in sodium chloride 0.9 % 100 mL IVPB (0 g Intravenous Stopped 01/04/22 0224)    ED Course/ Medical Decision Making/ A&P Clinical Course as of 01/04/22 0311  Thu Jan 04, 2022  0054 Patient overall feeling improved.  Her work of breathing is improved.  She does report recent cough and congestion but thought was allergies.  X-rays consistent with pneumonia as well as some edema.  Discussed with daughter-in-law at bedside, she was the one that spoke to her on the phone and she appeared out of breath and had her checked on [facility] the [facility] home [DW]  0107 Glucose(!): 152 Hyperglycemia [DW]  0210 Patient feeling improved, will trial off BiPap [DW]  0303 Patient felt somewhat improved, therefore she was try off BiPAP.  She is currently stable from a respiratory  state.  Consulted [facility] hospitalist for admission [DW]  0311 History and exam more consistent with respiratory/infectious etiology.  Patient's been stabilized in the emergency department [DW]    Clinical Course User Index [DW] Ripley Fraise, MD                           Medical Decision Making Amount and/or Complexity of Data Reviewed Labs: ordered. Decision-making details documented in ED Course. Radiology: ordered.  Risk Decision regarding hospitalization.   This patient presents to the ED for concern of shortness of breath, this involves an extensive number of treatment options, and is a complaint that carries with it a high risk of complications and morbidity.  The differential diagnosis includes but is not limited to Acute coronary syndrome, pneumonia, acute pulmonary edema, pneumothorax, acute anemia, pulmonary embolism    Comorbidities that complicate the patient evaluation: Patient's presentation is complicated by their history of hypertension  Social Determinants of Health: Indeterminate social determinants of health this patient is currently on noninvasive ventilation  Additional history obtained: Additional history obtained from EMS discussed with paramedics at bedside Records reviewed  previous echocardiogram reviewed  Lab Tests: I Ordered, and personally interpreted labs.  The pertinent results include: Hyperglycemia  Imaging Studies ordered: I ordered imaging studies including X-ray chest   I independently visualized and interpreted imaging which showed pneumonia I agree with the radiologist interpretation  Cardiac Monitoring: The patient was maintained on a cardiac monitor.  I personally viewed and interpreted the cardiac monitor which showed an underlying rhythm of:  sinus tachycardia  Medicines ordered and prescription drug management: I ordered medication including Rocephin and azithromycin for pneumonia Reevaluation of the patient after these medicines  showed that the patient    stayed the same   Critical Interventions:  IV antibiotics and noninvasive ventilation  Consultations Obtained: I requested consultation with the admitting physician Dr. Alcario Drought , and discussed  findings as well as pertinent plan - they recommend: Admit  Reevaluation: After the interventions noted above, I reevaluated the patient and found that they have :improved  Complexity of problems addressed: Patient's presentation is most consistent with  acute presentation with potential threat to life or bodily function  Disposition: After consideration of the diagnostic results and the patient's response to treatment,  I feel that the patent would benefit from admission   .           Final Clinical Impression(s) / ED Diagnoses Final diagnoses:  Acute respiratory failure with hypoxia ([place])  Community acquired pneumonia of left lower lobe of lung    Rx / DC Orders ED Discharge  Orders     None         Ripley Fraise, MD 01/04/22 [phone]

## 2022-01-04 NOTE — Assessment & Plan Note (Addendum)
Pt with history of EtOH use/abuse, ED visit(s) noted for intoxication within the past 1 yr. 1. Putting on CIWA for withdrawal prevention.

## 2022-01-04 NOTE — ED Notes (Signed)
Pr off by-pap

## 2022-01-04 NOTE — ED Notes (Signed)
Portable chest x-ray complete.

## 2022-01-04 NOTE — Assessment & Plan Note (Addendum)
DDx = CAP vs COVID vs CHF vs MI. CXR looks like CAP No fever, no WBC. COVID and flu pending BNP elevation + rapid improvement on BIPAP (weaned off of BIPAP to 2L via [place] now) more suspicious for CHF. Sudden onset + CP with activity + EKG findings, concerning for MI. 1. Got rocephin + azithro in ED 2. COVID and flu pending 1. Order COVID pathway (remdesivir + steroids) if COVID positive 3. Procalcitonin pending 1. Order PNA pathway and reorder ABx if procalcitonin positive, or if WBC elevates 4. Consider ordering CHF pathway and diuretics if procalcitonin neg and COVID neg 5. NPO for the moment, at least until we see if she needs to go back on BIPAP or not. 6. CP resolved at the moment, does have ST depressions in lateral leads that appear new / changed since Oct 2022. 1. Check trop 2. Ordering ASA 325 now 3. Order ACS pathway and cards consult if trop elevated. 7. Tele monitor

## 2022-01-04 NOTE — ED Notes (Signed)
ED TO INPATIENT HANDOFF REPORT  ED Nurse Name and Phone #: Teodoro Spray RN [phone]  S Name/Age/Gender Ellen Chandler 88 y.o. female Room/Bed: 007C/007C  Code Status   Code Status: Full Code  Home/SNF/Other  Patient oriented to: self, place, and situation Is this baseline? Yes   Triage Complete: Triage complete  Chief Complaint Acute respiratory failure with hypoxia ([place]) [J96.01]  Triage Note The pt arrived by gems from an assisted place  she was on the phone with her daughter when the daughter heard a noise and her mother was not speaking any longer  she called ems and the  pt opens her eyes when her name is called  otherwise she keeps both her eyes c.osed  iv ny gems infiltrated.  Iv placed by [employer]  The pt came from the  [facility]   Allergies No Known Allergies  Level of Care/Admitting Diagnosis ED Disposition     ED Disposition  [place]: [place] [100100]  Level of Care: Progressive [102]  Admit to Progressive based on following criteria: RESPIRATORY PROBLEMS hypoxemic/hypercapnic respiratory failure that is responsive to NIPPV (BiPAP) or High Flow Nasal Cannula (6-80 lpm). Frequent assessment/intervention, no > Q2 hrs < Q4 hrs, to maintain oxygenation and pulmonary hygiene.  May place patient in observation at [facility] or [place] if equivalent level of care is available:: No  Covid Evaluation: Symptomatic Person Under Investigation (PUI) or recent exposure (last 10 days) *Testing Required*  Diagnosis: Acute respiratory failure with hypoxia [facility]) [[id number]]  Admitting Physician: Etta Quill [phone]  Attending Physician: Etta Quill [phone]          B Medical/Surgery History Past Medical History:  Diagnosis Date   Abnormal ECG    a. Pt aware of long hx of abnl ecg->h/o normal stress testing in [place] ~ 2000.   Diastolic dysfunction    a. 04/2013 Echo: EF 60-65%, No rwma, Gr 1  DD, mild MR.   GERD (gastroesophageal reflux disease)    Hypertension    Hypothyroidism    Past Surgical History:  Procedure Laterality Date   ABDOMINAL HYSTERECTOMY     JOINT REPLACEMENT     a. knee left - injury resulted after being attacked by a dog.     A IV Location/Drains/Wounds Patient Lines/Drains/Airways Status     Active Line/Drains/Airways     Name Placement date Placement time Site Days   Peripheral IV 01/04/22 20 G Left Antecubital 01/04/22  0022  Antecubital  less than 1   Peripheral IV 01/04/22 20 G Left Forearm 01/04/22  0200  Forearm  less than 1   External Urinary Catheter 01/04/22  0925  --  less than 1            Intake/Output Last 24 hours No intake or output data in the 24 hours ending 01/04/22 1437  Labs/Imaging Results for orders placed or performed during the hospital encounter of 01/03/22 (from the past 48 hour(s))  Comprehensive metabolic panel     Status: Abnormal   Collection Time: 01/04/22 12:06 AM  Result Value Ref Range   Sodium 135 135 - 145 mmol/L   Potassium 3.9 3.5 - 5.1 mmol/L   Chloride 103 98 - 111 mmol/L   CO2 22 22 - 32 mmol/L   Glucose, Bld 152 (H) 70 - 99 mg/dL    Comment: Glucose reference range applies only to samples taken after fasting  for at least 8 hours.   BUN 29 (H) 8 - 23 mg/dL   Creatinine, Ser 1.03 (H) 0.44 - 1.00 mg/dL   Calcium 8.9 8.9 - 10.3 mg/dL   Total Protein 6.1 (L) 6.5 - 8.1 g/dL   Albumin 3.6 3.5 - 5.0 g/dL   AST 28 15 - 41 U/L   ALT 30 0 - 44 U/L   Alkaline Phosphatase 36 (L) 38 - 126 U/L   Total Bilirubin 0.7 0.3 - 1.2 mg/dL   GFR, Estimated 52 (L) >60 mL/min    Comment: (NOTE) Calculated using the CKD-EPI Creatinine Equation (2021)    Anion gap 10 5 - 15    Comment: Performed at [facility], [place] [address], [place], [place] [address]  CBC     Status: None   Collection Time: 01/04/22 12:06 AM  Result Value Ref Range   WBC 8.7 4.0 - 10.5 K/uL   RBC 3.96 3.87 - 5.11 MIL/uL    Hemoglobin 13.4 12.0 - 15.0 g/dL   HCT 39.3 36.0 - 46.0 %   MCV 99.2 80.0 - 100.0 fL   MCH 33.8 26.0 - 34.0 pg   MCHC 34.1 30.0 - 36.0 g/dL   RDW 12.0 11.5 - 15.5 %   Platelets 318 150 - 400 K/uL   nRBC 0.0 0.0 - 0.2 %    Comment: Performed at [facility], [place] [address] [place], [place] [address]  Brain natriuretic peptide     Status: Abnormal   Collection Time: 01/04/22 12:06 AM  Result Value Ref Range   B Natriuretic Peptide 519.6 (H) 0.0 - 100.0 pg/mL    Comment: Performed at [facility] [address] [place], [place] [address]  I-Stat venous blood gas, ED     Status: Abnormal   Collection Time: 01/04/22 12:39 AM  Result Value Ref Range   pH, Ven 7.331 7.25 - 7.43   pCO2, Ven 44.5 44 - 60 mmHg   pO2, Ven 138 (H) 32 - 45 mmHg   Bicarbonate 23.5 20.0 - 28.0 mmol/L   TCO2 25 22 - 32 mmol/L   O2 Saturation 99 %   Acid-base deficit 3.0 (H) 0.0 - 2.0 mmol/L   Sodium 135 135 - 145 mmol/L   Potassium 3.9 3.5 - 5.1 mmol/L   Calcium, Ion 1.10 (L) 1.15 - 1.40 mmol/L   HCT 39.0 36.0 - 46.0 %   Hemoglobin 13.3 12.0 - 15.0 g/dL   Sample type VENOUS   Lactic acid, plasma     Status: None   Collection Time: 01/04/22  1:45 AM  Result Value Ref Range   Lactic Acid, Venous 1.7 0.5 - 1.9 mmol/L    Comment: Performed at [facility] [address], [place], [place] [address]  Blood Culture (routine x 2)     Status: None (Preliminary result)   Collection Time: 01/04/22  1:45 AM   Specimen: BLOOD  Result Value Ref Range   Specimen Description BLOOD LEFT ANTECUBITAL    Special Requests      BOTTLES DRAWN AEROBIC AND ANAEROBIC Blood Culture adequate volume   Culture      NO GROWTH < 12 HOURS Performed at [facility], [place] [address] [place], [place] [address]    Report Status PENDING   Blood Culture (routine x 2)     Status: None (Preliminary result)   Collection Time: 01/04/22  1:49 AM   Specimen: BLOOD LEFT WRIST  Result  Value Ref Range   Specimen  Description BLOOD LEFT WRIST    Special Requests      BOTTLES DRAWN AEROBIC AND ANAEROBIC Blood Culture adequate volume   Culture      NO GROWTH < 12 HOURS Performed at [facility] [address], [place], [place] [address]    Report Status PENDING   Resp Panel by RT-PCR (Flu A&B, Covid) Anterior Nasal Swab     Status: None   Collection Time: 01/04/22  2:25 AM   Specimen: Anterior Nasal Swab  Result Value Ref Range   SARS Coronavirus 2 by RT PCR NEGATIVE NEGATIVE    Comment: (NOTE) SARS-CoV-2 target nucleic acids are NOT DETECTED.  The SARS-CoV-2 RNA is generally detectable in upper respiratory specimens during the acute phase of infection. The lowest concentration of SARS-CoV-2 viral copies this assay can detect is 138 copies/mL. A negative result does not preclude SARS-Cov-2 infection and should not be used as the sole basis for treatment or other patient management decisions. A negative result may occur with  improper specimen collection/handling, submission of specimen other than nasopharyngeal swab, presence of viral mutation(s) within the areas targeted by this assay, and inadequate number of viral copies(<138 copies/mL). A negative result must be combined with clinical observations, patient history, and epidemiological information. The expected result is Negative.  Fact Sheet for Patients:  EntrepreneurPulse.com.au  Fact Sheet for Healthcare Providers:  IncredibleEmployment.be  This test is no t yet approved or cleared by the [place] FDA and  has been authorized for detection and/or diagnosis of SARS-CoV-2 by FDA under an Emergency Use Authorization (EUA). This EUA will remain  in effect (meaning this test can be used) for the duration of the COVID-19 declaration under Section 564(b)(1) of the Act, 21 U.S.C.section 360bbb-3(b)(1), unless the authorization is terminated  or revoked sooner.       Influenza A by PCR  NEGATIVE NEGATIVE   Influenza B by PCR NEGATIVE NEGATIVE    Comment: (NOTE) The Xpert Xpress SARS-CoV-2/FLU/RSV plus assay is intended as an aid in the diagnosis of influenza from Nasopharyngeal swab specimens and should not be used as a sole basis for treatment. Nasal washings and aspirates are unacceptable for Xpert Xpress SARS-CoV-2/FLU/RSV testing.  Fact Sheet for Patients: EntrepreneurPulse.com.au  Fact Sheet for Healthcare Providers: IncredibleEmployment.be  This test is not yet approved or cleared by the [place] FDA and has been authorized for detection and/or diagnosis of SARS-CoV-2 by FDA under an Emergency Use Authorization (EUA). This EUA will remain in effect (meaning this test can be used) for the duration of the COVID-19 declaration under Section 564(b)(1) of the Act, 21 U.S.C. section 360bbb-3(b)(1), unless the authorization is terminated or revoked.  Performed at [facility], [place] [address] [place], [place] [address]   Lactic acid, plasma     Status: None   Collection Time: 01/04/22  4:16 AM  Result Value Ref Range   Lactic Acid, Venous 0.8 0.5 - 1.9 mmol/L    Comment: Performed at [facility] [address], [place], [place] [address]  Procalcitonin - Baseline     Status: None   Collection Time: 01/04/22  4:16 AM  Result Value Ref Range   Procalcitonin <0.10 ng/mL    Comment:        Interpretation: PCT (Procalcitonin) <= 0.5 ng/mL: Systemic infection (sepsis) is not likely. Local bacterial infection is possible. (NOTE)       Sepsis PCT Algorithm  Lower Respiratory Tract                                      Infection PCT Algorithm    ----------------------------     ----------------------------         PCT < 0.25 ng/mL                PCT < 0.10 ng/mL          Strongly encourage             Strongly discourage   discontinuation of antibiotics    initiation of antibiotics     ----------------------------     -----------------------------       PCT 0.25 - 0.50 ng/mL            PCT 0.10 - 0.25 ng/mL               OR       >80% decrease in PCT            Discourage initiation of                                            antibiotics      Encourage discontinuation           of antibiotics    ----------------------------     -----------------------------         PCT >= 0.50 ng/mL              PCT 0.26 - 0.50 ng/mL               AND        <80% decrease in PCT             Encourage initiation of                                             antibiotics       Encourage continuation           of antibiotics    ----------------------------     -----------------------------        PCT >= 0.50 ng/mL                  PCT > 0.50 ng/mL               AND         increase in PCT                  Strongly encourage                                      initiation of antibiotics    Strongly encourage escalation           of antibiotics                                     -----------------------------  PCT <= 0.25 ng/mL                                                 OR                                        > 80% decrease in PCT                                      Discontinue / Do not initiate                                             antibiotics  Performed at [facility], [place] [address], [place], [place] [address]   CBC     Status: Abnormal   Collection Time: 01/04/22  4:16 AM  Result Value Ref Range   WBC 8.6 4.0 - 10.5 K/uL   RBC 3.49 (L) 3.87 - 5.11 MIL/uL   Hemoglobin 11.6 (L) 12.0 - 15.0 g/dL   HCT 35.1 (L) 36.0 - 46.0 %   MCV 100.6 (H) 80.0 - 100.0 fL   MCH 33.2 26.0 - 34.0 pg   MCHC 33.0 30.0 - 36.0 g/dL   RDW 12.1 11.5 - 15.5 %   Platelets 266 150 - 400 K/uL   nRBC 0.0 0.0 - 0.2 %    Comment: Performed at [facility], [place] [address] [place], [place] [address]  Comprehensive metabolic panel      Status: Abnormal   Collection Time: 01/04/22  4:16 AM  Result Value Ref Range   Sodium 136 135 - 145 mmol/L   Potassium 4.4 3.5 - 5.1 mmol/L   Chloride 102 98 - 111 mmol/L   CO2 23 22 - 32 mmol/L   Glucose, Bld 103 (H) 70 - 99 mg/dL    Comment: Glucose reference range applies only to samples taken after fasting for at least 8 hours.   BUN 27 (H) 8 - 23 mg/dL   Creatinine, Ser 0.89 0.44 - 1.00 mg/dL   Calcium 8.9 8.9 - 10.3 mg/dL   Total Protein 5.3 (L) 6.5 - 8.1 g/dL   Albumin 3.2 (L) 3.5 - 5.0 g/dL   AST 23 15 - 41 U/L   ALT 26 0 - 44 U/L   Alkaline Phosphatase 29 (L) 38 - 126 U/L   Total Bilirubin 0.7 0.3 - 1.2 mg/dL   GFR, Estimated >60 >60 mL/min    Comment: (NOTE) Calculated using the CKD-EPI Creatinine Equation (2021)    Anion gap 11 5 - 15    Comment: Performed at [facility], [place] [address] [place], [place] [address]  Troponin I (High Sensitivity)     Status: Abnormal   Collection Time: 01/04/22  4:16 AM  Result Value Ref Range   Troponin I (High Sensitivity) 123 (HH) <18 ng/L    Comment: CRITICAL RESULT CALLED TO, READ BACK BY AND VERIFIED WITH C. CHRISCO RN, 0530, 01/04/22, EADEDOKUN (NOTE) Elevated high sensitivity troponin I (hsTnI) values and significant  changes across serial measurements  may suggest ACS but many other  chronic and acute conditions are known to elevate hsTnI results.  Refer to the "Links" section for chest pain algorithms and additional  guidance. Performed at [facility], [place] [address] [place], [place] [address]   Troponin I (High Sensitivity)     Status: Abnormal   Collection Time: 01/04/22  9:32 AM  Result Value Ref Range   Troponin I (High Sensitivity) 128 (HH) <18 ng/L    Comment: CRITICAL VALUE NOTED. VALUE IS CONSISTENT WITH PREVIOUSLY REPORTED/CALLED VALUE (NOTE) Elevated high sensitivity troponin I (hsTnI) values and significant  changes across serial measurements may suggest ACS but many other  chronic and acute  conditions are known to elevate hsTnI results.  Refer to the "Links" section for chest pain algorithms and additional  guidance. Performed at [facility], [place] [address] [place], [place] [address]   Troponin I (High Sensitivity)     Status: Abnormal   Collection Time: 01/04/22 11:45 AM  Result Value Ref Range   Troponin I (High Sensitivity) 102 (HH) <18 ng/L    Comment: CRITICAL VALUE NOTED. VALUE IS CONSISTENT WITH PREVIOUSLY REPORTED/CALLED VALUE (NOTE) Elevated high sensitivity troponin I (hsTnI) values and significant  changes across serial measurements may suggest ACS but many other  chronic and acute conditions are known to elevate hsTnI results.  Refer to the "Links" section for chest pain algorithms and additional  guidance. Performed at [facility], [place] [address], [place], [place] [address]    CT Angio Chest Pulmonary Embolism (PE) W or WO Contrast  Result Date: 01/04/2022 CLINICAL DATA:  Rule out pulmonary embolus. Chest pain. Shortness of breath. EXAM: CT ANGIOGRAPHY CHEST WITH CONTRAST TECHNIQUE: Multidetector CT imaging of the chest was performed using the standard protocol during bolus administration of intravenous contrast. Multiplanar CT image reconstructions and MIPs were obtained to evaluate the vascular anatomy. RADIATION DOSE REDUCTION: This exam was performed according to the departmental dose-optimization program which includes automated exposure control, adjustment of the mA and/or kV according to patient size and/or use of iterative reconstruction technique. CONTRAST:  52m OMNIPAQUE IOHEXOL 350 MG/ML SOLN COMPARISON:  None Available. FINDINGS: Cardiovascular: Satisfactory opacification of the pulmonary arteries to the segmental level. No evidence of pulmonary embolism. Normal heart size. Aortic atherosclerotic calcifications and coronary artery calcifications. No pericardial effusion. Mediastinum/Nodes: Thyroid gland, trachea are unremarkable. There is  a large hiatal hernia identified which contains at least 50% intrathoracic stomach. Circumferential wall thickening of the distal esophagus just above the hiatal hernia is noted which may reflect esophagitis. No enlarged axillary, supraclavicular, mediastinal or hilar lymph nodes. Lungs/Pleura: Small bilateral pleural effusions are identified. There is diffuse interlobular septal thickening and ground-glass attenuation bilaterally concerning for interstitial edema. Atelectasis is noted within the posterior lower lobes overlying both pleural effusions. There is also atelectasis versus scarring noted within the inferior lingula. A few scattered patchy airspace densities are noted within the posterolateral right upper lobe and superior segment of right lower lobe. Within the medial apical segment of the right upper lobe there is a subpleural nodular density measuring 1.8 by 1.0 by 0.8 cm, image 23/5 and image 76/8. This appears similar to cervical spine CT from 02/14/2021 and is favored to represent an area of pleuroparenchymal scarring. Upper Abdomen: Large stone is identified within the gallbladder measuring 2.5 cm. This is only partially visualized. No acute abnormality. Musculoskeletal: Diffuse osteopenia. Midthoracic spine compression deformity is stable compared with chest radiograph from 06/17/2021. Review of  the MIP images confirms the above findings. IMPRESSION: 1. No evidence for acute pulmonary embolism. 2. Diffuse interlobular septal thickening and ground-glass attenuation bilaterally concerning for interstitial edema. 3. Small bilateral pleural effusions with overlying atelectasis. 4. A few scattered patchy airspace densities are noted within the right lung. Findings may reflect inflammation or infection versus asymmetric areas of alveolar edema. 5. Large hiatal hernia. Circumferential wall thickening of the distal esophagus just above the hiatal hernia may reflect esophagitis. 6. Cholelithiasis. 7. Stable  midthoracic spine compression deformity. 8. Coronary artery calcifications. 9. Aortic Atherosclerosis (ICD10-I70.0). Electronically Signed   By: Kerby Moors M.D.   On: 01/04/2022 10:24   DG Chest Port 1 View  Result Date: 01/04/2022 CLINICAL DATA:  Shortness of breath EXAM: PORTABLE CHEST 1 VIEW COMPARISON:  06/17/2021 FINDINGS: Cardiac shadow is enlarged but stable. Aortic calcifications are again seen. Large hiatal hernia is again noted. The lungs are well aerated bilaterally. Patchy airspace opacity is noted in the left base likely related to acute infiltrate. Mild central vascular congestion is noted. No bony abnormality is seen. IMPRESSION: Central vascular congestion. Left basilar infiltrate consistent with acute pneumonia. Electronically Signed   By: Inez Catalina M.D.   On: 01/04/2022 00:19    Pending Labs Unresulted Labs (From admission, onward)     Start     Ordered   01/04/22 0500  Procalcitonin  Daily at 5am,   R      01/04/22 0258            Vitals/Pain Today's Vitals   01/04/22 1130 01/04/22 1230 01/04/22 1245 01/04/22 1408  BP: 129/82 125/70 (!) 140/73 (!) 140/73  Pulse: 85 71 76 76  Resp: '20 14 17   '$ Temp:    98 F (36.7 C)  TempSrc:    Oral  SpO2: 95% 97% 97%   Weight:      Height:      PainSc:        Isolation Precautions No active isolations  Medications Medications  LORazepam (ATIVAN) tablet 1-4 mg (has no administration in time range)    Or  LORazepam (ATIVAN) injection 1-4 mg (has no administration in time range)  thiamine (VITAMIN B1) tablet 100 mg (100 mg Oral Given 01/04/22 0919)    Or  thiamine (VITAMIN B1) injection 100 mg ( Intravenous See Alternative 07/27/53 7322)  folic acid (FOLVITE) tablet 1 mg (1 mg Oral Given 01/04/22 0919)  multivitamin with minerals tablet 1 tablet (1 tablet Oral Given 01/04/22 0920)  acetaminophen (TYLENOL) tablet 650 mg (has no administration in time range)    Or  acetaminophen (TYLENOL) suppository 650 mg (has no  administration in time range)  ondansetron (ZOFRAN) tablet 4 mg (has no administration in time range)    Or  ondansetron (ZOFRAN) injection 4 mg (has no administration in time range)  enoxaparin (LOVENOX) injection 40 mg (40 mg Subcutaneous Given 01/04/22 0922)  aspirin chewable tablet 325 mg (has no administration in time range)  cefTRIAXone (ROCEPHIN) 1 g in sodium chloride 0.9 % 100 mL IVPB (0 g Intravenous Stopped 01/04/22 0224)  azithromycin (ZITHROMAX) 500 mg in sodium chloride 0.9 % 250 mL IVPB (0 mg Intravenous Stopped 01/04/22 0432)  aspirin tablet 325 mg (325 mg Oral Given 01/04/22 0608)  iohexol (OMNIPAQUE) 350 MG/ML injection 50 mL (50 mLs Intravenous Contrast Given 01/04/22 1011)    Mobility walks with device Moderate fall risk   Focused Assessments Pulmonary Assessment Handoff:  Lung sounds: Bilateral Breath Sounds: Clear, Diminished O2 Device:  Nasal Cannula O2 Flow Rate (L/min): 2 L/min    R Recommendations: See Admitting Provider Note  Report given to:   Additional Notes:

## 2022-01-04 NOTE — Progress Notes (Signed)
ANTICOAGULATION CONSULT NOTE - Initial Consult  Pharmacy Consult for heparin  Indication: chest pain/ACS and atrial fibrillation  No Known Allergies  Patient Measurements: Height: '5\' 4"'$  (162.6 cm) Weight: 61 kg (134 lb 7.7 oz) IBW/kg (Calculated) : 54.7 Heparin Dosing Weight: 61 kg  Vital Signs: Temp: 98.1 F (36.7 C) (08/31 1556) Temp Source: Oral (08/31 1556) BP: 132/63 (08/31 1600) Pulse Rate: 74 (08/31 1600)  Labs: Recent Labs    01/04/22 0006 01/04/22 0039 01/04/22 0416 01/04/22 0932 01/04/22 1145  HGB 13.4 13.3 11.6*  --   --   HCT 39.3 39.0 35.1*  --   --   PLT 318  --  266  --   --   CREATININE 1.03*  --  0.89  --   --   TROPONINIHS  --   --  123* 128* 102*    Estimated Creatinine Clearance: 37.7 mL/min (by C-G formula based on SCr of 0.89 mg/dL).   Medical History: Past Medical History:  Diagnosis Date   Abnormal ECG    a. Pt aware of long hx of abnl ecg->h/o normal stress testing in [place] ~ 2000.   Diastolic dysfunction    a. 04/2013 Echo: EF 60-65%, No rwma, Gr 1 DD, mild MR.   GERD (gastroesophageal reflux disease)    Hypertension    Hypothyroidism     Medications:  Scheduled:   albuterol  2.5 mg Nebulization Q6H   [START ON 01/05/2022] aspirin EC  81 mg Oral Daily   diltiazem  10 mg Intravenous Once   enoxaparin (LOVENOX) injection  40 mg Subcutaneous P32R   folic acid  1 mg Oral Daily   furosemide  40 mg Intravenous Daily   multivitamin with minerals  1 tablet Oral Daily   thiamine  100 mg Oral Daily   Or   thiamine  100 mg Intravenous Daily    Assessment: 88 yo F with PMH HFpEF presents with trouble breathing. Pt does not take anticoagulants at home according to completed med history or recent fill history. Pharmacy is consulted to dose heparin with indication of afib + ACS  Hgb 11.6/Plt 266  Goal of Therapy:  Heparin level 0.3-0.7 units/ml Monitor platelets by anticoagulation protocol: Yes   Plan:  Heparin 3000 units x1  as bolus followed by  Heparin 750 units/hr  Heparin level in 8hrs at 9/1 '@0300'$  . Monitor CBC, HL, s/sx bleeding and transition to oral therapies as appropriate.    Wilson Singer, PharmD Clinical Pharmacist 01/04/2022 6:16 PM

## 2022-01-04 NOTE — Progress Notes (Signed)
Patient transferred to [facility] bed 21 with belongings .Patient verbalized that she will notify her family members of transfer.No acute distress noted.

## 2022-01-04 NOTE — Assessment & Plan Note (Signed)
Unclear if acute decompensation today causing SOB presentation: CXR looks more like PNA per radiologist though BNP elevated, no fever, no WBC. 1. Tele monitor

## 2022-01-04 NOTE — ED Notes (Signed)
Pt transported to CT ?

## 2022-01-04 NOTE — Progress Notes (Signed)
Patient seen and examined and agree with plan of care as per Alcario Drought who saw the patient earlier today  30 Y female independent living facility resident HFpEF no CAD HTN with complications of hypotension in the past HLD hypothyroid reflux DM TY 2 Known ethanol habituation in the past  Was talking on daughter over the phone and patient became short of breath associated with chest pain NAD placed on NIPPV  BNP noted to be elevated, troponin 123 CXR suggestive of PNA but on my over read not convinced there does seem to be some increased interstitial opacities on the right side BUNs/creatinine 29/1.03--> 27/0.89  COVID is negative--because of chest pain EKG my over read PR interval 0.20 QRS axis about 45 degrees, some subtle ST depressions in V1 through V4  Cardiology has been consulted to see the patient  S No further chest pain at this time-shortness of breath seems a little better  O/e BP 132/81   Pulse 81   Temp 97.8 F (36.6 C) (Temporal)   Resp 16   Ht '5\' 4"'$  (1.626 m)   Wt 61 kg   SpO2 95%   BMI 23.08 kg/m  Awake coherent no distress S1-S2 PVCs on monitors Chest is clear no wheeze no rales no rhonchi I cannot appreciate JVD Abdomen is soft No lower extremity  Plan   Given sudden onset with also get CT chest to rule out PE  cardiology consulted to see the patient Give aspirin 325 mg now, give metoprolol 12.5 twice daily now, cycle troponin, repeat EKG and keep n.p.o.  Will contact cardiology for more emergent follow-up etc. if patient becomes more symptomatic-at this time as chest pain-free would hold heparin as well as nitroglycerin  Did call the patient's son [name] [phone] and updated him  Verneita Griffes, MD [facility] 9:47 AM

## 2022-01-04 NOTE — ED Triage Notes (Signed)
The pt arrived by [facility] from an assisted place  she was on the phone with her daughter when the daughter heard a noise and her mother was not speaking any longer  she called ems and the  pt opens her eyes when her name is called  otherwise she keeps both her eyes c.osed  iv ny gems infiltrated.  Iv placed by [employer]

## 2022-01-04 NOTE — H&P (Addendum)
History and Physical    Patient: Ellen Chandler WLK:[id number] DOB: April 21, 1931 DOA: 01/03/2022 DOS: the patient was seen and examined on 01/04/2022 PCP: Deland Pretty, MD  Patient coming from: ALF/ILF  Chief Complaint:  Chief Complaint  Patient presents with   Shortness of Breath   HPI: Ellen Chandler is a 88 y.o. female with medical history significant of dCHF, HTN, hypothyroidism, EtOH abuse that appears to be fairly recent (ED visits in Oct 2022 and Dec 2021 for intoxication).  Pt presents to ED with SOB.  Has had cough recently that she thought was allergies.  Today had more sudden onset of severe SOB.  Pt also had CP with activity earlier today and with onset of SOB.  CP resolved at this time though still has SOB.  EMS called, pt in respiratory distress, put on NIPPV and brought in to ED.  In ED pt improved on NIPPV.  BNP elevated.  Denies CP.  CXR suggestive of PNA.    Review of Systems: As mentioned in the history of present illness. All other systems reviewed and are negative. Past Medical History:  Diagnosis Date   Abnormal ECG    a. Pt aware of long hx of abnl ecg->h/o normal stress testing in [place] ~ 2000.   Diastolic dysfunction    a. 04/2013 Echo: EF 60-65%, No rwma, Gr 1 DD, mild MR.   GERD (gastroesophageal reflux disease)    Hypertension    Hypothyroidism    Past Surgical History:  Procedure Laterality Date   ABDOMINAL HYSTERECTOMY     JOINT REPLACEMENT     a. knee left - injury resulted after being attacked by a dog.   Social History:  reports that she has never smoked. She has never used smokeless tobacco. She reports current alcohol use. She reports that she does not use drugs.  No Known Allergies  Family History  Problem Relation Age of Onset   Heart attack Mother        died @ 18.   Diabetes type II Father        died @ 19.   Breast cancer Sister        died @ 7.    Prior to Admission medications   Medication Sig Start  Date End Date Taking? Authorizing Provider  acetaminophen (TYLENOL) 500 MG tablet Take 1,000 mg by mouth every 6 (six) hours as needed for moderate pain.    [provider]  amLODipine (NORVASC) 5 MG tablet Take 5 mg by mouth daily. 03/20/16   [provider]  aspirin 81 MG tablet Take 81 mg by mouth daily.    [provider]  levothyroxine (SYNTHROID, LEVOTHROID) 88 MCG tablet Take 88 mcg by mouth daily before breakfast.    [provider]  Multiple Vitamin (MULTIVITAMIN WITH MINERALS) TABS tablet Take 1 tablet by mouth daily.    [provider]  omeprazole (PRILOSEC) 20 MG capsule Take 20 mg by mouth 2 (two) times daily before a meal.     [provider]  oxybutynin (DITROPAN-XL) 5 MG 24 hr tablet Take 5 mg by mouth 2 (two) times daily.    [provider]    Physical Exam: Vitals:   01/04/22 0103 01/04/22 0200 01/04/22 0216 01/04/22 0237  BP:  (!) 108/59    Pulse:  71 70   Resp:  16 19   Temp: (!) 97 F (36.1 C)   97.7 F (36.5 C)  TempSrc: Rectal   Oral  SpO2:  100% 100%   Weight:      Height:       Constitutional: NAD, calm, comfortable Eyes: PERRL, lids and conjunctivae normal ENMT: Mucous membranes are moist. Posterior pharynx clear of any exudate or lesions.Normal dentition.  Neck: normal, supple, no masses, no thyromegaly Respiratory: B crackles Cardiovascular: Regular rate and rhythm, no murmurs / rubs / gallops. No extremity edema. 2+ pedal pulses. No carotid bruits.  Abdomen: no tenderness, no masses palpated. No hepatosplenomegaly. Bowel sounds positive.  Musculoskeletal: no clubbing / cyanosis. No joint deformity upper and lower extremities. Good ROM, no contractures. Normal muscle tone.  Skin: no rashes, lesions, ulcers. No induration Neurologic: CN 2-12 grossly intact. Sensation intact, DTR normal. Strength 5/5 in all 4.  Psychiatric: Normal judgment and insight. Alert and oriented x 3. Normal mood.    Data Reviewed:    BNP 519  CBC    Component Value Date/Time   WBC 8.7 01/04/2022 0006   RBC 3.96 01/04/2022 0006   HGB 13.3 01/04/2022 0039   HCT 39.0 01/04/2022 0039   PLT 318 01/04/2022 0006   MCV 99.2 01/04/2022 0006   MCH 33.8 01/04/2022 0006   MCHC 34.1 01/04/2022 0006   RDW 12.0 01/04/2022 0006   LYMPHSABS 1.5 06/17/2021 1117   MONOABS 0.6 06/17/2021 1117   EOSABS 0.1 06/17/2021 1117   BASOSABS 0.0 06/17/2021 1117   CMP     Component Value Date/Time   NA 135 01/04/2022 0039   K 3.9 01/04/2022 0039   CL 103 01/04/2022 0006   CO2 22 01/04/2022 0006   GLUCOSE 152 (H) 01/04/2022 0006   BUN 29 (H) 01/04/2022 0006   CREATININE 1.03 (H) 01/04/2022 0006   CALCIUM 8.9 01/04/2022 0006   PROT 6.1 (L) 01/04/2022 0006   ALBUMIN 3.6 01/04/2022 0006   AST 28 01/04/2022 0006   ALT 30 01/04/2022 0006   ALKPHOS 36 (L) 01/04/2022 0006   BILITOT 0.7 01/04/2022 0006   GFRNONAA 52 (L) 01/04/2022 0006   GFRAA >60 09/20/2017 0602   EKG: new minimal ST depressions in lateral leads compared to Oct 2022.  COVID and Flu PCR is pending.  Assessment and Plan: * Acute respiratory failure with hypoxia ([place]) DDx = CAP vs COVID vs CHF vs MI. CXR looks like CAP No fever, no WBC. COVID and flu pending BNP elevation + rapid improvement on BIPAP (weaned off of BIPAP to 2L via  now) more suspicious for CHF. Sudden onset + CP with activity + EKG findings, concerning for MI. Got rocephin + azithro in ED COVID and flu pending Order COVID pathway (remdesivir + steroids) if COVID positive Procalcitonin pending Order PNA pathway and reorder ABx if procalcitonin positive, or if WBC elevates Consider ordering CHF pathway and diuretics if procalcitonin neg and COVID neg NPO for the moment, at least until we see if she needs to go back on BIPAP or not. CP resolved at the moment, does have ST depressions in lateral leads that appear new / changed since Oct 2022. Check trop Ordering ASA 325  now Order ACS pathway and cards consult if trop elevated. Tele monitor  Chronic diastolic CHF (congestive heart failure) (HCC) Unclear if acute decompensation today causing SOB presentation: CXR looks more like PNA per radiologist though BNP elevated, no fever, no WBC. Tele monitor  History of ETOH abuse Pt with history of EtOH use/abuse, ED visit(s) noted for intoxication within the past 1 yr. Putting on CIWA for withdrawal prevention.  Essential hypertension BP  on soft side in ED. Hold amlodipine for the moment.      Advance Care Planning:   Code Status: Full Code  Consults: None  Family Communication: No family in room  Severity of Illness: The appropriate patient status for this patient is OBSERVATION. Observation status is judged to be reasonable and necessary in order to provide the required intensity of service to ensure the patient's safety. The patient's presenting symptoms, physical exam findings, and initial radiographic and laboratory data in the context of their medical condition is felt to place them at decreased risk for further clinical deterioration. Furthermore, it is anticipated that the patient will be medically stable for discharge from the hospital within 2 midnights of admission.   Author: Etta Quill., DO 01/04/2022 3:05 AM  For on call review [email or web address].

## 2022-01-04 NOTE — Assessment & Plan Note (Addendum)
BP on soft side in ED. Hold amlodipine for the moment.

## 2022-01-04 NOTE — ED Triage Notes (Signed)
The pt came from the  [facility]

## 2022-01-05 ENCOUNTER — Encounter (HOSPITAL_COMMUNITY)
Admission: EM | Disposition: A | Discharge status: Skilled Nursing Facility | Payer: Self-pay | Source: Skilled Nursing Facility | Attending: Family Medicine

## 2022-01-05 DIAGNOSIS — I214 Non-ST elevation (NSTEMI) myocardial infarction: Secondary | ICD-10-CM

## 2022-01-05 DIAGNOSIS — I25110 Atherosclerotic heart disease of native coronary artery with unstable angina pectoris: Secondary | ICD-10-CM

## 2022-01-05 DIAGNOSIS — J9601 Acute respiratory failure with hypoxia: Secondary | ICD-10-CM | POA: Diagnosis not present

## 2022-01-05 HISTORY — PX: LEFT HEART CATH AND CORONARY ANGIOGRAPHY: CATH118249

## 2022-01-05 LAB — CBC
HCT: 35.2 % — ABNORMAL LOW (ref 36.0–46.0)
Hemoglobin: 12.1 g/dL (ref 12.0–15.0)
MCH: 33.4 pg (ref 26.0–34.0)
MCHC: 34.4 g/dL (ref 30.0–36.0)
MCV: 97.2 fL (ref 80.0–100.0)
Platelets: 269 10*3/uL (ref 150–400)
RBC: 3.62 MIL/uL — ABNORMAL LOW (ref 3.87–5.11)
RDW: 11.9 % (ref 11.5–15.5)
WBC: 7 10*3/uL (ref 4.0–10.5)
nRBC: 0 % (ref 0.0–0.2)

## 2022-01-05 LAB — LIPID PANEL
Cholesterol: 121 mg/dL (ref 0–200)
HDL: 40 mg/dL — ABNORMAL LOW (ref >40)
LDL Cholesterol: 70 mg/dL (ref 0–99)
Total CHOL/HDL Ratio: 3 RATIO
Triglycerides: 56 mg/dL (ref <150)
VLDL: 11 mg/dL (ref 0–40)

## 2022-01-05 LAB — COMPREHENSIVE METABOLIC PANEL
ALT: 22 U/L (ref 0–44)
AST: 19 U/L (ref 15–41)
Albumin: 3.1 g/dL — ABNORMAL LOW (ref 3.5–5.0)
Alkaline Phosphatase: 30 U/L — ABNORMAL LOW (ref 38–126)
Anion gap: 9 (ref 5–15)
BUN: 21 mg/dL (ref 8–23)
CO2: 28 mmol/L (ref 22–32)
Calcium: 9.4 mg/dL (ref 8.9–10.3)
Chloride: 99 mmol/L (ref 98–111)
Creatinine, Ser: 1.03 mg/dL — ABNORMAL HIGH (ref 0.44–1.00)
GFR, Estimated: 52 mL/min — ABNORMAL LOW (ref >60)
Glucose, Bld: 95 mg/dL (ref 70–99)
Potassium: 4 mmol/L (ref 3.5–5.1)
Sodium: 136 mmol/L (ref 135–145)
Total Bilirubin: 0.8 mg/dL (ref 0.3–1.2)
Total Protein: 5.7 g/dL — ABNORMAL LOW (ref 6.5–8.1)

## 2022-01-05 LAB — HEPARIN LEVEL (UNFRACTIONATED)
Heparin Unfractionated: 0.28 IU/mL — ABNORMAL LOW (ref 0.30–0.70)
Heparin Unfractionated: 0.34 IU/mL (ref 0.30–0.70)

## 2022-01-05 LAB — PROCALCITONIN: Procalcitonin: <0.1 ng/mL

## 2022-01-05 LAB — MAGNESIUM: Magnesium: 2 mg/dL (ref 1.7–2.4)

## 2022-01-05 LAB — GLUCOSE, CAPILLARY: Glucose-Capillary: 159 mg/dL — ABNORMAL HIGH (ref 70–99)

## 2022-01-05 SURGERY — LEFT HEART CATH AND CORONARY ANGIOGRAPHY
Anesthesia: LOCAL

## 2022-01-05 MED ORDER — ASPIRIN 81 MG PO CHEW: 81.0000 mg | CHEWABLE_TABLET | ORAL | Status: AC

## 2022-01-05 MED ORDER — AMIODARONE HCL IN DEXTROSE 360-4.14 MG/200ML-% IV SOLN
30.0000 mg/h | INTRAVENOUS | Status: DC
  Administered 2022-01-05: 60 mg/h via INTRAVENOUS

## 2022-01-05 MED ORDER — HEPARIN SODIUM (PORCINE) 1000 UNIT/ML IJ SOLN
INTRAMUSCULAR | Status: AC
  Filled 2022-01-05: qty 10

## 2022-01-05 MED ORDER — ASPIRIN 81 MG PO CHEW: 81.0000 mg | CHEWABLE_TABLET | ORAL | Status: DC

## 2022-01-05 MED ORDER — SODIUM CHLORIDE 0.9% FLUSH
3.0000 mL | Freq: Two times a day (BID) | INTRAVENOUS | Status: DC
  Administered 2022-01-05 – 2022-01-12 (×10): 3 mL via INTRAVENOUS

## 2022-01-05 MED ORDER — AMIODARONE HCL IN DEXTROSE 360-4.14 MG/200ML-% IV SOLN
30.0000 mg/h | INTRAVENOUS | Status: DC
  Filled 2022-01-05: qty 200

## 2022-01-05 MED ORDER — SODIUM CHLORIDE 0.9 % IV SOLN: INTRAVENOUS | Status: DC

## 2022-01-05 MED ORDER — LEVALBUTEROL HCL 0.63 MG/3ML IN NEBU
0.6300 mg | INHALATION_SOLUTION | Freq: Four times a day (QID) | RESPIRATORY_TRACT | Status: DC | PRN
Start: 2022-01-05 — End: 2022-01-12
  Administered 2022-01-10: 0.63 mg via RESPIRATORY_TRACT
  Filled 2022-01-05: qty 3

## 2022-01-05 MED ORDER — SODIUM CHLORIDE 0.9 % IV SOLN: 250.0000 mL | INTRAVENOUS | Status: DC | PRN

## 2022-01-05 MED ORDER — LIDOCAINE HCL (PF) 1 % IJ SOLN
INTRAMUSCULAR | Status: AC
  Filled 2022-01-05: qty 30

## 2022-01-05 MED ORDER — ATORVASTATIN CALCIUM 40 MG PO TABS: 40.0000 mg | ORAL_TABLET | Freq: Every day | ORAL | Status: DC

## 2022-01-05 MED ORDER — VERAPAMIL HCL 2.5 MG/ML IV SOLN
INTRAVENOUS | Status: AC
  Filled 2022-01-05: qty 2

## 2022-01-05 MED ORDER — ATORVASTATIN CALCIUM 10 MG PO TABS
20.0000 mg | ORAL_TABLET | Freq: Every day | ORAL | Status: DC
  Administered 2022-01-05 – 2022-01-10 (×5): 20 mg via ORAL
  Filled 2022-01-05 (×5): qty 2

## 2022-01-05 MED ORDER — SODIUM CHLORIDE 0.9% FLUSH: 3.0000 mL | INTRAVENOUS | Status: DC | PRN

## 2022-01-05 MED ORDER — HEPARIN (PORCINE) IN NACL 1000-0.9 UT/500ML-% IV SOLN
INTRAVENOUS | Status: AC
  Filled 2022-01-05: qty 1000

## 2022-01-05 SURGICAL SUPPLY — 8 items
GLIDESHEATH SLEND A-KIT 6F 22G (SHEATH) IMPLANT
GUIDEWIRE INQWIRE 1.5J.035X260 (WIRE) IMPLANT
INQWIRE 1.5J .035X260CM (WIRE) ×1
KIT HEART LEFT (KITS) ×1 IMPLANT
PACK CARDIAC CATHETERIZATION (CUSTOM PROCEDURE TRAY) ×1 IMPLANT
SHEATH PROBE COVER 6X72 (BAG) IMPLANT
TRANSDUCER W/STOPCOCK (MISCELLANEOUS) ×1 IMPLANT
TUBING CIL FLEX 10 FLL-RA (TUBING) ×1 IMPLANT

## 2022-01-05 NOTE — Progress Notes (Signed)
Pt received back from cath lab, procedure not done.  VS remain stable.  Pt arouseable and appropriate however remains lethargic and weak.  MD aware and coming to bedside.  RR RN called and also coming to assess.  Will cont to monitor.

## 2022-01-05 NOTE — Progress Notes (Signed)
PT Cancellation Note  Patient Details Name: MELISSIA LAHMAN MRN: [id number] DOB: 26-Feb-1931   Cancelled Treatment:    Reason Eval/Treat Not Completed: Medical issues which prohibited therapy;Patient at procedure or test/unavailable Held in am per RN due to irregular HR and anxiety.  In pm, pt at cath lab.  Will f/u at later date. Abran Richard, PT Acute Rehab [facility] [phone]   Karlton Lemon 01/05/2022, 2:32 PM

## 2022-01-05 NOTE — Interval H&P Note (Signed)
Cath Lab Visit (complete for each Cath Lab visit)  Clinical Evaluation Leading to the Procedure:   ACS: Yes.    Non-ACS:    Anginal Classification: CCS II  Anti-ischemic medical therapy: Minimal Therapy (1 class of medications)  Non-Invasive Test Results: No non-invasive testing performed  Prior CABG: No previous CABG      History and Physical Interval Note:  01/05/2022 2:17 PM  Ellen Chandler  has presented today for surgery, with the diagnosis of nstemi, reduced LV function.  The various methods of treatment have been discussed with the patient and family. After consideration of risks, benefits and other options for treatment, the patient has consented to  Procedure(s): LEFT HEART CATH AND CORONARY ANGIOGRAPHY (N/A) as a surgical intervention.  The patient's history has been reviewed, patient examined, no change in status, stable for surgery.  I have reviewed the patient's chart and labs.  Questions were answered to the patient's satisfaction.     Belva Crome III

## 2022-01-05 NOTE — Progress Notes (Signed)
Repage made to Dr Marlowe Sax made to inquire about the NTG gtt.

## 2022-01-05 NOTE — Progress Notes (Signed)
ANTICOAGULATION CONSULT NOTE   Pharmacy Consult for heparin  Indication: chest pain/ACS and atrial fibrillation  No Known Allergies  Patient Measurements: Height: '5\' 4"'$  (162.6 cm) Weight: 61 kg (134 lb 7.7 oz) IBW/kg (Calculated) : 54.7 Heparin Dosing Weight: 61 kg  Vital Signs: Temp: 98 F (36.7 C) (09/01 1444) Temp Source: Axillary (09/01 1444) BP: 124/66 (09/01 1444) Pulse Rate: 91 (09/01 1444)  Labs: Recent Labs    01/04/22 0006 01/04/22 0039 01/04/22 0416 01/04/22 0932 01/04/22 1145 01/05/22 0434 01/05/22 1227  HGB 13.4 13.3 11.6*  --   --  12.1  --   HCT 39.3 39.0 35.1*  --   --  35.2*  --   PLT 318  --  266  --   --  269  --   HEPARINUNFRC  --   --   --   --   --  0.28* 0.34  CREATININE 1.03*  --  0.89  --   --  1.03*  --   TROPONINIHS  --   --  123* 128* 102*  --   --      Estimated Creatinine Clearance: 32.6 mL/min (A) (by C-G formula based on SCr of 1.03 mg/dL (H)).   Medical History: Past Medical History:  Diagnosis Date   Abnormal ECG    a. Pt aware of long hx of abnl ecg->h/o normal stress testing in [place] ~ 2000.   Diastolic dysfunction    a. 04/2013 Echo: EF 60-65%, No rwma, Gr 1 DD, mild MR.   GERD (gastroesophageal reflux disease)    Hypertension    Hypothyroidism     Medications:  Scheduled:   aspirin EC  81 mg Oral Daily   atorvastatin  20 mg Oral Daily   furosemide  40 mg Intravenous BID   heparin  3,000 Units Intravenous Once   sodium chloride flush  3 mL Intravenous Q12H    Assessment: 88 yo F with PMH HFpEF presents with trouble breathing. Pt does not take anticoagulants at home according to completed med history or recent fill history. Pharmacy is consulted to dose heparin with indication of afib + ACS  Heparin drip 900 uts/hr heparin level 0.34 at goal  No infusion issues or overt s/sx of bleeding per RN. CBC stable  Goal of Therapy:  Heparin level 0.3-0.7 units/ml Monitor platelets by anticoagulation protocol:  Yes   Plan:  Continue heparin gtt  900 units/hr  Monitor CBC, HL, s/sx bleeding and transition to oral therapies as appropriate.      Bonnita Nasuti Pharm.D. CPP, BCPS Clinical Pharmacist [phone] 01/05/2022 3:06 PM   Please check AMION for all [place] numbers

## 2022-01-05 NOTE — Progress Notes (Signed)
Chaplain responded to page from pt's nurse who reported pt has a HCPOA filled out and in need of notary.  Chaplain explained this department's notary will not be back on duty until Monday at which time volunteers who must witness the pt's signature will be in the house as well.  Nurse expressed understanding and will pass this info on to pt and staff.  [place]

## 2022-01-05 NOTE — Progress Notes (Signed)
Dr Regina Eck returned page/ phone call  Update given to her on patient.   Orders recived to not stop NTG and to d/c Cardizem

## 2022-01-05 NOTE — Progress Notes (Signed)
ANTICOAGULATION CONSULT NOTE   Pharmacy Consult for heparin  Indication: chest pain/ACS and atrial fibrillation  No Known Allergies  Patient Measurements: Height: '5\' 4"'$  (162.6 cm) Weight: 61 kg (134 lb 7.7 oz) IBW/kg (Calculated) : 54.7 Heparin Dosing Weight: 61 kg  Vital Signs: Temp: 98.1 F (36.7 C) (09/01 0358) Temp Source: Oral (09/01 0358) BP: 88/52 (09/01 0358) Pulse Rate: 100 (09/01 0358)  Labs: Recent Labs    01/04/22 0006 01/04/22 0039 01/04/22 0416 01/04/22 0932 01/04/22 1145 01/05/22 0434  HGB 13.4 13.3 11.6*  --   --  12.1  HCT 39.3 39.0 35.1*  --   --  35.2*  PLT 318  --  266  --   --  269  HEPARINUNFRC  --   --   --   --   --  0.28*  CREATININE 1.03*  --  0.89  --   --   --   TROPONINIHS  --   --  123* 128* 102*  --      Estimated Creatinine Clearance: 37.7 mL/min (by C-G formula based on SCr of 0.89 mg/dL).   Medical History: Past Medical History:  Diagnosis Date   Abnormal ECG    a. Pt aware of long hx of abnl ecg->h/o normal stress testing in [name] ~ 2000.   Diastolic dysfunction    a. 04/2013 Echo: EF 60-65%, No rwma, Gr 1 DD, mild MR.   GERD (gastroesophageal reflux disease)    Hypertension    Hypothyroidism     Medications:  Scheduled:   aspirin EC  81 mg Oral Daily   folic acid  1 mg Oral Daily   furosemide  40 mg Intravenous BID   heparin  3,000 Units Intravenous Once   levalbuterol  0.63 mg Nebulization Q6H   multivitamin with minerals  1 tablet Oral Daily   thiamine  100 mg Oral Daily   Or   thiamine  100 mg Intravenous Daily    Assessment: 88 yo F with PMH HFpEF presents with trouble breathing. Pt does not take anticoagulants at home according to completed med history or recent fill history. Pharmacy is consulted to dose heparin with indication of afib + ACS  Initial heparin level slightly below goal: 0.28 on 750 units/hr. No infusion issues or overt s/sx of bleeding per RN. CBC stable  Goal of Therapy:  Heparin  level 0.3-0.7 units/ml Monitor platelets by anticoagulation protocol: Yes   Plan:  Increase heparin gtt to 900 units/hr  Heparin level in 8hrs  Monitor CBC, HL, s/sx bleeding and transition to oral therapies as appropriate.    Georga Bora, PharmD Clinical Pharmacist 01/05/2022 5:25 AM Please check AMION for all [place] numbers

## 2022-01-05 NOTE — Progress Notes (Signed)
Patient back from procedure. Vitals taken

## 2022-01-05 NOTE — Progress Notes (Signed)
Dr Marlowe Sax paged to let her know patient's BP 101/53 and that patient denied CP.  Wanted to see what she wanted to do about the NTG gtt.

## 2022-01-05 NOTE — Progress Notes (Signed)
Pt complaining of nausea and asking for "medicine" to be "turned off," referring to amio gtt.  Pt more lethargic than prior to initiation of amiodarone and dry heaving at this time.  PRN meds given and MD notified.  VS stable.  Son at bedside.  Will cont to monitor.

## 2022-01-05 NOTE — H&P (View-Only) (Signed)
Rounding Note    Patient Name: Ellen Chandler Date of Encounter: 01/05/2022  [place] Cardiologist: Mertie Moores, MD   Subjective   Breathing improved. No chest pain. Sob at bedside. Cardizem stopped due to soft blood pressure.   Inpatient Medications    Scheduled Meds:  aspirin EC  81 mg Oral Daily   folic acid  1 mg Oral Daily   furosemide  40 mg Intravenous BID   heparin  3,000 Units Intravenous Once   levalbuterol  0.63 mg Nebulization Q6H   multivitamin with minerals  1 tablet Oral Daily   thiamine  100 mg Oral Daily   Or   thiamine  100 mg Intravenous Daily   Continuous Infusions:  diltiazem (CARDIZEM) infusion 5 mg/hr (01/04/22 1849)   heparin 900 Units/hr (01/05/22 0547)   PRN Meds: acetaminophen **OR** acetaminophen, alum & mag hydroxide-simeth, alum & mag hydroxide-simeth, LORazepam **OR** LORazepam, ondansetron **OR** ondansetron (ZOFRAN) IV   Vital Signs    Vitals:   01/04/22 2353 01/05/22 0254 01/05/22 0358 01/05/22 0730  BP: (!) 84/57  (!) 88/52 112/71  Pulse: 94  100 (!) 112  Resp: '17  16 16  '$ Temp: 98.5 F (36.9 C)  98.1 F (36.7 C) 98.4 F (36.9 C)  TempSrc: Oral  Oral Oral  SpO2: 97% 98% 96% 98%  Weight:      Height:        Intake/Output Summary (Last 24 hours) at 01/05/2022 0904 Last data filed at 01/05/2022 0358 Gross per 24 hour  Intake --  Output 1100 ml  Net -1100 ml      01/04/2022    9:02 PM 01/04/2022   12:19 AM 06/17/2021   11:03 AM  Last 3 Weights  Weight (lbs) 134 lb 7.7 oz 134 lb 7.7 oz 135 lb  Weight (kg) 61 kg 61 kg 61.236 kg      Telemetry    Atrial fibrillation at 120-130s - Personally Reviewed  ECG   N/A  Physical Exam   GEN: No acute distress.   Neck: No JVD Cardiac: IR IR tachycardic, no murmurs, rubs, or gallops.  Respiratory: course breath sound  GI: Soft, nontender, non-distended  MS: No edema; No deformity. Neuro:  Nonfocal  Psych: Normal affect   Labs    High Sensitivity  Troponin:   Recent Labs  Lab 01/04/22 0416 01/04/22 0932 01/04/22 1145  TROPONINIHS 123* 128* 102*     Chemistry Recent Labs  Lab 01/04/22 0006 01/04/22 0039 01/04/22 0416 01/05/22 0434  NA 135 135 136 136  K 3.9 3.9 4.4 4.0  CL 103  --  102 99  CO2 22  --  23 28  GLUCOSE 152*  --  103* 95  BUN 29*  --  27* 21  CREATININE 1.03*  --  0.89 1.03*  CALCIUM 8.9  --  8.9 9.4  MG  --   --   --  2.0  PROT 6.1*  --  5.3* 5.7*  ALBUMIN 3.6  --  3.2* 3.1*  AST 28  --  23 19  ALT 30  --  26 22  ALKPHOS 36*  --  29* 30*  BILITOT 0.7  --  0.7 0.8  GFRNONAA 52*  --  >60 52*  ANIONGAP 10  --  11 9    Lipids  Recent Labs  Lab 01/05/22 0434  CHOL 121  TRIG 56  HDL 40*  LDLCALC 70  CHOLHDL 3.0    Hematology Recent Labs  Lab 01/04/22 0006 01/04/22 0039 01/04/22 0416 01/05/22 0434  WBC 8.7  --  8.6 7.0  RBC 3.96  --  3.49* 3.62*  HGB 13.4 13.3 11.6* 12.1  HCT 39.3 39.0 35.1* 35.2*  MCV 99.2  --  100.6* 97.2  MCH 33.8  --  33.2 33.4  MCHC 34.1  --  33.0 34.4  RDW 12.0  --  12.1 11.9  PLT 318  --  266 269   Thyroid No results for input(s): "TSH", "FREET4" in the last 168 hours.  BNP Recent Labs  Lab 01/04/22 0006  BNP 519.6*    DDimer No results for input(s): "DDIMER" in the last 168 hours.   Radiology    ECHOCARDIOGRAM COMPLETE  Result Date: 01/04/2022    ECHOCARDIOGRAM REPORT   Patient Name:   Ellen Chandler Date of Exam: 01/04/2022 Medical Rec #:  [id number]         Height:       64.0 in Accession #:    [id number]        Weight:       134.5 lb Date of Birth:  [birth year]         BSA:          1.653 m Patient Age:    88 years          BP:           129/82 mmHg Patient Gender: F                 HR:           108 bpm. Exam Location:  Inpatient Procedure: 2D Echo, Cardiac Doppler and Color Doppler Indications:    CHF-Acute Diastolic P32.95  History:        Patient has prior history of Echocardiogram examinations, most                 recent 05/05/2013. CHF, Angina;  Risk Factors:Hypertension.  Sonographer:    Ronny Flurry Referring Phys: [place]  1. Left ventricular ejection fraction, by estimation, is 40 to 45%. The left ventricle has mildly decreased function. The left ventricle has no regional wall motion abnormalities. Left ventricular diastolic parameters are consistent with Grade I diastolic dysfunction (impaired relaxation). Elevated left ventricular end-diastolic pressure.  2. Right ventricular systolic function is normal. The right ventricular size is normal. There is normal pulmonary artery systolic pressure.  3. Left atrial size was moderately dilated.  4. The mitral valve is normal in structure. Trivial mitral valve regurgitation. No evidence of mitral stenosis.  5. The aortic valve is tricuspid. There is mild calcification of the aortic valve. There is mild thickening of the aortic valve. Aortic valve regurgitation is trivial. No aortic stenosis is present.  6. The inferior vena cava is normal in size with greater than 50% respiratory variability, suggesting right atrial pressure of 3 mmHg. FINDINGS  Left Ventricle: Left ventricular ejection fraction, by estimation, is 40 to 45%. The left ventricle has mildly decreased function. The left ventricle has no regional wall motion abnormalities. The left ventricular internal cavity size was normal in size. There is no left ventricular hypertrophy. Left ventricular diastolic parameters are consistent with Grade I diastolic dysfunction (impaired relaxation). Elevated left ventricular end-diastolic pressure.  LV Wall Scoring: The entire septum, apical anterior segment, apical inferior segment, and apex are hypokinetic. The anterior wall, entire lateral wall, and inferior wall are normal. Right Ventricle: The right ventricular size is normal. No increase  in right ventricular wall thickness. Right ventricular systolic function is normal. There is normal pulmonary artery systolic pressure. The  tricuspid regurgitant velocity is 1.57 m/s, and  with an assumed right atrial pressure of 3 mmHg, the estimated right ventricular systolic pressure is 82.4 mmHg. Left Atrium: Left atrial size was moderately dilated. Right Atrium: Right atrial size was normal in size. Pericardium: There is no evidence of pericardial effusion. Mitral Valve: The mitral valve is normal in structure. Trivial mitral valve regurgitation. No evidence of mitral valve stenosis. Tricuspid Valve: The tricuspid valve is normal in structure. Tricuspid valve regurgitation is trivial. No evidence of tricuspid stenosis. Aortic Valve: The aortic valve is tricuspid. There is mild calcification of the aortic valve. There is mild thickening of the aortic valve. Aortic valve regurgitation is trivial. No aortic stenosis is present. Pulmonic Valve: The pulmonic valve was normal in structure. Pulmonic valve regurgitation is not visualized. No evidence of pulmonic stenosis. Aorta: The aortic root is normal in size and structure. Venous: The inferior vena cava is normal in size with greater than 50% respiratory variability, suggesting right atrial pressure of 3 mmHg. IAS/Shunts: No atrial level shunt detected by color flow Doppler.  LEFT VENTRICLE PLAX 2D LVIDd:         4.80 cm   Diastology LVIDs:         3.80 cm   LV e' medial:   7.46 cm/s LV PW:         0.90 cm   LV E/e' medial: 17.0 LV IVS:        0.70 cm LVOT diam:     1.80 cm LV SV:         62 LV SV Index:   38 LVOT Area:     2.54 cm  RIGHT VENTRICLE RV S prime:     11.90 cm/s TAPSE (M-mode): 2.4 cm LEFT ATRIUM             Index        RIGHT ATRIUM           Index LA diam:        4.00 cm 2.42 cm/m   RA Area:     14.20 cm LA Vol (A2C):   61.2 ml 37.03 ml/m  RA Volume:   33.20 ml  20.09 ml/m LA Vol (A4C):   55.3 ml 33.46 ml/m LA Biplane Vol: 59.9 ml 36.24 ml/m  AORTIC VALVE LVOT Vmax:   107.00 cm/s LVOT Vmean:  73.600 cm/s LVOT VTI:    0.245 m  AORTA Ao Root diam: 3.30 cm Ao Asc diam:  3.40 cm  MITRAL VALVE                TRICUSPID VALVE MV Area (PHT): 4.24 cm     TR Peak grad:   9.9 mmHg MV Decel Time: 179 msec     TR Vmax:        157.00 cm/s MV E velocity: 127.00 cm/s MV A velocity: 132.00 cm/s  SHUNTS MV E/A ratio:  0.96         Systemic VTI:  0.24 m                             Systemic Diam: 1.80 cm Skeet Latch MD Electronically signed by Skeet Latch MD Signature Date/Time: 01/04/2022/5:53:23 PM    Final    CT Angio Chest Pulmonary Embolism (PE) W or WO Contrast  Result Date: 01/04/2022  CLINICAL DATA:  Rule out pulmonary embolus. Chest pain. Shortness of breath. EXAM: CT ANGIOGRAPHY CHEST WITH CONTRAST TECHNIQUE: Multidetector CT imaging of the chest was performed using the standard protocol during bolus administration of intravenous contrast. Multiplanar CT image reconstructions and MIPs were obtained to evaluate the vascular anatomy. RADIATION DOSE REDUCTION: This exam was performed according to the departmental dose-optimization program which includes automated exposure control, adjustment of the mA and/or kV according to patient size and/or use of iterative reconstruction technique. CONTRAST:  55m OMNIPAQUE IOHEXOL 350 MG/ML SOLN COMPARISON:  None Available. FINDINGS: Cardiovascular: Satisfactory opacification of the pulmonary arteries to the segmental level. No evidence of pulmonary embolism. Normal heart size. Aortic atherosclerotic calcifications and coronary artery calcifications. No pericardial effusion. Mediastinum/Nodes: Thyroid gland, trachea are unremarkable. There is a large hiatal hernia identified which contains at least 50% intrathoracic stomach. Circumferential wall thickening of the distal esophagus just above the hiatal hernia is noted which may reflect esophagitis. No enlarged axillary, supraclavicular, mediastinal or hilar lymph nodes. Lungs/Pleura: Small bilateral pleural effusions are identified. There is diffuse interlobular septal thickening and ground-glass  attenuation bilaterally concerning for interstitial edema. Atelectasis is noted within the posterior lower lobes overlying both pleural effusions. There is also atelectasis versus scarring noted within the inferior lingula. A few scattered patchy airspace densities are noted within the posterolateral right upper lobe and superior segment of right lower lobe. Within the medial apical segment of the right upper lobe there is a subpleural nodular density measuring 1.8 by 1.0 by 0.8 cm, image 23/5 and image 76/8. This appears similar to cervical spine CT from 02/14/2021 and is favored to represent an area of pleuroparenchymal scarring. Upper Abdomen: Large stone is identified within the gallbladder measuring 2.5 cm. This is only partially visualized. No acute abnormality. Musculoskeletal: Diffuse osteopenia. Midthoracic spine compression deformity is stable compared with chest radiograph from 06/17/2021. Review of the MIP images confirms the above findings. IMPRESSION: 1. No evidence for acute pulmonary embolism. 2. Diffuse interlobular septal thickening and ground-glass attenuation bilaterally concerning for interstitial edema. 3. Small bilateral pleural effusions with overlying atelectasis. 4. A few scattered patchy airspace densities are noted within the right lung. Findings may reflect inflammation or infection versus asymmetric areas of alveolar edema. 5. Large hiatal hernia. Circumferential wall thickening of the distal esophagus just above the hiatal hernia may reflect esophagitis. 6. Cholelithiasis. 7. Stable midthoracic spine compression deformity. 8. Coronary artery calcifications. 9. Aortic Atherosclerosis (ICD10-I70.0). Electronically Signed   By: TKerby MoorsM.D.   On: 01/04/2022 10:24   DG Chest Port 1 View  Result Date: 01/04/2022 CLINICAL DATA:  Shortness of breath EXAM: PORTABLE CHEST 1 VIEW COMPARISON:  06/17/2021 FINDINGS: Cardiac shadow is enlarged but stable. Aortic calcifications are again  seen. Large hiatal hernia is again noted. The lungs are well aerated bilaterally. Patchy airspace opacity is noted in the left base likely related to acute infiltrate. Mild central vascular congestion is noted. No bony abnormality is seen. IMPRESSION: Central vascular congestion. Left basilar infiltrate consistent with acute pneumonia. Electronically Signed   By: MInez CatalinaM.D.   On: 01/04/2022 00:19    Cardiac Studies   Echo 01/04/2022  1. Left ventricular ejection fraction, by estimation, is 40 to 45%. The  left ventricle has mildly decreased function. The left ventricle has no  regional wall motion abnormalities. Left ventricular diastolic parameters  are consistent with Grade I  diastolic dysfunction (impaired relaxation). Elevated left ventricular  end-diastolic pressure.   2. Right ventricular systolic function  is normal. The right ventricular  size is normal. There is normal pulmonary artery systolic pressure.   3. Left atrial size was moderately dilated.   4. The mitral valve is normal in structure. Trivial mitral valve  regurgitation. No evidence of mitral stenosis.   5. The aortic valve is tricuspid. There is mild calcification of the  aortic valve. There is mild thickening of the aortic valve. Aortic valve  regurgitation is trivial. No aortic stenosis is present.   6. The inferior vena cava is normal in size with greater than 50%  respiratory variability, suggesting right atrial pressure of 3 mmHg.   FINDINGS   Left Ventricle: Left ventricular ejection fraction, by estimation, is 40  to 45%. The left ventricle has mildly decreased function. The left  ventricle has no regional wall motion abnormalities. The left ventricular  internal cavity size was normal in  size. There is no left ventricular hypertrophy. Left ventricular diastolic  parameters are consistent with Grade I diastolic dysfunction (impaired  relaxation). Elevated left ventricular end-diastolic pressure.       LV Wall Scoring:  The entire septum, apical anterior segment, apical inferior segment, and  apex  are hypokinetic. The anterior wall, entire lateral wall, and inferior wall  are normal.   Patient Profile     88 y.o. female  hypertension, HFpEF, GERD, hypothyroidism who is being seen 01/04/2022 for the evaluation of CHF at the request of Dr. Verlon Au.  Assessment & Plan    1.  Acute pulmonary edema 2.  Acute hypoxic respiratory 3.  Acute systolic heart failure -  BNP 514.chest x-ray suggestive of pneumonia. CT angio chest with small bilateral effusions.  - Initially required BiPAP.  Now weaned off. -Started on IV Lasix with improved breathing. -Echocardiogram showing LV function of 40 to 45% and grade 1 diastolic dysfunction.  Hypokinetic septum, apical anterior segment, apical inferior segment and apex. -Need I&O 1.1 L. + 400 cc in urinal. -We will add directed medical therapy as lungs improves and post catheterization.  4.  Atrial fibrillation with rapid ventricular rate -Patient with longstanding history of irregular heart rate for 30 years but never found to have atrial fibrillation. -She was started on IV Cardizem but stopped due to hypotension.  Also not a good drug with low EF. -Given elevated heart rate, will start IV amiodarone -On heparin for anticoagulation.  Switch to oral postprocedure. -Check TSH  This patients CHA2DS2-VASc Score and unadjusted Ischemic Stroke Rate (% per year) is equal to 7.2 % stroke rate/year from a score of 5  Above score calculated as 1 point each if present [CHF, HTN, DM, Vascular=MI/PAD/Aortic Plaque, Age if 65-74, or Female] Above score calculated as 2 points each if present [Age > 75, or Stroke/TIA/TE]      5.  Elevated troponin -Peaked at 128.  This is could be demand ischemia.  Given low LV function recommended cardiac cath.  Continue aspirin and heparin.  Add Lipitor '20mg'$  qd.  -Patient lives at independent facility.  Use walker for  ambulation.  She is functional. -Son was present during discussion.  The patient understands that risks include but are not limited to stroke (1 in 1000), death (1 in 93), kidney failure [usually temporary] (1 in 500), bleeding (1 in 200), allergic reaction [possibly serious] (1 in 200), and agrees to proceed.     6.  Pneumonia -Per primary team  7. HTN - Norvasc on hold. Will change to BB prior to discharge.   For questions  or updates, please contact [place] Please consult [email or web address] for contact info under        SignedLeanor Kail, PA  01/05/2022, 9:04 AM      Attending Note:   The patient was seen and examined.  Agree with assessment and plan as noted above.  Changes made to the above note as needed.  Patient seen and independently examined with Robbie Lis, PA .   We discussed all aspects of the encounter. I agree with the assessment and plan as stated above.    NSTEMI:   troponins are elevated and trending downward.   She has  LVEF of 40-45% with grade I DD.   By may review of her echo, she has anterioseptal hypokinesis / akinesis.   My hope is that she will have a lesion that is amenable to stenting.  She is 88 years old and may not be a good candidate for bypass grafting.  We will make that decision following heart catheterization.  2.  Acute on chronic congestive heart failure: She has improved with diuresis.  Continue current medications.  3.  Persistent atrial fibrillation: He is got into atrial fibrillation since she arrived in the ER.  She did not tolerate the diltiazem drip due to hypotension.  We will start her on amiodarone.   I have spent a total of 40 minutes with patient reviewing hospital  notes , telemetry, EKGs, labs and examining patient as well as establishing an assessment and plan that was discussed with the patient.  > 50% of time was spent in direct patient care.   Thayer Headings, Brooke Bonito., MD, [facility] 01/05/2022, 9:29 AM [address]  [place] Pager [phone]

## 2022-01-05 NOTE — Progress Notes (Signed)
PROGRESS NOTE   Ellen Chandler  ZOX:[id number] DOB: 10-Jan-1931 DOA: 01/03/2022 PCP: Deland Pretty, MD  Brief Narrative:  88 Y female independent living facility resident HFpEF no CAD HTN with complications of hypotension in the past HLD hypothyroid reflux DM TY 2 Known ethanol habituation in the past   Was talking on daughter over the phone and patient became short of breath associated with chest pain placed on NIPPV  BNP noted to be elevated, troponin 123 CXR suggestive of PNA but on my over read not convinced there does seem to be some increased interstitial opacities on the right side BUNs/creatinine 29/1.03--> 27/0.89   COVID is negative--EKG showed PVCs with ST inversions Patient subsequently flipped into A-fib RVR  Cardiology consulted see below  Hospital-Problem based course  NSTEMI Going for cardiac cath today--will look for culprit lesion although poor CABG candidate Cannot give nitroglycerin/beta-blocker secondary to hypotension, continue ASA 81, heparin GTT Defer rest to cards  New onset A-fib/flutter RVR CHADVASC >3 has bled score less than 2 Hypotensive when placed on Cardizem drip 8/31 Now on amnio drip defer to cardiology I have canceled her TSH which can be ordered as an outpatient  Acute decompensated HFrEF EF 40 to 50%--cath to be done today Required transient BiPAP in emergency room Diuresis as per cardiology Lasix 40 twice daily, -1.1 L so far  Impaired glucose tolerance A1c is probably less than 6.2 based on bedside CBG readings-cancel A1c  Prior ethanol habituation- No real signs or symptoms of DTs at this time-okay to discontinue  DVT prophylaxis: Heparin GTT Code Status: Full Family Communication: Discussed with patient's son [name] at the bedside Disposition:  Status is: Inpatient Remains inpatient appropriate because:   Awaiting cardiac cath and will need therapy eval postprocedure as is at independent living   Consultants:   Cardiology  Procedures: Cardiac cath planned  Antimicrobials: None   Subjective: Did not sleep well overnight about 3 hours only no overt chest pain A little bit more somnolent than when I first saw her no shortness of breath  Objective: Vitals:   01/05/22 0254 01/05/22 0358 01/05/22 0730 01/05/22 0943  BP:  (!) 88/52 112/71   Pulse:  100 (!) 112   Resp:  16 16   Temp:  98.1 F (36.7 C) 98.4 F (36.9 C)   TempSrc:  Oral Oral   SpO2: 98% 96% 98% 98%  Weight:      Height:        Intake/Output Summary (Last 24 hours) at 01/05/2022 1054 Last data filed at 01/05/2022 0358 Gross per 24 hour  Intake --  Output 1100 ml  Net -1100 ml   Filed Weights   01/04/22 0019 01/04/22 2102  Weight: 61 kg 61 kg    Examination:  Awake coherent no distress EOMI NCAT no focal deficit Chest relatively clear Abdomen soft no rebound no guarding A-fib on monitors ranging from 100-120 Neuro intact moving 4 limbs equally no focal deficit although sleepy   Data Reviewed: personally reviewed   CBC    Component Value Date/Time   WBC 7.0 01/05/2022 0434   RBC 3.62 (L) 01/05/2022 0434   HGB 12.1 01/05/2022 0434   HCT 35.2 (L) 01/05/2022 0434   PLT 269 01/05/2022 0434   MCV 97.2 01/05/2022 0434   MCH 33.4 01/05/2022 0434   MCHC 34.4 01/05/2022 0434   RDW 11.9 01/05/2022 0434   LYMPHSABS 1.5 06/17/2021 1117   MONOABS 0.6 06/17/2021 1117   EOSABS 0.1 06/17/2021 1117  BASOSABS 0.0 06/17/2021 1117      Latest Ref Rng & Units 01/05/2022    4:34 AM 01/04/2022    4:16 AM 01/04/2022   12:39 AM  CMP  Glucose 70 - 99 mg/dL 95  103    BUN 8 - 23 mg/dL 21  27    Creatinine 0.44 - 1.00 mg/dL 1.03  0.89    Sodium 135 - 145 mmol/L 136  136  135   Potassium 3.5 - 5.1 mmol/L 4.0  4.4  3.9   Chloride 98 - 111 mmol/L 99  102    CO2 22 - 32 mmol/L 28  23    Calcium 8.9 - 10.3 mg/dL 9.4  8.9    Total Protein 6.5 - 8.1 g/dL 5.7  5.3    Total Bilirubin 0.3 - 1.2 mg/dL 0.8  0.7    Alkaline Phos 38 -  126 U/L 30  29    AST 15 - 41 U/L 19  23    ALT 0 - 44 U/L 22  26       Radiology Studies: ECHOCARDIOGRAM COMPLETE  Result Date: 01/04/2022    ECHOCARDIOGRAM REPORT   Patient Name:   Ellen Chandler Date of Exam: 01/04/2022 Medical Rec #:  [id number]         Height:       64.0 in Accession #:    [id number]        Weight:       134.5 lb Date of Birth:  12-23-1930         BSA:          1.653 m Patient Age:    88 years          BP:           129/82 mmHg Patient Gender: F                 HR:           108 bpm. Exam Location:  Inpatient Procedure: 2D Echo, Cardiac Doppler and Color Doppler Indications:    CHF-Acute Diastolic Q22.29  History:        Patient has prior history of Echocardiogram examinations, most                 recent 05/05/2013. CHF, Angina; Risk Factors:Hypertension.  Sonographer:    Ronny Flurry Referring Phys: [place]  1. Left ventricular ejection fraction, by estimation, is 40 to 45%. The left ventricle has mildly decreased function. The left ventricle has no regional wall motion abnormalities. Left ventricular diastolic parameters are consistent with Grade I diastolic dysfunction (impaired relaxation). Elevated left ventricular end-diastolic pressure.  2. Right ventricular systolic function is normal. The right ventricular size is normal. There is normal pulmonary artery systolic pressure.  3. Left atrial size was moderately dilated.  4. The mitral valve is normal in structure. Trivial mitral valve regurgitation. No evidence of mitral stenosis.  5. The aortic valve is tricuspid. There is mild calcification of the aortic valve. There is mild thickening of the aortic valve. Aortic valve regurgitation is trivial. No aortic stenosis is present.  6. The inferior vena cava is normal in size with greater than 50% respiratory variability, suggesting right atrial pressure of 3 mmHg. FINDINGS  Left Ventricle: Left ventricular ejection fraction, by estimation, is 40 to 45%.  The left ventricle has mildly decreased function. The left ventricle has no regional wall motion abnormalities. The left ventricular internal cavity size  was normal in size. There is no left ventricular hypertrophy. Left ventricular diastolic parameters are consistent with Grade I diastolic dysfunction (impaired relaxation). Elevated left ventricular end-diastolic pressure.  LV Wall Scoring: The entire septum, apical anterior segment, apical inferior segment, and apex are hypokinetic. The anterior wall, entire lateral wall, and inferior wall are normal. Right Ventricle: The right ventricular size is normal. No increase in right ventricular wall thickness. Right ventricular systolic function is normal. There is normal pulmonary artery systolic pressure. The tricuspid regurgitant velocity is 1.57 m/s, and  with an assumed right atrial pressure of 3 mmHg, the estimated right ventricular systolic pressure is 40.9 mmHg. Left Atrium: Left atrial size was moderately dilated. Right Atrium: Right atrial size was normal in size. Pericardium: There is no evidence of pericardial effusion. Mitral Valve: The mitral valve is normal in structure. Trivial mitral valve regurgitation. No evidence of mitral valve stenosis. Tricuspid Valve: The tricuspid valve is normal in structure. Tricuspid valve regurgitation is trivial. No evidence of tricuspid stenosis. Aortic Valve: The aortic valve is tricuspid. There is mild calcification of the aortic valve. There is mild thickening of the aortic valve. Aortic valve regurgitation is trivial. No aortic stenosis is present. Pulmonic Valve: The pulmonic valve was normal in structure. Pulmonic valve regurgitation is not visualized. No evidence of pulmonic stenosis. Aorta: The aortic root is normal in size and structure. Venous: The inferior vena cava is normal in size with greater than 50% respiratory variability, suggesting right atrial pressure of 3 mmHg. IAS/Shunts: No atrial level shunt  detected by color flow Doppler.  LEFT VENTRICLE PLAX 2D LVIDd:         4.80 cm   Diastology LVIDs:         3.80 cm   LV e' medial:   7.46 cm/s LV PW:         0.90 cm   LV E/e' medial: 17.0 LV IVS:        0.70 cm LVOT diam:     1.80 cm LV SV:         62 LV SV Index:   38 LVOT Area:     2.54 cm  RIGHT VENTRICLE RV S prime:     11.90 cm/s TAPSE (M-mode): 2.4 cm LEFT ATRIUM             Index        RIGHT ATRIUM           Index LA diam:        4.00 cm 2.42 cm/m   RA Area:     14.20 cm LA Vol (A2C):   61.2 ml 37.03 ml/m  RA Volume:   33.20 ml  20.09 ml/m LA Vol (A4C):   55.3 ml 33.46 ml/m LA Biplane Vol: 59.9 ml 36.24 ml/m  AORTIC VALVE LVOT Vmax:   107.00 cm/s LVOT Vmean:  73.600 cm/s LVOT VTI:    0.245 m  AORTA Ao Root diam: 3.30 cm Ao Asc diam:  3.40 cm MITRAL VALVE                TRICUSPID VALVE MV Area (PHT): 4.24 cm     TR Peak grad:   9.9 mmHg MV Decel Time: 179 msec     TR Vmax:        157.00 cm/s MV E velocity: 127.00 cm/s MV A velocity: 132.00 cm/s  SHUNTS MV E/A ratio:  0.96         Systemic VTI:  0.24 m  Systemic Diam: 1.80 cm Skeet Latch MD Electronically signed by Skeet Latch MD Signature Date/Time: 01/04/2022/5:53:23 PM    Final    CT Angio Chest Pulmonary Embolism (PE) W or WO Contrast  Result Date: 01/04/2022 CLINICAL DATA:  Rule out pulmonary embolus. Chest pain. Shortness of breath. EXAM: CT ANGIOGRAPHY CHEST WITH CONTRAST TECHNIQUE: Multidetector CT imaging of the chest was performed using the standard protocol during bolus administration of intravenous contrast. Multiplanar CT image reconstructions and MIPs were obtained to evaluate the vascular anatomy. RADIATION DOSE REDUCTION: This exam was performed according to the departmental dose-optimization program which includes automated exposure control, adjustment of the mA and/or kV according to patient size and/or use of iterative reconstruction technique. CONTRAST:  30m OMNIPAQUE IOHEXOL 350 MG/ML SOLN  COMPARISON:  None Available. FINDINGS: Cardiovascular: Satisfactory opacification of the pulmonary arteries to the segmental level. No evidence of pulmonary embolism. Normal heart size. Aortic atherosclerotic calcifications and coronary artery calcifications. No pericardial effusion. Mediastinum/Nodes: Thyroid gland, trachea are unremarkable. There is a large hiatal hernia identified which contains at least 50% intrathoracic stomach. Circumferential wall thickening of the distal esophagus just above the hiatal hernia is noted which may reflect esophagitis. No enlarged axillary, supraclavicular, mediastinal or hilar lymph nodes. Lungs/Pleura: Small bilateral pleural effusions are identified. There is diffuse interlobular septal thickening and ground-glass attenuation bilaterally concerning for interstitial edema. Atelectasis is noted within the posterior lower lobes overlying both pleural effusions. There is also atelectasis versus scarring noted within the inferior lingula. A few scattered patchy airspace densities are noted within the posterolateral right upper lobe and superior segment of right lower lobe. Within the medial apical segment of the right upper lobe there is a subpleural nodular density measuring 1.8 by 1.0 by 0.8 cm, image 23/5 and image 76/8. This appears similar to cervical spine CT from 02/14/2021 and is favored to represent an area of pleuroparenchymal scarring. Upper Abdomen: Large stone is identified within the gallbladder measuring 2.5 cm. This is only partially visualized. No acute abnormality. Musculoskeletal: Diffuse osteopenia. Midthoracic spine compression deformity is stable compared with chest radiograph from 06/17/2021. Review of the MIP images confirms the above findings. IMPRESSION: 1. No evidence for acute pulmonary embolism. 2. Diffuse interlobular septal thickening and ground-glass attenuation bilaterally concerning for interstitial edema. 3. Small bilateral pleural effusions with  overlying atelectasis. 4. A few scattered patchy airspace densities are noted within the right lung. Findings may reflect inflammation or infection versus asymmetric areas of alveolar edema. 5. Large hiatal hernia. Circumferential wall thickening of the distal esophagus just above the hiatal hernia may reflect esophagitis. 6. Cholelithiasis. 7. Stable midthoracic spine compression deformity. 8. Coronary artery calcifications. 9. Aortic Atherosclerosis (ICD10-I70.0). Electronically Signed   By: TKerby MoorsM.D.   On: 01/04/2022 10:24   DG Chest Port 1 View  Result Date: 01/04/2022 CLINICAL DATA:  Shortness of breath EXAM: PORTABLE CHEST 1 VIEW COMPARISON:  06/17/2021 FINDINGS: Cardiac shadow is enlarged but stable. Aortic calcifications are again seen. Large hiatal hernia is again noted. The lungs are well aerated bilaterally. Patchy airspace opacity is noted in the left base likely related to acute infiltrate. Mild central vascular congestion is noted. No bony abnormality is seen. IMPRESSION: Central vascular congestion. Left basilar infiltrate consistent with acute pneumonia. Electronically Signed   By: MInez CatalinaM.D.   On: 01/04/2022 00:19     Scheduled Meds:  aspirin EC  81 mg Oral Daily   atorvastatin  20 mg Oral Daily   folic acid  1 mg  Oral Daily   furosemide  40 mg Intravenous BID   heparin  3,000 Units Intravenous Once   multivitamin with minerals  1 tablet Oral Daily   sodium chloride flush  3 mL Intravenous Q12H   thiamine  100 mg Oral Daily   Or   thiamine  100 mg Intravenous Daily   Continuous Infusions:  sodium chloride     amiodarone 60 mg/hr (01/05/22 1003)   amiodarone     heparin 900 Units/hr (01/05/22 0547)     LOS: 1 day   Time spent: [place], MD [facility] To contact the attending provider between 7A-7P or the covering provider during after hours 7P-7A, please log into the web site [email or web address] and access using universal [place]  password for that web site. If you do not have the password, please call the hospital operator.  01/05/2022, 10:54 AM

## 2022-01-05 NOTE — CV Procedure (Signed)
We did not perform cath due to new lethargy and weakness.

## 2022-01-05 NOTE — Evaluation (Signed)
OT Cancellation Note  Patient Details Name: Ellen Chandler MRN: [id number] DOB: 1931-03-15   Cancelled Treatment:    Reason Eval/Treat Not Completed: Medical issues which prohibited therapy  Per nursing eval held in the am secondary to pt's HR and not feeling well.  Now gone to cath lab for procedure and coming back.  Nursing still recommending to hold eval today.  Will re-attempt tomorrow.  [name] OTR/L 01/05/2022, 2:41 PM

## 2022-01-05 NOTE — Progress Notes (Signed)
    Patient developed lethargy through the day.  The patient thought it might be due to the amiodarone drip although I think this is very unlikely.  She admits that she has not been sleeping well.  She has not had much to eat today.  Her exam is nonfocal.  There is no paralysis although she states she is very weak.  We will check a basic metabolic profile.  She may be hypoglycemic.  DC amio.    Helle with rapid response has evaluated.  Agrees that she does not have any focal neuro abnormalities to suggest an acute stroke  Able to speak .  Eyes closes, movement is slow and weak  Able to squeeze both hands  Facial nerves are intact, no facial drooping  Will have to allow her to stabilize before we can perform a cath .    Mertie Moores, MD  01/05/2022 3:40 PM    [facility] [place],  [address], [place]  [address] Phone: [phone]; Fax: [phone]

## 2022-01-05 NOTE — Progress Notes (Addendum)
Rounding Note    Patient Name: Ellen Chandler Date of Encounter: 01/05/2022  [place] Cardiologist: Mertie Moores, MD   Subjective   Breathing improved. No chest pain. Sob at bedside. Cardizem stopped due to soft blood pressure.   Inpatient Medications    Scheduled Meds:  aspirin EC  81 mg Oral Daily   folic acid  1 mg Oral Daily   furosemide  40 mg Intravenous BID   heparin  3,000 Units Intravenous Once   levalbuterol  0.63 mg Nebulization Q6H   multivitamin with minerals  1 tablet Oral Daily   thiamine  100 mg Oral Daily   Or   thiamine  100 mg Intravenous Daily   Continuous Infusions:  diltiazem (CARDIZEM) infusion 5 mg/hr (01/04/22 1849)   heparin 900 Units/hr (01/05/22 0547)   PRN Meds: acetaminophen **OR** acetaminophen, alum & mag hydroxide-simeth, alum & mag hydroxide-simeth, LORazepam **OR** LORazepam, ondansetron **OR** ondansetron (ZOFRAN) IV   Vital Signs    Vitals:   01/04/22 2353 01/05/22 0254 01/05/22 0358 01/05/22 0730  BP: (!) 84/57  (!) 88/52 112/71  Pulse: 94  100 (!) 112  Resp: '17  16 16  '$ Temp: 98.5 F (36.9 C)  98.1 F (36.7 C) 98.4 F (36.9 C)  TempSrc: Oral  Oral Oral  SpO2: 97% 98% 96% 98%  Weight:      Height:        Intake/Output Summary (Last 24 hours) at 01/05/2022 0904 Last data filed at 01/05/2022 0358 Gross per 24 hour  Intake --  Output 1100 ml  Net -1100 ml      01/04/2022    9:02 PM 01/04/2022   12:19 AM 06/17/2021   11:03 AM  Last 3 Weights  Weight (lbs) 134 lb 7.7 oz 134 lb 7.7 oz 135 lb  Weight (kg) 61 kg 61 kg 61.236 kg      Telemetry    Atrial fibrillation at 120-130s - Personally Reviewed  ECG   N/A  Physical Exam   GEN: No acute distress.   Neck: No JVD Cardiac: IR IR tachycardic, no murmurs, rubs, or gallops.  Respiratory: course breath sound  GI: Soft, nontender, non-distended  MS: No edema; No deformity. Neuro:  Nonfocal  Psych: Normal affect   Labs    High Sensitivity  Troponin:   Recent Labs  Lab 01/04/22 0416 01/04/22 0932 01/04/22 1145  TROPONINIHS 123* 128* 102*     Chemistry Recent Labs  Lab 01/04/22 0006 01/04/22 0039 01/04/22 0416 01/05/22 0434  NA 135 135 136 136  K 3.9 3.9 4.4 4.0  CL 103  --  102 99  CO2 22  --  23 28  GLUCOSE 152*  --  103* 95  BUN 29*  --  27* 21  CREATININE 1.03*  --  0.89 1.03*  CALCIUM 8.9  --  8.9 9.4  MG  --   --   --  2.0  PROT 6.1*  --  5.3* 5.7*  ALBUMIN 3.6  --  3.2* 3.1*  AST 28  --  23 19  ALT 30  --  26 22  ALKPHOS 36*  --  29* 30*  BILITOT 0.7  --  0.7 0.8  GFRNONAA 52*  --  >60 52*  ANIONGAP 10  --  11 9    Lipids  Recent Labs  Lab 01/05/22 0434  CHOL 121  TRIG 56  HDL 40*  LDLCALC 70  CHOLHDL 3.0    Hematology Recent Labs  Lab 01/04/22 0006 01/04/22 0039 01/04/22 0416 01/05/22 0434  WBC 8.7  --  8.6 7.0  RBC 3.96  --  3.49* 3.62*  HGB 13.4 13.3 11.6* 12.1  HCT 39.3 39.0 35.1* 35.2*  MCV 99.2  --  100.6* 97.2  MCH 33.8  --  33.2 33.4  MCHC 34.1  --  33.0 34.4  RDW 12.0  --  12.1 11.9  PLT 318  --  266 269   Thyroid No results for input(s): "TSH", "FREET4" in the last 168 hours.  BNP Recent Labs  Lab 01/04/22 0006  BNP 519.6*    DDimer No results for input(s): "DDIMER" in the last 168 hours.   Radiology    ECHOCARDIOGRAM COMPLETE  Result Date: 01/04/2022    ECHOCARDIOGRAM REPORT   Patient Name:   [name] Date of Exam: 01/04/2022 Medical Rec #:  [id number]         Height:       64.0 in Accession #:    [id number]        Weight:       134.5 lb Date of Birth:  [birth year]         BSA:          1.653 m Patient Age:    56 years          BP:           129/82 mmHg Patient Gender: F                 HR:           108 bpm. Exam Location:  Inpatient Procedure: 2D Echo, Cardiac Doppler and Color Doppler Indications:    CHF-Acute Diastolic M57.84  History:        Patient has prior history of Echocardiogram examinations, most                 recent 05/05/2013. CHF, Angina;  Risk Factors:Hypertension.  Sonographer:    Ronny Flurry Referring Phys: [place]  1. Left ventricular ejection fraction, by estimation, is 40 to 45%. The left ventricle has mildly decreased function. The left ventricle has no regional wall motion abnormalities. Left ventricular diastolic parameters are consistent with Grade I diastolic dysfunction (impaired relaxation). Elevated left ventricular end-diastolic pressure.  2. Right ventricular systolic function is normal. The right ventricular size is normal. There is normal pulmonary artery systolic pressure.  3. Left atrial size was moderately dilated.  4. The mitral valve is normal in structure. Trivial mitral valve regurgitation. No evidence of mitral stenosis.  5. The aortic valve is tricuspid. There is mild calcification of the aortic valve. There is mild thickening of the aortic valve. Aortic valve regurgitation is trivial. No aortic stenosis is present.  6. The inferior vena cava is normal in size with greater than 50% respiratory variability, suggesting right atrial pressure of 3 mmHg. FINDINGS  Left Ventricle: Left ventricular ejection fraction, by estimation, is 40 to 45%. The left ventricle has mildly decreased function. The left ventricle has no regional wall motion abnormalities. The left ventricular internal cavity size was normal in size. There is no left ventricular hypertrophy. Left ventricular diastolic parameters are consistent with Grade I diastolic dysfunction (impaired relaxation). Elevated left ventricular end-diastolic pressure.  LV Wall Scoring: The entire septum, apical anterior segment, apical inferior segment, and apex are hypokinetic. The anterior wall, entire lateral wall, and inferior wall are normal. Right Ventricle: The right ventricular size is normal. No increase  in right ventricular wall thickness. Right ventricular systolic function is normal. There is normal pulmonary artery systolic pressure. The  tricuspid regurgitant velocity is 1.57 m/s, and  with an assumed right atrial pressure of 3 mmHg, the estimated right ventricular systolic pressure is 97.3 mmHg. Left Atrium: Left atrial size was moderately dilated. Right Atrium: Right atrial size was normal in size. Pericardium: There is no evidence of pericardial effusion. Mitral Valve: The mitral valve is normal in structure. Trivial mitral valve regurgitation. No evidence of mitral valve stenosis. Tricuspid Valve: The tricuspid valve is normal in structure. Tricuspid valve regurgitation is trivial. No evidence of tricuspid stenosis. Aortic Valve: The aortic valve is tricuspid. There is mild calcification of the aortic valve. There is mild thickening of the aortic valve. Aortic valve regurgitation is trivial. No aortic stenosis is present. Pulmonic Valve: The pulmonic valve was normal in structure. Pulmonic valve regurgitation is not visualized. No evidence of pulmonic stenosis. Aorta: The aortic root is normal in size and structure. Venous: The inferior vena cava is normal in size with greater than 50% respiratory variability, suggesting right atrial pressure of 3 mmHg. IAS/Shunts: No atrial level shunt detected by color flow Doppler.  LEFT VENTRICLE PLAX 2D LVIDd:         4.80 cm   Diastology LVIDs:         3.80 cm   LV e' medial:   7.46 cm/s LV PW:         0.90 cm   LV E/e' medial: 17.0 LV IVS:        0.70 cm LVOT diam:     1.80 cm LV SV:         62 LV SV Index:   38 LVOT Area:     2.54 cm  RIGHT VENTRICLE RV S prime:     11.90 cm/s TAPSE (M-mode): 2.4 cm LEFT ATRIUM             Index        RIGHT ATRIUM           Index LA diam:        4.00 cm 2.42 cm/m   RA Area:     14.20 cm LA Vol (A2C):   61.2 ml 37.03 ml/m  RA Volume:   33.20 ml  20.09 ml/m LA Vol (A4C):   55.3 ml 33.46 ml/m LA Biplane Vol: 59.9 ml 36.24 ml/m  AORTIC VALVE LVOT Vmax:   107.00 cm/s LVOT Vmean:  73.600 cm/s LVOT VTI:    0.245 m  AORTA Ao Root diam: 3.30 cm Ao Asc diam:  3.40 cm  MITRAL VALVE                TRICUSPID VALVE MV Area (PHT): 4.24 cm     TR Peak grad:   9.9 mmHg MV Decel Time: 179 msec     TR Vmax:        157.00 cm/s MV E velocity: 127.00 cm/s MV A velocity: 132.00 cm/s  SHUNTS MV E/A ratio:  0.96         Systemic VTI:  0.24 m                             Systemic Diam: 1.80 cm Skeet Latch MD Electronically signed by Skeet Latch MD Signature Date/Time: 01/04/2022/5:53:23 PM    Final    CT Angio Chest Pulmonary Embolism (PE) W or WO Contrast  Result Date: 01/04/2022  CLINICAL DATA:  Rule out pulmonary embolus. Chest pain. Shortness of breath. EXAM: CT ANGIOGRAPHY CHEST WITH CONTRAST TECHNIQUE: Multidetector CT imaging of the chest was performed using the standard protocol during bolus administration of intravenous contrast. Multiplanar CT image reconstructions and MIPs were obtained to evaluate the vascular anatomy. RADIATION DOSE REDUCTION: This exam was performed according to the departmental dose-optimization program which includes automated exposure control, adjustment of the mA and/or kV according to patient size and/or use of iterative reconstruction technique. CONTRAST:  9m OMNIPAQUE IOHEXOL 350 MG/ML SOLN COMPARISON:  None Available. FINDINGS: Cardiovascular: Satisfactory opacification of the pulmonary arteries to the segmental level. No evidence of pulmonary embolism. Normal heart size. Aortic atherosclerotic calcifications and coronary artery calcifications. No pericardial effusion. Mediastinum/Nodes: Thyroid gland, trachea are unremarkable. There is a large hiatal hernia identified which contains at least 50% intrathoracic stomach. Circumferential wall thickening of the distal esophagus just above the hiatal hernia is noted which may reflect esophagitis. No enlarged axillary, supraclavicular, mediastinal or hilar lymph nodes. Lungs/Pleura: Small bilateral pleural effusions are identified. There is diffuse interlobular septal thickening and ground-glass  attenuation bilaterally concerning for interstitial edema. Atelectasis is noted within the posterior lower lobes overlying both pleural effusions. There is also atelectasis versus scarring noted within the inferior lingula. A few scattered patchy airspace densities are noted within the posterolateral right upper lobe and superior segment of right lower lobe. Within the medial apical segment of the right upper lobe there is a subpleural nodular density measuring 1.8 by 1.0 by 0.8 cm, image 23/5 and image 76/8. This appears similar to cervical spine CT from 02/14/2021 and is favored to represent an area of pleuroparenchymal scarring. Upper Abdomen: Large stone is identified within the gallbladder measuring 2.5 cm. This is only partially visualized. No acute abnormality. Musculoskeletal: Diffuse osteopenia. Midthoracic spine compression deformity is stable compared with chest radiograph from 06/17/2021. Review of the MIP images confirms the above findings. IMPRESSION: 1. No evidence for acute pulmonary embolism. 2. Diffuse interlobular septal thickening and ground-glass attenuation bilaterally concerning for interstitial edema. 3. Small bilateral pleural effusions with overlying atelectasis. 4. A few scattered patchy airspace densities are noted within the right lung. Findings may reflect inflammation or infection versus asymmetric areas of alveolar edema. 5. Large hiatal hernia. Circumferential wall thickening of the distal esophagus just above the hiatal hernia may reflect esophagitis. 6. Cholelithiasis. 7. Stable midthoracic spine compression deformity. 8. Coronary artery calcifications. 9. Aortic Atherosclerosis (ICD10-I70.0). Electronically Signed   By: TKerby MoorsM.D.   On: 01/04/2022 10:24   DG Chest Port 1 View  Result Date: 01/04/2022 CLINICAL DATA:  Shortness of breath EXAM: PORTABLE CHEST 1 VIEW COMPARISON:  06/17/2021 FINDINGS: Cardiac shadow is enlarged but stable. Aortic calcifications are again  seen. Large hiatal hernia is again noted. The lungs are well aerated bilaterally. Patchy airspace opacity is noted in the left base likely related to acute infiltrate. Mild central vascular congestion is noted. No bony abnormality is seen. IMPRESSION: Central vascular congestion. Left basilar infiltrate consistent with acute pneumonia. Electronically Signed   By: MInez CatalinaM.D.   On: 01/04/2022 00:19    Cardiac Studies   Echo 01/04/2022  1. Left ventricular ejection fraction, by estimation, is 40 to 45%. The  left ventricle has mildly decreased function. The left ventricle has no  regional wall motion abnormalities. Left ventricular diastolic parameters  are consistent with Grade I  diastolic dysfunction (impaired relaxation). Elevated left ventricular  end-diastolic pressure.   2. Right ventricular systolic function  is normal. The right ventricular  size is normal. There is normal pulmonary artery systolic pressure.   3. Left atrial size was moderately dilated.   4. The mitral valve is normal in structure. Trivial mitral valve  regurgitation. No evidence of mitral stenosis.   5. The aortic valve is tricuspid. There is mild calcification of the  aortic valve. There is mild thickening of the aortic valve. Aortic valve  regurgitation is trivial. No aortic stenosis is present.   6. The inferior vena cava is normal in size with greater than 50%  respiratory variability, suggesting right atrial pressure of 3 mmHg.   FINDINGS   Left Ventricle: Left ventricular ejection fraction, by estimation, is 40  to 45%. The left ventricle has mildly decreased function. The left  ventricle has no regional wall motion abnormalities. The left ventricular  internal cavity size was normal in  size. There is no left ventricular hypertrophy. Left ventricular diastolic  parameters are consistent with Grade I diastolic dysfunction (impaired  relaxation). Elevated left ventricular end-diastolic pressure.       LV Wall Scoring:  The entire septum, apical anterior segment, apical inferior segment, and  apex  are hypokinetic. The anterior wall, entire lateral wall, and inferior wall  are normal.   Patient Profile     88 y.o. female  hypertension, HFpEF, GERD, hypothyroidism who is being seen 01/04/2022 for the evaluation of CHF at the request of Dr. Verlon Au.  Assessment & Plan    1.  Acute pulmonary edema 2.  Acute hypoxic respiratory 3.  Acute systolic heart failure -  BNP 514.chest x-ray suggestive of pneumonia. CT angio chest with small bilateral effusions.  - Initially required BiPAP.  Now weaned off. -Started on IV Lasix with improved breathing. -Echocardiogram showing LV function of 40 to 45% and grade 1 diastolic dysfunction.  Hypokinetic septum, apical anterior segment, apical inferior segment and apex. -Need I&O 1.1 L. + 400 cc in urinal. -We will add directed medical therapy as lungs improves and post catheterization.  4.  Atrial fibrillation with rapid ventricular rate -Patient with longstanding history of irregular heart rate for 30 years but never found to have atrial fibrillation. -She was started on IV Cardizem but stopped due to hypotension.  Also not a good drug with low EF. -Given elevated heart rate, will start IV amiodarone -On heparin for anticoagulation.  Switch to oral postprocedure. -Check TSH  This patients CHA2DS2-VASc Score and unadjusted Ischemic Stroke Rate (% per year) is equal to 7.2 % stroke rate/year from a score of 5  Above score calculated as 1 point each if present [CHF, HTN, DM, Vascular=MI/PAD/Aortic Plaque, Age if 65-74, or Female] Above score calculated as 2 points each if present [Age > 75, or Stroke/TIA/TE]      5.  Elevated troponin -Peaked at 128.  This is could be demand ischemia.  Given low LV function recommended cardiac cath.  Continue aspirin and heparin.  Add Lipitor '20mg'$  qd.  -Patient lives at independent facility.  Use walker for  ambulation.  She is functional. -Son was present during discussion.  The patient understands that risks include but are not limited to stroke (1 in 1000), death (1 in 54), kidney failure [usually temporary] (1 in 500), bleeding (1 in 200), allergic reaction [possibly serious] (1 in 200), and agrees to proceed.     6.  Pneumonia -Per primary team  7. HTN - Norvasc on hold. Will change to BB prior to discharge.   For questions  or updates, please contact [place] Please consult [email or web address] for contact info under        SignedLeanor Kail, PA  01/05/2022, 9:04 AM      Attending Note:   The patient was seen and examined.  Agree with assessment and plan as noted above.  Changes made to the above note as needed.  Patient seen and independently examined with Robbie Lis, PA .   We discussed all aspects of the encounter. I agree with the assessment and plan as stated above.    NSTEMI:   troponins are elevated and trending downward.   She has  LVEF of 40-45% with grade I DD.   By may review of her echo, she has anterioseptal hypokinesis / akinesis.   My hope is that she will have a lesion that is amenable to stenting.  She is 88 years old and may not be a good candidate for bypass grafting.  We will make that decision following heart catheterization.  2.  Acute on chronic congestive heart failure: She has improved with diuresis.  Continue current medications.  3.  Persistent atrial fibrillation: He is got into atrial fibrillation since she arrived in the ER.  She did not tolerate the diltiazem drip due to hypotension.  We will start her on amiodarone.   I have spent a total of 40 minutes with patient reviewing hospital  notes , telemetry, EKGs, labs and examining patient as well as establishing an assessment and plan that was discussed with the patient.  > 50% of time was spent in direct patient care.   Thayer Headings, Brooke Bonito., MD, [facility] 01/05/2022, 9:29 AM [address],  [place] Pager [phone]

## 2022-01-05 NOTE — Significant Event (Signed)
Rapid Response Event Note   Reason for Call :  Increased lethargy  Initial Focused Assessment:  Patient is lying in the bed.  She has generalized weakness.  She is communicating clearly with staff but she states that she feels very weak and is nauseated. Per RN the changed happened shortly after starting Amiodarone gtt this morning.  She has episode of dry heaving but no vomiting.  Patient states that she feels poorly. No focal deficit noted  BP 124/66  AF 94-110  RR 17  O2 sat 98% on 2L [place]  Temp 98 Lung sounds with scatter rhonchi Heart tones irregular  Dr Acie Fredrickson at bedside to assess patient Dr Verlon Au at bedside prior to my arrival  Interventions:  Amiodarone gtt stopped   Plan of Care:   FU 1115:  Patient states that she feels a little better, she is a little more interactive per RN.    RN to call if she doesn't continue to improve or if her VS become unstable.     Event Summary:   MD Notified: Dr Verlon Au & Dr Acie Fredrickson Call Time: 5208 Arrival Time: 0223 End Time: [place]  Raliegh Ip, RN

## 2022-01-05 NOTE — Progress Notes (Signed)
Patient admitted to 4E.  Alert and oriented x 3.  No complaints of pain, chest pain or distress  VSS  Patient has Heparin gtt @ 7.5cc/hr and Cardizem '@5cc'$ /hr.  CHG bath given  Call bell placed within patient's reached.

## 2022-01-05 NOTE — Progress Notes (Signed)
Cardizem gtt d/ced because patient's BP 84/57 Awaiting Dr Marlowe Sax return call.

## 2022-01-06 DIAGNOSIS — I214 Non-ST elevation (NSTEMI) myocardial infarction: Secondary | ICD-10-CM

## 2022-01-06 LAB — COMPREHENSIVE METABOLIC PANEL
ALT: 22 U/L (ref 0–44)
AST: 21 U/L (ref 15–41)
Albumin: 3.6 g/dL (ref 3.5–5.0)
Alkaline Phosphatase: 35 U/L — ABNORMAL LOW (ref 38–126)
Anion gap: 11 (ref 5–15)
BUN: 20 mg/dL (ref 8–23)
CO2: 28 mmol/L (ref 22–32)
Calcium: 9.6 mg/dL (ref 8.9–10.3)
Chloride: 91 mmol/L — ABNORMAL LOW (ref 98–111)
Creatinine, Ser: 0.85 mg/dL (ref 0.44–1.00)
GFR, Estimated: >60 mL/min (ref >60)
Glucose, Bld: 122 mg/dL — ABNORMAL HIGH (ref 70–99)
Potassium: 3.6 mmol/L (ref 3.5–5.1)
Sodium: 130 mmol/L — ABNORMAL LOW (ref 135–145)
Total Bilirubin: 0.7 mg/dL (ref 0.3–1.2)
Total Protein: 6.4 g/dL — ABNORMAL LOW (ref 6.5–8.1)

## 2022-01-06 LAB — CBC
HCT: 37 % (ref 36.0–46.0)
Hemoglobin: 13.3 g/dL (ref 12.0–15.0)
MCH: 33.7 pg (ref 26.0–34.0)
MCHC: 35.9 g/dL (ref 30.0–36.0)
MCV: 93.7 fL (ref 80.0–100.0)
Platelets: 308 10*3/uL (ref 150–400)
RBC: 3.95 MIL/uL (ref 3.87–5.11)
RDW: 11.4 % — ABNORMAL LOW (ref 11.5–15.5)
WBC: 10.2 10*3/uL (ref 4.0–10.5)
nRBC: 0 % (ref 0.0–0.2)

## 2022-01-06 LAB — HEPARIN LEVEL (UNFRACTIONATED): Heparin Unfractionated: 0.32 IU/mL (ref 0.30–0.70)

## 2022-01-06 MED ORDER — POLYETHYLENE GLYCOL 3350 17 G PO PACK
17.0000 g | PACK | Freq: Every day | ORAL | Status: DC
  Administered 2022-01-07 – 2022-01-11 (×3): 17 g via ORAL
  Filled 2022-01-06 (×3): qty 1

## 2022-01-06 NOTE — Progress Notes (Signed)
Progress Note  Patient Name: Ellen Chandler Date of Encounter: 01/06/2022  Primary Cardiologist:   Mertie Moores, MD   Subjective   She is weak but no pain.  Breathing is "ok."    Inpatient Medications    Scheduled Meds:  aspirin EC  81 mg Oral Daily   atorvastatin  20 mg Oral Daily   furosemide  40 mg Intravenous BID   heparin  3,000 Units Intravenous Once   sodium chloride flush  3 mL Intravenous Q12H   Continuous Infusions:  heparin 900 Units/hr (01/06/22 0145)   PRN Meds: acetaminophen **OR** acetaminophen, alum & mag hydroxide-simeth, alum & mag hydroxide-simeth, levalbuterol, ondansetron **OR** ondansetron (ZOFRAN) IV   Vital Signs    Vitals:   01/06/22 0027 01/06/22 0400 01/06/22 0748 01/06/22 1117  BP: 109/66 108/69 136/87 126/63  Pulse: 90 78 100 94  Resp: '16 20 17 20  '$ Temp: 97.8 F (36.6 C) 98.3 F (36.8 C) 97.7 F (36.5 C) 97.8 F (36.6 C)  TempSrc: Oral Oral Oral Oral  SpO2: 98% 97% 99% 94%  Weight:  61 kg    Height:        Intake/Output Summary (Last 24 hours) at 01/06/2022 1124 Last data filed at 01/05/2022 1949 Gross per 24 hour  Intake --  Output 700 ml  Net -700 ml   Filed Weights   01/04/22 0019 01/04/22 2102 01/06/22 0400  Weight: 61 kg 61 kg 61 kg    Telemetry    Atrial fib with controlled ventricular rate, PVCs - Personally Reviewed  ECG    NA - Personally Reviewed  Physical Exam   GEN: No acute distress.   Very frail Neck: No  JVD Cardiac: Irregular RR, no murmurs, rubs, or gallops.  Respiratory:       Decreased breath sounds with scattered wheezes.  GI: Soft, nontender, non-distended  MS:    Mild non pitting edema; No deformity. Neuro:  Nonfocal  Psych: Normal affect   Labs    Chemistry Recent Labs  Lab 01/04/22 0416 01/05/22 0434 01/06/22 0159  NA 136 136 130*  K 4.4 4.0 3.6  CL 102 99 91*  CO2 '23 28 28  '$ GLUCOSE 103* 95 122*  BUN 27* 21 20  CREATININE 0.89 1.03* 0.85  CALCIUM 8.9 9.4 9.6  PROT 5.3*  5.7* 6.4*  ALBUMIN 3.2* 3.1* 3.6  AST '23 19 21  '$ ALT '26 22 22  '$ ALKPHOS 29* 30* 35*  BILITOT 0.7 0.8 0.7  GFRNONAA >60 52* >60  ANIONGAP '11 9 11     '$ Hematology Recent Labs  Lab 01/04/22 0416 01/05/22 0434 01/06/22 0159  WBC 8.6 7.0 10.2  RBC 3.49* 3.62* 3.95  HGB 11.6* 12.1 13.3  HCT 35.1* 35.2* 37.0  MCV 100.6* 97.2 93.7  MCH 33.2 33.4 33.7  MCHC 33.0 34.4 35.9  RDW 12.1 11.9 11.4*  PLT 266 269 308    Cardiac EnzymesNo results for input(s): "TROPONINI" in the last 168 hours. No results for input(s): "TROPIPOC" in the last 168 hours.   BNP Recent Labs  Lab 01/04/22 0006  BNP 519.6*     DDimer No results for input(s): "DDIMER" in the last 168 hours.   Radiology    CARDIAC CATHETERIZATION  Result Date: 01/05/2022 Cardiac cath cancelled due to lethargy and change in level of consciousness.   ECHOCARDIOGRAM COMPLETE  Result Date: 01/04/2022    ECHOCARDIOGRAM REPORT   Patient Name:   Ellen Chandler Date of Exam: 01/04/2022 Medical Rec #:  [id number]         Height:       64.0 in Accession #:    [id number]        Weight:       134.5 lb Date of Birth:  25-Sep-1930         BSA:          1.653 m Patient Age:    55 years          BP:           129/82 mmHg Patient Gender: F                 HR:           108 bpm. Exam Location:  Inpatient Procedure: 2D Echo, Cardiac Doppler and Color Doppler Indications:    CHF-Acute Diastolic N05.39  History:        Patient has prior history of Echocardiogram examinations, most                 recent 05/05/2013. CHF, Angina; Risk Factors:Hypertension.  Sonographer:    Ronny Flurry Referring Phys: [place]  1. Left ventricular ejection fraction, by estimation, is 40 to 45%. The left ventricle has mildly decreased function. The left ventricle has no regional wall motion abnormalities. Left ventricular diastolic parameters are consistent with Grade I diastolic dysfunction (impaired relaxation). Elevated left ventricular  end-diastolic pressure.  2. Right ventricular systolic function is normal. The right ventricular size is normal. There is normal pulmonary artery systolic pressure.  3. Left atrial size was moderately dilated.  4. The mitral valve is normal in structure. Trivial mitral valve regurgitation. No evidence of mitral stenosis.  5. The aortic valve is tricuspid. There is mild calcification of the aortic valve. There is mild thickening of the aortic valve. Aortic valve regurgitation is trivial. No aortic stenosis is present.  6. The inferior vena cava is normal in size with greater than 50% respiratory variability, suggesting right atrial pressure of 3 mmHg. FINDINGS  Left Ventricle: Left ventricular ejection fraction, by estimation, is 40 to 45%. The left ventricle has mildly decreased function. The left ventricle has no regional wall motion abnormalities. The left ventricular internal cavity size was normal in size. There is no left ventricular hypertrophy. Left ventricular diastolic parameters are consistent with Grade I diastolic dysfunction (impaired relaxation). Elevated left ventricular end-diastolic pressure.  LV Wall Scoring: The entire septum, apical anterior segment, apical inferior segment, and apex are hypokinetic. The anterior wall, entire lateral wall, and inferior wall are normal. Right Ventricle: The right ventricular size is normal. No increase in right ventricular wall thickness. Right ventricular systolic function is normal. There is normal pulmonary artery systolic pressure. The tricuspid regurgitant velocity is 1.57 m/s, and  with an assumed right atrial pressure of 3 mmHg, the estimated right ventricular systolic pressure is 76.7 mmHg. Left Atrium: Left atrial size was moderately dilated. Right Atrium: Right atrial size was normal in size. Pericardium: There is no evidence of pericardial effusion. Mitral Valve: The mitral valve is normal in structure. Trivial mitral valve regurgitation. No evidence of  mitral valve stenosis. Tricuspid Valve: The tricuspid valve is normal in structure. Tricuspid valve regurgitation is trivial. No evidence of tricuspid stenosis. Aortic Valve: The aortic valve is tricuspid. There is mild calcification of the aortic valve. There is mild thickening of the aortic valve. Aortic valve regurgitation is trivial. No aortic stenosis is present. Pulmonic Valve: The pulmonic valve was normal in  structure. Pulmonic valve regurgitation is not visualized. No evidence of pulmonic stenosis. Aorta: The aortic root is normal in size and structure. Venous: The inferior vena cava is normal in size with greater than 50% respiratory variability, suggesting right atrial pressure of 3 mmHg. IAS/Shunts: No atrial level shunt detected by color flow Doppler.  LEFT VENTRICLE PLAX 2D LVIDd:         4.80 cm   Diastology LVIDs:         3.80 cm   LV e' medial:   7.46 cm/s LV PW:         0.90 cm   LV E/e' medial: 17.0 LV IVS:        0.70 cm LVOT diam:     1.80 cm LV SV:         62 LV SV Index:   38 LVOT Area:     2.54 cm  RIGHT VENTRICLE RV S prime:     11.90 cm/s TAPSE (M-mode): 2.4 cm LEFT ATRIUM             Index        RIGHT ATRIUM           Index LA diam:        4.00 cm 2.42 cm/m   RA Area:     14.20 cm LA Vol (A2C):   61.2 ml 37.03 ml/m  RA Volume:   33.20 ml  20.09 ml/m LA Vol (A4C):   55.3 ml 33.46 ml/m LA Biplane Vol: 59.9 ml 36.24 ml/m  AORTIC VALVE LVOT Vmax:   107.00 cm/s LVOT Vmean:  73.600 cm/s LVOT VTI:    0.245 m  AORTA Ao Root diam: 3.30 cm Ao Asc diam:  3.40 cm MITRAL VALVE                TRICUSPID VALVE MV Area (PHT): 4.24 cm     TR Peak grad:   9.9 mmHg MV Decel Time: 179 msec     TR Vmax:        157.00 cm/s MV E velocity: 127.00 cm/s MV A velocity: 132.00 cm/s  SHUNTS MV E/A ratio:  0.96         Systemic VTI:  0.24 m                             Systemic Diam: 1.80 cm Skeet Latch MD Electronically signed by Skeet Latch MD Signature Date/Time: 01/04/2022/5:53:23 PM    Final      Cardiac Studies   Echo as above.     Patient Profile     88 y.o. female   hypertension, HFpEF, GERD, hypothyroidism who is being seen 01/04/2022 for the evaluation of CHF at the request of Dr. Verlon Au.    Assessment & Plan    Acute hypoxic respiratory failure:  Off O2 and oxygenating OK.     Net negative is 1.8 liters.    She seems to be tolerating diuresis.  Na is low so keep free water restricted.  Follow.   Atrial fib with RVR:  She does not want IV amiodarone any longer.  This is off.  Rate seems to be OK.  BP is labile but we might be able to start low dose beta blocker tomorrow.  Hold off on DOAC until we make a final decision about cath.     Elevated troponin:  Cath was held secondary to lethargy and weakness.  I am inclined  not to send her for cath next week but we can see how she does over the weekend.     HTN:  This is being managed in the context of treating his CHF    For questions or updates, please contact [facility] Please consult [email or web address] for contact info under Cardiology/STEMI.   Signed, Minus Breeding, MD  01/06/2022, 11:24 AM

## 2022-01-06 NOTE — Progress Notes (Signed)
ANTICOAGULATION CONSULT NOTE   Pharmacy Consult for heparin  Indication: chest pain/ACS and atrial fibrillation  No Known Allergies  Patient Measurements: Height: '5\' 4"'$  (162.6 cm) Weight: 61 kg (134 lb 7.7 oz) IBW/kg (Calculated) : 54.7 Heparin Dosing Weight: 61 kg  Vital Signs: Temp: 97.8 F (36.6 C) (09/02 1117) Temp Source: Oral (09/02 1117) BP: 126/63 (09/02 1117) Pulse Rate: 94 (09/02 1117)  Labs: Recent Labs    01/04/22 0416 01/04/22 0932 01/04/22 1145 01/05/22 0434 01/05/22 1227 01/06/22 0159  HGB 11.6*  --   --  12.1  --  13.3  HCT 35.1*  --   --  35.2*  --  37.0  PLT 266  --   --  269  --  308  HEPARINUNFRC  --   --   --  0.28* 0.34 0.32  CREATININE 0.89  --   --  1.03*  --  0.85  TROPONINIHS 123* 128* 102*  --   --   --      Estimated Creatinine Clearance: 39.5 mL/min (by C-G formula based on SCr of 0.85 mg/dL).   Medical History: Past Medical History:  Diagnosis Date   Abnormal ECG    a. Pt aware of long hx of abnl ecg->h/o normal stress testing in [place] ~ 2000.   Diastolic dysfunction    a. 04/2013 Echo: EF 60-65%, No rwma, Gr 1 DD, mild MR.   GERD (gastroesophageal reflux disease)    Hypertension    Hypothyroidism     Medications:  Scheduled:   aspirin EC  81 mg Oral Daily   atorvastatin  20 mg Oral Daily   furosemide  40 mg Intravenous BID   heparin  3,000 Units Intravenous Once   sodium chloride flush  3 mL Intravenous Q12H    Assessment: 88 yo F with PMH HFpEF presents with trouble breathing. Pt does not take anticoagulants at home according to completed med history or recent fill history. Pharmacy is consulted to dose heparin with indication of afib + ACS  Heparin level came back therapeutic at 0.32, on 900 units/hr. Hgb 13.3, plt 308. No s/sx of bleeding or infusion issues per nursing.   Goal of Therapy:  Heparin level 0.3-0.7 units/ml Monitor platelets by anticoagulation protocol: Yes   Plan:  Continue heparin gtt   900 units/hr  Monitor CBC, HL, s/sx bleeding and transition to oral therapies as appropriate.   Antonietta Jewel, PharmD, [place] Clinical Pharmacist  Phone: [phone] 01/06/2022 3:16 PM  Please check AMION for all [facility] phone numbers After 10:00 PM, call [place] [phone]

## 2022-01-06 NOTE — Evaluation (Signed)
Physical Therapy Evaluation Patient Details Name: Ellen Chandler MRN: [id number] DOB: 11-Aug-1930 Today's Date: 01/06/2022  History of Present Illness  88 yo admitted 8/31 from ILF with SOB. Pt with pulmonary edema and CHF with demand ischemia. PMhx: HTN, HFpEF, GERD, hypothyroidism, ETOH abuse  Clinical Impression  Pt reports HA and that medication has made her feel poorly. Pt lives alone in [place] and cares for herself with use of RW. Pt needing assist with all transfers and mobility this date with pt denying attempts for ambulation. Pt with decreased strength, function, and gait who will benefit from acute therapy to maximize mobility, safety and function.   HR 99-104 SpO2 91-96% on RA     Recommendations for follow up therapy are one component of a multi-disciplinary discharge planning process, led by the attending physician.  Recommendations may be updated based on patient status, additional functional criteria and insurance authorization.  Follow Up Recommendations Skilled nursing-short term rehab (<3 hours/day) Can patient physically be transported by private vehicle: No    Assistance Recommended at Discharge Frequent or constant Supervision/Assistance  Patient can return home with the following  A lot of help with walking and/or transfers;A lot of help with bathing/dressing/bathroom;Assistance with cooking/housework;Direct supervision/assist for financial management;Assist for transportation;Direct supervision/assist for medications management    Equipment Recommendations None recommended by PT  Recommendations for Other Services       Functional Status Assessment Patient has had a recent decline in their functional status and demonstrates the ability to make significant improvements in function in a reasonable and predictable amount of time.     Precautions / Restrictions Precautions Precautions: Fall      Mobility  Bed Mobility Overal bed mobility: Needs Assistance Bed  Mobility: Supine to Sit     Supine to sit: Min assist, HOB elevated     General bed mobility comments: HOB 30 degrees, cues for use of rail to roll to side and rise with assist to clear legs off surface and fully elevate trunk to sitting    Transfers Overall transfer level: Needs assistance   Transfers: Sit to/from Stand, Bed to chair/wheelchair/BSC Sit to Stand: Min assist Stand pivot transfers: Mod assist         General transfer comment: physical assist to rise from surface with cues for hand placement and sequence. mod assist with use of RW to pivot from bed to chair with pt declining further mobility    Ambulation/Gait               General Gait Details: pt denied attempting  Stairs            Wheelchair Mobility    Modified Rankin (Stroke Patients Only)       Balance Overall balance assessment: Needs assistance Sitting-balance support: Bilateral upper extremity supported, Feet supported Sitting balance-Leahy Scale: Poor     Standing balance support: Reliant on assistive device for balance, Bilateral upper extremity supported Standing balance-Leahy Scale: Poor                               Pertinent Vitals/Pain Pain Assessment Pain Assessment: 0-10 Pain Score: 4  Pain Location: head Pain Descriptors / Indicators: Aching Pain Intervention(s): Limited activity within patient's tolerance, Monitored during session, RN gave pain meds during session, Repositioned    Home Living Family/patient expects to be discharged to:: Assisted living  Home Equipment: Conservation officer, nature (2 wheels);BSC/3in1      Prior Function Prior Level of Function : Independent/Modified Independent             Mobility Comments: pt reports she walks with a RW, gets rides to the grocery store and has no hx of falls ADLs Comments: staff cleans 1x/month otherwise pt performs all ADLs     Hand Dominance        Extremity/Trunk  Assessment   Upper Extremity Assessment Upper Extremity Assessment: Generalized weakness    Lower Extremity Assessment Lower Extremity Assessment: Generalized weakness    Cervical / Trunk Assessment Cervical / Trunk Assessment: Kyphotic  Communication   Communication: No difficulties  Cognition Arousal/Alertness: Awake/alert Behavior During Therapy: Flat affect Overall Cognitive Status: Impaired/Different from baseline Area of Impairment: Memory, Following commands                     Memory: Decreased short-term memory Following Commands: Follows one step commands inconsistently, Follows one step commands with increased time                General Comments      Exercises     Assessment/Plan    PT Assessment Patient needs continued PT services  PT Problem List Decreased strength;Decreased mobility;Decreased safety awareness;Decreased activity tolerance;Decreased cognition;Decreased balance;Decreased knowledge of use of DME       PT Treatment Interventions DME instruction;Therapeutic exercise;Gait training;Balance training;Functional mobility training;Cognitive remediation;Therapeutic activities;Patient/family education    PT Goals (Current goals can be found in the Care Plan section)  Acute Rehab PT Goals Patient Stated Goal: return home PT Goal Formulation: With patient Time For Goal Achievement: 01/20/22 Potential to Achieve Goals: Fair    Frequency Min 3X/week     Co-evaluation               AM-PAC PT "6 Clicks" Mobility  Outcome Measure Help needed turning from your back to your side while in a flat bed without using bedrails?: A Little Help needed moving from lying on your back to sitting on the side of a flat bed without using bedrails?: A Little Help needed moving to and from a bed to a chair (including a wheelchair)?: A Lot Help needed standing up from a chair using your arms (e.g., wheelchair or bedside chair)?: A Little Help needed  to walk in hospital room?: Total Help needed climbing 3-5 steps with a railing? : Total 6 Click Score: 13    End of Session Equipment Utilized During Treatment: Gait belt Activity Tolerance: Patient tolerated treatment well Patient left: in chair;with call bell/phone within reach;with nursing/sitter in room;with chair alarm set Nurse Communication: Mobility status PT Visit Diagnosis: Other abnormalities of gait and mobility (R26.89);Difficulty in walking, not elsewhere classified (R26.2);Muscle weakness (generalized) (M62.81)    Time: 4010-2725 PT Time Calculation (min) (ACUTE ONLY): 19 min   Charges:   PT Evaluation $PT Eval Moderate Complexity: 1 Mod          [place], PT Acute Rehabilitation Services Office: [phone]   Sandy Salaam Izaha Shughart 01/06/2022, 10:12 AM

## 2022-01-06 NOTE — Progress Notes (Signed)
PROGRESS NOTE   Ellen Chandler  JME:[id number] DOB: [birth year] DOA: 01/03/2022 PCP: Deland Pretty, MD  Brief Narrative:  34 Y female independent living facility resident HFpEF no CAD HTN with complications of hypotension in the past HLD hypothyroid reflux DM TY 2 Known ethanol habituation in the past   Was talking on daughter over the phone and patient became short of breath associated with chest pain placed on NIPPV  BNP noted to be elevated, troponin 123 CXR suggestive of PNA but on my over read not convinced there does seem to be some increased interstitial opacities on the right side BUNs/creatinine 29/1.03--> 27/0.89   COVID is negative--EKG showed PVCs with ST inversions Patient subsequently flipped into A-fib RVR  Cardiology consulted see below  Hospital-Problem based course  Somnolence, metabolic encephalopathy on 9/1 Now resolved-likely secondary to poor sleep the night prior-no further work-up Stable from my perspective for any procedure if needed  NSTEMI, troponin elevation, T wave changes-EF 40-45% with decreased function but no wall motion abnormality poor CABG candidate given age and comorbidities-defer cath timing to cardiology Cannot give nitroglycerin/beta-blocker secondary to hypotension [pressures seem a little better today however], continue ASA 81, heparin GTT on board still Defer rest to cards  New onset A-fib/flutter RVR CHADVASC >3 has bled score less than 2 Hypotensive when placed on Cardizem drip 8/31-encephalopathic on Amio (this is not a known side effect)-Currently not on any rate controlling agent-deferred trial of oral Amio or other agent to cardiology Follow TSH as outpatient  Acute decompensated HFrEF EF 40 to 50% Required transient BiPAP in emergency room Diuresis as per cardiology Lasix 40 twice daily, -1.8 L so far  Impaired glucose tolerance A1c is probably less than 6.2 based on bedside CBG readings-cancel A1c  Prior ethanol  habituation- No real signs or symptoms of DTs at this time-okay to discontinue  DVT prophylaxis: Heparin GTT Code Status: Full Family Communication: No family currently at the bedside-- called Brad at [phone] to update Call Jana Half instead in future [phone] Disposition:  Status is: Inpatient Remains inpatient appropriate because:   Awaiting cardiac cath and will need therapy eval postprocedure as is at independent living   Consultants:  Cardiology  Procedures:   Antimicrobials: None   Subjective: More awake-eating some cookies-asked to be laid flat No overt chest pain    Objective: Vitals:   01/06/22 0027 01/06/22 0400 01/06/22 0748 01/06/22 1117  BP: 109/66 108/69 136/87 126/63  Pulse: 90 78 100 94  Resp: '16 20 17 20  '$ Temp: 97.8 F (36.6 C) 98.3 F (36.8 C) 97.7 F (36.5 C) 97.8 F (36.6 C)  TempSrc: Oral Oral Oral Oral  SpO2: 98% 97% 99% 94%  Weight:  61 kg    Height:        Intake/Output Summary (Last 24 hours) at 01/06/2022 1141 Last data filed at 01/05/2022 1949 Gross per 24 hour  Intake --  Output 700 ml  Net -700 ml    Filed Weights   01/04/22 0019 01/04/22 2102 01/06/22 0400  Weight: 61 kg 61 kg 61 kg    Examination:  EOMI NCAT no focal deficit-answer some orienting questions but a little bit confused Chest relatively clear no wheeze rales rhonchi Abdomen soft no rebound no guarding S1-S2 probable murmur-A-fib on monitors ranging from 80-100, mild JVD Neuro intact moving 4 limbs equally no focal deficit Psych affect is flat Trace lower extremity edema   Data Reviewed: personally reviewed   CBC    Component Value Date/Time  WBC 10.2 01/06/2022 0159   RBC 3.95 01/06/2022 0159   HGB 13.3 01/06/2022 0159   HCT 37.0 01/06/2022 0159   PLT 308 01/06/2022 0159   MCV 93.7 01/06/2022 0159   MCH 33.7 01/06/2022 0159   MCHC 35.9 01/06/2022 0159   RDW 11.4 (L) 01/06/2022 0159   LYMPHSABS 1.5 06/17/2021 1117   MONOABS 0.6 06/17/2021  1117   EOSABS 0.1 06/17/2021 1117   BASOSABS 0.0 06/17/2021 1117      Latest Ref Rng & Units 01/06/2022    1:59 AM 01/05/2022    4:34 AM 01/04/2022    4:16 AM  CMP  Glucose 70 - 99 mg/dL 122  95  103   BUN 8 - 23 mg/dL '20  21  27   '$ Creatinine 0.44 - 1.00 mg/dL 0.85  1.03  0.89   Sodium 135 - 145 mmol/L 130  136  136   Potassium 3.5 - 5.1 mmol/L 3.6  4.0  4.4   Chloride 98 - 111 mmol/L 91  99  102   CO2 22 - 32 mmol/L '28  28  23   '$ Calcium 8.9 - 10.3 mg/dL 9.6  9.4  8.9   Total Protein 6.5 - 8.1 g/dL 6.4  5.7  5.3   Total Bilirubin 0.3 - 1.2 mg/dL 0.7  0.8  0.7   Alkaline Phos 38 - 126 U/L 35  30  29   AST 15 - 41 U/L '21  19  23   '$ ALT 0 - 44 U/L '22  22  26      '$ Radiology Studies: CARDIAC CATHETERIZATION  Result Date: 01/05/2022 Cardiac cath cancelled due to lethargy and change in level of consciousness.   ECHOCARDIOGRAM COMPLETE  Result Date: 01/04/2022    ECHOCARDIOGRAM REPORT   Patient Name:   Ellen Chandler Date of Exam: 01/04/2022 Medical Rec #:  [id number]         Height:       64.0 in Accession #:    [id number]        Weight:       134.5 lb Date of Birth:  [birth year]         BSA:          1.653 m Patient Age:    88 years          BP:           129/82 mmHg Patient Gender: F                 HR:           108 bpm. Exam Location:  Inpatient Procedure: 2D Echo, Cardiac Doppler and Color Doppler Indications:    CHF-Acute Diastolic A83.41  History:        Patient has prior history of Echocardiogram examinations, most                 recent 05/05/2013. CHF, Angina; Risk Factors:Hypertension.  Sonographer:    Ronny Flurry Referring Phys: [place]  1. Left ventricular ejection fraction, by estimation, is 40 to 45%. The left ventricle has mildly decreased function. The left ventricle has no regional wall motion abnormalities. Left ventricular diastolic parameters are consistent with Grade I diastolic dysfunction (impaired relaxation). Elevated left ventricular  end-diastolic pressure.  2. Right ventricular systolic function is normal. The right ventricular size is normal. There is normal pulmonary artery systolic pressure.  3. Left atrial size was moderately dilated.  4. The mitral valve  is normal in structure. Trivial mitral valve regurgitation. No evidence of mitral stenosis.  5. The aortic valve is tricuspid. There is mild calcification of the aortic valve. There is mild thickening of the aortic valve. Aortic valve regurgitation is trivial. No aortic stenosis is present.  6. The inferior vena cava is normal in size with greater than 50% respiratory variability, suggesting right atrial pressure of 3 mmHg. FINDINGS  Left Ventricle: Left ventricular ejection fraction, by estimation, is 40 to 45%. The left ventricle has mildly decreased function. The left ventricle has no regional wall motion abnormalities. The left ventricular internal cavity size was normal in size. There is no left ventricular hypertrophy. Left ventricular diastolic parameters are consistent with Grade I diastolic dysfunction (impaired relaxation). Elevated left ventricular end-diastolic pressure.  LV Wall Scoring: The entire septum, apical anterior segment, apical inferior segment, and apex are hypokinetic. The anterior wall, entire lateral wall, and inferior wall are normal. Right Ventricle: The right ventricular size is normal. No increase in right ventricular wall thickness. Right ventricular systolic function is normal. There is normal pulmonary artery systolic pressure. The tricuspid regurgitant velocity is 1.57 m/s, and  with an assumed right atrial pressure of 3 mmHg, the estimated right ventricular systolic pressure is 16.1 mmHg. Left Atrium: Left atrial size was moderately dilated. Right Atrium: Right atrial size was normal in size. Pericardium: There is no evidence of pericardial effusion. Mitral Valve: The mitral valve is normal in structure. Trivial mitral valve regurgitation. No evidence of  mitral valve stenosis. Tricuspid Valve: The tricuspid valve is normal in structure. Tricuspid valve regurgitation is trivial. No evidence of tricuspid stenosis. Aortic Valve: The aortic valve is tricuspid. There is mild calcification of the aortic valve. There is mild thickening of the aortic valve. Aortic valve regurgitation is trivial. No aortic stenosis is present. Pulmonic Valve: The pulmonic valve was normal in structure. Pulmonic valve regurgitation is not visualized. No evidence of pulmonic stenosis. Aorta: The aortic root is normal in size and structure. Venous: The inferior vena cava is normal in size with greater than 50% respiratory variability, suggesting right atrial pressure of 3 mmHg. IAS/Shunts: No atrial level shunt detected by color flow Doppler.  LEFT VENTRICLE PLAX 2D LVIDd:         4.80 cm   Diastology LVIDs:         3.80 cm   LV e' medial:   7.46 cm/s LV PW:         0.90 cm   LV E/e' medial: 17.0 LV IVS:        0.70 cm LVOT diam:     1.80 cm LV SV:         62 LV SV Index:   38 LVOT Area:     2.54 cm  RIGHT VENTRICLE RV S prime:     11.90 cm/s TAPSE (M-mode): 2.4 cm LEFT ATRIUM             Index        RIGHT ATRIUM           Index LA diam:        4.00 cm 2.42 cm/m   RA Area:     14.20 cm LA Vol (A2C):   61.2 ml 37.03 ml/m  RA Volume:   33.20 ml  20.09 ml/m LA Vol (A4C):   55.3 ml 33.46 ml/m LA Biplane Vol: 59.9 ml 36.24 ml/m  AORTIC VALVE LVOT Vmax:   107.00 cm/s LVOT Vmean:  73.600 cm/s LVOT  VTI:    0.245 m  AORTA Ao Root diam: 3.30 cm Ao Asc diam:  3.40 cm MITRAL VALVE                TRICUSPID VALVE MV Area (PHT): 4.24 cm     TR Peak grad:   9.9 mmHg MV Decel Time: 179 msec     TR Vmax:        157.00 cm/s MV E velocity: 127.00 cm/s MV A velocity: 132.00 cm/s  SHUNTS MV E/A ratio:  0.96         Systemic VTI:  0.24 m                             Systemic Diam: 1.80 cm Skeet Latch MD Electronically signed by Skeet Latch MD Signature Date/Time: 01/04/2022/5:53:23 PM    Final       Scheduled Meds:  aspirin EC  81 mg Oral Daily   atorvastatin  20 mg Oral Daily   furosemide  40 mg Intravenous BID   heparin  3,000 Units Intravenous Once   sodium chloride flush  3 mL Intravenous Q12H   Continuous Infusions:  heparin 900 Units/hr (01/06/22 0145)     LOS: 2 days   Time spent: [place]le, MD [facility] To contact the attending provider between 7A-7P or the covering provider during after hours 7P-7A, please log into the web site [email or web address] and access using universal [place] password for that web site. If you do not have the password, please call the hospital operator.  01/06/2022, 11:41 AM

## 2022-01-07 DIAGNOSIS — I214 Non-ST elevation (NSTEMI) myocardial infarction: Secondary | ICD-10-CM | POA: Diagnosis not present

## 2022-01-07 LAB — CBC
HCT: 33.8 % — ABNORMAL LOW (ref 36.0–46.0)
Hemoglobin: 12.5 g/dL (ref 12.0–15.0)
MCH: 34.3 pg — ABNORMAL HIGH (ref 26.0–34.0)
MCHC: 37 g/dL — ABNORMAL HIGH (ref 30.0–36.0)
MCV: 92.9 fL (ref 80.0–100.0)
Platelets: 276 10*3/uL (ref 150–400)
RBC: 3.64 MIL/uL — ABNORMAL LOW (ref 3.87–5.11)
RDW: 11.2 % — ABNORMAL LOW (ref 11.5–15.5)
WBC: 9.8 10*3/uL (ref 4.0–10.5)
nRBC: 0 % (ref 0.0–0.2)

## 2022-01-07 LAB — COMPREHENSIVE METABOLIC PANEL
ALT: 18 U/L (ref 0–44)
AST: 24 U/L (ref 15–41)
Albumin: 3.2 g/dL — ABNORMAL LOW (ref 3.5–5.0)
Alkaline Phosphatase: 31 U/L — ABNORMAL LOW (ref 38–126)
Anion gap: 11 (ref 5–15)
BUN: 21 mg/dL (ref 8–23)
CO2: 27 mmol/L (ref 22–32)
Calcium: 8.7 mg/dL — ABNORMAL LOW (ref 8.9–10.3)
Chloride: 89 mmol/L — ABNORMAL LOW (ref 98–111)
Creatinine, Ser: 0.88 mg/dL (ref 0.44–1.00)
GFR, Estimated: >60 mL/min (ref >60)
Glucose, Bld: 87 mg/dL (ref 70–99)
Potassium: 3.3 mmol/L — ABNORMAL LOW (ref 3.5–5.1)
Sodium: 127 mmol/L — ABNORMAL LOW (ref 135–145)
Total Bilirubin: 0.7 mg/dL (ref 0.3–1.2)
Total Protein: 5.7 g/dL — ABNORMAL LOW (ref 6.5–8.1)

## 2022-01-07 LAB — HEPARIN LEVEL (UNFRACTIONATED)
Heparin Unfractionated: 0.26 IU/mL — ABNORMAL LOW (ref 0.30–0.70)
Heparin Unfractionated: 0.38 IU/mL (ref 0.30–0.70)

## 2022-01-07 LAB — MAGNESIUM: Magnesium: 1.8 mg/dL (ref 1.7–2.4)

## 2022-01-07 MED ORDER — METOPROLOL TARTRATE 12.5 MG HALF TABLET
12.5000 mg | ORAL_TABLET | Freq: Two times a day (BID) | ORAL | Status: DC
  Administered 2022-01-07 – 2022-01-08 (×2): 12.5 mg via ORAL
  Filled 2022-01-07 (×2): qty 1

## 2022-01-07 MED ORDER — POTASSIUM CHLORIDE CRYS ER 20 MEQ PO TBCR: 40.0000 meq | EXTENDED_RELEASE_TABLET | Freq: Two times a day (BID) | ORAL | Status: DC

## 2022-01-07 MED ORDER — POTASSIUM CHLORIDE CRYS ER 20 MEQ PO TBCR
40.0000 meq | EXTENDED_RELEASE_TABLET | Freq: Once | ORAL | Status: AC
Start: 2022-01-07 — End: 2022-01-07
  Administered 2022-01-07: 40 meq via ORAL
  Filled 2022-01-07: qty 2

## 2022-01-07 NOTE — Progress Notes (Signed)
   01/07/22 0800  Assess: MEWS Score  Temp 98 F (36.7 C)  BP 108/79  MAP (mmHg) 86  Pulse Rate (!) 113  Resp 16  Level of Consciousness Alert  O2 Device Room Air  Assess: MEWS Score  MEWS Temp 0  MEWS Systolic 0  MEWS Pulse 2  MEWS RR 0  MEWS LOC 0  MEWS Score 2  MEWS Score Color Yellow  Assess: if the MEWS score is Yellow or Red  Were vital signs taken at a resting state? Yes  Focused Assessment No change from prior assessment  Does the patient meet 2 or more of the SIRS criteria? No  MEWS guidelines implemented *See Row Information* No, previously yellow, continue vital signs every 4 hours  Treat  Pain Scale Faces  Pain Score 0  Take Vital Signs  Increase Vital Sign Frequency  Yellow: Q 2hr X 2 then Q 4hr X 2, if remains yellow, continue Q 4hrs  Escalate  MEWS: Escalate Yellow: discuss with charge nurse/RN and consider discussing with provider and RRT  Notify: Charge Nurse/RN  Name of Charge Nurse/RN Notified Aaron Edelman  Date Charge Nurse/RN Notified 01/07/22  Time Charge Nurse/RN Notified 0820  Assess: SIRS CRITERIA  SIRS Temperature  0  SIRS Pulse 1  SIRS Respirations  0  SIRS WBC 1  SIRS Score Sum  2

## 2022-01-07 NOTE — Progress Notes (Signed)
Progress Note  Patient Name: Ellen Chandler Date of Encounter: 01/07/2022  Primary Cardiologist:   Mertie Moores, MD   Subjective   She says she feels stronger today.  She has been up in the chair and is tired.  Denies chest pain or acute SOB.   Inpatient Medications    Scheduled Meds:  aspirin EC  81 mg Oral Daily   atorvastatin  20 mg Oral Daily   furosemide  40 mg Intravenous BID   polyethylene glycol  17 g Oral Daily   sodium chloride flush  3 mL Intravenous Q12H   Continuous Infusions:  heparin 1,000 Units/hr (01/07/22 0526)   PRN Meds: acetaminophen **OR** acetaminophen, alum & mag hydroxide-simeth, alum & mag hydroxide-simeth, levalbuterol, ondansetron **OR** ondansetron (ZOFRAN) IV   Vital Signs    Vitals:   01/07/22 0120 01/07/22 0405 01/07/22 0417 01/07/22 0800  BP: 102/66 111/79  108/79  Pulse: 85 (!) 114  (!) 113  Resp: '20 16  16  '$ Temp:  97.8 F (36.6 C)  98 F (36.7 C)  TempSrc:  Oral  Oral  SpO2: 94% 91%    Weight:   64.2 kg   Height:        Intake/Output Summary (Last 24 hours) at 01/07/2022 1117 Last data filed at 01/07/2022 0800 Gross per 24 hour  Intake 652.11 ml  Output 1550 ml  Net -897.89 ml   Filed Weights   01/04/22 2102 01/06/22 0400 01/07/22 0417  Weight: 61 kg 61 kg 64.2 kg    Telemetry    Atrial fib with rate 100 - 110 - Personally Reviewed  ECG    NA - Personally Reviewed  Physical Exam   GEN: No  acute distress.   Neck: No  JVD Cardiac: Irregular RR, no murmurs, rubs, or gallops.  Respiratory: Clear   to auscultation bilaterally. GI: Soft, nontender, non-distended, normal bowel sounds  MS:  No edema; No deformity. Neuro:   Nonfocal  Psych: Oriented and appropriate    Labs    Chemistry Recent Labs  Lab 01/05/22 0434 01/06/22 0159 01/07/22 0140  NA 136 130* 127*  K 4.0 3.6 3.3*  CL 99 91* 89*  CO2 '28 28 27  '$ GLUCOSE 95 122* 87  BUN '21 20 21  '$ CREATININE 1.03* 0.85 0.88  CALCIUM 9.4 9.6 8.7*  PROT  5.7* 6.4* 5.7*  ALBUMIN 3.1* 3.6 3.2*  AST '19 21 24  '$ ALT '22 22 18  '$ ALKPHOS 30* 35* 31*  BILITOT 0.8 0.7 0.7  GFRNONAA 52* >60 >60  ANIONGAP '9 11 11     '$ Hematology Recent Labs  Lab 01/05/22 0434 01/06/22 0159 01/07/22 0140  WBC 7.0 10.2 9.8  RBC 3.62* 3.95 3.64*  HGB 12.1 13.3 12.5  HCT 35.2* 37.0 33.8*  MCV 97.2 93.7 92.9  MCH 33.4 33.7 34.3*  MCHC 34.4 35.9 37.0*  RDW 11.9 11.4* 11.2*  PLT 269 308 276    Cardiac EnzymesNo results for input(s): "TROPONINI" in the last 168 hours. No results for input(s): "TROPIPOC" in the last 168 hours.   BNP Recent Labs  Lab 01/04/22 0006  BNP 519.6*     DDimer No results for input(s): "DDIMER" in the last 168 hours.   Radiology    CARDIAC CATHETERIZATION  Result Date: 01/05/2022 Cardiac cath cancelled due to lethargy and change in level of consciousness.    Cardiac Studies   Echo as above.     Patient Profile     88 y.o. female  hypertension, HFpEF, GERD, hypothyroidism who is being seen 01/04/2022 for the evaluation of CHF at the request of Dr. Verlon Au.    Assessment & Plan    Acute hypoxic respiratory failure:  Off O2 and oxygenating OK.     Net negative is 3 liters.   Free water restricted but Na continues to fall.   Continue IV diuresis again today.     Atrial fib with RVR:      I am going to try low dose beta blocker with hold parameters.     Elevated troponin:  Cath was held secondary to lethargy and weakness.   She would like to proceed with diagnostic cath as suggested by Dr. Acie Fredrickson.  We can tentatively put this back on the board for Tuesday.    HTN:  This is being managed in the context of treating his CHF    Hypokalemia:  Will supplement.     For questions or updates, please contact [place] Please consult [email or web address] for contact info under Cardiology/STEMI.   Signed, Minus Breeding, MD  01/07/2022, 11:17 AM

## 2022-01-07 NOTE — Progress Notes (Signed)
ANTICOAGULATION CONSULT NOTE   Pharmacy Consult for heparin  Indication: chest pain/ACS and atrial fibrillation  No Known Allergies  Patient Measurements: Height: '5\' 4"'$  (162.6 cm) Weight: 64.2 kg (141 lb 8.6 oz) IBW/kg (Calculated) : 54.7 Heparin Dosing Weight: 61 kg  Vital Signs: Temp: 98 F (36.7 C) (09/03 0800) Temp Source: Oral (09/03 0800) BP: 108/79 (09/03 0800) Pulse Rate: 113 (09/03 0800)  Labs: Recent Labs     0000 01/04/22 1145 01/05/22 0434 01/05/22 1227 01/06/22 0159 01/07/22 0140 01/07/22 1057  HGB   < >  --  12.1  --  13.3 12.5  --   HCT  --   --  35.2*  --  37.0 33.8*  --   PLT  --   --  269  --  308 276  --   HEPARINUNFRC  --   --  0.28*   < > 0.32 0.26* 0.38  CREATININE  --   --  1.03*  --  0.85 0.88  --   TROPONINIHS  --  102*  --   --   --   --   --    < > = values in this interval not displayed.     Estimated Creatinine Clearance: 38.2 mL/min (by C-G formula based on SCr of 0.88 mg/dL).   Medical History: Past Medical History:  Diagnosis Date   Abnormal ECG    a. Pt aware of long hx of abnl ecg->h/o normal stress testing in [place] ~ 2000.   Diastolic dysfunction    a. 04/2013 Echo: EF 60-65%, No rwma, Gr 1 DD, mild MR.   GERD (gastroesophageal reflux disease)    Hypertension    Hypothyroidism     Medications:  Scheduled:   aspirin EC  81 mg Oral Daily   atorvastatin  20 mg Oral Daily   furosemide  40 mg Intravenous BID   metoprolol tartrate  12.5 mg Oral BID   polyethylene glycol  17 g Oral Daily   potassium chloride  40 mEq Oral Once   sodium chloride flush  3 mL Intravenous Q12H    Assessment: 88 yo F with PMH HFpEF presents with trouble breathing. Pt does not take anticoagulants at home according to completed med history or recent fill history. Plan for diagnostic cath on 9/5. Pharmacy is consulted to dose heparin with indication of afib + ACS.   Heparin level today is therapeutic at 0.38, on 1000 units/hr. Hgb 12.5,  plt 276. No line issues or signs/symptoms of bleeding reported.  Goal of Therapy:  Heparin level 0.3-0.7 units/ml Monitor platelets by anticoagulation protocol: Yes   Plan:  Continue heparin gtt at 1000 units/hr. Check 8hr heparin level and, if therapeutic, may switch to daily monitoring of heparin level.  Monitor CBC, Heparin level, s/sx bleeding daily.   Billey Gosling, PharmD PGY1 Pharmacy Resident 9/3/202311:32 AM

## 2022-01-07 NOTE — Progress Notes (Signed)
   01/07/22 1000  Assess: MEWS Score  BP 106/75  Level of Consciousness Alert  Assess: MEWS Score  MEWS Temp 0  MEWS Systolic 0  MEWS Pulse 2  MEWS RR 0  MEWS LOC 0  MEWS Score 2  MEWS Score Color Yellow  Assess: if the MEWS score is Yellow or Red  Were vital signs taken at a resting state? Yes  Focused Assessment No change from prior assessment  Does the patient meet 2 or more of the SIRS criteria? No  MEWS guidelines implemented *See Row Information* No, previously yellow, continue vital signs every 4 hours  Treat  Pain Scale 0-10  Pain Score 0  Take Vital Signs  Increase Vital Sign Frequency  Yellow: Q 2hr X 2 then Q 4hr X 2, if remains yellow, continue Q 4hrs  Escalate  MEWS: Escalate Yellow: discuss with charge nurse/RN and consider discussing with provider and RRT  Notify: Charge Nurse/RN  Name of Charge Nurse/RN Notified Aaron Edelman  Date Charge Nurse/RN Notified 01/07/22  Time Charge Nurse/RN Notified 1000

## 2022-01-07 NOTE — Progress Notes (Signed)
Patient's IV was beeping, when RN entered the room patient's heparin was running at 20 ml/hr however order is for 10 ml/hr, RN adjusted the heparin back to 10 ml/hr at 23:05  RN then verified pump rate history in the [facility], history shows that the rate for the heparin was changed to 120 ml/hr at 22:23.    RN notified pharmacy and was advised by the pharmacist to stop the heparin.  RN stopped the heparin at 23:10.  RN assessed patient there is no signs of bleeding or new bruising.  Pharmacist advised RN that provider on call has been paged.  RN will continue to monitor patient.

## 2022-01-07 NOTE — Progress Notes (Signed)
   01/07/22 1431  Assess: MEWS Score  Temp 98.1 F (36.7 C)  BP 108/71  MAP (mmHg) 83  ECG Heart Rate (!) 114  Resp 16  Level of Consciousness Alert  SpO2 96 %  O2 Device Room Air  Assess: MEWS Score  MEWS Temp 0  MEWS Systolic 0  MEWS Pulse 2  MEWS RR 0  MEWS LOC 0  MEWS Score 2  MEWS Score Color Yellow  Assess: if the MEWS score is Yellow or Red  Were vital signs taken at a resting state? Yes  Focused Assessment No change from prior assessment  Does the patient meet 2 or more of the SIRS criteria? No  MEWS guidelines implemented *See Row Information* No, previously yellow, continue vital signs every 4 hours  Take Vital Signs  Increase Vital Sign Frequency  Yellow: Q 2hr X 2 then Q 4hr X 2, if remains yellow, continue Q 4hrs  Escalate  MEWS: Escalate Yellow: discuss with charge nurse/RN and consider discussing with provider and RRT  Notify: Charge Nurse/RN  Name of Charge Nurse/RN Notified Aaron Edelman  Date Charge Nurse/RN Notified 01/07/22  Time Charge Nurse/RN Notified 1432  Assess: SIRS CRITERIA  SIRS Temperature  0  SIRS Pulse 1  SIRS Respirations  0  SIRS WBC 1  SIRS Score Sum  2

## 2022-01-07 NOTE — Progress Notes (Signed)
ANTICOAGULATION CONSULT NOTE   Pharmacy Consult for heparin  Indication: chest pain/ACS and atrial fibrillation  No Known Allergies  Patient Measurements: Height: '5\' 4"'$  (162.6 cm) Weight: 61 kg (134 lb 7.7 oz) IBW/kg (Calculated) : 54.7 Heparin Dosing Weight: 61 kg  Vital Signs: Temp: 97.7 F (36.5 C) (09/02 2331) Temp Source: Oral (09/02 2331) BP: 102/66 (09/03 0120) Pulse Rate: 85 (09/03 0120)  Labs: Recent Labs    01/04/22 0416 01/04/22 0416 01/04/22 0932 01/04/22 1145 01/05/22 0434 01/05/22 1227 01/06/22 0159 01/07/22 0140  HGB 11.6*  --   --   --  12.1  --  13.3 12.5  HCT 35.1*  --   --   --  35.2*  --  37.0 33.8*  PLT 266  --   --   --  269  --  308 276  HEPARINUNFRC  --    < >  --   --  0.28* 0.34 0.32 0.26*  CREATININE 0.89  --   --   --  1.03*  --  0.85  --   TROPONINIHS 123*  --  128* 102*  --   --   --   --    < > = values in this interval not displayed.     Estimated Creatinine Clearance: 39.5 mL/min (by C-G formula based on SCr of 0.85 mg/dL).   Medical History: Past Medical History:  Diagnosis Date   Abnormal ECG    a. Pt aware of long hx of abnl ecg->h/o normal stress testing in [place] ~ 2000.   Diastolic dysfunction    a. 04/2013 Echo: EF 60-65%, No rwma, Gr 1 DD, mild MR.   GERD (gastroesophageal reflux disease)    Hypertension    Hypothyroidism     Medications:  Scheduled:   aspirin EC  81 mg Oral Daily   atorvastatin  20 mg Oral Daily   furosemide  40 mg Intravenous BID   polyethylene glycol  17 g Oral Daily   sodium chloride flush  3 mL Intravenous Q12H    Assessment: 88 yo F with PMH HFpEF presents with trouble breathing. Pt does not take anticoagulants at home according to completed med history or recent fill history. Pharmacy is consulted to dose heparin with indication of afib + ACS  Heparin level below goal: 0.26 on 900 units/hr. CBC stable. No s/sx of bleeding or infusion issues per nursing.   Goal of Therapy:   Heparin level 0.3-0.7 units/ml Monitor platelets by anticoagulation protocol: Yes   Plan:  Increase heparin gtt to 1000 units/hr  Monitor CBC, Heparin level, s/sx bleeding and transition to oral therapies as appropriate.   Georga Bora, PharmD Clinical Pharmacist 01/07/2022 2:49 AM Please check AMION for all [place] numbers

## 2022-01-07 NOTE — TOC Initial Note (Signed)
Transition of Care [facility]) - Initial/Assessment Note    Patient Details  Name: Ellen Chandler MRN: [id number] Date of Birth: May 16, 1930  Transition of Care [facility]) CM/SW Contact:    [name], LCSW Phone Number: 01/07/2022, 4:14 PM  Clinical Narrative:                  CSW received SNF consult. CSW met with pt at bedside. CSW introduced self and explained role at the hospital. Pt reports that PTA she lived in an independent living facility. Pt reports she uses a walker but is independent with mobility and ADL's.  CSW reviewed PT/OT recommendations for SNF. Pt reports she fine with SNF but would like to discuss with her son.Pt gave CSW permission to fax out to facilities in the area. Pt has no preference of facility at this time. CSW gave pt medicare.gov rating list to review. CSW explained insurance auth process.  CSW will continue to follow.   Expected Discharge Plan: Skilled Nursing Facility Barriers to Discharge: Continued Medical Work up, SNF Pending bed offer   Patient Goals and CMS Choice Patient states their goals for this hospitalization and ongoing recovery are:: to go rehab CMS Medicare.gov Compare Post Acute Care list provided to:: Patient Choice offered to / list presented to : Patient  Expected Discharge Plan and Services Expected Discharge Plan: [place] arrangements for the past 2 months: [place]                                      Prior Living Arrangements/Services Living arrangements for the past 2 months: [place] Lives with:: Self Patient language and need for interpreter reviewed:: Yes Do you feel safe going back to the place where you live?: Yes      Need for Family Participation in Patient Care: Yes (Comment) Care giver support system in place?: Yes (comment)   Criminal Activity/Legal Involvement Pertinent to Current Situation/Hospitalization: No - Comment as  needed  Activities of Daily Living      Permission Sought/Granted Permission sought to share information with : Facility Art therapist granted to share information with : Yes, Verbal Permission Granted     Permission granted to share info w AGENCY: SNF        Emotional Assessment Appearance:: Appears stated age Attitude/Demeanor/Rapport: Engaged Affect (typically observed): Appropriate Orientation: : Oriented to Self, Oriented to Place, Oriented to  Time, Oriented to Situation Alcohol / Substance Use: Not Applicable Psych Involvement: No (comment)  Admission diagnosis:  Acute respiratory failure with hypoxia ([place]) [J96.01] Community acquired pneumonia of left lower lobe of lung [J18.9] Patient Active Problem List   Diagnosis Date Noted   Non-ST elevation (NSTEMI) myocardial infarction ([place]) 01/05/2022   History of ETOH abuse 01/04/2022   Chronic diastolic CHF (congestive heart failure) ([place]) 01/04/2022   Acute respiratory failure with hypoxia ([place]) 01/04/2022   Chest pain 06/17/2016   UTI (urinary tract infection) 06/17/2016   Nonspecific abnormal electrocardiogram (ECG) (EKG) 05/06/2013   Acute bronchitis 05/06/2013   Essential hypertension 55/97/4163   Acute diastolic CHF (congestive heart failure) ([place]) 05/06/2013   Dehydration 05/05/2013   CAP (community acquired pneumonia) 05/05/2013   Orthostatic hypotension 05/05/2013   Hyponatremia 05/05/2013   PCP:  Deland Pretty, MD Pharmacy:   [id number] [place] [address] [place]. [place] [address] Phone: [phone] Fax: [phone]  [place] [id number] - [address] [place] - [place] [address] Phone: [phone] Fax: [phone]     Social Determinants of Health (SDOH) Interventions    Readmission Risk Interventions     No data to display           [name], [place] Social Worker

## 2022-01-07 NOTE — NC FL2 (Signed)
Ellen Chandler LEVEL OF CARE SCREENING TOOL     IDENTIFICATION  Patient Name: Ellen Chandler Birthdate: 20-Mar-1931 Sex: female Admission Date (Current Location): 01/03/2022  [facility] and [place] Number:  Herbalist and Address:  [facility] [place], [place] [address], [place], [place] [address]      Provider Number: [id number]  Attending Physician Name and Address:  Nita Sells, MD  Relative Name and Phone Number:       Current Level of Care: Hospital Recommended Level of Care: [place] Prior Approval Number:    Date Approved/Denied:   PASRR Number: pending  Discharge Plan: SNF    Current Diagnoses: Patient Active Problem List   Diagnosis Date Noted   Non-ST elevation (NSTEMI) myocardial infarction ([place]) 01/05/2022   History of ETOH abuse 01/04/2022   Chronic diastolic CHF (congestive heart failure) ([place]) 01/04/2022   Acute respiratory failure with hypoxia ([place]) 01/04/2022   Chest pain 06/17/2016   UTI (urinary tract infection) 06/17/2016   Nonspecific abnormal electrocardiogram (ECG) (EKG) 05/06/2013   Acute bronchitis 05/06/2013   Essential hypertension 66/10/3014   Acute diastolic CHF (congestive heart failure) ([place]) 05/06/2013   Dehydration 05/05/2013   CAP (community acquired pneumonia) 05/05/2013   Orthostatic hypotension 05/05/2013   Hyponatremia 05/05/2013    Orientation RESPIRATION BLADDER Height & Weight     Self, Time, Situation, Place  Normal Continent, External catheter Weight: 141 lb 8.6 oz (64.2 kg) Height:  '5\' 4"'$  (162.6 cm)  BEHAVIORAL SYMPTOMS/MOOD NEUROLOGICAL BOWEL NUTRITION STATUS      Continent Diet  AMBULATORY STATUS COMMUNICATION OF NEEDS Skin   Extensive Assist Verbally Normal                       Personal Care Assistance Level of Assistance  Bathing, Feeding, Dressing Bathing Assistance: Limited assistance Feeding assistance: Independent Dressing Assistance: Limited  assistance     Functional Limitations Info  Sight, Hearing, Speech Sight Info: Adequate Hearing Info: Adequate Speech Info: Adequate    SPECIAL CARE FACTORS FREQUENCY  PT (By licensed PT), OT (By licensed OT)     PT Frequency: 5x a week OT Frequency: 5x a week            Contractures Contractures Info: Not present    Additional Factors Info  Code Status, Allergies Code Status Info: Full Allergies Info: NKA           Current Medications (01/07/2022):  This is the current hospital active medication list Current Facility-Administered Medications  Medication Dose Route Frequency Provider Last Rate Last Admin   acetaminophen (TYLENOL) tablet 650 mg  650 mg Oral Q6H PRN Etta Quill, DO   650 mg at 01/07/22 1343   Or   acetaminophen (TYLENOL) suppository 650 mg  650 mg Rectal Q6H PRN Etta Quill, DO       alum & mag hydroxide-simeth (MAALOX/MYLANTA) 200-200-20 MG/5ML suspension 15 mL  15 mL Oral Q4H PRN Nita Sells, MD   15 mL at 01/04/22 1813   alum & mag hydroxide-simeth (MAALOX/MYLANTA) 200-200-20 MG/5ML suspension 15 mL  15 mL Oral Q6H PRN Nita Sells, MD       aspirin EC tablet 81 mg  81 mg Oral Daily Kaleen Mask, RPH   81 mg at 01/07/22 0928   atorvastatin (LIPITOR) tablet 20 mg  20 mg Oral Daily Bhagat, Bhavinkumar, PA   20 mg at 01/07/22 0928   furosemide (LASIX) injection 40 mg  40 mg Intravenous BID Nahser, Wonda Cheng, MD   40 mg at 01/07/22 0928   heparin ADULT infusion 100 units/mL (25000 units/21m)  1,000 Units/hr Intravenous Continuous RDawayne Cirri RPH 10 mL/hr at 01/07/22 0526 1,000 Units/hr at 01/07/22 0526   levalbuterol (XOPENEX) nebulizer solution 0.63 mg  0.63 mg Nebulization Q6H PRN SNita Sells MD       metoprolol tartrate (LOPRESSOR) tablet 12.5 mg  12.5 mg Oral BID HMinus Breeding MD   12.5 mg at 01/07/22 1340   ondansetron (ZOFRAN) tablet 4 mg  4 mg Oral Q6H PRN GEtta Quill DO       Or   ondansetron  ([facility] injection 4 mg  4 mg Intravenous Q6H PRN GEtta Quill DO   4 mg at 01/05/22 1118   polyethylene glycol (MIRALAX / GLYCOLAX) packet 17 g  17 g Oral Daily SNita Sells MD   17 g at 01/07/22 0930   sodium chloride flush (NS) 0.9 % injection 3 mL  3 mL Intravenous Q12H Bhagat, Bhavinkumar, PA   3 mL at 01/07/22 0930     Discharge Medications: Please see discharge summary for a list of discharge medications.  Relevant Imaging Results:  Relevant Lab Results:   Additional Information SSN [id number] [place]meterio Reeve [place]

## 2022-01-07 NOTE — Progress Notes (Signed)
   01/07/22 1100  Assess: MEWS Score  Pulse Rate 70  ECG Heart Rate (!) 111  Resp 17  Level of Consciousness Alert  SpO2 95 %  O2 Device Room Air  Assess: MEWS Score  MEWS Temp 0  MEWS Systolic 0  MEWS Pulse 2  MEWS RR 0  MEWS LOC 0  MEWS Score 2  MEWS Score Color Yellow  Assess: if the MEWS score is Yellow or Red  Were vital signs taken at a resting state? Yes  Focused Assessment No change from prior assessment  Does the patient meet 2 or more of the SIRS criteria? No  MEWS guidelines implemented *See Row Information* No, previously yellow, continue vital signs every 4 hours  Treat  Pain Scale 0-10  Pain Score 0  Take Vital Signs  Increase Vital Sign Frequency  Yellow: Q 2hr X 2 then Q 4hr X 2, if remains yellow, continue Q 4hrs  Escalate  MEWS: Escalate Yellow: discuss with charge nurse/RN and consider discussing with provider and RRT  Notify: Charge Nurse/RN  Name of Charge Nurse/RN Notified Aaron Edelman  Date Charge Nurse/RN Notified 01/07/22  Time Charge Nurse/RN Notified 1115  Assess: SIRS CRITERIA  SIRS Temperature  0  SIRS Pulse 1  SIRS Respirations  0  SIRS WBC 1  SIRS Score Sum  2

## 2022-01-07 NOTE — Progress Notes (Signed)
Mobility Specialist: Progress Note   01/07/22 1154  Mobility  Activity Transferred from chair to bed  Level of Assistance Moderate assist, patient does 50-74%  Assistive Device Front wheel walker  Distance Ambulated (ft) 2 ft  Activity Response Tolerated fair  $Mobility charge 1 Mobility   Pre-Mobility: 117 HR, 94% SpO2 During Mobility: 130s HR  Pt received in the chair and requesting to return to the bed. ModA to stand from the chair with verbal cues for hand placement. Attempted to ambulate before returning to bed but pt declining despite encouragement. Pt back in bed with call bell and phone at her side.   [facility] Evee Liska Mobility Specialist Mobility Specialist [address] [place]: [phone]

## 2022-01-07 NOTE — Progress Notes (Signed)
[place] for heparin  Indication: chest pain/ACS and atrial fibrillation  At ~2300 this evening the RN called to report the patient's heparin pump had been infusing at a higher rate than pharmacy ordered. Patient should have been on 1000 units/hr, however the RN found the pump infusing at 12000 units/hr. She believes the patient was at that rate for ~40 minutes which would be ~9000 units of heparin. The RN reports no overt s/sx of bleeding. I discussed this safety event with on call provider, Dr. Marlowe Sax, and she would like to pause infusion and monitor at this time. No acute reason to reverse heparin at this time.   Plan:  Hold heparin and recheck level @ 0400 Monitor for s/sx of bleeding   Ellen Chandler, PharmD Clinical Pharmacist 01/07/2022 11:45 PM Please check AMION for all [place] numbers

## 2022-01-07 NOTE — Progress Notes (Signed)
PROGRESS NOTE   Ellen Chandler  WUJ:[id number] DOB: 01-Oct-1930 DOA: 01/03/2022 PCP: Deland Pretty, MD  Brief Narrative:  4 Y female independent living facility resident HFpEF no CAD HTN with complications of hypotension in the past HLD hypothyroid reflux DM TY 2 Known ethanol habituation in the past   Was talking on daughter over the phone and patient became short of breath associated with chest pain placed on NIPPV  BNP noted to be elevated, troponin 123 CXR suggestive of PNA but on my over read not convinced there does seem to be some increased interstitial opacities on the right side BUNs/creatinine 29/1.03--> 27/0.89   COVID is negative--EKG showed PVCs with ST inversions Patient subsequently flipped into A-fib RVR  Cardiology consulted secondary to elevated troponin A-fib RVR and discussed cath see below  Hospital-Problem based course  Somnolence, metabolic encephalopathy on 9/1 Now resolved-secondary to poor sleep   NSTEMI, troponin elevation, T wave changes-EF 40-45% with decreased function but no wall motion abnormality poor CABG candidate given age and comorbidities-defer cath timing to cardiology Hypotension limiting GDMT, continue ASA 81, heparin GTT on board still Defer rest to cards  Acute hyponatremia likely secondary to tea toast potomania Sodium dropping to the 127 range from 138 probably drinking more than eating-fluid restrict to 1500 cc, continue aquapheresis with Lasix Consider urine studies, salt tabs or urea tabs in a.m. if worse   New onset A-fib/flutter RVR CHADVASC >3 has bled score less than 2 Hypotensive when placed on Cardizem drip 8/31-encephalopathic on Amio (this is not a known side effect)-on Toprol 12.5 twice daily with hold parameters per cardiology Follow TSH as outpatient  Acute decompensated HFrEF EF 40 to 50% Required transient BiPAP in emergency room Diuresis as per cardiology Lasix 40 twice daily, - 2.45 L so far, weights are  inaccurate  Impaired glucose tolerance A1c is probably less than 6.2 based on bedside CBG readings-cancel A1c  Prior ethanol habituation- No real signs or symptoms of DTs at this time-okay to discontinue  DVT prophylaxis: Heparin GTT Code Status: Full Family Communication: No family currently at the bedside-- called Brad at [phone] to update Call Jana Half instead in future [phone] Disposition:  Status is: Inpatient Remains inpatient appropriate because:   Awaiting cardiac cath and will need therapy eval postprocedure as is at independent living   Consultants:  Cardiology  Procedures:   Antimicrobials: None   Subjective:  Walked with therapy 2 feet-more alert today Sitting up in chair about to eat Denies chest pain cough cold  Objective: Vitals:   01/07/22 0417 01/07/22 0800 01/07/22 1000 01/07/22 1100  BP:  108/79 106/75   Pulse:  (!) 113  70  Resp:  16  17  Temp:  98 F (36.7 C)    TempSrc:  Oral    SpO2:    95%  Weight: 64.2 kg     Height:        Intake/Output Summary (Last 24 hours) at 01/07/2022 1404 Last data filed at 01/07/2022 0800 Gross per 24 hour  Intake 772.11 ml  Output 800 ml  Net -27.89 ml    Filed Weights   01/04/22 2102 01/06/22 0400 01/07/22 0417  Weight: 61 kg 61 kg 64.2 kg    Examination:  EOMI NCAT no focal deficit-completely oriented today Chest relatively clear no wheeze rales rhonchi Abdomen soft no rebound no guarding S1-S2 mild JVD probable murmur-A-fib on monitors ranging from 80-110,  Neuro intact moving 4 limbs equally no focal deficit Psych affect is flat  Trace lower extremity edema   Data Reviewed: personally reviewed   CBC    Component Value Date/Time   WBC 9.8 01/07/2022 0140   RBC 3.64 (L) 01/07/2022 0140   HGB 12.5 01/07/2022 0140   HCT 33.8 (L) 01/07/2022 0140   PLT 276 01/07/2022 0140   MCV 92.9 01/07/2022 0140   MCH 34.3 (H) 01/07/2022 0140   MCHC 37.0 (H) 01/07/2022 0140   RDW 11.2 (L)  01/07/2022 0140   LYMPHSABS 1.5 06/17/2021 1117   MONOABS 0.6 06/17/2021 1117   EOSABS 0.1 06/17/2021 1117   BASOSABS 0.0 06/17/2021 1117      Latest Ref Rng & Units 01/07/2022    1:40 AM 01/06/2022    1:59 AM 01/05/2022    4:34 AM  CMP  Glucose 70 - 99 mg/dL 87  122  95   BUN 8 - 23 mg/dL '21  20  21   '$ Creatinine 0.44 - 1.00 mg/dL 0.88  0.85  1.03   Sodium 135 - 145 mmol/L 127  130  136   Potassium 3.5 - 5.1 mmol/L 3.3  3.6  4.0   Chloride 98 - 111 mmol/L 89  91  99   CO2 22 - 32 mmol/L '27  28  28   '$ Calcium 8.9 - 10.3 mg/dL 8.7  9.6  9.4   Total Protein 6.5 - 8.1 g/dL 5.7  6.4  5.7   Total Bilirubin 0.3 - 1.2 mg/dL 0.7  0.7  0.8   Alkaline Phos 38 - 126 U/L 31  35  30   AST 15 - 41 U/L '24  21  19   '$ ALT 0 - 44 U/L '18  22  22      '$ Radiology Studies: CARDIAC CATHETERIZATION  Result Date: 01/05/2022 Cardiac cath cancelled due to lethargy and change in level of consciousness.     Scheduled Meds:  aspirin EC  81 mg Oral Daily   atorvastatin  20 mg Oral Daily   furosemide  40 mg Intravenous BID   metoprolol tartrate  12.5 mg Oral BID   polyethylene glycol  17 g Oral Daily   sodium chloride flush  3 mL Intravenous Q12H   Continuous Infusions:  heparin 1,000 Units/hr (01/07/22 0526)     LOS: 3 days   Time spent: 45  Nita Sells, MD [facility] To contact the attending provider between 7A-7P or the covering provider during after hours 7P-7A, please log into the web site [email or web address] and access using universal [place] password for that web site. If you do not have the password, please call the hospital operator.  01/07/2022, 2:04 PM

## 2022-01-07 NOTE — Plan of Care (Signed)
  Problem: Education: Goal: Knowledge of General Education information will improve Description: Including pain rating scale, medication(s)/side effects and non-pharmacologic comfort measures Outcome: Progressing   Problem: Clinical Measurements: Goal: Respiratory complications will improve Outcome: Progressing   Problem: Nutrition: Goal: Adequate nutrition will be maintained Outcome: Progressing   

## 2022-01-08 DIAGNOSIS — I5021 Acute systolic (congestive) heart failure: Secondary | ICD-10-CM

## 2022-01-08 DIAGNOSIS — J9601 Acute respiratory failure with hypoxia: Secondary | ICD-10-CM | POA: Diagnosis not present

## 2022-01-08 DIAGNOSIS — E871 Hypo-osmolality and hyponatremia: Secondary | ICD-10-CM

## 2022-01-08 DIAGNOSIS — I4892 Unspecified atrial flutter: Secondary | ICD-10-CM

## 2022-01-08 DIAGNOSIS — I214 Non-ST elevation (NSTEMI) myocardial infarction: Secondary | ICD-10-CM | POA: Diagnosis not present

## 2022-01-08 DIAGNOSIS — I10 Essential (primary) hypertension: Secondary | ICD-10-CM | POA: Diagnosis not present

## 2022-01-08 LAB — BASIC METABOLIC PANEL
Anion gap: 9 (ref 5–15)
BUN: 28 mg/dL — ABNORMAL HIGH (ref 8–23)
CO2: 28 mmol/L (ref 22–32)
Calcium: 8.4 mg/dL — ABNORMAL LOW (ref 8.9–10.3)
Chloride: 89 mmol/L — ABNORMAL LOW (ref 98–111)
Creatinine, Ser: 0.89 mg/dL (ref 0.44–1.00)
GFR, Estimated: >60 mL/min (ref >60)
Glucose, Bld: 96 mg/dL (ref 70–99)
Potassium: 3.3 mmol/L — ABNORMAL LOW (ref 3.5–5.1)
Sodium: 126 mmol/L — ABNORMAL LOW (ref 135–145)

## 2022-01-08 LAB — CBC
HCT: 33 % — ABNORMAL LOW (ref 36.0–46.0)
HCT: 32.6 % — ABNORMAL LOW (ref 36.0–46.0)
Hemoglobin: 11.9 g/dL — ABNORMAL LOW (ref 12.0–15.0)
Hemoglobin: 11.7 g/dL — ABNORMAL LOW (ref 12.0–15.0)
MCH: 33.8 pg (ref 26.0–34.0)
MCH: 33.3 pg (ref 26.0–34.0)
MCHC: 36.1 g/dL — ABNORMAL HIGH (ref 30.0–36.0)
MCHC: 35.9 g/dL (ref 30.0–36.0)
MCV: 93.8 fL (ref 80.0–100.0)
MCV: 92.9 fL (ref 80.0–100.0)
Platelets: 272 10*3/uL (ref 150–400)
Platelets: 274 10*3/uL (ref 150–400)
RBC: 3.52 MIL/uL — ABNORMAL LOW (ref 3.87–5.11)
RBC: 3.51 MIL/uL — ABNORMAL LOW (ref 3.87–5.11)
RDW: 11.7 % (ref 11.5–15.5)
RDW: 11.6 % (ref 11.5–15.5)
WBC: 8.9 10*3/uL (ref 4.0–10.5)
WBC: 8 10*3/uL (ref 4.0–10.5)
nRBC: 0 % (ref 0.0–0.2)
nRBC: 0 % (ref 0.0–0.2)

## 2022-01-08 LAB — HEPARIN LEVEL (UNFRACTIONATED)
Heparin Unfractionated: <0.1 IU/mL — ABNORMAL LOW (ref 0.30–0.70)
Heparin Unfractionated: 0.29 IU/mL — ABNORMAL LOW (ref 0.30–0.70)

## 2022-01-08 MED ORDER — SODIUM CHLORIDE 0.9 % IV SOLN: 250.0000 mL | INTRAVENOUS | Status: DC | PRN

## 2022-01-08 MED ORDER — SODIUM CHLORIDE 0.9 % IV SOLN
INTRAVENOUS | Status: DC
Start: 2022-01-09 — End: 2022-01-09

## 2022-01-08 MED ORDER — SODIUM CHLORIDE 0.9 % IV SOLN: INTRAVENOUS | Status: DC

## 2022-01-08 MED ORDER — SODIUM CHLORIDE 0.9% FLUSH: 3.0000 mL | INTRAVENOUS | Status: DC | PRN

## 2022-01-08 MED ORDER — HEPARIN BOLUS VIA INFUSION
1000.0000 [IU] | Freq: Once | INTRAVENOUS | Status: AC
  Administered 2022-01-08: 1000 [IU] via INTRAVENOUS
  Filled 2022-01-08: qty 1000

## 2022-01-08 MED ORDER — SODIUM CHLORIDE 0.9 % IV BOLUS
250.0000 mL | Freq: Once | INTRAVENOUS | Status: AC
  Administered 2022-01-08: 250 mL via INTRAVENOUS

## 2022-01-08 MED ORDER — HEPARIN (PORCINE) 25000 UT/250ML-% IV SOLN
1000.0000 [IU]/h | INTRAVENOUS | Status: DC
Start: 2022-01-08 — End: 2022-01-09
  Administered 2022-01-08 – 2022-01-09 (×2): 1000 [IU]/h via INTRAVENOUS
  Filled 2022-01-08 (×2): qty 250

## 2022-01-08 MED ORDER — ASPIRIN 81 MG PO CHEW
81.0000 mg | CHEWABLE_TABLET | ORAL | Status: AC
  Administered 2022-01-09: 81 mg via ORAL
  Filled 2022-01-08: qty 1

## 2022-01-08 NOTE — Hospital Course (Addendum)
21 Y female independent living facility resident HFpEF presented with increased sob with increased BNP and trop. Cardiology was consulted

## 2022-01-08 NOTE — Care Management Important Message (Signed)
Important Message  Patient Details  Name: Ellen Chandler MRN: [id number] Date of Birth: [birth year]   Medicare Important Message Given:  Yes     Shelda Altes 01/08/2022, 8:48 AM

## 2022-01-08 NOTE — Progress Notes (Signed)
Patient BP 77/62. MAP 68. HR 62. MD notified. Awaiting orders.  *275m NS bolus per order started.  KDaymon Larsen RN

## 2022-01-08 NOTE — Progress Notes (Signed)
[place] for heparin  Indication: chest pain/ACS and atrial fibrillation  No Known Allergies  Patient Measurements: Height: '5\' 4"'$  (162.6 cm) Weight: 64.2 kg (141 lb 8.6 oz) IBW/kg (Calculated) : 54.7 Heparin Dosing Weight: 61 kg  Vital Signs: Temp: 97.5 F (36.4 C) (09/04 0338) Temp Source: Oral (09/04 0338) BP: 118/76 (09/04 0521) Pulse Rate: 56 (09/04 0521)  Labs: Recent Labs    01/06/22 0159 01/07/22 0140 01/07/22 1057 01/08/22 0408  HGB 13.3 12.5  --  11.9*  HCT 37.0 33.8*  --  33.0*  PLT 308 276  --  272  HEPARINUNFRC 0.32 0.26* 0.38 <0.10*  CREATININE 0.85 0.88  --  0.89     Estimated Creatinine Clearance: 37.7 mL/min (by C-G formula based on SCr of 0.89 mg/dL).   Medical History: Past Medical History:  Diagnosis Date   Abnormal ECG    a. Pt aware of long hx of abnl ecg->h/o normal stress testing in [place] ~ 2000.   Diastolic dysfunction    a. 04/2013 Echo: EF 60-65%, No rwma, Gr 1 DD, mild MR.   GERD (gastroesophageal reflux disease)    Hypertension    Hypothyroidism     Medications:  Scheduled:   aspirin EC  81 mg Oral Daily   atorvastatin  20 mg Oral Daily   furosemide  40 mg Intravenous BID   metoprolol tartrate  12.5 mg Oral BID   polyethylene glycol  17 g Oral Daily   sodium chloride flush  3 mL Intravenous Q12H    Assessment: 88 yo F with PMH HFpEF presents with trouble breathing. Pt does not take anticoagulants at home according to completed med history or recent fill history. Plan for diagnostic cath on 9/5. Pharmacy is consulted to dose heparin with indication of afib + ACS.   Heparin level undetectable after being held overnight following pump programing issue. CBC stable, no overt s/sx of bleeding reported at this time. Will resume heparin at previous rate.   Goal of Therapy:  Heparin level 0.3-0.7 units/ml Monitor platelets by anticoagulation protocol: Yes   Plan:  Conservative 1000  unit bolus x1 Resume heparin gtt at 1000 units/hr. Check 8hr heparin level  Monitor CBC, Heparin level, s/sx bleeding daily.  Georga Bora, PharmD Clinical Pharmacist 01/08/2022 5:35 AM Please check AMION for all [place] numbers

## 2022-01-08 NOTE — Progress Notes (Signed)
OT Cancellation Note  Patient Details Name: AALANI AIKENS MRN: [id number] DOB: 1930/08/15   Cancelled Treatment:    Reason Eval/Treat Not Completed: Patient declined, no reason specified. Educated in benefits of OOB.   Malka So 01/08/2022, 2:44 PM Cleta Alberts, OTR/L Acute Rehabilitation Services Office: [phone]

## 2022-01-08 NOTE — Progress Notes (Signed)
Patients daughter-in-law notified RN patient had slurred speech that began yesterday. DIL stated she did not report to staff yesterday. NIH performed with no deficits, minimal slurred speech noted. Agricultural consultant notified. MD notified.   Daymon Larsen, RN

## 2022-01-08 NOTE — Progress Notes (Signed)
Rounding Note    Patient Name: Ellen Chandler Date of Encounter: 01/08/2022  [place] Cardiologist: Mertie Moores, MD   Subjective   Feels weak. Continues to feel SOB when laying flat. No chest pain.   Flipped back into NSR yesterday evening Oxygen weaned down to 1L [place] Na worsening to 126 this AM; diuresis being held and urine studies sent  Inpatient Medications    Scheduled Meds:  aspirin EC  81 mg Oral Daily   atorvastatin  20 mg Oral Daily   metoprolol tartrate  12.5 mg Oral BID   polyethylene glycol  17 g Oral Daily   sodium chloride flush  3 mL Intravenous Q12H   Continuous Infusions:  heparin 1,000 Units/hr (01/08/22 0636)   PRN Meds: acetaminophen **OR** acetaminophen, alum & mag hydroxide-simeth, alum & mag hydroxide-simeth, levalbuterol, ondansetron **OR** ondansetron (ZOFRAN) IV   Vital Signs    Vitals:   01/08/22 0338 01/08/22 0521 01/08/22 0529 01/08/22 0744  BP: 98/64 118/76  (!) 113/59  Pulse:  (!) 56  (!) 55  Resp: '17 17  16  '$ Temp: (!) 97.5 F (36.4 C)   97.8 F (36.6 C)  TempSrc: Oral   Oral  SpO2: 96% (!) 89% 93% 95%  Weight:   64.4 kg   Height:        Intake/Output Summary (Last 24 hours) at 01/08/2022 0841 Last data filed at 01/08/2022 0744 Gross per 24 hour  Intake 461.48 ml  Output 1410 ml  Net -948.52 ml      01/08/2022    5:29 AM 01/07/2022    4:17 AM 01/06/2022    4:00 AM  Last 3 Weights  Weight (lbs) 141 lb 15.6 oz 141 lb 8.6 oz 134 lb 7.7 oz  Weight (kg) 64.4 kg 64.2 kg 61 kg      Telemetry    NSR with PVCs - Personally Reviewed  ECG    No new tracing - Personally Reviewed  Physical Exam   GEN: Elderly female, comfortable, sitting up in bed  Neck: No JVD Cardiac: RRR, no murmurs, rubs, or gallops.  Respiratory: Bibasilar crackles (L>R) GI: Soft, nontender, non-distended  MS: No edema; No deformity. Neuro:  Alert and awake, answering questions appropriately Psych: Normal affect   Labs    High  Sensitivity Troponin:   Recent Labs  Lab 01/04/22 0416 01/04/22 0932 01/04/22 1145  TROPONINIHS 123* 128* 102*     Chemistry Recent Labs  Lab 01/05/22 0434 01/06/22 0159 01/07/22 0140 01/08/22 0408  NA 136 130* 127* 126*  K 4.0 3.6 3.3* 3.3*  CL 99 91* 89* 89*  CO2 '28 28 27 28  '$ GLUCOSE 95 122* 87 96  BUN '21 20 21 '$ 28*  CREATININE 1.03* 0.85 0.88 0.89  CALCIUM 9.4 9.6 8.7* 8.4*  MG 2.0  --  1.8  --   PROT 5.7* 6.4* 5.7*  --   ALBUMIN 3.1* 3.6 3.2*  --   AST '19 21 24  '$ --   ALT '22 22 18  '$ --   ALKPHOS 30* 35* 31*  --   BILITOT 0.8 0.7 0.7  --   GFRNONAA 52* >60 >60 >60  ANIONGAP '9 11 11 9    '$ Lipids  Recent Labs  Lab 01/05/22 0434  CHOL 121  TRIG 56  HDL 40*  LDLCALC 70  CHOLHDL 3.0    Hematology Recent Labs  Lab 01/06/22 0159 01/07/22 0140 01/08/22 0408  WBC 10.2 9.8 8.9  RBC 3.95 3.64* 3.52*  HGB 13.3 12.5 11.9*  HCT 37.0 33.8* 33.0*  MCV 93.7 92.9 93.8  MCH 33.7 34.3* 33.8  MCHC 35.9 37.0* 36.1*  RDW 11.4* 11.2* 11.7  PLT 308 276 272   Thyroid No results for input(s): "TSH", "FREET4" in the last 168 hours.  BNP Recent Labs  Lab 01/04/22 0006  BNP 519.6*    DDimer No results for input(s): "DDIMER" in the last 168 hours.   Radiology    No results found.  Cardiac Studies   01/04/22: IMPRESSIONS   1. Left ventricular ejection fraction, by estimation, is 40 to 45%. The  left ventricle has mildly decreased function. The left ventricle has no  regional wall motion abnormalities. Left ventricular diastolic parameters  are consistent with Grade I  diastolic dysfunction (impaired relaxation). Elevated left ventricular  end-diastolic pressure.   2. Right ventricular systolic function is normal. The right ventricular  size is normal. There is normal pulmonary artery systolic pressure.   3. Left atrial size was moderately dilated.   4. The mitral valve is normal in structure. Trivial mitral valve  regurgitation. No evidence of mitral stenosis.    5. The aortic valve is tricuspid. There is mild calcification of the  aortic valve. There is mild thickening of the aortic valve. Aortic valve  regurgitation is trivial. No aortic stenosis is present.   6. The inferior vena cava is normal in size with greater than 50%  respiratory variability, suggesting right atrial pressure of 3 mmHg.   Patient Profile     88 y.o. female with history of HTN, HFpEF, GERD, and hypothyroidism who presented with worsening dyspnea found to have acute systolic HF exacerbation with LVEF 40-45% on TTE (from 60-65% in 2014) with course complicated by new Aflutter with RVR. Cardiology is consulted for further management of acute systolic HF and new onset Aflutter with RVR.   Assessment & Plan    #Acute Systolic HF: Patient presented with worsening dyspnea found to have BNP 519, pulmonary edema on chest imaging, and new drop in EF to 40-45% consistent with acute systolic HF exacerbation. Was scheduled for cath as part of ischemic work-up but this was cancelled due to patient lethargy. Given improvement in mental status, her procedure has been rescheduled for Tuesday. Overall volume status improved since admission and O2 requirement has been weaned. Holding diuresis for now given worsening hyponatremia. -Plan for RHC/LHC tentatively on Tuesday  -Hold diuresis for now given worsening hyponatremia -Continue metop 12.'5mg'$  BID -GDMT limited due to hypotension throughout admission; will add as able -Monitor I/Os and daily weights  #Newly Diagnosed Aflutter with RVR: CHADs-vasc 5-6. Newly diagnosed on this admission. Was initially on dilt gtt but became hypotensive and then transitioned to amiodarone (but reportedly became more confused) and ultimately placed on metoprolol. Converted back to NSR this on 01/07/22 and remains in rhythm today. BP is tolerating low dose metop. -On heparin gtt while awaiting cath -On metop 12.'5mg'$  BID  #Hyponatremia: Worsening and down to 126 this  AM. Will hold further diuresis for now. Further work-up per primary tea,.   #NSTEMI: Trop [id number]. Likely demand in the setting of acute systolic HF exacerbation. Planning for ischemic work-up on Tuesday given drop in EF pending clinical stability. -Plan for RHC/LHC tentatively on Tuesday  -Continue ASA '81mg'$  daily, continue lipitor '20mg'$  daily -Continue metop 12.'5mg'$  BID      For questions or updates, please contact [place] Please consult [email or web address] for contact info under  Signed, Freada Bergeron, MD  01/08/2022, 8:41 AM

## 2022-01-08 NOTE — Progress Notes (Signed)
[place] for heparin  Indication: chest pain/ACS and atrial fibrillation  No Known Allergies  Patient Measurements: Height: '5\' 4"'$  (162.6 cm) Weight: 64.4 kg (141 lb 15.6 oz) IBW/kg (Calculated) : 54.7 Heparin Dosing Weight: 61 kg  Vital Signs: Temp: 97.7 F (36.5 C) (09/04 1109) Temp Source: Oral (09/04 1109) BP: 77/62 (09/04 1256) Pulse Rate: 59 (09/04 1256)  Labs: Recent Labs    01/06/22 0159 01/07/22 0140 01/07/22 1057 01/08/22 0408 01/08/22 1335  HGB 13.3 12.5  --  11.9* 11.7*  HCT 37.0 33.8*  --  33.0* 32.6*  PLT 308 276  --  272 274  HEPARINUNFRC 0.32 0.26* 0.38 <0.10* 0.29*  CREATININE 0.85 0.88  --  0.89  --      Estimated Creatinine Clearance: 37.7 mL/min (by C-G formula based on SCr of 0.89 mg/dL).   Medical History: Past Medical History:  Diagnosis Date   Abnormal ECG    a. Pt aware of long hx of abnl ecg->h/o normal stress testing in [place] ~ 2000.   Diastolic dysfunction    a. 04/2013 Echo: EF 60-65%, No rwma, Gr 1 DD, mild MR.   GERD (gastroesophageal reflux disease)    Hypertension    Hypothyroidism     Medications:  Scheduled:   aspirin EC  81 mg Oral Daily   atorvastatin  20 mg Oral Daily   metoprolol tartrate  12.5 mg Oral BID   polyethylene glycol  17 g Oral Daily   sodium chloride flush  3 mL Intravenous Q12H    Assessment: 88 yo F with PMH HFpEF presents with trouble breathing. Pt does not take anticoagulants at home according to completed med history or recent fill history. Plan for diagnostic cath on 9/5. Pharmacy is consulted to dose heparin with indication of afib + ACS.   Heparin level undetectable 9/4 AM after being held overnight following pump programing issue--patient re-bolused and started previous rate of 1000 units/hr.   Heparin level is now slightly subtherapeutic at 0.29, on 1000 units/hr. Hgb 11.7, plt 274. No line issues or signs/symptoms of bleeding reported. Will  continue current rate as patient was previously therapeutic on 900 units/hr and is likely still accumulating after gtt was held last night.   Goal of Therapy:  Heparin level 0.3-0.7 units/ml Monitor platelets by anticoagulation protocol: Yes   Plan:  Continue heparin gtt at 1000 units/hr. Check 8hr heparin level  Monitor CBC, Heparin level, s/sx bleeding daily.   Billey Gosling, PharmD PGY1 Pharmacy Resident 9/4/20233:21 PM

## 2022-01-08 NOTE — Progress Notes (Signed)
PROGRESS NOTE   Ellen MARCEAUX  BJY:[id number] DOB: 1930-12-28 DOA: 01/03/2022 PCP: Deland Pretty, MD  Brief Narrative:  77 Y female independent living facility resident HFpEF no CAD HTN with complications of hypotension in the past HLD hypothyroid reflux DM TY 2 Known ethanol habituation in the past   Was talking on daughter over the phone and patient became short of breath associated with chest pain placed on NIPPV  BNP noted to be elevated, troponin 123 CXR suggestive of PNA but on my over read not convinced there does seem to be some increased interstitial opacities on the right side BUNs/creatinine 29/1.03--> 27/0.89   COVID is negative--EKG showed PVCs with ST inversions Patient subsequently flipped into A-fib RVR  Cardiology consulted secondary to elevated troponin A-fib RVR and discussed cath see below  Hospital-Problem based course  Somnolence, metabolic encephalopathy on 9/1 Now resolved-secondary to poor sleep   NSTEMI, troponin elevation, T wave changes-EF 40-45% with decreased function but no wall motion abnormality poor CABG candidate--defer cath timing to cardiology Hypotension limiting GDMT given bolus of 250 cc/h, saline at 50 cc/H:See below continue ASA 81, heparin GTT on board still  Acute hyponatremia likely secondary to tea toast potomania Sodium dropping to the 126 range from 138 probably drinking more than eating-fluid restrict to 1200 cc, Lasix held today Urine studies ordered and still pending  fluid challenge -- hypotensive with MAP in low 60s probably secondary to beta-blocker  Patient new onset A-fib/flutter RVR CHADVASC >3 has bled score less than 2 Hypotensive when placed on Cardizem drip 8/31-encephalopathic on [place] (this is not a known side effect)-on Toprol 12.5 twice daily with hold parameters per cardiology Follow TSH as outpatient  Acute decompensated HFrEF EF 40 to 50% Required transient BiPAP in emergency room Diuresis as per  cardiology Lasix 40 twice daily, - 4 L so far, weights are inaccurate  Impaired glucose tolerance A1c is probably less than 6.2 based on bedside CBG readings-cancel A1c  Prior ethanol habituation- No real signs or symptoms of DTs at this time-okay to discontinue  DVT prophylaxis: Heparin GTT Code Status: Full Family Communication: Patient at the bedside with Brad at [facility]  [phone] on 01/08/2022 Disposition:  Status is: Inpatient Remains inpatient appropriate because:   Awaiting cardiac cath and will need therapy eval postprocedure as is at independent living   Consultants:  Cardiology  Procedures:   Antimicrobials: None   Subjective:  More alert although nursing somewhat concerned about some somnolence and facial asymmetry-to me it just seems like she is somnolent-she is much improved compared to the prior day I do not see any focal deficits oriented no distress no dizziness    Objective: Vitals:   01/08/22 0529 01/08/22 0744 01/08/22 1109 01/08/22 1256  BP:  (!) 113/59 96/74 (!) 77/62  Pulse:  (!) 55 (!) 57 (!) 59  Resp:  '16 17 15  '$ Temp:  97.8 F (36.6 C) 97.7 F (36.5 C)   TempSrc:  Oral Oral   SpO2: 93% 95% 94% 93%  Weight: 64.4 kg     Height:        Intake/Output Summary (Last 24 hours) at 01/08/2022 1550 Last data filed at 01/08/2022 1108 Gross per 24 hour  Intake 221.48 ml  Output 1150 ml  Net -928.52 ml    Filed Weights   01/06/22 0400 01/07/22 0417 01/08/22 0529  Weight: 61 kg 64.2 kg 64.4 kg    Examination:  Oriented no distress no dizziness Chest relatively clear no  wheeze rales rhonchi Abdomen soft no rebound no guarding S1-S2 cannot appreciate any JVD probable murmur-A-fib on monitors ranging from 80-110,  Neuro intact moving 4 limbs equally no focal deficit Psych affect is flat No lower extremity edema   Data Reviewed: personally reviewed   CBC    Component Value Date/Time   WBC 8.0 01/08/2022 1335   RBC 3.51  (L) 01/08/2022 1335   HGB 11.7 (L) 01/08/2022 1335   HCT 32.6 (L) 01/08/2022 1335   PLT 274 01/08/2022 1335   MCV 92.9 01/08/2022 1335   MCH 33.3 01/08/2022 1335   MCHC 35.9 01/08/2022 1335   RDW 11.6 01/08/2022 1335   LYMPHSABS 1.5 06/17/2021 1117   MONOABS 0.6 06/17/2021 1117   EOSABS 0.1 06/17/2021 1117   BASOSABS 0.0 06/17/2021 1117      Latest Ref Rng & Units 01/08/2022    4:08 AM 01/07/2022    1:40 AM 01/06/2022    1:59 AM  CMP  Glucose 70 - 99 mg/dL 96  87  122   BUN 8 - 23 mg/dL '28  21  20   '$ Creatinine 0.44 - 1.00 mg/dL 0.89  0.88  0.85   Sodium 135 - 145 mmol/L 126  127  130   Potassium 3.5 - 5.1 mmol/L 3.3  3.3  3.6   Chloride 98 - 111 mmol/L 89  89  91   CO2 22 - 32 mmol/L '28  27  28   '$ Calcium 8.9 - 10.3 mg/dL 8.4  8.7  9.6   Total Protein 6.5 - 8.1 g/dL  5.7  6.4   Total Bilirubin 0.3 - 1.2 mg/dL  0.7  0.7   Alkaline Phos 38 - 126 U/L  31  35   AST 15 - 41 U/L  24  21   ALT 0 - 44 U/L  18  22      Radiology Studies: No results found.   Scheduled Meds:  aspirin EC  81 mg Oral Daily   atorvastatin  20 mg Oral Daily   metoprolol tartrate  12.5 mg Oral BID   polyethylene glycol  17 g Oral Daily   sodium chloride flush  3 mL Intravenous Q12H   Continuous Infusions:  sodium chloride     heparin 1,000 Units/hr (01/08/22 0636)     LOS: 4 days   Time spent: [place], MD [facility] To contact the attending provider between 7A-7P or the covering provider during after hours 7P-7A, please log into the web site [email or web address] and access using universal [place] password for that web site. If you do not have the password, please call the hospital operator.  01/08/2022, 3:50 PM

## 2022-01-09 ENCOUNTER — Encounter (HOSPITAL_COMMUNITY): Payer: Self-pay | Admitting: Interventional Cardiology

## 2022-01-09 ENCOUNTER — Encounter (HOSPITAL_COMMUNITY)
Admission: EM | Disposition: A | Discharge status: Skilled Nursing Facility | Payer: Self-pay | Source: Skilled Nursing Facility | Attending: Family Medicine

## 2022-01-09 DIAGNOSIS — I480 Paroxysmal atrial fibrillation: Secondary | ICD-10-CM | POA: Diagnosis not present

## 2022-01-09 DIAGNOSIS — I10 Essential (primary) hypertension: Secondary | ICD-10-CM | POA: Diagnosis not present

## 2022-01-09 DIAGNOSIS — I214 Non-ST elevation (NSTEMI) myocardial infarction: Secondary | ICD-10-CM | POA: Diagnosis not present

## 2022-01-09 DIAGNOSIS — I5031 Acute diastolic (congestive) heart failure: Secondary | ICD-10-CM

## 2022-01-09 HISTORY — PX: RIGHT/LEFT HEART CATH AND CORONARY ANGIOGRAPHY: CATH118266

## 2022-01-09 LAB — POCT I-STAT 7, (LYTES, BLD GAS, ICA,H+H)
Acid-Base Excess: 3 mmol/L — ABNORMAL HIGH (ref 0.0–2.0)
Bicarbonate: 27.8 mmol/L (ref 20.0–28.0)
Calcium, Ion: 1.1 mmol/L — ABNORMAL LOW (ref 1.15–1.40)
HCT: 35 % — ABNORMAL LOW (ref 36.0–46.0)
Hemoglobin: 11.9 g/dL — ABNORMAL LOW (ref 12.0–15.0)
O2 Saturation: 94 %
Potassium: 3.7 mmol/L (ref 3.5–5.1)
Sodium: 135 mmol/L (ref 135–145)
TCO2: 29 mmol/L (ref 22–32)
pCO2 arterial: 41.4 mmHg (ref 32–48)
pH, Arterial: 7.435 (ref 7.35–7.45)
pO2, Arterial: 68 mmHg — ABNORMAL LOW (ref 83–108)

## 2022-01-09 LAB — BASIC METABOLIC PANEL
Anion gap: 7 (ref 5–15)
BUN: 24 mg/dL — ABNORMAL HIGH (ref 8–23)
CO2: 29 mmol/L (ref 22–32)
Calcium: 8.2 mg/dL — ABNORMAL LOW (ref 8.9–10.3)
Chloride: 97 mmol/L — ABNORMAL LOW (ref 98–111)
Creatinine, Ser: 0.88 mg/dL (ref 0.44–1.00)
GFR, Estimated: >60 mL/min (ref >60)
Glucose, Bld: 97 mg/dL (ref 70–99)
Potassium: 3.6 mmol/L (ref 3.5–5.1)
Sodium: 133 mmol/L — ABNORMAL LOW (ref 135–145)

## 2022-01-09 LAB — HEPARIN LEVEL (UNFRACTIONATED)
Heparin Unfractionated: 0.33 IU/mL (ref 0.30–0.70)
Heparin Unfractionated: 0.39 IU/mL (ref 0.30–0.70)

## 2022-01-09 LAB — CULTURE, BLOOD (ROUTINE X 2)
Culture: NO GROWTH
Culture: NO GROWTH
Special Requests: ADEQUATE
Special Requests: ADEQUATE

## 2022-01-09 LAB — POCT I-STAT EG7
Acid-Base Excess: 5 mmol/L — ABNORMAL HIGH (ref 0.0–2.0)
Acid-Base Excess: 4 mmol/L — ABNORMAL HIGH (ref 0.0–2.0)
Acid-Base Excess: 4 mmol/L — ABNORMAL HIGH (ref 0.0–2.0)
Bicarbonate: 30 mmol/L — ABNORMAL HIGH (ref 20.0–28.0)
Bicarbonate: 28.7 mmol/L — ABNORMAL HIGH (ref 20.0–28.0)
Bicarbonate: 29.9 mmol/L — ABNORMAL HIGH (ref 20.0–28.0)
Calcium, Ion: 1.14 mmol/L — ABNORMAL LOW (ref 1.15–1.40)
Calcium, Ion: 1.11 mmol/L — ABNORMAL LOW (ref 1.15–1.40)
Calcium, Ion: 1.14 mmol/L — ABNORMAL LOW (ref 1.15–1.40)
HCT: 36 % (ref 36.0–46.0)
HCT: 35 % — ABNORMAL LOW (ref 36.0–46.0)
HCT: 35 % — ABNORMAL LOW (ref 36.0–46.0)
Hemoglobin: 12.2 g/dL (ref 12.0–15.0)
Hemoglobin: 11.9 g/dL — ABNORMAL LOW (ref 12.0–15.0)
Hemoglobin: 11.9 g/dL — ABNORMAL LOW (ref 12.0–15.0)
O2 Saturation: 71 %
O2 Saturation: 67 %
O2 Saturation: 77 %
Potassium: 3.7 mmol/L (ref 3.5–5.1)
Potassium: 3.7 mmol/L (ref 3.5–5.1)
Potassium: 3.7 mmol/L (ref 3.5–5.1)
Sodium: 135 mmol/L (ref 135–145)
Sodium: 135 mmol/L (ref 135–145)
Sodium: 135 mmol/L (ref 135–145)
TCO2: 31 mmol/L (ref 22–32)
TCO2: 30 mmol/L (ref 22–32)
TCO2: 31 mmol/L (ref 22–32)
pCO2, Ven: 46.9 mmHg (ref 44–60)
pCO2, Ven: 44.7 mmHg (ref 44–60)
pCO2, Ven: 47.2 mmHg (ref 44–60)
pH, Ven: 7.414 (ref 7.25–7.43)
pH, Ven: 7.41 (ref 7.25–7.43)
pH, Ven: 7.415 (ref 7.25–7.43)
pO2, Ven: 37 mmHg (ref 32–45)
pO2, Ven: 35 mmHg (ref 32–45)
pO2, Ven: 42 mmHg (ref 32–45)

## 2022-01-09 LAB — CBC
HCT: 32.4 % — ABNORMAL LOW (ref 36.0–46.0)
Hemoglobin: 11.3 g/dL — ABNORMAL LOW (ref 12.0–15.0)
MCH: 33.5 pg (ref 26.0–34.0)
MCHC: 34.9 g/dL (ref 30.0–36.0)
MCV: 96.1 fL (ref 80.0–100.0)
Platelets: 258 10*3/uL (ref 150–400)
RBC: 3.37 MIL/uL — ABNORMAL LOW (ref 3.87–5.11)
RDW: 11.6 % (ref 11.5–15.5)
WBC: 7.8 10*3/uL (ref 4.0–10.5)
nRBC: 0 % (ref 0.0–0.2)

## 2022-01-09 LAB — POCT ACTIVATED CLOTTING TIME
Activated Clotting Time: 197 seconds
Activated Clotting Time: 347 seconds

## 2022-01-09 SURGERY — RIGHT/LEFT HEART CATH AND CORONARY ANGIOGRAPHY
Anesthesia: LOCAL

## 2022-01-09 MED ORDER — HEPARIN (PORCINE) IN NACL 1000-0.9 UT/500ML-% IV SOLN
INTRAVENOUS | Status: AC
  Filled 2022-01-09: qty 1000

## 2022-01-09 MED ORDER — SODIUM CHLORIDE 0.9% FLUSH
3.0000 mL | Freq: Two times a day (BID) | INTRAVENOUS | Status: DC
  Administered 2022-01-10 – 2022-01-12 (×5): 3 mL via INTRAVENOUS

## 2022-01-09 MED ORDER — IOHEXOL 350 MG/ML SOLN
INTRAVENOUS | Status: DC | PRN
  Administered 2022-01-09: 45 mL

## 2022-01-09 MED ORDER — LIDOCAINE HCL (PF) 1 % IJ SOLN
INTRAMUSCULAR | Status: AC
  Filled 2022-01-09: qty 30

## 2022-01-09 MED ORDER — SODIUM CHLORIDE 0.9 % IV SOLN: 250.0000 mL | INTRAVENOUS | Status: DC | PRN

## 2022-01-09 MED ORDER — LABETALOL HCL 5 MG/ML IV SOLN
10.0000 mg | INTRAVENOUS | Status: AC | PRN
Start: 2022-01-09 — End: 2022-01-10

## 2022-01-09 MED ORDER — ACETAMINOPHEN 325 MG PO TABS: 650.0000 mg | ORAL_TABLET | ORAL | Status: DC | PRN

## 2022-01-09 MED ORDER — HEPARIN SODIUM (PORCINE) 1000 UNIT/ML IJ SOLN
INTRAMUSCULAR | Status: DC | PRN
  Administered 2022-01-09: 5000 [IU] via INTRAVENOUS

## 2022-01-09 MED ORDER — VERAPAMIL HCL 2.5 MG/ML IV SOLN
INTRAVENOUS | Status: AC
  Filled 2022-01-09: qty 2

## 2022-01-09 MED ORDER — LIDOCAINE HCL (PF) 1 % IJ SOLN
INTRAMUSCULAR | Status: DC | PRN
  Administered 2022-01-09 (×2): 2 mL
  Administered 2022-01-09: 10 mL

## 2022-01-09 MED ORDER — MIDAZOLAM HCL 2 MG/2ML IJ SOLN
INTRAMUSCULAR | Status: AC
  Filled 2022-01-09: qty 2

## 2022-01-09 MED ORDER — FENTANYL CITRATE (PF) 100 MCG/2ML IJ SOLN
INTRAMUSCULAR | Status: AC
  Filled 2022-01-09: qty 2

## 2022-01-09 MED ORDER — HEPARIN SODIUM (PORCINE) 1000 UNIT/ML IJ SOLN
INTRAMUSCULAR | Status: AC
  Filled 2022-01-09: qty 10

## 2022-01-09 MED ORDER — HEPARIN (PORCINE) IN NACL 1000-0.9 UT/500ML-% IV SOLN
INTRAVENOUS | Status: DC | PRN
  Administered 2022-01-09 (×2): 500 mL

## 2022-01-09 MED ORDER — SODIUM CHLORIDE 0.9% FLUSH: 3.0000 mL | INTRAVENOUS | Status: DC | PRN

## 2022-01-09 MED ORDER — SODIUM CHLORIDE 0.9 % IV SOLN: INTRAVENOUS | Status: AC

## 2022-01-09 MED ORDER — FENTANYL CITRATE (PF) 100 MCG/2ML IJ SOLN
INTRAMUSCULAR | Status: DC | PRN
  Administered 2022-01-09: 25 ug via INTRAVENOUS

## 2022-01-09 MED ORDER — MIDAZOLAM HCL 2 MG/2ML IJ SOLN
INTRAMUSCULAR | Status: DC | PRN
  Administered 2022-01-09: 1 mg via INTRAVENOUS

## 2022-01-09 MED ORDER — ONDANSETRON HCL 4 MG/2ML IJ SOLN: 4.0000 mg | Freq: Four times a day (QID) | INTRAMUSCULAR | Status: DC | PRN

## 2022-01-09 MED ORDER — VERAPAMIL HCL 2.5 MG/ML IV SOLN
INTRAVENOUS | Status: DC | PRN
  Administered 2022-01-09: 10 mL via INTRA_ARTERIAL

## 2022-01-09 MED ORDER — HYDRALAZINE HCL 20 MG/ML IJ SOLN: 10.0000 mg | INTRAMUSCULAR | Status: AC | PRN

## 2022-01-09 MED ORDER — HEPARIN (PORCINE) 25000 UT/250ML-% IV SOLN
1000.0000 [IU]/h | INTRAVENOUS | Status: DC
  Administered 2022-01-09: 1000 [IU]/h via INTRAVENOUS
  Filled 2022-01-09: qty 250

## 2022-01-09 MED ORDER — SODIUM CHLORIDE 0.9% FLUSH
3.0000 mL | Freq: Two times a day (BID) | INTRAVENOUS | Status: DC
Start: 2022-01-09 — End: 2022-01-09
  Administered 2022-01-09: 3 mL via INTRAVENOUS

## 2022-01-09 SURGICAL SUPPLY — 16 items
BAND ZEPHYR COMPRESS 30 LONG (HEMOSTASIS) IMPLANT
CATH BALLN WEDGE 5F 110CM (CATHETERS) IMPLANT
CATH INFINITI 5FR MULTPACK ANG (CATHETERS) IMPLANT
CATH OPTITORQUE TIG 4.0 6F (CATHETERS) IMPLANT
GLIDESHEATH SLEND SS 6F .021 (SHEATH) IMPLANT
GUIDEWIRE TIGER .035X300 (WIRE) IMPLANT
KIT HEART LEFT (KITS) ×1 IMPLANT
PACK CARDIAC CATHETERIZATION (CUSTOM PROCEDURE TRAY) ×1 IMPLANT
SHEATH GLIDE SLENDER 4/5FR (SHEATH) IMPLANT
SHEATH PINNACLE 5F 10CM (SHEATH) IMPLANT
SHEATH PROBE COVER 6X72 (BAG) IMPLANT
TRANSDUCER W/STOPCOCK (MISCELLANEOUS) ×1 IMPLANT
TUBING CIL FLEX 10 FLL-RA (TUBING) ×1 IMPLANT
WIRE EMERALD 3MM-J .035X150CM (WIRE) IMPLANT
WIRE EMERALD 3MM-J .035X260CM (WIRE) IMPLANT
WIRE MICRO SET SILHO 5FR 7 (SHEATH) IMPLANT

## 2022-01-09 NOTE — Progress Notes (Signed)
ANTICOAGULATION CONSULT NOTE   Pharmacy Consult for heparin  Indication: chest pain/ACS and atrial fibrillation  No Known Allergies  Patient Measurements: Height: '5\' 4"'$  (162.6 cm) Weight: 64.4 kg (142 lb) IBW/kg (Calculated) : 54.7 Heparin Dosing Weight: 61 kg  Vital Signs: Temp: 98.1 F (36.7 C) (09/05 1932) Temp Source: Oral (09/05 1932) BP: 140/60 (09/05 1932) Pulse Rate: 62 (09/05 1932)  Labs: Recent Labs    01/07/22 0140 01/07/22 1057 01/08/22 0408 01/08/22 1335 01/08/22 2326 01/09/22 0221 01/09/22 1606 01/09/22 1611  HGB 12.5  --  11.9* 11.7*  --  11.3* 11.9* 12.2  HCT 33.8*  --  33.0* 32.6*  --  32.4* 35.0* 36.0  PLT 276  --  272 274  --  258  --   --   HEPARINUNFRC 0.26*   < > <0.10* 0.29* 0.33 0.39  --   --   CREATININE 0.88  --  0.89  --   --  0.88  --   --    < > = values in this interval not displayed.    Estimated Creatinine Clearance: 38.2 mL/min (by C-G formula based on SCr of 0.88 mg/dL).   Medical History: Past Medical History:  Diagnosis Date   Abnormal ECG    a. Pt aware of long hx of abnl ecg->h/o normal stress testing in [place] ~ 2000.   Diastolic dysfunction    a. 04/2013 Echo: EF 60-65%, No rwma, Gr 1 DD, mild MR.   GERD (gastroesophageal reflux disease)    Hypertension    Hypothyroidism     Medications:  Scheduled:   aspirin EC  81 mg Oral Daily   atorvastatin  20 mg Oral Daily   polyethylene glycol  17 g Oral Daily   sodium chloride flush  3 mL Intravenous Q12H   sodium chloride flush  3 mL Intravenous Q12H    Assessment: 88 yo F with PMH HFpEF presents with trouble breathing. Pt does not take anticoagulants at home according to completed med history or recent fill history. Plan for diagnostic cath on 9/5. Pharmacy is consulted to dose heparin with indication of afib + ACS.   Patient now post cath with TR band in place.  Orders received to resume Heparin 2 hours after TR band removed for indication of atrial  fibrillation.  Prior therapeutic on heparin drip rate of 1000 units/hr. CBC stable.  No line issues or signs/symptoms of bleeding reported. TR band removed 20:30 PM.   Goal of Therapy:  Heparin level 0.3-0.7 units/ml Monitor platelets by anticoagulation protocol: Yes   Plan:  No bolus.  Resume Heparin at 1000 units/hr 2 hours after TR band removed.  Heparin level in 8 hours.  Monitor CBC, Heparin level, s/sx bleeding daily.  Sloan Leiter, PharmD, BCPS, BCCCP Clinical Pharmacist Please refer to [facility] for [place] numbers 01/09/2022 8:04 PM

## 2022-01-09 NOTE — Progress Notes (Signed)
ANTICOAGULATION CONSULT NOTE   Pharmacy Consult for heparin  Indication: chest pain/ACS and atrial fibrillation  No Known Allergies  Patient Measurements: Height: '5\' 4"'$  (162.6 cm) Weight: 64.4 kg (142 lb) IBW/kg (Calculated) : 54.7 Heparin Dosing Weight: 61 kg  Vital Signs: Temp: 97.5 F (36.4 C) (09/05 0449) Temp Source: Oral (09/05 0449) BP: 94/76 (09/05 0449) Pulse Rate: 54 (09/05 0449)  Labs: Recent Labs    01/07/22 0140 01/07/22 1057 01/08/22 0408 01/08/22 1335 01/08/22 2326 01/09/22 0221  HGB 12.5  --  11.9* 11.7*  --  11.3*  HCT 33.8*  --  33.0* 32.6*  --  32.4*  PLT 276  --  272 274  --  258  HEPARINUNFRC 0.26*   < > <0.10* 0.29* 0.33 0.39  CREATININE 0.88  --  0.89  --   --  0.88   < > = values in this interval not displayed.     Estimated Creatinine Clearance: 38.2 mL/min (by C-G formula based on SCr of 0.88 mg/dL).   Medical History: Past Medical History:  Diagnosis Date   Abnormal ECG    a. Pt aware of long hx of abnl ecg->h/o normal stress testing in [place] ~ 2000.   Diastolic dysfunction    a. 04/2013 Echo: EF 60-65%, No rwma, Gr 1 DD, mild MR.   GERD (gastroesophageal reflux disease)    Hypertension    Hypothyroidism     Medications:  Scheduled:   aspirin EC  81 mg Oral Daily   atorvastatin  20 mg Oral Daily   polyethylene glycol  17 g Oral Daily   sodium chloride flush  3 mL Intravenous Q12H    Assessment: 88 yo F with PMH HFpEF presents with trouble breathing. Pt does not take anticoagulants at home according to completed med history or recent fill history. Plan for diagnostic cath on 9/5. Pharmacy is consulted to dose heparin with indication of afib + ACS.    Heparin level is 0.39 at goal on heparin drip rate  1000 units/hr. Hgb 11 stable plt 200s stable  No line issues or signs/symptoms of bleeding reported.  Goal of Therapy:  Heparin level 0.3-0.7 units/ml Monitor platelets by anticoagulation protocol: Yes   Plan:   Continue heparin gtt at 1000 units/hr. Monitor CBC, Heparin level, s/sx bleeding daily.   Bonnita Nasuti Pharm.D. CPP, BCPS Clinical Pharmacist [phone] 01/09/2022 8:24 AM

## 2022-01-09 NOTE — Progress Notes (Signed)
Rounding Note    Patient Name: Ellen Chandler Date of Encounter: 01/09/2022  [place] Cardiologist: Mertie Moores, MD   Subjective   Feels much better this morning. Weaned off oxygen. No chest pain or SOB. Mental status significantly improved.   Na improved to 133 this AM with holding diuresis and giving IVF  Planned for RHC/LHC today   Inpatient Medications    Scheduled Meds:  aspirin EC  81 mg Oral Daily   atorvastatin  20 mg Oral Daily   metoprolol tartrate  12.5 mg Oral BID   polyethylene glycol  17 g Oral Daily   sodium chloride flush  3 mL Intravenous Q12H   Continuous Infusions:  sodium chloride     sodium chloride     heparin 1,000 Units/hr (01/09/22 0650)   PRN Meds: sodium chloride, acetaminophen **OR** acetaminophen, alum & mag hydroxide-simeth, alum & mag hydroxide-simeth, levalbuterol, ondansetron **OR** ondansetron (ZOFRAN) IV, sodium chloride flush   Vital Signs    Vitals:   01/08/22 1603 01/08/22 2030 01/09/22 0034 01/09/22 0449  BP: 100/63 (!) 104/53 113/60 94/76  Pulse: 60 (!) 59 (!) 58 (!) 54  Resp: '17 19 16 18  '$ Temp: (!) 97.5 F (36.4 C) 97.9 F (36.6 C) 97.6 F (36.4 C) (!) 97.5 F (36.4 C)  TempSrc: Oral Oral Oral Oral  SpO2: 96% 96% 98% 95%  Weight:    64.4 kg  Height:        Intake/Output Summary (Last 24 hours) at 01/09/2022 0749 Last data filed at 01/09/2022 0531 Gross per 24 hour  Intake 937.12 ml  Output 1700 ml  Net -762.88 ml       01/09/2022    4:49 AM 01/08/2022    5:29 AM 01/07/2022    4:17 AM  Last 3 Weights  Weight (lbs) 142 lb 141 lb 15.6 oz 141 lb 8.6 oz  Weight (kg) 64.411 kg 64.4 kg 64.2 kg      Telemetry    NSR with frequent PVCs - Personally Reviewed  ECG    No new tracing - Personally Reviewed  Physical Exam   GEN: Elderly female, comfortable, laying in bed Neck: No JVD Cardiac: RRR, no murmurs, rubs, or gallops.  Respiratory: Bibasilar crackles (L>R) GI: Soft, nontender,  non-distended  MS: No edema; No deformity. Neuro:  Alert and awake, answering questions appropriately Psych: Normal affect   Labs    High Sensitivity Troponin:   Recent Labs  Lab 01/04/22 0416 01/04/22 0932 01/04/22 1145  TROPONINIHS 123* 128* 102*      Chemistry Recent Labs  Lab 01/05/22 0434 01/06/22 0159 01/07/22 0140 01/08/22 0408 01/09/22 0221  NA 136 130* 127* 126* 133*  K 4.0 3.6 3.3* 3.3* 3.6  CL 99 91* 89* 89* 97*  CO2 '28 28 27 28 29  '$ GLUCOSE 95 122* 87 96 97  BUN '21 20 21 '$ 28* 24*  CREATININE 1.03* 0.85 0.88 0.89 0.88  CALCIUM 9.4 9.6 8.7* 8.4* 8.2*  MG 2.0  --  1.8  --   --   PROT 5.7* 6.4* 5.7*  --   --   ALBUMIN 3.1* 3.6 3.2*  --   --   AST '19 21 24  '$ --   --   ALT '22 22 18  '$ --   --   ALKPHOS 30* 35* 31*  --   --   BILITOT 0.8 0.7 0.7  --   --   GFRNONAA 52* >60 >60 >60 >60  ANIONGAP 9  $'11 11 9 7     'Z$ Lipids  Recent Labs  Lab 01/05/22 0434  CHOL 121  TRIG 56  HDL 40*  LDLCALC 70  CHOLHDL 3.0     Hematology Recent Labs  Lab 01/08/22 0408 01/08/22 1335 01/09/22 0221  WBC 8.9 8.0 7.8  RBC 3.52* 3.51* 3.37*  HGB 11.9* 11.7* 11.3*  HCT 33.0* 32.6* 32.4*  MCV 93.8 92.9 96.1  MCH 33.8 33.3 33.5  MCHC 36.1* 35.9 34.9  RDW 11.7 11.6 11.6  PLT 272 274 258    Thyroid No results for input(s): "TSH", "FREET4" in the last 168 hours.  BNP Recent Labs  Lab 01/04/22 0006  BNP 519.6*     DDimer No results for input(s): "DDIMER" in the last 168 hours.   Radiology    No results found.  Cardiac Studies   01/04/22: IMPRESSIONS   1. Left ventricular ejection fraction, by estimation, is 40 to 45%. The  left ventricle has mildly decreased function. The left ventricle has no  regional wall motion abnormalities. Left ventricular diastolic parameters  are consistent with Grade I  diastolic dysfunction (impaired relaxation). Elevated left ventricular  end-diastolic pressure.   2. Right ventricular systolic function is normal. The right  ventricular  size is normal. There is normal pulmonary artery systolic pressure.   3. Left atrial size was moderately dilated.   4. The mitral valve is normal in structure. Trivial mitral valve  regurgitation. No evidence of mitral stenosis.   5. The aortic valve is tricuspid. There is mild calcification of the  aortic valve. There is mild thickening of the aortic valve. Aortic valve  regurgitation is trivial. No aortic stenosis is present.   6. The inferior vena cava is normal in size with greater than 50%  respiratory variability, suggesting right atrial pressure of 3 mmHg.   Patient Profile     88 y.o. female with history of HTN, HFpEF, GERD, and hypothyroidism who presented with worsening dyspnea found to have acute systolic HF exacerbation with LVEF 40-45% on TTE (from 60-65% in 2014) with course complicated by new Aflutter with RVR. Cardiology is consulted for further management of acute systolic HF and new onset Aflutter with RVR.   Assessment & Plan    #Acute Systolic HF: Patient presented with worsening dyspnea found to have BNP 519, pulmonary edema on chest imaging, and new drop in EF to 40-45% consistent with acute systolic HF exacerbation. Was scheduled for cath as part of ischemic work-up but this was cancelled due to patient lethargy. Given improvement in mental status, her procedure has been rescheduled for today. Overall volume status improved since admission and O2 requirement has been weaned. Holding diuresis for now given worsening hyponatremia. -Plan for RHC/LHC today; discussed with patient and son at bedside and they are amenable to proceed -Hold diuresis for now given worsening hyponatremia with diuretics; improved overnight with IVF -Hold metop for now given soft blood pressures -GDMT limited due to hypotension throughout admission; will add as able -Monitor I/Os and daily weights  #Newly Diagnosed Aflutter with RVR: CHADs-vasc 5-6. Newly diagnosed on this admission.  Was initially on dilt gtt but became hypotensive and then transitioned to amiodarone (but reportedly became more confused) and ultimately placed on metoprolol. Converted back to NSR this on 01/07/22 and remains in rhythm today. BP soft yesterday requiring IVF administration. Will hold metop for now and monitor. -On heparin gtt while awaiting cath -Hold metop given soft blood pressures yesterday   #Hyponatremia: Improved overnight to  133 with holding diuresis and administering IVF. Will continue to hold today and monitor. Planned for RHC to assess volume status today.  #NSTEMI: Trop [id number]. Likely demand in the setting of acute systolic HF exacerbation. Planning for ischemic work-up today given drop in EF pending clinical stability. -Plan for RHC/LHC today  -Continue ASA '81mg'$  daily, continue lipitor '20mg'$  daily -Will hold metop for now given soft blood pressures  Plan discussed with the patient and her son at bedside.   INFORMED CONSENT: I have reviewed the risks, indications, and alternatives to cardiac catheterization, possible angioplasty, and stenting with the patient. Risks include but are not limited to bleeding, infection, vascular injury, stroke, myocardial infection, arrhythmia, kidney injury, radiation-related injury in the case of prolonged fluoroscopy use, emergency cardiac surgery, and death. The patient understands the risks of serious complication is 1-2 in 7939 with diagnostic cardiac cath and 1-2% or less with angioplasty/stenting.        For questions or updates, please contact [place] Please consult [email or web address] for contact info under        Signed, Freada Bergeron, MD  01/09/2022, 7:49 AM

## 2022-01-09 NOTE — Progress Notes (Signed)
ANTICOAGULATION CONSULT NOTE   Pharmacy Consult for heparin  Indication: chest pain/ACS and atrial fibrillation  No Known Allergies  Patient Measurements: Height: '5\' 4"'$  (162.6 cm) Weight: 64.4 kg (141 lb 15.6 oz) IBW/kg (Calculated) : 54.7 Heparin Dosing Weight: 61 kg  Vital Signs: Temp: 97.9 F (36.6 C) (09/04 2030) Temp Source: Oral (09/04 2030) BP: 104/53 (09/04 2030) Pulse Rate: 59 (09/04 2030)  Labs: Recent Labs    01/06/22 0159 01/07/22 0140 01/07/22 1057 01/08/22 0408 01/08/22 1335 01/08/22 2326  HGB 13.3 12.5  --  11.9* 11.7*  --   HCT 37.0 33.8*  --  33.0* 32.6*  --   PLT 308 276  --  272 274  --   HEPARINUNFRC 0.32 0.26*   < > <0.10* 0.29* 0.33  CREATININE 0.85 0.88  --  0.89  --   --    < > = values in this interval not displayed.     Estimated Creatinine Clearance: 37.7 mL/min (by C-G formula based on SCr of 0.89 mg/dL).   Medical History: Past Medical History:  Diagnosis Date   Abnormal ECG    a. Pt aware of long hx of abnl ecg->h/o normal stress testing in [place] ~ 2000.   Diastolic dysfunction    a. 04/2013 Echo: EF 60-65%, No rwma, Gr 1 DD, mild MR.   GERD (gastroesophageal reflux disease)    Hypertension    Hypothyroidism     Medications:  Scheduled:   aspirin  81 mg Oral Pre-Cath   aspirin EC  81 mg Oral Daily   atorvastatin  20 mg Oral Daily   metoprolol tartrate  12.5 mg Oral BID   polyethylene glycol  17 g Oral Daily   sodium chloride flush  3 mL Intravenous Q12H    Assessment: 88 yo F with PMH HFpEF presents with trouble breathing. Pt does not take anticoagulants at home according to completed med history or recent fill history. Plan for diagnostic cath on 9/5. Pharmacy is consulted to dose heparin with indication of afib + ACS.   Heparin level therapeutic: 0.33 no infusion issues or overt s/sx of bleeding reported.  Goal of Therapy:  Heparin level 0.3-0.7 units/ml Monitor platelets by anticoagulation protocol: Yes    Plan:  Continue heparin gtt at 1000 units/hr. Check 8hr heparin level  Monitor CBC, Heparin level, s/sx bleeding daily.  Georga Bora, PharmD Clinical Pharmacist 01/09/2022 12:12 AM Please check AMION for all [place] numbers

## 2022-01-09 NOTE — TOC Initial Note (Signed)
Transition of Care [facility]) - Initial/Assessment Note    Patient Details  Name: Ellen Chandler MRN: [id number] Date of Birth: Apr 06, 1931  Transition of Care [facility]) CM/SW Contact:    Vinie Sill, LCSW Phone Number: 01/09/2022, 3:51 PM  Clinical Narrative:                  CSW spoke with patient's son,Brad. CSW introduced self and explained role. Patient's son confirmed patient comes from [facility]([place]). He is somewhat aware of the recommendation for short term rehab at [facility]. However, before any decision is made, he requested to wait until after patient's  procedure today. He states patient has never in short term rehab. He states patient's daughter,Martha should be main contact (called received no answer).   TOC will continue to follow and assist with discharge planning.   Thurmond Butts, MSW, LCSW Clinical Social Worker    Expected Discharge Plan: Skilled Nursing Facility Barriers to Discharge: Continued Medical Work up, SNF Pending bed offer   Patient Goals and CMS Choice Patient states their goals for this hospitalization and ongoing recovery are:: to go rehab CMS Medicare.gov Compare Post Acute Care list provided to:: Patient Choice offered to / list presented to : Patient  Expected Discharge Plan and Services Expected Discharge Plan: [place] In-house Referral: Clinical Social Work     Living arrangements for the past 2 months: [place]                                      Prior Living Arrangements/Services Living arrangements for the past 2 months: [place] Lives with:: Facility Resident Patient language and need for interpreter reviewed:: Yes Do you feel safe going back to the place where you live?: Yes      Need for Family Participation in Patient Care: Yes (Comment) Care giver support system in place?: Yes (comment)   Criminal Activity/Legal Involvement Pertinent to Current  Situation/Hospitalization: No - Comment as needed  Activities of Daily Living      Permission Sought/Granted Permission sought to share information with : Family Supports Permission granted to share information with : Yes, Verbal Permission Granted     Permission granted to share info w AGENCY: SNF        Emotional Assessment Appearance:: Appears stated age Attitude/Demeanor/Rapport: Engaged Affect (typically observed): Appropriate Orientation: : Oriented to Self, Oriented to Place, Oriented to  Time, Oriented to Situation Alcohol / Substance Use: Not Applicable Psych Involvement: No (comment)  Admission diagnosis:  Acute respiratory failure with hypoxia ([place]) [J96.01] Community acquired pneumonia of left lower lobe of lung [J18.9] Patient Active Problem List   Diagnosis Date Noted   Non-ST elevation (NSTEMI) myocardial infarction ([place]) 01/05/2022   History of ETOH abuse 01/04/2022   Chronic diastolic CHF (congestive heart failure) ([place]) 01/04/2022   Acute respiratory failure with hypoxia ([place]) 01/04/2022   Chest pain 06/17/2016   UTI (urinary tract infection) 06/17/2016   Nonspecific abnormal electrocardiogram (ECG) (EKG) 05/06/2013   Acute bronchitis 05/06/2013   Essential hypertension 67/89/3810   Acute diastolic CHF (congestive heart failure) ([place]) 05/06/2013   Dehydration 05/05/2013   CAP (community acquired pneumonia) 05/05/2013   Orthostatic hypotension 05/05/2013   Hyponatremia 05/05/2013   PCP:  Deland Pretty, MD Pharmacy:   [id number] [place] [id number]  Phone: [phone] Fax: [phone]  [facility] [place] [id number] [place], [place] - [address] [place] [address] Phone: [phone] Fax: [phone]     Social Determinants of Health ([place]) Interventions    Readmission Risk Interventions     No data  to display

## 2022-01-09 NOTE — Plan of Care (Signed)
  Problem: Education: Goal: Knowledge of General Education information will improve Description: Including pain rating scale, medication(s)/side effects and non-pharmacologic comfort measures Outcome: Progressing   Problem: Health Behavior/Discharge Planning: Goal: Ability to manage health-related needs will improve Outcome: Progressing   Problem: Clinical Measurements: Goal: Ability to maintain clinical measurements within normal limits will improve Outcome: Progressing Goal: Will remain free from infection Outcome: Progressing Goal: Diagnostic test results will improve Outcome: Progressing Goal: Respiratory complications will improve Outcome: Progressing Goal: Cardiovascular complication will be avoided Outcome: Progressing   Problem: Cardiovascular: Goal: Ability to achieve and maintain adequate cardiovascular perfusion will improve Outcome: Progressing Goal: Vascular access site(s) Level 0-1 will be maintained Outcome: Progressing   Problem: Cardiovascular: Goal: Ability to achieve and maintain adequate cardiovascular perfusion will improve Outcome: Progressing Goal: Vascular access site(s) Level 0-1 will be maintained Outcome: Progressing

## 2022-01-09 NOTE — Evaluation (Signed)
Occupational Therapy Evaluation Patient Details Name: Ellen Chandler MRN: [id number] DOB: 09/29/1930 Today's Date: 01/09/2022   History of Present Illness 88 yo admitted 8/31 with SOB. Pt with pulmonary edema and CHF with demand ischemia. PMhx: HTN, HFpEF, GERD, hypothyroidism, ETOH abuse   Clinical Impression   Pt has made great progress as noted from last Physical Therapy note. Pt was able to complete bed mobility with min guard, sit to stand transfers with elevated surfaces with min guard and was able to complete some unsupported BUE activity with no LOB. Pt required set up for UE ADLS and min to moderate for BLE ADLs. Pt currently with functional limitations due to the deficits listed below (see OT Problem List).  Pt will benefit from skilled OT to increase their safety and independence with ADL and functional mobility for ADL to facilitate discharge to venue listed below.        Recommendations for follow up therapy are one component of a multi-disciplinary discharge planning process, led by the attending physician.  Recommendations may be updated based on patient status, additional functional criteria and insurance authorization.   Follow Up Recommendations  Skilled nursing-short term rehab (<3 hours/day)    Assistance Recommended at Discharge Frequent or constant Supervision/Assistance  Patient can return home with the following A little help with walking and/or transfers;A lot of help with bathing/dressing/bathroom;Assistance with cooking/housework;Assist for transportation;Direct supervision/assist for financial management;Direct supervision/assist for medications management    Functional Status Assessment  Patient has had a recent decline in their functional status and demonstrates the ability to make significant improvements in function in a reasonable and predictable amount of time.  Equipment Recommendations   (TBD at next level but already discussed with pt to have a shower  seat but declined)    Recommendations for Other Services       Precautions / Restrictions Precautions Precautions: Fall Restrictions Weight Bearing Restrictions: No      Mobility Bed Mobility Overal bed mobility: Needs Assistance Bed Mobility: Supine to Sit, Sit to Supine     Supine to sit: Min guard, HOB elevated Sit to supine: Min guard, HOB elevated   General bed mobility comments: Pt has general stiffness but as increase in mobility they were able to have decrease in difficulties    Transfers Overall transfer level: Needs assistance Equipment used: Rolling walker (2 wheels) Transfers: Sit to/from Stand Sit to Stand: Min guard                  Balance Overall balance assessment: Needs assistance Sitting-balance support: Feet supported Sitting balance-Leahy Scale: Good     Standing balance support: Bilateral upper extremity supported, During functional activity Standing balance-Leahy Scale: Fair Standing balance comment: Pt was able to complete BUE reaching with no LOB but limited timefram                           ADL either performed or assessed with clinical judgement   ADL Overall ADL's : Needs assistance/impaired Eating/Feeding: NPO (due to cath today)   Grooming: Wash/dry hands;Wash/dry face;Supervision/safety;Sitting;Standing   Upper Body Bathing: Set up;Sitting   Lower Body Bathing: Moderate assistance;Sit to/from stand Lower Body Bathing Details (indicate cue type and reason): to start LB dressing needs assist but then able to complete after to ankles Upper Body Dressing : Set up;Cueing for safety;Cueing for sequencing;Sitting   Lower Body Dressing: Moderate assistance;Cueing for safety;Cueing for sequencing;Sit to/from stand   Toilet Transfer: Minimal assistance;Cueing  for safety;Cueing for sequencing;Rolling walker (2 wheels);BSC/3in1   Toileting- Clothing Manipulation and Hygiene: Min guard;Cueing for safety;Cueing for  sequencing;Sit to/from stand       Functional mobility during ADLs: Min guard;Rolling walker (2 wheels)       Vision Baseline Vision/History: 1 Wears glasses Patient Visual Report: No change from baseline Vision Assessment?: No apparent visual deficits     Perception     Praxis      Pertinent Vitals/Pain Pain Assessment Pain Assessment: 0-10 Pain Score: 1  Pain Location: due to not having a BM for 5 days reported. Pain Intervention(s): Repositioned     Hand Dominance Right   Extremity/Trunk Assessment Upper Extremity Assessment Upper Extremity Assessment: Generalized weakness;Overall [facility] for tasks assessed   Lower Extremity Assessment Lower Extremity Assessment: Defer to PT evaluation   Cervical / Trunk Assessment Cervical / Trunk Assessment: Kyphotic   Communication Communication Communication: No difficulties   Cognition Arousal/Alertness: Awake/alert Behavior During Therapy: WFL for tasks assessed/performed Overall Cognitive Status: Within Functional Limits for tasks assessed                         Following Commands: Follows multi-step commands with increased time             General Comments       Exercises     Shoulder Instructions      Home Living Family/patient expects to be discharged to:: Private residence Living Arrangements: Alone Available Help at Discharge: Family Type of Home: House       Home Layout: One level     Bathroom Shower/Tub: Occupational psychologist: Standard Bathroom Accessibility: Yes   Home Equipment: Conservation officer, nature (2 wheels);Rollator (4 wheels);BSC/3in1;Grab bars - tub/shower          Prior Functioning/Environment Prior Level of Function : Independent/Modified Independent             Mobility Comments: Pt reports they were very active using 4[employer] and they day they went into ED they were goign out for lunch          OT Problem List: Decreased strength;Decreased activity  tolerance;Impaired balance (sitting and/or standing);Decreased safety awareness;Decreased knowledge of use of DME or AE;Cardiopulmonary status limiting activity;Pain      OT Treatment/Interventions: Self-care/ADL training;Therapeutic exercise;Energy conservation;DME and/or AE instruction;Therapeutic activities;Patient/family education;Balance training    OT Goals(Current goals can be found in the care plan section) Acute Rehab OT Goals Patient Stated Goal: to get home to my own bed OT Goal Formulation: With patient Time For Goal Achievement: 01/23/22 Potential to Achieve Goals: Good  OT Frequency: Min 2X/week    Co-evaluation              AM-PAC OT "6 Clicks" Daily Activity     Outcome Measure Help from another person eating meals?: Total Help from another person taking care of personal grooming?: None Help from another person toileting, which includes using toliet, bedpan, or urinal?: A Little Help from another person bathing (including washing, rinsing, drying)?: A Lot Help from another person to put on and taking off regular upper body clothing?: A Little Help from another person to put on and taking off regular lower body clothing?: A Lot 6 Click Score: 15   End of Session Equipment Utilized During Treatment: Gait belt;Rolling walker (2 wheels) Nurse Communication: Mobility status  Activity Tolerance: Patient tolerated treatment well Patient left: in bed;with call bell/phone within reach;with bed alarm set (MD present)  OT Visit Diagnosis: Unsteadiness on feet (R26.81);Other abnormalities of gait and mobility (R26.89);Muscle weakness (generalized) (M62.81);Pain Pain - part of body:  (due to constipation)                Time: 6195-0932 OT Time Calculation (min): 40 min Charges:  OT General Charges $OT Visit: 1 Visit OT Evaluation $OT Eval Low Complexity: 1 Low OT Treatments $Self Care/Home Management : 23-37 mins  Joeseph Amor OTR/L  Acute Rehab Services   [phone] office number [phone] pager number   Joeseph Amor 01/09/2022, 8:35 AM

## 2022-01-09 NOTE — H&P (View-Only) (Signed)
Rounding Note    Patient Name: Ellen Chandler Date of Encounter: 01/09/2022  [place] Cardiologist: Mertie Moores, MD   Subjective   Feels much better this morning. Weaned off oxygen. No chest pain or SOB. Mental status significantly improved.   Na improved to 133 this AM with holding diuresis and giving IVF  Planned for RHC/LHC today   Inpatient Medications    Scheduled Meds:  aspirin EC  81 mg Oral Daily   atorvastatin  20 mg Oral Daily   metoprolol tartrate  12.5 mg Oral BID   polyethylene glycol  17 g Oral Daily   sodium chloride flush  3 mL Intravenous Q12H   Continuous Infusions:  sodium chloride     sodium chloride     heparin 1,000 Units/hr (01/09/22 0650)   PRN Meds: sodium chloride, acetaminophen **OR** acetaminophen, alum & mag hydroxide-simeth, alum & mag hydroxide-simeth, levalbuterol, ondansetron **OR** ondansetron (ZOFRAN) IV, sodium chloride flush   Vital Signs    Vitals:   01/08/22 1603 01/08/22 2030 01/09/22 0034 01/09/22 0449  BP: 100/63 (!) 104/53 113/60 94/76  Pulse: 60 (!) 59 (!) 58 (!) 54  Resp: '17 19 16 18  '$ Temp: (!) 97.5 F (36.4 C) 97.9 F (36.6 C) 97.6 F (36.4 C) (!) 97.5 F (36.4 C)  TempSrc: Oral Oral Oral Oral  SpO2: 96% 96% 98% 95%  Weight:    64.4 kg  Height:        Intake/Output Summary (Last 24 hours) at 01/09/2022 0749 Last data filed at 01/09/2022 0531 Gross per 24 hour  Intake 937.12 ml  Output 1700 ml  Net -762.88 ml       01/09/2022    4:49 AM 01/08/2022    5:29 AM 01/07/2022    4:17 AM  Last 3 Weights  Weight (lbs) 142 lb 141 lb 15.6 oz 141 lb 8.6 oz  Weight (kg) 64.411 kg 64.4 kg 64.2 kg      Telemetry    NSR with frequent PVCs - Personally Reviewed  ECG    No new tracing - Personally Reviewed  Physical Exam   GEN: Elderly female, comfortable, laying in bed Neck: No JVD Cardiac: RRR, no murmurs, rubs, or gallops.  Respiratory: Bibasilar crackles (L>R) GI: Soft, nontender,  non-distended  MS: No edema; No deformity. Neuro:  Alert and awake, answering questions appropriately Psych: Normal affect   Labs    High Sensitivity Troponin:   Recent Labs  Lab 01/04/22 0416 01/04/22 0932 01/04/22 1145  TROPONINIHS 123* 128* 102*      Chemistry Recent Labs  Lab 01/05/22 0434 01/06/22 0159 01/07/22 0140 01/08/22 0408 01/09/22 0221  NA 136 130* 127* 126* 133*  K 4.0 3.6 3.3* 3.3* 3.6  CL 99 91* 89* 89* 97*  CO2 '28 28 27 28 29  '$ GLUCOSE 95 122* 87 96 97  BUN '21 20 21 '$ 28* 24*  CREATININE 1.03* 0.85 0.88 0.89 0.88  CALCIUM 9.4 9.6 8.7* 8.4* 8.2*  MG 2.0  --  1.8  --   --   PROT 5.7* 6.4* 5.7*  --   --   ALBUMIN 3.1* 3.6 3.2*  --   --   AST '19 21 24  '$ --   --   ALT '22 22 18  '$ --   --   ALKPHOS 30* 35* 31*  --   --   BILITOT 0.8 0.7 0.7  --   --   GFRNONAA 52* >60 >60 >60 >60  ANIONGAP 9  $'11 11 9 7     'R$ Lipids  Recent Labs  Lab 01/05/22 0434  CHOL 121  TRIG 56  HDL 40*  LDLCALC 70  CHOLHDL 3.0     Hematology Recent Labs  Lab 01/08/22 0408 01/08/22 1335 01/09/22 0221  WBC 8.9 8.0 7.8  RBC 3.52* 3.51* 3.37*  HGB 11.9* 11.7* 11.3*  HCT 33.0* 32.6* 32.4*  MCV 93.8 92.9 96.1  MCH 33.8 33.3 33.5  MCHC 36.1* 35.9 34.9  RDW 11.7 11.6 11.6  PLT 272 274 258    Thyroid No results for input(s): "TSH", "FREET4" in the last 168 hours.  BNP Recent Labs  Lab 01/04/22 0006  BNP 519.6*     DDimer No results for input(s): "DDIMER" in the last 168 hours.   Radiology    No results found.  Cardiac Studies   01/04/22: IMPRESSIONS   1. Left ventricular ejection fraction, by estimation, is 40 to 45%. The  left ventricle has mildly decreased function. The left ventricle has no  regional wall motion abnormalities. Left ventricular diastolic parameters  are consistent with Grade I  diastolic dysfunction (impaired relaxation). Elevated left ventricular  end-diastolic pressure.   2. Right ventricular systolic function is normal. The right  ventricular  size is normal. There is normal pulmonary artery systolic pressure.   3. Left atrial size was moderately dilated.   4. The mitral valve is normal in structure. Trivial mitral valve  regurgitation. No evidence of mitral stenosis.   5. The aortic valve is tricuspid. There is mild calcification of the  aortic valve. There is mild thickening of the aortic valve. Aortic valve  regurgitation is trivial. No aortic stenosis is present.   6. The inferior vena cava is normal in size with greater than 50%  respiratory variability, suggesting right atrial pressure of 3 mmHg.   Patient Profile     88 y.o. female with history of HTN, HFpEF, GERD, and hypothyroidism who presented with worsening dyspnea found to have acute systolic HF exacerbation with LVEF 40-45% on TTE (from 60-65% in 2014) with course complicated by new Aflutter with RVR. Cardiology is consulted for further management of acute systolic HF and new onset Aflutter with RVR.   Assessment & Plan    #Acute Systolic HF: Patient presented with worsening dyspnea found to have BNP 519, pulmonary edema on chest imaging, and new drop in EF to 40-45% consistent with acute systolic HF exacerbation. Was scheduled for cath as part of ischemic work-up but this was cancelled due to patient lethargy. Given improvement in mental status, her procedure has been rescheduled for today. Overall volume status improved since admission and O2 requirement has been weaned. Holding diuresis for now given worsening hyponatremia. -Plan for RHC/LHC today; discussed with patient and son at bedside and they are amenable to proceed -Hold diuresis for now given worsening hyponatremia with diuretics; improved overnight with IVF -Hold metop for now given soft blood pressures -GDMT limited due to hypotension throughout admission; will add as able -Monitor I/Os and daily weights  #Newly Diagnosed Aflutter with RVR: CHADs-vasc 5-6. Newly diagnosed on this admission.  Was initially on dilt gtt but became hypotensive and then transitioned to amiodarone (but reportedly became more confused) and ultimately placed on metoprolol. Converted back to NSR this on 01/07/22 and remains in rhythm today. BP soft yesterday requiring IVF administration. Will hold metop for now and monitor. -On heparin gtt while awaiting cath -Hold metop given soft blood pressures yesterday   #Hyponatremia: Improved overnight to  133 with holding diuresis and administering IVF. Will continue to hold today and monitor. Planned for RHC to assess volume status today.  #NSTEMI: Trop [id number]. Likely demand in the setting of acute systolic HF exacerbation. Planning for ischemic work-up today given drop in EF pending clinical stability. -Plan for RHC/LHC today  -Continue ASA '81mg'$  daily, continue lipitor '20mg'$  daily -Will hold metop for now given soft blood pressures  Plan discussed with the patient and her son at bedside.   INFORMED CONSENT: I have reviewed the risks, indications, and alternatives to cardiac catheterization, possible angioplasty, and stenting with the patient. Risks include but are not limited to bleeding, infection, vascular injury, stroke, myocardial infection, arrhythmia, kidney injury, radiation-related injury in the case of prolonged fluoroscopy use, emergency cardiac surgery, and death. The patient understands the risks of serious complication is 1-2 in 0076 with diagnostic cardiac cath and 1-2% or less with angioplasty/stenting.        For questions or updates, please contact [place] Please consult [email or web address] for contact info under        Signed, Freada Bergeron, MD  01/09/2022, 7:49 AM

## 2022-01-09 NOTE — Progress Notes (Signed)
PROGRESS NOTE   Ellen Chandler  DPO:[id number] DOB: 1931-01-22 DOA: 01/03/2022 PCP: Deland Pretty, MD  Brief Narrative:  23 Y female independent living facility resident HFpEF no CAD HTN with complications of hypotension in the past HLD hypothyroid reflux DM TY 2 Known ethanol habituation in the past   Was talking on daughter over the phone and patient became short of breath associated with chest pain placed on NIPPV  BNP noted to be elevated, troponin 123 CXR suggestive of PNA but on my over read not convinced there does seem to be some increased interstitial opacities on the right side BUNs/creatinine 29/1.03--> 27/0.89   COVID is negative--EKG showed PVCs with ST inversions Patient subsequently flipped into A-fib RVR  Cardiology consulted secondary to elevated troponin A-fib RVR and discussed cath see below  Developed volume depletion and symptomatic hyponatremia-now resolving  Cardiac cath to be done 9/5  Hospital-Problem based course  Somnolence, metabolic encephalopathy on 9/1 Now resolved-secondary to poor sleep   NSTEMI, troponin elevation, T wave changes-EF 40-45% with decreased function but no wall motion abnormality poor CABG candidate--defer cath timing to cardiology Hypotension limiting GDMT -- See below continue ASA 81, heparin GTT on board still-defer to cardiology  Acute hyponatremia likely secondary to tea toast potomania Sodium dropping to the 126 range from 138 probably drinking more than eating-was given IV fluid bolus and then IV rate overnight 9/4 which resolved the hyponatremia and the fluids have been stopped now Urine studies ordered ?  No result--would not follow given resolution Would increase fluid intake to 1500 cc in next 24 hours   Patient new onset A-fib/flutter RVR CHADVASC >3 has bled score less than 2 Hypotensive when placed on Cardizem drip 8/31-encephalopathic on [place] (this is not a known side effect)metoprolol d/c 2/2 hypotension On  NO rate control at this time--defer to cardiology  Acute decompensated HFrEF EF 40 to 50% Required transient BiPAP in emergency room Diuresis as per cardiology,  Lasix held since 9/4--could dose every other day 40 mg?  As per cardiology-, is- 4.2 L so far, weights are inaccurate  Impaired glucose tolerance A1c is probably less than 6.2 based on bedside CBG readings-cancel A1c  Prior ethanol habituation- No real signs or symptoms of DTs at this time-okay to discontinue  DVT prophylaxis: Heparin GTT Code Status: Full Family Communication: Patient at the bedside with Brad at [place]  [phone] on 01/08/2022 No family present at time of my evaluation Disposition:  Status is: Inpatient Remains inpatient appropriate because:   Awaiting cardiac cath and will need therapy eval postprocedure as is at independent living Needs further adjustment medications for  A-fib, acute systolic heart failure as well has NSTEMI   Consultants:  Cardiology  Procedures:   Antimicrobials: None   Subjective:  Pleasant coherent no distress Hungry Going for cath this morning She is very alert and oriented today No chest pain no fever no chills no nausea no vomiting She does not seem swollen or short of breath    Objective: Vitals:   01/09/22 0449 01/09/22 0800 01/09/22 0832 01/09/22 0900  BP: 94/76 (!) 130/58 (!) 130/58 (!) 127/59  Pulse: (!) 54 (!) 56 (!) 59 80  Resp: '18 17 19 17  '$ Temp: (!) 97.5 F (36.4 C) 97.6 F (36.4 C) 97.6 F (36.4 C)   TempSrc: Oral Oral Oral   SpO2: 95% 96% 94% 96%  Weight: 64.4 kg     Height:        Intake/Output Summary (Last  24 hours) at 01/09/2022 0957 Last data filed at 01/09/2022 0531 Gross per 24 hour  Intake 937.12 ml  Output 1700 ml  Net -762.88 ml    Filed Weights   01/07/22 0417 01/08/22 0529 01/09/22 0449  Weight: 64.2 kg 64.4 kg 64.4 kg    Examination:  Coherent alert, feels much better no icterus no pallor Chest clear no  rales no rhonchi no JVD no bruits S1-S2, intermittently in A-fib predominantly sinus on monitors Abdomen soft no rebound no guarding ROM intact no focal deficit moving 4 limbs  Data Reviewed: personally reviewed   CBC    Component Value Date/Time   WBC 7.8 01/09/2022 0221   RBC 3.37 (L) 01/09/2022 0221   HGB 11.3 (L) 01/09/2022 0221   HCT 32.4 (L) 01/09/2022 0221   PLT 258 01/09/2022 0221   MCV 96.1 01/09/2022 0221   MCH 33.5 01/09/2022 0221   MCHC 34.9 01/09/2022 0221   RDW 11.6 01/09/2022 0221   LYMPHSABS 1.5 06/17/2021 1117   MONOABS 0.6 06/17/2021 1117   EOSABS 0.1 06/17/2021 1117   BASOSABS 0.0 06/17/2021 1117      Latest Ref Rng & Units 01/09/2022    2:21 AM 01/08/2022    4:08 AM 01/07/2022    1:40 AM  CMP  Glucose 70 - 99 mg/dL 97  96  87   BUN 8 - 23 mg/dL '24  28  21   '$ Creatinine 0.44 - 1.00 mg/dL 0.88  0.89  0.88   Sodium 135 - 145 mmol/L 133  126  127   Potassium 3.5 - 5.1 mmol/L 3.6  3.3  3.3   Chloride 98 - 111 mmol/L 97  89  89   CO2 22 - 32 mmol/L '29  28  27   '$ Calcium 8.9 - 10.3 mg/dL 8.2  8.4  8.7   Total Protein 6.5 - 8.1 g/dL   5.7   Total Bilirubin 0.3 - 1.2 mg/dL   0.7   Alkaline Phos 38 - 126 U/L   31   AST 15 - 41 U/L   24   ALT 0 - 44 U/L   18      Radiology Studies: No results found.   Scheduled Meds:  aspirin EC  81 mg Oral Daily   atorvastatin  20 mg Oral Daily   polyethylene glycol  17 g Oral Daily   sodium chloride flush  3 mL Intravenous Q12H   sodium chloride flush  3 mL Intravenous Q12H   Continuous Infusions:  sodium chloride     sodium chloride     heparin 1,000 Units/hr (01/09/22 0650)     LOS: 5 days   Time spent: [place], MD [facility] To contact the attending provider between 7A-7P or the covering provider during after hours 7P-7A, please log into the web site [email or web address] and access using universal [place] password for that web site. If you do not have the password, please call the  hospital operator.  01/09/2022, 9:57 AM

## 2022-01-09 NOTE — Interval H&P Note (Signed)
History and Physical Interval Note:  01/09/2022 3:37 PM  Ellen Chandler  has presented today for surgery, with the diagnosis of NSTEMI.  The various methods of treatment have been discussed with the patient and family. After consideration of risks, benefits and other options for treatment, the patient has consented to  Procedure(s): RIGHT/LEFT HEART CATH AND CORONARY ANGIOGRAPHY (N/A) as a surgical intervention.  The patient's history has been reviewed, patient examined, no change in status, stable for surgery.  I have reviewed the patient's chart and labs.  Questions were answered to the patient's satisfaction.     Early Osmond

## 2022-01-09 NOTE — Progress Notes (Signed)
Physical Therapy Treatment Patient Details Name: Ellen Chandler MRN: [id number] DOB: 01-May-1931 Today's Date: 01/09/2022   History of Present Illness 88 yo admitted 8/31 with SOB. Pt with pulmonary edema and CHF with demand ischemia. PMhx: HTN, HFpEF, GERD, hypothyroidism, ETOH abuse    PT Comments    Pt received supine and agreeable to session with great progress towards acute goals. Session focused on progression of functional mobility for increased activity tolerance, independence, and safety. Pt able to complete all bed mobility with min guard and transfers with up to light min assist to stand from low recliner and to manage RW with step pivoting to recliner. Pt able to demonstrate 2 bouts of ambulation this session with seated recovery between bouts with no noted LOB, pt requiring with RW for stability and min guard for safety. Pt continues to benefit from skilled PT services to progress toward functional mobility goals.    Recommendations for follow up therapy are one component of a multi-disciplinary discharge planning process, led by the attending physician.  Recommendations may be updated based on patient status, additional functional criteria and insurance authorization.  Follow Up Recommendations  Skilled nursing-short term rehab (<3 hours/day) Can patient physically be transported by private vehicle: No   Assistance Recommended at Discharge Frequent or constant Supervision/Assistance  Patient can return home with the following A lot of help with walking and/or transfers;A lot of help with bathing/dressing/bathroom;Assistance with cooking/housework;Direct supervision/assist for financial management;Assist for transportation;Direct supervision/assist for medications management   Equipment Recommendations  None recommended by PT    Recommendations for Other Services       Precautions / Restrictions Precautions Precautions: Fall Restrictions Weight Bearing Restrictions: No      Mobility  Bed Mobility Overal bed mobility: Needs Assistance Bed Mobility: Supine to Sit     Supine to sit: Min guard, HOB elevated     General bed mobility comments: Pt has general stiffness but as increase in mobility decrease in difficulty    Transfers Overall transfer level: Needs assistance Equipment used: Rolling walker (2 wheels) Transfers: Sit to/from Stand Sit to Stand: Min guard, Min assist   Step pivot transfers: Min assist       General transfer comment: light min assist to stand from low recliner, and min assist for turning with RW and lines/leads, min guard to stand from elevate EOB    Ambulation/Gait Ambulation/Gait assistance: Min guard, Min assist Gait Distance (Feet): 20 Feet (x2) Assistive device: Rolling walker (2 wheels) Gait Pattern/deviations: Step-through pattern, Decreased stride length, Trunk flexed Gait velocity: decr     General Gait Details: slow mildy unsteady gait with RW in room with seated recovery betweek bouts, min assist to manage RW during turning.   Stairs             Wheelchair Mobility    Modified Rankin (Stroke Patients Only)       Balance Overall balance assessment: Needs assistance Sitting-balance support: Feet supported Sitting balance-Leahy Scale: Good     Standing balance support: Bilateral upper extremity supported, During functional activity Standing balance-Leahy Scale: Fair Standing balance comment: Pt was able to complete BUE reaching with no LOB but limited timefram                            Cognition Arousal/Alertness: Awake/alert Behavior During Therapy: WFL for tasks assessed/performed Overall Cognitive Status: Within Functional Limits for tasks assessed  Following Commands: Follows multi-step commands with increased time                Exercises      General Comments        Pertinent Vitals/Pain Pain Assessment Pain Assessment:  Faces Faces Pain Scale: No hurt Pain Intervention(s): Monitored during session    Home Living                          Prior Function            PT Goals (current goals can now be found in the care plan section) Acute Rehab PT Goals PT Goal Formulation: With patient Time For Goal Achievement: 01/20/22    Frequency    Min 3X/week      PT Plan      Co-evaluation              AM-PAC PT "6 Clicks" Mobility   Outcome Measure  Help needed turning from your back to your side while in a flat bed without using bedrails?: A Little Help needed moving from lying on your back to sitting on the side of a flat bed without using bedrails?: A Little Help needed moving to and from a bed to a chair (including a wheelchair)?: A Little Help needed standing up from a chair using your arms (e.g., wheelchair or bedside chair)?: A Little Help needed to walk in hospital room?: A Little Help needed climbing 3-5 steps with a railing? : Total 6 Click Score: 16    End of Session Equipment Utilized During Treatment: Gait belt Activity Tolerance: Patient tolerated treatment well Patient left: in chair;with call bell/phone within reach;with nursing/sitter in room;with chair alarm set Nurse Communication: Mobility status PT Visit Diagnosis: Other abnormalities of gait and mobility (R26.89);Difficulty in walking, not elsewhere classified (R26.2);Muscle weakness (generalized) (M62.81)     Time: 8242-3536 PT Time Calculation (min) (ACUTE ONLY): 19 min  Charges:  $Gait Training: 8-22 mins                     Tabbitha Janvrin R. PTA Acute Rehabilitation Services Office: [place] 01/09/2022, 12:15 PM

## 2022-01-10 ENCOUNTER — Inpatient Hospital Stay (HOSPITAL_COMMUNITY): Payer: PPO

## 2022-01-10 ENCOUNTER — Encounter (HOSPITAL_COMMUNITY): Payer: Self-pay | Admitting: Internal Medicine

## 2022-01-10 ENCOUNTER — Telehealth (HOSPITAL_COMMUNITY): Payer: Self-pay | Admitting: Pharmacy Technician

## 2022-01-10 ENCOUNTER — Other Ambulatory Visit (HOSPITAL_COMMUNITY): Payer: Self-pay

## 2022-01-10 DIAGNOSIS — I214 Non-ST elevation (NSTEMI) myocardial infarction: Secondary | ICD-10-CM | POA: Diagnosis not present

## 2022-01-10 DIAGNOSIS — J9601 Acute respiratory failure with hypoxia: Secondary | ICD-10-CM | POA: Diagnosis not present

## 2022-01-10 DIAGNOSIS — I5031 Acute diastolic (congestive) heart failure: Secondary | ICD-10-CM | POA: Diagnosis not present

## 2022-01-10 DIAGNOSIS — I5032 Chronic diastolic (congestive) heart failure: Secondary | ICD-10-CM | POA: Diagnosis not present

## 2022-01-10 DIAGNOSIS — I10 Essential (primary) hypertension: Secondary | ICD-10-CM | POA: Diagnosis not present

## 2022-01-10 LAB — CBC
HCT: 33.8 % — ABNORMAL LOW (ref 36.0–46.0)
Hemoglobin: 12.1 g/dL (ref 12.0–15.0)
MCH: 34.5 pg — ABNORMAL HIGH (ref 26.0–34.0)
MCHC: 35.8 g/dL (ref 30.0–36.0)
MCV: 96.3 fL (ref 80.0–100.0)
Platelets: 272 10*3/uL (ref 150–400)
RBC: 3.51 MIL/uL — ABNORMAL LOW (ref 3.87–5.11)
RDW: 11.8 % (ref 11.5–15.5)
WBC: 6.6 10*3/uL (ref 4.0–10.5)
nRBC: 0 % (ref 0.0–0.2)

## 2022-01-10 LAB — BASIC METABOLIC PANEL
Anion gap: 8 (ref 5–15)
BUN: 18 mg/dL (ref 8–23)
CO2: 25 mmol/L (ref 22–32)
Calcium: 8.2 mg/dL — ABNORMAL LOW (ref 8.9–10.3)
Chloride: 100 mmol/L (ref 98–111)
Creatinine, Ser: 0.79 mg/dL (ref 0.44–1.00)
GFR, Estimated: >60 mL/min (ref >60)
Glucose, Bld: 96 mg/dL (ref 70–99)
Potassium: 4 mmol/L (ref 3.5–5.1)
Sodium: 133 mmol/L — ABNORMAL LOW (ref 135–145)

## 2022-01-10 LAB — HEPARIN LEVEL (UNFRACTIONATED): Heparin Unfractionated: 0.22 IU/mL — ABNORMAL LOW (ref 0.30–0.70)

## 2022-01-10 MED ORDER — APIXABAN 5 MG PO TABS
5.0000 mg | ORAL_TABLET | Freq: Two times a day (BID) | ORAL | Status: DC
  Administered 2022-01-10 – 2022-01-12 (×5): 5 mg via ORAL
  Filled 2022-01-10 (×5): qty 1

## 2022-01-10 MED ORDER — MAGNESIUM HYDROXIDE 400 MG/5ML PO SUSP
30.0000 mL | Freq: Every day | ORAL | Status: DC | PRN
  Filled 2022-01-10: qty 30

## 2022-01-10 MED ORDER — METOPROLOL TARTRATE 12.5 MG HALF TABLET
12.5000 mg | ORAL_TABLET | Freq: Two times a day (BID) | ORAL | Status: DC
  Administered 2022-01-10 – 2022-01-12 (×4): 12.5 mg via ORAL
  Filled 2022-01-10 (×5): qty 1

## 2022-01-10 NOTE — Discharge Instructions (Signed)

## 2022-01-10 NOTE — TOC Progression Note (Signed)
Transition of Care [facility]) - Progression Note    Patient Details  Name: Ellen Chandler MRN: [id number] Date of Birth: 08-27-1930  Transition of Care [facility]) CM/SW [place], [place] Phone Number: 01/10/2022, 5:25 PM  Clinical Narrative:      CSW called patient daughter, Jana Half, received no answer  CSW spoke with patient's son, [name] and his spouse(family). CSW informed per MD patient is ready to transition to next venue. CSW again informed of PT/OT recommendations., shot term rehab at [facility]. Family reports they believe it is best for the patient to get stronger before returning home. They are agreeable to rehab at SNF but expressed concerns that the  patient will NOT be agreeable. CSW acknowledged the family's concerns and encourage them to begin exploring their options, such as ALF and other resources in the community that may can assist  keeping the patient her home. CSW explained patient will have to agree to short term rehab at [facility] and get insurance approval before in order to discharge to SNF. Family states understanding.   3:46pm- patient care was in process- CSW unable to me with the patient.   TOC will continue to follow and assist with discharge planning.  Thurmond Butts, MSW, LCSW Clinical Social Worker    Expected Discharge Plan: Skilled Nursing Facility Barriers to Discharge: Continued Medical Work up, SNF Pending bed offer  Expected Discharge Plan and Services Expected Discharge Plan: [place] In-house Referral: Clinical Social Work     Living arrangements for the past 2 months: [place]                                       Social Determinants of Health (SDOH) Interventions    Readmission Risk Interventions     No data to display

## 2022-01-10 NOTE — Progress Notes (Signed)
[place] for heparin > apixaban Indication: chest pain/ACS and atrial fibrillation  No Known Allergies  Patient Measurements: Height: '5\' 4"'$  (162.6 cm) Weight: 64.3 kg (141 lb 12.8 oz) IBW/kg (Calculated) : 54.7 Heparin Dosing Weight: 61 kg  Vital Signs: Temp: 98.2 F (36.8 C) (09/06 0815) Temp Source: Oral (09/06 0815) BP: 142/65 (09/06 0815) Pulse Rate: 61 (09/06 0450)  Labs: Recent Labs    01/08/22 0408 01/08/22 1335 01/08/22 2326 01/09/22 0221 01/09/22 1606 01/09/22 1611 01/09/22 1612 01/10/22 0723  HGB 11.9* 11.7*  --  11.3*   < > 12.2 11.9* 12.1  HCT 33.0* 32.6*  --  32.4*   < > 36.0 35.0* 33.8*  PLT 272 274  --  258  --   --   --  272  HEPARINUNFRC <0.10* 0.29* 0.33 0.39  --   --   --  0.22*  CREATININE 0.89  --   --  0.88  --   --   --  0.79   < > = values in this interval not displayed.     Estimated Creatinine Clearance: 42 mL/min (by C-G formula based on SCr of 0.79 mg/dL).   Medical History: Past Medical History:  Diagnosis Date   Abnormal ECG    a. Pt aware of long hx of abnl ecg->h/o normal stress testing in [place] ~ 2000.   Diastolic dysfunction    a. 04/2013 Echo: EF 60-65%, No rwma, Gr 1 DD, mild MR.   GERD (gastroesophageal reflux disease)    Hypertension    Hypothyroidism     Medications:  Scheduled:   apixaban  5 mg Oral BID   metoprolol tartrate  12.5 mg Oral BID   polyethylene glycol  17 g Oral Daily   sodium chloride flush  3 mL Intravenous Q12H   sodium chloride flush  3 mL Intravenous Q12H    Assessment: 88 yo F with PMH HFpEF presents with trouble breathing. Pt does not take anticoagulants at home according to completed med history or recent fill history. Plan for diagnostic cath on 9/5. Pharmacy is consulted to dose heparin with indication of afib + ACS.   Patient now post cath with TR band in place.  Orders received to resume Heparin 2 hours after TR band removed for  indication of atrial fibrillation.  Prior therapeutic on heparin drip rate of 1000 units/hr. CBC stable.  No line issues or signs/symptoms of bleeding reported. TR band removed 20:30 PM.  Heparin resumed 1000 uts/hr heparin level slightly low at 0.22 cbc ok  Plan to change to apixaban wt > 60kg cr < 1.5 age > 80yo  Goal of Therapy:  Heparin level 0.3-0.7 units/ml Monitor platelets by anticoagulation protocol: Yes   Plan:  Stop heparin  Apixaban '5mg'$  BID copay $45    Bonnita Nasuti Pharm.D. CPP, BCPS Clinical Pharmacist [phone] 01/10/2022 11:20 AM   Please refer to AMION for [place] numbers 01/10/2022 11:19 AM

## 2022-01-10 NOTE — Progress Notes (Signed)
  Progress Note   Patient: Ellen Chandler DOB: 04/07/1931 DOA: 01/03/2022     6 DOS: the patient was seen and examined on 01/10/2022   Brief hospital course: The breast  Assessment and Plan: Acute respiratory failure with hypoxia ([place]) DDx = CAP vs COVID vs CHF vs MI. CXR looks like CAP No fever, no WBC. COVID and flu pending BNP elevation + rapid improvement on BIPAP (weaned off of BIPAP to 2L via Eva now) more suspicious for CHF. Sudden onset + CP with activity + EKG findings, concerning for MI. Got rocephin + azithro in ED COVID and flu pending Order COVID pathway (remdesivir + steroids) if COVID positive Procalcitonin pending Order PNA pathway and reorder ABx if procalcitonin positive, or if WBC elevates Consider ordering CHF pathway and diuretics if procalcitonin neg and COVID neg NPO for the moment, at least until we see if she needs to go back on BIPAP or not. CP resolved at the moment, does have ST depressions in lateral leads that appear new / changed since Oct 2022. Check trop Ordering ASA 325 now Order ACS pathway and cards consult if trop elevated. Tele monitor  Chronic diastolic CHF (congestive heart failure) (HCC) Unclear if acute decompensation today causing SOB presentation: CXR looks more like PNA per radiologist though BNP elevated, no fever, no WBC. Tele monitor  History of ETOH abuse Pt with history of EtOH use/abuse, ED visit(s) noted for intoxication within the past 1 yr. Putting on CIWA for withdrawal prevention.  Essential hypertension BP on soft side in ED. Hold amlodipine for the moment.        Subjective: States feeling better today  Physical Exam: Vitals:   01/10/22 0450 01/10/22 0500 01/10/22 0815 01/10/22 1200  BP: 121/63  (!) 142/65 (!) 151/82  Pulse: 61   80  Resp: 19   (!) 25  Temp: 98.6 F (37 C)  98.2 F (36.8 C) 97.8 F (36.6 C)  TempSrc: Oral  Oral Oral  SpO2: 91%   98%  Weight:  64.3 kg    Height:        General exam: Awake, laying in bed, in nad Respiratory system: Normal respiratory effort, no wheezing Cardiovascular system: regular rate, s1, s2 Gastrointestinal system: Soft, nondistended, positive BS Central nervous system: CN2-12 grossly intact, strength intact Extremities: Perfused, no clubbing Skin: Normal skin turgor, no notable skin lesions seen Psychiatry: Mood normal // no visual hallucinations   Data Reviewed:  Labs reviewed: Na 133, K 4.0, Cr 0.79  Family Communication: Pt in room, family not at bedside  Disposition: Status is: Inpatient Remains inpatient appropriate because: Severity of illness  Planned Discharge Destination: Skilled nursing facility    Author: Marylu Lund, MD 01/10/2022 4:33 PM  For on call review [email or web address].

## 2022-01-10 NOTE — Telephone Encounter (Signed)
Pharmacy Patient Advocate Encounter  Insurance verification completed.    The patient is insured through [employer] Medicare Part D   The patient is currently admitted and ran test claims for the following: Eliquis.  Copays and coinsurance results were relayed to Inpatient clinical team.      

## 2022-01-10 NOTE — Progress Notes (Addendum)
Rounding Note    Patient Name: Ellen Chandler Date of Encounter: 01/10/2022  [place] Cardiologist: Mertie Moores, MD   Subjective   No acute overnight events. Patient states her breathings is "so-so" this morning and she also reports feeling very weak. She was able to ambulate with a walker earlier this morning and denies any significant dyspnea with this. No chest pain or palpitations.  Inpatient Medications    Scheduled Meds:  aspirin EC  81 mg Oral Daily   atorvastatin  20 mg Oral Daily   polyethylene glycol  17 g Oral Daily   sodium chloride flush  3 mL Intravenous Q12H   sodium chloride flush  3 mL Intravenous Q12H   Continuous Infusions:  sodium chloride     heparin 1,000 Units/hr (01/09/22 2338)   PRN Meds: sodium chloride, acetaminophen **OR** acetaminophen, alum & mag hydroxide-simeth, levalbuterol, magnesium hydroxide, ondansetron **OR** ondansetron (ZOFRAN) IV, sodium chloride flush   Vital Signs    Vitals:   01/10/22 0000 01/10/22 0450 01/10/22 0500 01/10/22 0815  BP: (!) 143/65 121/63  (!) 142/65  Pulse: 67 61    Resp: 19 19    Temp: 98 F (36.7 C) 98.6 F (37 C)  98.2 F (36.8 C)  TempSrc: Oral Oral  Oral  SpO2: 94% 91%    Weight:   64.3 kg   Height:        Intake/Output Summary (Last 24 hours) at 01/10/2022 1002 Last data filed at 01/10/2022 0619 Gross per 24 hour  Intake 680 ml  Output 1200 ml  Net -520 ml      01/10/2022    5:00 AM 01/09/2022    4:49 AM 01/08/2022    5:29 AM  Last 3 Weights  Weight (lbs) 141 lb 12.8 oz 142 lb 141 lb 15.6 oz  Weight (kg) 64.32 kg 64.411 kg 64.4 kg      Telemetry    Normal sinus rhythm with occasional PVCs. Bigeminy PVCs last night. Resting rates in the 60s with spikes into the 80s (suspect this is with activity). - Personally Reviewed  ECG    No new ECG tracing today. - Personally Reviewed  Physical Exam   GEN: Elderly Caucasian female resting comfortably in no acute distress.   Neck:  No JVD. Cardiac: RRR. No murmurs, rubs, or gallops. Right radial cath site soft with no signs of hematoma. Respiratory: No increased work of breathing. Decreased breath sounds bilaterally with mild crackles noted in bases (mostly left base). GI: Soft, non-distended, and non-tender. MS: No lower extremity edema. No deformity. Skin: Warm and dry. Neuro:  No focal deficits. Psych: Normal affect. Responds appropriately.  Labs    High Sensitivity Troponin:   Recent Labs  Lab 01/04/22 0416 01/04/22 0932 01/04/22 1145  TROPONINIHS 123* 128* 102*     Chemistry Recent Labs  Lab 01/05/22 0434 01/06/22 0159 01/07/22 0140 01/08/22 0408 01/09/22 0221 01/09/22 1606 01/09/22 1611 01/09/22 1612 01/10/22 0723  NA 136 130* 127* 126* 133*   < > 135 135 133*  K 4.0 3.6 3.3* 3.3* 3.6   < > 3.7 3.7 4.0  CL 99 91* 89* 89* 97*  --   --   --  100  CO2 '28 28 27 28 29  '$ --   --   --  25  GLUCOSE 95 122* 87 96 97  --   --   --  96  BUN '21 20 21 '$ 28* 24*  --   --   --  18  CREATININE 1.03* 0.85 0.88 0.89 0.88  --   --   --  0.79  CALCIUM 9.4 9.6 8.7* 8.4* 8.2*  --   --   --  8.2*  MG 2.0  --  1.8  --   --   --   --   --   --   PROT 5.7* 6.4* 5.7*  --   --   --   --   --   --   ALBUMIN 3.1* 3.6 3.2*  --   --   --   --   --   --   AST '19 21 24  '$ --   --   --   --   --   --   ALT '22 22 18  '$ --   --   --   --   --   --   ALKPHOS 30* 35* 31*  --   --   --   --   --   --   BILITOT 0.8 0.7 0.7  --   --   --   --   --   --   GFRNONAA 52* >60 >60 >60 >60  --   --   --  >60  ANIONGAP '9 11 11 9 7  '$ --   --   --  8   < > = values in this interval not displayed.    Lipids  Recent Labs  Lab 01/05/22 0434  CHOL 121  TRIG 56  HDL 40*  LDLCALC 70  CHOLHDL 3.0    Hematology Recent Labs  Lab 01/08/22 1335 01/09/22 0221 01/09/22 1606 01/09/22 1611 01/09/22 1612 01/10/22 0723  WBC 8.0 7.8  --   --   --  6.6  RBC 3.51* 3.37*  --   --   --  3.51*  HGB 11.7* 11.3*   < > 12.2 11.9* 12.1  HCT 32.6* 32.4*    < > 36.0 35.0* 33.8*  MCV 92.9 96.1  --   --   --  96.3  MCH 33.3 33.5  --   --   --  34.5*  MCHC 35.9 34.9  --   --   --  35.8  RDW 11.6 11.6  --   --   --  11.8  PLT 274 258  --   --   --  272   < > = values in this interval not displayed.   Thyroid No results for input(s): "TSH", "FREET4" in the last 168 hours.  BNP Recent Labs  Lab 01/04/22 0006  BNP 519.6*    DDimer No results for input(s): "DDIMER" in the last 168 hours.   Radiology    CARDIAC CATHETERIZATION  Result Date: 01/09/2022 1.  Normal right dominant coronary circulation with no obstructive coronary artery disease. 2.  Mean RA pressure of 1 mmHg, mean wedge pressure of 3 mmHg, cardiac output of 8.5 L/min and cardiac index of 5.1 L/min/m. 3.  LVEDP of 16 mmHg. 4.  Very tortuous right upper extremity that could not be navigated necessitating a femoral approach. Recommendation: Goal-directed medical therapy.    Cardiac Studies   Echocardiogram 01/04/2022: Impressions:  1. Left ventricular ejection fraction, by estimation, is 40 to 45%. The  left ventricle has mildly decreased function. The left ventricle has no  regional wall motion abnormalities. Left ventricular diastolic parameters  are consistent with Grade I  diastolic dysfunction (impaired relaxation). Elevated left ventricular  end-diastolic pressure.   2.  Right ventricular systolic function is normal. The right ventricular  size is normal. There is normal pulmonary artery systolic pressure.   3. Left atrial size was moderately dilated.   4. The mitral valve is normal in structure. Trivial mitral valve  regurgitation. No evidence of mitral stenosis.   5. The aortic valve is tricuspid. There is mild calcification of the  aortic valve. There is mild thickening of the aortic valve. Aortic valve  regurgitation is trivial. No aortic stenosis is present.   6. The inferior vena cava is normal in size with greater than 50%  respiratory variability, suggesting  right atrial pressure of 3 mmHg.  _______________  Right/ Left Cardiac Catheterization 01/09/2022: 1.  Normal right dominant coronary circulation with no obstructive coronary artery disease. 2.  Mean RA pressure of 1 mmHg, mean wedge pressure of 3 mmHg, cardiac output of 8.5 L/min and cardiac index of 5.1 L/min/m. 3.  LVEDP of 16 mmHg. 4.  Very tortuous right upper extremity that could not be navigated necessitating a femoral approach.   Recommendation: Goal-directed medical therapy.  Diagnostic Dominance: Right    Patient Profile     88 y.o. female with a history of HFpEF, hypertension, hypothyroidism, and GERD who was admitted on 01/04/2022 with CHF exacerbation and found to have EF of 40-45% (previously 60-65% in 2014) and new onset atrial flutter with RVR.  Assessment & Plan    Acute Combined CHF Patient presented with worsening dyspnea and was found to have BNP of 519 and pulmonary edema on chest imaging consistent with CHF. Echo showed LVEF 40-45% with no regional wall motion abnormalities and grade 1 diastolic dysfunction. She was initially scheduled for LHC on 01/05/2022 but this was cancelled due to patient's lethargy. Mental status improved and she went for [facility] on 9/5 which showed normal coronaries with normal right sided filling pressures. LVEDP mildly elevated at 16 mmHg. Patient was initially diagnosed with IV Lasix but this was stopped on 01/08/2022 due to worsening hyponatremia. Net negative 5.089 L this admission. Weight up about 7 lbs from admission (don't think this is accurate). Renal function and sodium level stable. - Decreased breath sounds on exam but otherwise appears euvolemic.  - Sodium level improved with holding diuretics and IV fluids. Will discuss PO Lasix with MD. May just need PRN Lasix at discharge. - GDMT limited due to hypotension so fart this admission. BP looks better today so will add back Lopressor 12.'5mg'$  twice daily. If patient's BP tolerates this well,  would transition to Toprol-XL and could then consider adding Losartan. - Continue to monitor volume status closely.  Newly Diagnosed Atrial Flutter with RVR Initially started on IV Cardizem but became hypotensive with this and was transitioned to Amiodarone which reportedly caused her to become more confused. She was ultimately placed on Metoprolol and converted back to sinus rhythm on 9/3. - Maintaining sinus rhythm with resting rates in the 60s. She was having some bigeminy PVCs last night. - Will restart Lopressor 12.'5mg'$  twice daily. - CHA2DS-VASc = 5 (CHF, HTN, age x2, gender). Initially started on IV Heparin in anticipation for cath. Will transition to Eliquis '5mg'$  twice daily now. Will stop Aspirin now that she is on Eliquis.  Demand Ischemia High-sensitivity troponin mildly elevated and flat at 123  >> 128 >> 102. Echo showed LVEF of 40-45% with no regional wall motion abnormalities. Coronaries were clean on cath yesterday. Troponin elevation due to demand ischemia in setting of acute CHF. - Will stop Aspirin now that  patient is on Eliquis.  - Given clean coronaries, think we can also stop her statin. Will discuss with MD.  Hyponatremia Sodium dropped as low as 126 on 9/4. Improved with holding diuresis and giving IV fluids. Sodium stable at 133 today.   For questions or updates, please contact [place] Please consult [email or web address] for contact info under        Signed, Darreld Mclean, PA-C  01/10/2022, 10:02 AM    Patient seen and examined and agree with Sande Rives, PA-C as detailed above.  In brief, the patient is a 88 year old female with history of HFpEF, HTN, hypothyroidism and GERD who presented with worsening dyspnea found to have acute on chronic HF exacerbation with mild drop in EF to 40-45% and new aflutter with RVR for which Cardiology has been consulted.   Underwent RHC/LHC yesterday which showed normal coronaries, normal right sided filling  pressures and mildly elevated LVEDP at 45mHg. She remains in NSR on the monitor. BP improved and will add back metop to see if she tolerates.   Patient states she remains SOB today with diminished air movement on exam and bilateral wheezing which cannot be explained by her underlying HF, given reassuring RHC. Will need further work-up per primary team.  GEN: Elderly female, mildly tachypneic  Neck: No JVD Cardiac: RRR, no murmurs, rubs, or gallops.  Respiratory: Decreased breath sounds with expiratory wheezing.  GI: Soft, nontender, non-distended  MS: No edema; No deformity. Neuro:  Nonfocal  Psych: Normal affect    Plan: -Will check CXR given persistent SOB and wheezing on exam; RHC with only mildly elevated LVEDP and normal right sided pressures and therefore degree of SOB cannot be explained by HF. CTA negative for PE -Given improvement in BP, will resume metop 12.mg BID and monitor -Will add GDMT as able pending blood pressures  -Change back to apixaban now that patient is s/p cath -Hyponatremia improve with holding diuretics; will monitor volume status closely as may need intermittent diuresis going forward  HGwyndolyn Kaufman MD

## 2022-01-10 NOTE — Progress Notes (Signed)
Mobility Specialist Progress Note:   01/10/22 1554  Mobility  Activity Ambulated with assistance to bathroom  Level of Assistance Minimal assist, patient does 75% or more  Assistive Device Four wheel walker  Distance Ambulated (ft) 30 ft  Activity Response Tolerated well  $Mobility charge 1 Mobility   Pt received in bed willing to participate in mobility. No complaints of pain. Min A to stand from bed and toilet. Left in bed with call bell in reach and all needs met.   [facility] Mobility Specialist

## 2022-01-10 NOTE — Progress Notes (Signed)
Pt refused to wear BIPAP machine to rest. Pt knows to notify RN to call RT if she changes her mind, machine at bedside.

## 2022-01-10 NOTE — TOC Benefit Eligibility Note (Signed)
Patient Teacher, English as a foreign language completed.    The patient is currently admitted and upon discharge could be taking Eliquis 5 mg.  The current 30 day co-pay is $45.00.   The patient is insured through [place], [place] Patient Advocate Specialist [place] Patient Advocate Team Direct Number: [phone]  Fax: [phone]

## 2022-01-11 DIAGNOSIS — J9601 Acute respiratory failure with hypoxia: Secondary | ICD-10-CM | POA: Diagnosis not present

## 2022-01-11 DIAGNOSIS — I5032 Chronic diastolic (congestive) heart failure: Secondary | ICD-10-CM | POA: Diagnosis not present

## 2022-01-11 DIAGNOSIS — J189 Pneumonia, unspecified organism: Secondary | ICD-10-CM | POA: Diagnosis not present

## 2022-01-11 DIAGNOSIS — I214 Non-ST elevation (NSTEMI) myocardial infarction: Secondary | ICD-10-CM | POA: Diagnosis not present

## 2022-01-11 LAB — CBC
HCT: 31.8 % — ABNORMAL LOW (ref 36.0–46.0)
Hemoglobin: 11.4 g/dL — ABNORMAL LOW (ref 12.0–15.0)
MCH: 34.7 pg — ABNORMAL HIGH (ref 26.0–34.0)
MCHC: 35.8 g/dL (ref 30.0–36.0)
MCV: 96.7 fL (ref 80.0–100.0)
Platelets: 251 10*3/uL (ref 150–400)
RBC: 3.29 MIL/uL — ABNORMAL LOW (ref 3.87–5.11)
RDW: 11.9 % (ref 11.5–15.5)
WBC: 6.9 10*3/uL (ref 4.0–10.5)
nRBC: 0 % (ref 0.0–0.2)

## 2022-01-11 LAB — BASIC METABOLIC PANEL
Anion gap: 9 (ref 5–15)
BUN: 13 mg/dL (ref 8–23)
CO2: 22 mmol/L (ref 22–32)
Calcium: 7.8 mg/dL — ABNORMAL LOW (ref 8.9–10.3)
Chloride: 100 mmol/L (ref 98–111)
Creatinine, Ser: 0.83 mg/dL (ref 0.44–1.00)
GFR, Estimated: >60 mL/min (ref >60)
Glucose, Bld: 97 mg/dL (ref 70–99)
Potassium: 3.9 mmol/L (ref 3.5–5.1)
Sodium: 131 mmol/L — ABNORMAL LOW (ref 135–145)

## 2022-01-11 LAB — BRAIN NATRIURETIC PEPTIDE: B Natriuretic Peptide: 316.5 pg/mL — ABNORMAL HIGH (ref 0.0–100.0)

## 2022-01-11 LAB — LIPOPROTEIN A (LPA): Lipoprotein (a): <8.4 nmol/L (ref <75.0)

## 2022-01-11 MED ORDER — MAGIC MOUTHWASH
5.0000 mL | Freq: Three times a day (TID) | ORAL | Status: DC | PRN
  Administered 2022-01-11: 5 mL via ORAL
  Filled 2022-01-11 (×2): qty 5

## 2022-01-11 MED ORDER — FUROSEMIDE 20 MG PO TABS: 20.0000 mg | ORAL_TABLET | Freq: Every day | ORAL | Status: DC

## 2022-01-11 NOTE — Progress Notes (Signed)
SW spoke with pt re PT/OT recommendation for SNF. Pt reports agreeable to SNF and requests that dc planning decisions be made by her DIL Jana Half. Spoke to DIL who confirmed agreeable to SNF for STR. SW provided current SNF offers and agreed to f/u with any additional tomorrow.   [employer] auth request for SNF and ambulance transport started. SW will provide updates as available.   Wandra Feinstein, MSW, LCSW [phone] (coverage)

## 2022-01-11 NOTE — Progress Notes (Addendum)
Rounding Note    Patient Name: Ellen Chandler Date of Encounter: 01/11/2022  [place] Cardiologist: Mertie Moores, MD   Subjective   No acute overnight events. Patient states she does not feel great this morning. She just feels very tired. She ambulated in the halls with PT earlier this morning and states she did OK with this. She did not feel short of breath while ambulating but states she felt short of breathing after coming back from walk and laying back in bed. She notes occasion lower chest/epigastric pain when she lays down which patient thinks is from her large hiatal hernia. No other chest discomfort. No palpitations.  Inpatient Medications    Scheduled Meds:  apixaban  5 mg Oral BID   metoprolol tartrate  12.5 mg Oral BID   polyethylene glycol  17 g Oral Daily   sodium chloride flush  3 mL Intravenous Q12H   sodium chloride flush  3 mL Intravenous Q12H   Continuous Infusions:  sodium chloride     PRN Meds: sodium chloride, acetaminophen **OR** acetaminophen, alum & mag hydroxide-simeth, levalbuterol, magnesium hydroxide, ondansetron **OR** ondansetron (ZOFRAN) IV, sodium chloride flush   Vital Signs    Vitals:   01/11/22 0448 01/11/22 0518 01/11/22 0725 01/11/22 0835  BP: (!) 86/68  120/64 (!) 112/53  Pulse: 61  67 64  Resp: '19  20 20  '$ Temp: (!) 97.5 F (36.4 C)  98.2 F (36.8 C) 97.6 F (36.4 C)  TempSrc: Oral  Oral Oral  SpO2: 99%  98% 97%  Weight:  64.1 kg    Height:        Intake/Output Summary (Last 24 hours) at 01/11/2022 0912 Last data filed at 01/11/2022 0449 Gross per 24 hour  Intake --  Output 200 ml  Net -200 ml      01/11/2022    5:18 AM 01/10/2022    5:00 AM 01/09/2022    4:49 AM  Last 3 Weights  Weight (lbs) 141 lb 5 oz 141 lb 12.8 oz 142 lb  Weight (kg) 64.1 kg 64.32 kg 64.411 kg      Telemetry    Normal sinus rhythm with rates mostly in the 60s but sometimes up to the 80s. Frequent PVCs (sometimes in bigeminy) -  Personally Reviewed  ECG    No new ECG tracing since 01/04/2022. - Personally Reviewed  Physical Exam   GEN: Elderly Caucasian female resting comfortably in no acute distress.   Neck: No JVD. Cardiac: RRR. No murmurs, rubs, or gallops.  Respiratory: No increased work of breathing. Decreased breath sounds bilaterally with possible very faint crackles and some scattered wheezing.  GI: Soft, non-distended, and non-tender. MS: No lower extremity edema. No deformity. Skin: Warm and dry. Neuro:  No focal deficits. Psych: Normal affect. Responds appropriately.  Labs    High Sensitivity Troponin:   Recent Labs  Lab 01/04/22 0416 01/04/22 0932 01/04/22 1145  TROPONINIHS 123* 128* 102*     Chemistry Recent Labs  Lab 01/05/22 0434 01/06/22 0159 01/07/22 0140 01/08/22 0408 01/09/22 0221 01/09/22 1606 01/09/22 1612 01/10/22 0723 01/11/22 0207  NA 136 130* 127*   < > 133*   < > 135 133* 131*  K 4.0 3.6 3.3*   < > 3.6   < > 3.7 4.0 3.9  CL 99 91* 89*   < > 97*  --   --  100 100  CO2 '28 28 27   '$ < > 29  --   --  25 22  GLUCOSE 95 122* 87   < > 97  --   --  96 97  BUN '21 20 21   '$ < > 24*  --   --  18 13  CREATININE 1.03* 0.85 0.88   < > 0.88  --   --  0.79 0.83  CALCIUM 9.4 9.6 8.7*   < > 8.2*  --   --  8.2* 7.8*  MG 2.0  --  1.8  --   --   --   --   --   --   PROT 5.7* 6.4* 5.7*  --   --   --   --   --   --   ALBUMIN 3.1* 3.6 3.2*  --   --   --   --   --   --   AST '19 21 24  '$ --   --   --   --   --   --   ALT '22 22 18  '$ --   --   --   --   --   --   ALKPHOS 30* 35* 31*  --   --   --   --   --   --   BILITOT 0.8 0.7 0.7  --   --   --   --   --   --   GFRNONAA 52* >60 >60   < > >60  --   --  >60 >60  ANIONGAP '9 11 11   '$ < > 7  --   --  8 9   < > = values in this interval not displayed.    Lipids  Recent Labs  Lab 01/05/22 0434  CHOL 121  TRIG 56  HDL 40*  LDLCALC 70  CHOLHDL 3.0    Hematology Recent Labs  Lab 01/09/22 0221 01/09/22 1606 01/09/22 1612 01/10/22 0723  01/11/22 0207  WBC 7.8  --   --  6.6 6.9  RBC 3.37*  --   --  3.51* 3.29*  HGB 11.3*   < > 11.9* 12.1 11.4*  HCT 32.4*   < > 35.0* 33.8* 31.8*  MCV 96.1  --   --  96.3 96.7  MCH 33.5  --   --  34.5* 34.7*  MCHC 34.9  --   --  35.8 35.8  RDW 11.6  --   --  11.8 11.9  PLT 258  --   --  272 251   < > = values in this interval not displayed.   Thyroid No results for input(s): "TSH", "FREET4" in the last 168 hours.  BNPNo results for input(s): "BNP", "PROBNP" in the last 168 hours.  DDimer No results for input(s): "DDIMER" in the last 168 hours.   Radiology    DG CHEST PORT 1 VIEW  Result Date: 01/10/2022 CLINICAL DATA:  Short of breath and chest pain today. EXAM: PORTABLE CHEST 1 VIEW COMPARISON:  01/04/2022 and older studies. FINDINGS: Large hiatal hernia. Cardiac silhouette is normal in size. No mediastinal or hilar masses. Mild reticular type opacities in the lung bases consistent with atelectasis. Lungs otherwise clear. Possible small effusions. No pneumothorax. Skeletal structures are grossly intact. IMPRESSION: 1. No acute cardiopulmonary disease. 2. Large hiatal hernia. Electronically Signed   By: Lajean Manes M.D.   On: 01/10/2022 13:00   CARDIAC CATHETERIZATION  Result Date: 01/09/2022 1.  Normal right dominant coronary circulation with no obstructive coronary artery disease. 2.  Mean RA pressure  of 1 mmHg, mean wedge pressure of 3 mmHg, cardiac output of 8.5 L/min and cardiac index of 5.1 L/min/m. 3.  LVEDP of 16 mmHg. 4.  Very tortuous right upper extremity that could not be navigated necessitating a femoral approach. Recommendation: Goal-directed medical therapy.    Cardiac Studies   Echocardiogram 01/04/2022: Impressions:  1. Left ventricular ejection fraction, by estimation, is 40 to 45%. The  left ventricle has mildly decreased function. The left ventricle has no  regional wall motion abnormalities. Left ventricular diastolic parameters  are consistent with Grade I   diastolic dysfunction (impaired relaxation). Elevated left ventricular  end-diastolic pressure.   2. Right ventricular systolic function is normal. The right ventricular  size is normal. There is normal pulmonary artery systolic pressure.   3. Left atrial size was moderately dilated.   4. The mitral valve is normal in structure. Trivial mitral valve  regurgitation. No evidence of mitral stenosis.   5. The aortic valve is tricuspid. There is mild calcification of the  aortic valve. There is mild thickening of the aortic valve. Aortic valve  regurgitation is trivial. No aortic stenosis is present.   6. The inferior vena cava is normal in size with greater than 50%  respiratory variability, suggesting right atrial pressure of 3 mmHg.  _______________   Right/ Left Cardiac Catheterization 01/09/2022: 1.  Normal right dominant coronary circulation with no obstructive coronary artery disease. 2.  Mean RA pressure of 1 mmHg, mean wedge pressure of 3 mmHg, cardiac output of 8.5 L/min and cardiac index of 5.1 L/min/m. 3.  LVEDP of 16 mmHg. 4.  Very tortuous right upper extremity that could not be navigated necessitating a femoral approach.   Recommendation: Goal-directed medical therapy.   Diagnostic Dominance: Right     Patient Profile     88 y.o. female with a history of HFpEF, hypertension, hypothyroidism, and GERD who was admitted on 01/04/2022 with CHF exacerbation and found to have EF of 40-45% (previously 60-65% in 2014) and new onset atrial flutter with RVR.  Assessment & Plan    Acute Combined CHF Patient presented with worsening dyspnea and was found to have BNP of 519 and pulmonary edema on chest imaging consistent with CHF. Echo showed LVEF 40-45% with no regional wall motion abnormalities and grade 1 diastolic dysfunction. She was initially scheduled for LHC on 01/05/2022 but this was cancelled due to patient's lethargy. Mental status improved and she went for [facility] on 9/5 which  showed normal coronaries with normal right sided filling pressures. LVEDP mildly elevated at 16 mmHg. Patient was initially diagnosed with IV Lasix but this was stopped on 01/08/2022 due to worsening hyponatremia. Net negative 5.289 L this admission. Weight stable the last few days but up 7 lbs from admission if admission weight accurate. Renal function stable but sodium level starting to drop again.  - Patient has decreased breath sounds bilateral and it does not sound like she is moving much air at all. Repeat chest x-ray yesterday showed no acute findings (central vascular congestion and left basilar infiltrate no longer noted). I am not convinced this is CHF now causing her symptoms although sodium level is starting to trend down and I wonder if that could be due to hypovolemia. Will repeat BNP to help determine whether we need to restart IV diuretics. - GDMT limited due to hypotension so far this admission. We added back Lopressor 12.'5mg'$  twice daily and BP would intermittent drop again. Continue this for now. If BP continues to  drop, we may have to stop this as well. I don't think she is going to tolerate any other GDMT. - Continue to monitor daily weights, strict I/Os, and renal function.   Newly Diagnosed Atrial Flutter with RVR Initially started on IV Cardizem but became hypotensive with this and was transitioned to Amiodarone which reportedly caused her to become more confused. She was ultimately placed on Metoprolol and converted back to sinus rhythm on 9/3. - Maintaining sinus rhythm with rates mostly in the 60s. Frequent PVCs noted on telemetry (sometimes in bigeminy pattern). - Continue Lopressor 12.'5mg'$  twice daily. - CHA2DS-VASc = 5 (CHF, HTN, age x2, gender). Continue Eliquis '5mg'$  twice daily.   Demand Ischemia High-sensitivity troponin mildly elevated and flat at 123  >> 128 >> 102. Echo showed LVEF of 40-45% with no regional wall motion abnormalities. Coronaries were clean on cath  yesterday. Troponin elevation due to demand ischemia in setting of acute CHF. - Will stop aspirin and statin given clean coronaries.   Hyponatremia Sodium dropped as low as 126 on 9/4. Improved to 145 with holding diuresis and giving IV fluids. However, sodium has been starting to slowly drop the last 2 days. Down to 131 today. - Will repeat BNP today to help guide decision on diuretics.  - Otherwise, per primary team.  For questions or updates, please contact [place] Please consult [email or web address] for contact info under        Signed, Darreld Mclean, PA-C  01/11/2022, 9:12 AM    Patient seen and examined and agree with Sande Rives, PA-C as detailed above.   In brief, the patient is a 88 year old female with history of HFpEF, HTN, hypothyroidism and GERD who presented with worsening dyspnea found to have acute on chronic HF exacerbation with mild drop in EF to 40-45% and new aflutter with RVR for which Cardiology has been consulted.    Underwent RHC/LHC 01/09/22 which showed normal coronaries, normal right sided filling pressures and mildly elevated LVEDP at 21mHg. She remains in NSR on the monitor.   Continues to have significant SOB, but filling pressures are not significantly elevated and cannot be explained by underlying HF. Na starting to trend down again to 131. Wonder if this is related to poor PO intake as she does not appear grossly overloaded. Will repeat BNP to help guide management.    GEN: Elderly female, mildly tachypneic  Neck: No JVD Cardiac: RRR, no murmurs, rubs, or gallops.  Respiratory: Decreased breath sounds with expiratory wheezing.  GI: Soft, nontender, non-distended  MS: No edema; No deformity. Neuro:  Nonfocal  Psych: Normal affect     Plan: -Filling pressures on cath normal with only mildly elevated LVEDP -No CAD on cath -Check BNP to assess for interval improvement; hesitant to resume lasix as she does not appear grossly overloaded and  admits to not eating much -Continue metop 12.'5mg'$  BID -GDMT very limited due to soft blood pressures -Continue apixaban '5mg'$  BID   HGwyndolyn Kaufman MD

## 2022-01-11 NOTE — Progress Notes (Signed)
Placed Pt on bipap then she asked to be taken off the machine immediately. Pt is now refusing bipap at this time and was informed to have RN call RT if she changes her mind.

## 2022-01-11 NOTE — Progress Notes (Signed)
  Progress Note   Patient: Ellen Chandler XNT:[id number] DOB: 06/16/1930 DOA: 01/03/2022     7 DOS: the patient was seen and examined on 01/11/2022   Brief hospital course: 53 Y female independent living facility resident HFpEF presented with increased sob with increased BNP and trop. Cardiology was consulted  Assessment and Plan: Acute respiratory failure with hypoxia ([place]) DDx = CAP vs COVID vs CHF vs MI. CXR looks like CAP No fever, no WBC. COVID and flu pending BNP elevation + rapid improvement on BIPAP (weaned off of BIPAP to 2L via [place] now) initially suspicious for CHF. -Pt now s/p normal heart cath -Discussed with Cardiology, symptoms likely not related to vol overload. Recs to recheck BNP to assess interval inprovement. Hesitant to resume lasix as pt does not appear vol overloaded   Chronic diastolic CHF (congestive heart failure) ([place]) Unclear if acute decompensation today causing SOB presentation: CXR looks more like PNA per radiologist though BNP elevated, no fever, no WBC. -No gross vol overload at this time. Hesitant to continue lasix at this time as pt does not appear grossly vol overloaded   History of ETOH abuse Pt with history of EtOH use/abuse, ED visit(s) noted for intoxication within the past 1 yr. -On CIWA protocol   Essential hypertension BP on soft side in ED. Holding amlodipine for the moment.       Subjective: Complaining of mouth pain after eating pizza  Physical Exam: Vitals:   01/11/22 0835 01/11/22 1056 01/11/22 1200 01/11/22 1549  BP: (!) 112/53 128/64 133/60 121/62  Pulse: 64 63 65 (!) 59  Resp: 20 20 (!) 21 16  Temp: 97.6 F (36.4 C) 97.9 F (36.6 C) 97.7 F (36.5 C) 98.6 F (37 C)  TempSrc: Oral Oral Oral Oral  SpO2: 97%  96% 96%  Weight:      Height:       General exam: Conversant, in no acute distress Respiratory system: normal chest rise, clear, no audible wheezing Cardiovascular system: regular rhythm, s1-s2 Gastrointestinal  system: Nondistended, nontender, pos BS Central nervous system: No seizures, no tremors Extremities: No cyanosis, no joint deformities Skin: No rashes, no pallor Psychiatry: Affect normal // no auditory hallucinations   Data Reviewed:  Labs reviewed: Na 131, K 3.9, Cr 0.83  Family Communication: Pt in room, family not at bedside  Disposition: Status is: Inpatient Remains inpatient appropriate because: Severity of illness  Planned Discharge Destination: Skilled nursing facility    Author: Marylu Lund, MD 01/11/2022 5:07 PM  For on call review [email or web address].

## 2022-01-11 NOTE — Progress Notes (Signed)
Physical Therapy Treatment Patient Details Name: Ellen Chandler MRN: [id number] DOB: [birth year] Today's Date: 01/11/2022   History of Present Illness 88 yo admitted 8/31 with SOB. Pt with pulmonary edema and CHF with demand ischemia. PMhx: HTN, HFpEF, GERD, hypothyroidism, ETOH abuse    PT Comments    Pt received supine and agreeable to session with good progress with continued focus on gait training for increased activity tolerance and improved balance. Pt demonstrating ambulation with rollator with min guard for safety, drifting slightly R/L throughout, however pt able to self correct in all instances and needing cues for rollator proximity and posture. Pt with no overt LOB throughout. Pt continues to be limited by decreased balance and postural reactions, general weakness and fatigue. Pt continues to benefit from skilled PT services to progress toward functional mobility goals.   HR 69-80 throughout    Recommendations for follow up therapy are one component of a multi-disciplinary discharge planning process, led by the attending physician.  Recommendations may be updated based on patient status, additional functional criteria and insurance authorization.  Follow Up Recommendations  Skilled nursing-short term rehab (<3 hours/day) Can patient physically be transported by private vehicle: No   Assistance Recommended at Discharge Frequent or constant Supervision/Assistance  Patient can return home with the following A lot of help with walking and/or transfers;A lot of help with bathing/dressing/bathroom;Assistance with cooking/housework;Direct supervision/assist for financial management;Assist for transportation;Direct supervision/assist for medications management   Equipment Recommendations  None recommended by PT    Recommendations for Other Services       Precautions / Restrictions Precautions Precautions: Fall Restrictions Weight Bearing Restrictions: No     Mobility  Bed  Mobility Overal bed mobility: Needs Assistance Bed Mobility: Supine to Sit, Sit to Supine     Supine to sit: Min guard, HOB elevated Sit to supine: Min guard, HOB elevated   General bed mobility comments: min gaurd for safety    Transfers Overall transfer level: Needs assistance Equipment used: Rolling walker (2 wheels), Rollator (4 wheels) Transfers: Sit to/from Stand Sit to Stand: Min guard           General transfer comment: min gaurd for safety from EOB with rollator, recliner and toliet with RW (rollator too large to fit in BR)    Ambulation/Gait Ambulation/Gait assistance: Min guard, Min assist Gait Distance (Feet): 250 Feet Assistive device: Rollator (4 wheels) Gait Pattern/deviations: Step-through pattern, Decreased stride length, Drifts right/left Gait velocity: decr     General Gait Details: slow mildy unsteady gait with with rollator, cues for posture and rolaltor proximity, drifting slightly R/L throughout but not overtly   Chief Strategy Officer    Modified Rankin (Stroke Patients Only)       Balance Overall balance assessment: Needs assistance Sitting-balance support: Feet supported Sitting balance-Leahy Scale: Good     Standing balance support: Bilateral upper extremity supported, During functional activity Standing balance-Leahy Scale: Fair Standing balance comment: able to stand with single UE support for pericare in bathroom                            Cognition Arousal/Alertness: Awake/alert Behavior During Therapy: WFL for tasks assessed/performed Overall Cognitive Status: Within Functional Limits for tasks assessed  Exercises      General Comments General comments (skin integrity, edema, etc.): HR 69 at rest on arrival, upper 70's throughout ambulation      Pertinent Vitals/Pain Pain Assessment Pain Assessment: Faces Faces Pain Scale: No  hurt Pain Intervention(s): Monitored during session    Home Living                          Prior Function            PT Goals (current goals can now be found in the care plan section) Acute Rehab PT Goals Patient Stated Goal: get moving PT Goal Formulation: With patient Time For Goal Achievement: 01/20/22    Frequency    Min 3X/week      PT Plan      Co-evaluation              AM-PAC PT "6 Clicks" Mobility   Outcome Measure  Help needed turning from your back to your side while in a flat bed without using bedrails?: A Little Help needed moving from lying on your back to sitting on the side of a flat bed without using bedrails?: A Little Help needed moving to and from a bed to a chair (including a wheelchair)?: A Little Help needed standing up from a chair using your arms (e.g., wheelchair or bedside chair)?: A Little Help needed to walk in hospital room?: A Little Help needed climbing 3-5 steps with a railing? : Total 6 Click Score: 16    End of Session Equipment Utilized During Treatment: Gait belt Activity Tolerance: Patient tolerated treatment well Patient left: with call bell/phone within reach;in bed;with bed alarm set Nurse Communication: Mobility status PT Visit Diagnosis: Other abnormalities of gait and mobility (R26.89);Difficulty in walking, not elsewhere classified (R26.2);Muscle weakness (generalized) (M62.81)     Time: 5053-9767 PT Time Calculation (min) (ACUTE ONLY): 21 min  Charges:  $Gait Training: 8-22 mins                     Elgar Scoggins R. PTA Acute Rehabilitation Services Office: [place] 01/11/2022, 9:45 AM

## 2022-01-12 ENCOUNTER — Other Ambulatory Visit (HOSPITAL_COMMUNITY): Payer: Self-pay

## 2022-01-12 DIAGNOSIS — Z741 Need for assistance with personal care: Secondary | ICD-10-CM | POA: Diagnosis not present

## 2022-01-12 DIAGNOSIS — R0602 Shortness of breath: Secondary | ICD-10-CM | POA: Diagnosis not present

## 2022-01-12 DIAGNOSIS — I480 Paroxysmal atrial fibrillation: Secondary | ICD-10-CM | POA: Diagnosis not present

## 2022-01-12 DIAGNOSIS — I10 Essential (primary) hypertension: Secondary | ICD-10-CM | POA: Diagnosis not present

## 2022-01-12 DIAGNOSIS — J189 Pneumonia, unspecified organism: Secondary | ICD-10-CM | POA: Diagnosis not present

## 2022-01-12 DIAGNOSIS — Z811 Family history of alcohol abuse and dependence: Secondary | ICD-10-CM | POA: Diagnosis not present

## 2022-01-12 DIAGNOSIS — F1011 Alcohol abuse, in remission: Secondary | ICD-10-CM | POA: Diagnosis not present

## 2022-01-12 DIAGNOSIS — F10130 Alcohol abuse with withdrawal, uncomplicated: Secondary | ICD-10-CM | POA: Diagnosis not present

## 2022-01-12 DIAGNOSIS — M6259 Muscle wasting and atrophy, not elsewhere classified, multiple sites: Secondary | ICD-10-CM | POA: Diagnosis not present

## 2022-01-12 DIAGNOSIS — I214 Non-ST elevation (NSTEMI) myocardial infarction: Secondary | ICD-10-CM | POA: Diagnosis not present

## 2022-01-12 DIAGNOSIS — M6281 Muscle weakness (generalized): Secondary | ICD-10-CM | POA: Diagnosis not present

## 2022-01-12 DIAGNOSIS — R2681 Unsteadiness on feet: Secondary | ICD-10-CM | POA: Diagnosis not present

## 2022-01-12 DIAGNOSIS — E038 Other specified hypothyroidism: Secondary | ICD-10-CM | POA: Diagnosis not present

## 2022-01-12 DIAGNOSIS — I5031 Acute diastolic (congestive) heart failure: Secondary | ICD-10-CM | POA: Diagnosis not present

## 2022-01-12 DIAGNOSIS — J9601 Acute respiratory failure with hypoxia: Secondary | ICD-10-CM | POA: Diagnosis not present

## 2022-01-12 DIAGNOSIS — J9600 Acute respiratory failure, unspecified whether with hypoxia or hypercapnia: Secondary | ICD-10-CM | POA: Diagnosis not present

## 2022-01-12 DIAGNOSIS — F1019 Alcohol abuse with unspecified alcohol-induced disorder: Secondary | ICD-10-CM | POA: Diagnosis not present

## 2022-01-12 DIAGNOSIS — I5032 Chronic diastolic (congestive) heart failure: Secondary | ICD-10-CM | POA: Diagnosis not present

## 2022-01-12 DIAGNOSIS — K2100 Gastro-esophageal reflux disease with esophagitis, without bleeding: Secondary | ICD-10-CM | POA: Diagnosis not present

## 2022-01-12 DIAGNOSIS — F1010 Alcohol abuse, uncomplicated: Secondary | ICD-10-CM | POA: Diagnosis not present

## 2022-01-12 LAB — BASIC METABOLIC PANEL
Anion gap: 9 (ref 5–15)
BUN: 15 mg/dL (ref 8–23)
CO2: 23 mmol/L (ref 22–32)
Calcium: 8.9 mg/dL (ref 8.9–10.3)
Chloride: 103 mmol/L (ref 98–111)
Creatinine, Ser: 0.75 mg/dL (ref 0.44–1.00)
GFR, Estimated: >60 mL/min (ref >60)
Glucose, Bld: 93 mg/dL (ref 70–99)
Potassium: 4.1 mmol/L (ref 3.5–5.1)
Sodium: 135 mmol/L (ref 135–145)

## 2022-01-12 LAB — CBC
HCT: 32.8 % — ABNORMAL LOW (ref 36.0–46.0)
Hemoglobin: 11.3 g/dL — ABNORMAL LOW (ref 12.0–15.0)
MCH: 34 pg (ref 26.0–34.0)
MCHC: 34.5 g/dL (ref 30.0–36.0)
MCV: 98.8 fL (ref 80.0–100.0)
Platelets: 289 10*3/uL (ref 150–400)
RBC: 3.32 MIL/uL — ABNORMAL LOW (ref 3.87–5.11)
RDW: 11.9 % (ref 11.5–15.5)
WBC: 6.9 10*3/uL (ref 4.0–10.5)
nRBC: 0 % (ref 0.0–0.2)

## 2022-01-12 LAB — MAGNESIUM: Magnesium: 2.2 mg/dL (ref 1.7–2.4)

## 2022-01-12 MED ORDER — FUROSEMIDE 20 MG PO TABS
ORAL_TABLET | ORAL | 0 refills | Status: DC
Start: 2022-01-12 — End: 2022-02-07
  Filled 2022-01-12: qty 30, 30d supply, fill #0

## 2022-01-12 MED ORDER — METOPROLOL SUCCINATE ER 25 MG PO TB24
25.0000 mg | ORAL_TABLET | Freq: Every day | ORAL | 2 refills | Status: DC
  Filled 2022-01-12: qty 30, 30d supply, fill #0

## 2022-01-12 MED ORDER — METOPROLOL SUCCINATE ER 25 MG PO TB24: 25.0000 mg | ORAL_TABLET | Freq: Every day | ORAL | Status: DC

## 2022-01-12 MED ORDER — APIXABAN 5 MG PO TABS
5.0000 mg | ORAL_TABLET | Freq: Two times a day (BID) | ORAL | 0 refills | Status: DC
  Filled 2022-01-12: qty 60, 30d supply, fill #0

## 2022-01-12 MED ORDER — METOPROLOL TARTRATE 25 MG PO TABS
12.5000 mg | ORAL_TABLET | Freq: Two times a day (BID) | ORAL | 0 refills | Status: DC
  Filled 2022-01-12: qty 30, 30d supply, fill #0

## 2022-01-12 NOTE — Discharge Summary (Signed)
Physician Discharge Summary   Patient: Ellen Chandler MRN: [id number] DOB: 18-Oct-1930  Admit date:     01/03/2022  Discharge date: 01/12/22  Discharge Physician: Marylu Lund   PCP: Deland Pretty, MD   Recommendations at discharge:    Follow up with PCP in 1-2 weeks Follow up with Cardiology as scheduled  Discharge Diagnoses: Principal Problem:   Non-ST elevation (NSTEMI) myocardial infarction [facility]) Active Problems:   Acute respiratory failure with hypoxia (HCC)   Chronic diastolic CHF (congestive heart failure) (HCC)   Essential hypertension   History of ETOH abuse   Acute diastolic CHF (congestive heart failure) ([place])  Resolved Problems:   * No resolved hospital problems. *  Hospital Course: 32 Y female independent living facility resident HFpEF presented with increased sob with increased BNP and trop. Cardiology was consulted  Assessment and Plan: Acute respiratory failure with hypoxia ([place]) DDx = CAP vs COVID vs CHF vs MI. CXR looks like CAP No fever, no WBC. COVID and flu pending BNP elevation + rapid improvement on BIPAP (weaned off of BIPAP to 2L via [place] now) initially suspicious for CHF. -Pt now s/p normal heart cath -Discussed with Cardiology, symptoms likely not related to vol overload.  -Cardiology recs for metoprolol 12.'5mg'$  bid and eliquis '5mg'$  bid and to cont lasix '20mg'$  on daily PRN basis for edema or wt gain   Chronic diastolic CHF (congestive heart failure) ([place]) Unclear if acute decompensation today causing SOB presentation: CXR looks more like PNA per radiologist though BNP elevated, no fever, no WBC. -No gross vol overload at this time.    History of ETOH abuse Pt with history of EtOH use/abuse, ED visit(s) noted for intoxication within the past 1 yr. -On CIWA protocol   Essential hypertension BP on soft side in ED. Held amlodipine for the moment. BP remained stable       Consultants: Cardiology Procedures performed: Heart cath   Disposition: Skilled nursing facility Diet recommendation:  Cardiac diet DISCHARGE MEDICATION: Allergies as of 01/12/2022   No Known Allergies      Medication List     STOP taking these medications    amLODipine 5 MG tablet Commonly known as: NORVASC       TAKE these medications    acetaminophen 500 MG tablet Commonly known as: TYLENOL Take 1,000 mg by mouth every 6 (six) hours as needed for moderate pain.   apixaban 5 MG Tabs tablet Commonly known as: ELIQUIS Take 1 tablet (5 mg total) by mouth 2 (two) times daily.   furosemide 20 MG tablet Commonly known as: Lasix Take one tab daily as needed (PRN) for fluid or edema   lactose free nutrition Liqd Take 237 mLs by mouth at bedtime.   levothyroxine 88 MCG tablet Commonly known as: SYNTHROID Take 88 mcg by mouth daily before breakfast.   metoprolol tartrate 25 MG tablet Commonly known as: LOPRESSOR Take 0.5 tablets (12.5 mg total) by mouth 2 (two) times daily.   multivitamin with minerals Tabs tablet Take 1 tablet by mouth daily.   Myrbetriq 50 MG Tb24 tablet Generic drug: mirabegron ER Take 50 mg by mouth daily.   nabumetone 500 MG tablet Commonly known as: RELAFEN Take 500 mg by mouth daily.   pantoprazole 40 MG tablet Commonly known as: PROTONIX Take 40 mg by mouth 2 (two) times daily.        Follow-up Information     [place], Crista Luria, [place] Follow up on 02/08/2022.   Specialty: Cardiology Why: @ 10:30am  for hospital follow up Contact information: [address] [place] [address] [phone]         Deland Pretty, MD Follow up in 1 week(s).   Specialty: Internal Medicine Why: Hospital follow up Contact information: [place] [address] [phone]         Nahser, Wonda Cheng, MD .   Specialty: Cardiology Contact information: [address] [place] [address] [phone]                Discharge  Exam: Danley Danker Weights   01/10/22 0500 01/11/22 0518 01/12/22 0522  Weight: 64.3 kg 64.1 kg 63.6 kg   General exam: Awake, laying in bed, in nad Respiratory system: Normal respiratory effort, no wheezing Cardiovascular system: regular rate, s1, s2 Gastrointestinal system: Soft, nondistended, positive BS Central nervous system: CN2-12 grossly intact, strength intact Extremities: Perfused, no clubbing Skin: Normal skin turgor, no notable skin lesions seen Psychiatry: Mood normal // no visual hallucinations   Condition at discharge: fair  The results of significant diagnostics from this hospitalization (including imaging, microbiology, ancillary and laboratory) are listed below for reference.   Imaging Studies: DG CHEST PORT 1 VIEW  Result Date: 01/10/2022 CLINICAL DATA:  Short of breath and chest pain today. EXAM: PORTABLE CHEST 1 VIEW COMPARISON:  01/04/2022 and older studies. FINDINGS: Large hiatal hernia. Cardiac silhouette is normal in size. No mediastinal or hilar masses. Mild reticular type opacities in the lung bases consistent with atelectasis. Lungs otherwise clear. Possible small effusions. No pneumothorax. Skeletal structures are grossly intact. IMPRESSION: 1. No acute cardiopulmonary disease. 2. Large hiatal hernia. Electronically Signed   By: Lajean Manes M.D.   On: 01/10/2022 13:00   CARDIAC CATHETERIZATION  Result Date: 01/09/2022 1.  Normal right dominant coronary circulation with no obstructive coronary artery disease. 2.  Mean RA pressure of 1 mmHg, mean wedge pressure of 3 mmHg, cardiac output of 8.5 L/min and cardiac index of 5.1 L/min/m. 3.  LVEDP of 16 mmHg. 4.  Very tortuous right upper extremity that could not be navigated necessitating a femoral approach. Recommendation: Goal-directed medical therapy.   CARDIAC CATHETERIZATION  Result Date: 01/05/2022 Cardiac cath cancelled due to lethargy and change in level of consciousness.   ECHOCARDIOGRAM COMPLETE  Result  Date: 01/04/2022    ECHOCARDIOGRAM REPORT   Patient Name:   Ellen Chandler Date of Exam: 01/04/2022 Medical Rec #:  [id number]         Height:       64.0 in Accession #:    [id number]        Weight:       134.5 lb Date of Birth:  12-Jan-1931         BSA:          1.653 m Patient Age:    88 years          BP:           129/82 mmHg Patient Gender: F                 HR:           108 bpm. Exam Location:  Inpatient Procedure: 2D Echo, Cardiac Doppler and Color Doppler Indications:    CHF-Acute Diastolic S34.19  History:        Patient has prior history of Echocardiogram examinations, most                 recent 05/05/2013.  CHF, Angina; Risk Factors:Hypertension.  Sonographer:    Ronny Flurry Referring Phys: [place]  1. Left ventricular ejection fraction, by estimation, is 40 to 45%. The left ventricle has mildly decreased function. The left ventricle has no regional wall motion abnormalities. Left ventricular diastolic parameters are consistent with Grade I diastolic dysfunction (impaired relaxation). Elevated left ventricular end-diastolic pressure.  2. Right ventricular systolic function is normal. The right ventricular size is normal. There is normal pulmonary artery systolic pressure.  3. Left atrial size was moderately dilated.  4. The mitral valve is normal in structure. Trivial mitral valve regurgitation. No evidence of mitral stenosis.  5. The aortic valve is tricuspid. There is mild calcification of the aortic valve. There is mild thickening of the aortic valve. Aortic valve regurgitation is trivial. No aortic stenosis is present.  6. The inferior vena cava is normal in size with greater than 50% respiratory variability, suggesting right atrial pressure of 3 mmHg. FINDINGS  Left Ventricle: Left ventricular ejection fraction, by estimation, is 40 to 45%. The left ventricle has mildly decreased function. The left ventricle has no regional wall motion abnormalities. The left  ventricular internal cavity size was normal in size. There is no left ventricular hypertrophy. Left ventricular diastolic parameters are consistent with Grade I diastolic dysfunction (impaired relaxation). Elevated left ventricular end-diastolic pressure.  LV Wall Scoring: The entire septum, apical anterior segment, apical inferior segment, and apex are hypokinetic. The anterior wall, entire lateral wall, and inferior wall are normal. Right Ventricle: The right ventricular size is normal. No increase in right ventricular wall thickness. Right ventricular systolic function is normal. There is normal pulmonary artery systolic pressure. The tricuspid regurgitant velocity is 1.57 m/s, and  with an assumed right atrial pressure of 3 mmHg, the estimated right ventricular systolic pressure is 75.6 mmHg. Left Atrium: Left atrial size was moderately dilated. Right Atrium: Right atrial size was normal in size. Pericardium: There is no evidence of pericardial effusion. Mitral Valve: The mitral valve is normal in structure. Trivial mitral valve regurgitation. No evidence of mitral valve stenosis. Tricuspid Valve: The tricuspid valve is normal in structure. Tricuspid valve regurgitation is trivial. No evidence of tricuspid stenosis. Aortic Valve: The aortic valve is tricuspid. There is mild calcification of the aortic valve. There is mild thickening of the aortic valve. Aortic valve regurgitation is trivial. No aortic stenosis is present. Pulmonic Valve: The pulmonic valve was normal in structure. Pulmonic valve regurgitation is not visualized. No evidence of pulmonic stenosis. Aorta: The aortic root is normal in size and structure. Venous: The inferior vena cava is normal in size with greater than 50% respiratory variability, suggesting right atrial pressure of 3 mmHg. IAS/Shunts: No atrial level shunt detected by color flow Doppler.  LEFT VENTRICLE PLAX 2D LVIDd:         4.80 cm   Diastology LVIDs:         3.80 cm   LV e'  medial:   7.46 cm/s LV PW:         0.90 cm   LV E/e' medial: 17.0 LV IVS:        0.70 cm LVOT diam:     1.80 cm LV SV:         62 LV SV Index:   38 LVOT Area:     2.54 cm  RIGHT VENTRICLE RV S prime:     11.90 cm/s TAPSE (M-mode): 2.4 cm LEFT ATRIUM  Index        RIGHT ATRIUM           Index LA diam:        4.00 cm 2.42 cm/m   RA Area:     14.20 cm LA Vol (A2C):   61.2 ml 37.03 ml/m  RA Volume:   33.20 ml  20.09 ml/m LA Vol (A4C):   55.3 ml 33.46 ml/m LA Biplane Vol: 59.9 ml 36.24 ml/m  AORTIC VALVE LVOT Vmax:   107.00 cm/s LVOT Vmean:  73.600 cm/s LVOT VTI:    0.245 m  AORTA Ao Root diam: 3.30 cm Ao Asc diam:  3.40 cm MITRAL VALVE                TRICUSPID VALVE MV Area (PHT): 4.24 cm     TR Peak grad:   9.9 mmHg MV Decel Time: 179 msec     TR Vmax:        157.00 cm/s MV E velocity: 127.00 cm/s MV A velocity: 132.00 cm/s  SHUNTS MV E/A ratio:  0.96         Systemic VTI:  0.24 m                             Systemic Diam: 1.80 cm Skeet Latch MD Electronically signed by Skeet Latch MD Signature Date/Time: 01/04/2022/5:53:23 PM    Final    CT Angio Chest Pulmonary Embolism (PE) W or WO Contrast  Result Date: 01/04/2022 CLINICAL DATA:  Rule out pulmonary embolus. Chest pain. Shortness of breath. EXAM: CT ANGIOGRAPHY CHEST WITH CONTRAST TECHNIQUE: Multidetector CT imaging of the chest was performed using the standard protocol during bolus administration of intravenous contrast. Multiplanar CT image reconstructions and MIPs were obtained to evaluate the vascular anatomy. RADIATION DOSE REDUCTION: This exam was performed according to the departmental dose-optimization program which includes automated exposure control, adjustment of the mA and/or kV according to patient size and/or use of iterative reconstruction technique. CONTRAST:  55m OMNIPAQUE IOHEXOL 350 MG/ML SOLN COMPARISON:  None Available. FINDINGS: Cardiovascular: Satisfactory opacification of the pulmonary arteries to the  segmental level. No evidence of pulmonary embolism. Normal heart size. Aortic atherosclerotic calcifications and coronary artery calcifications. No pericardial effusion. Mediastinum/Nodes: Thyroid gland, trachea are unremarkable. There is a large hiatal hernia identified which contains at least 50% intrathoracic stomach. Circumferential wall thickening of the distal esophagus just above the hiatal hernia is noted which may reflect esophagitis. No enlarged axillary, supraclavicular, mediastinal or hilar lymph nodes. Lungs/Pleura: Small bilateral pleural effusions are identified. There is diffuse interlobular septal thickening and ground-glass attenuation bilaterally concerning for interstitial edema. Atelectasis is noted within the posterior lower lobes overlying both pleural effusions. There is also atelectasis versus scarring noted within the inferior lingula. A few scattered patchy airspace densities are noted within the posterolateral right upper lobe and superior segment of right lower lobe. Within the medial apical segment of the right upper lobe there is a subpleural nodular density measuring 1.8 by 1.0 by 0.8 cm, image 23/5 and image 76/8. This appears similar to cervical spine CT from 02/14/2021 and is favored to represent an area of pleuroparenchymal scarring. Upper Abdomen: Large stone is identified within the gallbladder measuring 2.5 cm. This is only partially visualized. No acute abnormality. Musculoskeletal: Diffuse osteopenia. Midthoracic spine compression deformity is stable compared with chest radiograph from 06/17/2021. Review of the MIP images confirms the above findings. IMPRESSION: 1. No evidence for acute  pulmonary embolism. 2. Diffuse interlobular septal thickening and ground-glass attenuation bilaterally concerning for interstitial edema. 3. Small bilateral pleural effusions with overlying atelectasis. 4. A few scattered patchy airspace densities are noted within the right lung. Findings may  reflect inflammation or infection versus asymmetric areas of alveolar edema. 5. Large hiatal hernia. Circumferential wall thickening of the distal esophagus just above the hiatal hernia may reflect esophagitis. 6. Cholelithiasis. 7. Stable midthoracic spine compression deformity. 8. Coronary artery calcifications. 9. Aortic Atherosclerosis (ICD10-I70.0). Electronically Signed   By: Kerby Moors M.D.   On: 01/04/2022 10:24   DG Chest Port 1 View  Result Date: 01/04/2022 CLINICAL DATA:  Shortness of breath EXAM: PORTABLE CHEST 1 VIEW COMPARISON:  06/17/2021 FINDINGS: Cardiac shadow is enlarged but stable. Aortic calcifications are again seen. Large hiatal hernia is again noted. The lungs are well aerated bilaterally. Patchy airspace opacity is noted in the left base likely related to acute infiltrate. Mild central vascular congestion is noted. No bony abnormality is seen. IMPRESSION: Central vascular congestion. Left basilar infiltrate consistent with acute pneumonia. Electronically Signed   By: Inez Catalina M.D.   On: 01/04/2022 00:19    Microbiology: Results for orders placed or performed during the hospital encounter of 01/03/22  Blood Culture (routine x 2)     Status: None   Collection Time: 01/04/22  1:45 AM   Specimen: BLOOD  Result Value Ref Range Status   Specimen Description BLOOD LEFT ANTECUBITAL  Final   Special Requests   Final    BOTTLES DRAWN AEROBIC AND ANAEROBIC Blood Culture adequate volume   Culture   Final    NO GROWTH 5 DAYS Performed at [facility], [place] [address] [place], [place] [address]    Report Status 01/09/2022 FINAL  Final  Blood Culture (routine x 2)     Status: None   Collection Time: 01/04/22  1:49 AM   Specimen: BLOOD LEFT WRIST  Result Value Ref Range Status   Specimen Description BLOOD LEFT WRIST  Final   Special Requests   Final    BOTTLES DRAWN AEROBIC AND ANAEROBIC Blood Culture adequate volume   Culture   Final    NO GROWTH 5 DAYS Performed  at [facility], [place] [address] [place], [place] [address]    Report Status 01/09/2022 FINAL  Final  Resp Panel by RT-PCR (Flu A&B, Covid) Anterior Nasal Swab     Status: None   Collection Time: 01/04/22  2:25 AM   Specimen: Anterior Nasal Swab  Result Value Ref Range Status   SARS Coronavirus 2 by RT PCR NEGATIVE NEGATIVE Final    Comment: (NOTE) SARS-CoV-2 target nucleic acids are NOT DETECTED.  The SARS-CoV-2 RNA is generally detectable in upper respiratory specimens during the acute phase of infection. The lowest concentration of SARS-CoV-2 viral copies this assay can detect is 138 copies/mL. A negative result does not preclude SARS-Cov-2 infection and should not be used as the sole basis for treatment or other patient management decisions. A negative result may occur with  improper specimen collection/handling, submission of specimen other than nasopharyngeal swab, presence of viral mutation(s) within the areas targeted by this assay, and inadequate number of viral copies(<138 copies/mL). A negative result must be combined with clinical observations, patient history, and epidemiological information. The expected result is Negative.  Fact Sheet for Patients:  EntrepreneurPulse.com.au  Fact Sheet for Healthcare Providers:  IncredibleEmployment.be  This test is no t yet approved or cleared by the [place]  FDA and  has been authorized for detection and/or diagnosis of SARS-CoV-2 by FDA under an Emergency Use Authorization (EUA). This EUA will remain  in effect (meaning this test can be used) for the duration of the COVID-19 declaration under Section 564(b)(1) of the Act, 21 U.S.C.section 360bbb-3(b)(1), unless the authorization is terminated  or revoked sooner.       Influenza A by PCR NEGATIVE NEGATIVE Final   Influenza B by PCR NEGATIVE NEGATIVE Final    Comment: (NOTE) The Xpert Xpress SARS-CoV-2/FLU/RSV plus assay is  intended as an aid in the diagnosis of influenza from Nasopharyngeal swab specimens and should not be used as a sole basis for treatment. Nasal washings and aspirates are unacceptable for Xpert Xpress SARS-CoV-2/FLU/RSV testing.  Fact Sheet for Patients: EntrepreneurPulse.com.au  Fact Sheet for Healthcare Providers: IncredibleEmployment.be  This test is not yet approved or cleared by the [place] FDA and has been authorized for detection and/or diagnosis of SARS-CoV-2 by FDA under an Emergency Use Authorization (EUA). This EUA will remain in effect (meaning this test can be used) for the duration of the COVID-19 declaration under Section 564(b)(1) of the Act, 21 U.S.C. section 360bbb-3(b)(1), unless the authorization is terminated or revoked.  Performed at [facility], [place] [address], [place], [place] [address]     Labs: CBC: Recent Labs  Lab 01/08/22 1335 01/09/22 0221 01/09/22 1606 01/09/22 1611 01/09/22 1612 01/10/22 0723 01/11/22 0207 01/12/22 0622  WBC 8.0 7.8  --   --   --  6.6 6.9 6.9  HGB 11.7* 11.3*   < > 12.2 11.9* 12.1 11.4* 11.3*  HCT 32.6* 32.4*   < > 36.0 35.0* 33.8* 31.8* 32.8*  MCV 92.9 96.1  --   --   --  96.3 96.7 98.8  PLT 274 258  --   --   --  272 251 289   < > = values in this interval not displayed.   Basic Metabolic Panel: Recent Labs  Lab 01/07/22 0140 01/08/22 0408 01/09/22 0221 01/09/22 1606 01/09/22 1611 01/09/22 1612 01/10/22 0723 01/11/22 0207 01/12/22 0622 01/12/22 0822  NA 127* 126* 133*   < > 135 135 133* 131* 135  --   K 3.3* 3.3* 3.6   < > 3.7 3.7 4.0 3.9 4.1  --   CL 89* 89* 97*  --   --   --  100 100 103  --   CO2 '27 28 29  '$ --   --   --  '25 22 23  '$ --   GLUCOSE 87 96 97  --   --   --  96 97 93  --   BUN 21 28* 24*  --   --   --  '18 13 15  '$ --   CREATININE 0.88 0.89 0.88  --   --   --  0.79 0.83 0.75  --   CALCIUM 8.7* 8.4* 8.2*  --   --   --  8.2* 7.8* 8.9  --   MG 1.8   --   --   --   --   --   --   --   --  2.2   < > = values in this interval not displayed.   Liver Function Tests: Recent Labs  Lab 01/06/22 0159 01/07/22 0140  AST 21 24  ALT 22 18  ALKPHOS 35* 31*  BILITOT 0.7 0.7  PROT 6.4* 5.7*  ALBUMIN 3.6 3.2*   CBG: Recent Labs  Lab 01/05/22 1759  GLUCAP 159*    Discharge time spent: less than 30 minutes.  Signed: Marylu Lund, MD [facility] 01/12/2022

## 2022-01-12 NOTE — Progress Notes (Signed)
PT Cancellation Note  Patient Details Name: Ellen Chandler MRN: [id number] DOB: 08/30/1930   Cancelled Treatment:    Reason Eval/Treat Not Completed: Other (comment), pt declining all mobility and ADLs despite encouragement and education of benefits. Will continue to follow acutely.  Audry Riles. PTA Acute Rehabilitation Services Office: [place] 01/12/2022, 3:06 PM

## 2022-01-12 NOTE — Progress Notes (Signed)
Pt discharged with family to transfer to [facility].  Provided meds from [place] and two copies of AVS, one for family and one for facility.  Attempted to call report to [facility], was unable to speak directly to RN.  Left message with unit phone number to secretary who said he would have RN call me back for report.

## 2022-01-12 NOTE — TOC Transition Note (Addendum)
Transition of Care [facility]) - CM/SW Discharge Note   Patient Details  Name: Ellen Chandler MRN: [id number] Date of Birth: 06-20-1930  [facility]) CM/SW Contact:  Vinie Sill, LCSW Phone Number: 01/12/2022, 11:20 AM   Clinical Narrative:     Received authorization  for SNF approval # [id number] for 6 days - PTAR transport under review- will discuss with family approval and they may have to transport patient to rehab.   CSW called patient's son and daughter in law- no answer  Received text from patient's son- unable to talk at this time. CSW replied to return call when available and provide bed offers. CSW needs SNF choice.  TOC will continue to follow and assist with discharge planning.     Barriers to Discharge:  (awaiting SNF choice from family)   Patient Goals and CMS Choice Patient states their goals for this hospitalization and ongoing recovery are:: to go rehab CMS Medicare.gov Compare Post Acute Care list provided to:: Patient Choice offered to / list presented to : Patient  Discharge Placement                       Discharge Plan and Services In-house Referral: Clinical Social Work                                   Social Determinants of Health (SDOH) Interventions     Readmission Risk Interventions     No data to display

## 2022-01-12 NOTE — TOC Progression Note (Signed)
Transition of Care [facility]) - Progression Note    Patient Details  Name: Ellen Chandler MRN: [id number] Date of Birth: 1930-05-24  Transition of Care [facility]) CM/SW [place], [place] Phone Number: 01/12/2022, 12:47 PM  Clinical Narrative:     PSARR #  [id number]  Expected Discharge Plan: [place] Barriers to Discharge:  (awaiting SNF choice from family)  Expected Discharge Plan and Services Expected Discharge Plan: [place] In-house Referral: Clinical Social Work     Living arrangements for the past 2 months: [place]                                       Social Determinants of Health (SDOH) Interventions    Readmission Risk Interventions     No data to display

## 2022-01-12 NOTE — Progress Notes (Signed)
Mobility Specialist Progress Note:   01/12/22 0924  Mobility  Activity Ambulated with assistance in hallway  Level of Assistance Standby assist, set-up cues, supervision of patient - no hands on  Assistive Device Four wheel walker  Distance Ambulated (ft) 280 ft  Activity Response Tolerated well  $Mobility charge 1 Mobility   Pt received in bed willing to participate in mobility. No complaints of pain. Left in chair with call bell in reach and all needs met.   [facility] [name] Mobility Specialist

## 2022-01-12 NOTE — Progress Notes (Signed)
Occupational Therapy Treatment Patient Details Name: Ellen Chandler MRN: [id number] DOB: 1930-11-21 Today's Date: 01/12/2022   History of present illness 88 yo admitted 8/31 with SOB. Pt with pulmonary edema and CHF with demand ischemia. PMhx: HTN, HFpEF, GERD, hypothyroidism, ETOH abuse   OT comments  Pt progressing towards acute OT goals. Worked on functional (toilet)transfers this session. D/c recommendation remains appropriate.   Recommendations for follow up therapy are one component of a multi-disciplinary discharge planning process, led by the attending physician.  Recommendations may be updated based on patient status, additional functional criteria and insurance authorization.    Follow Up Recommendations  Skilled nursing-short term rehab (<3 hours/day)    Assistance Recommended at Discharge Frequent or constant Supervision/Assistance  Patient can return home with the following  A little help with walking and/or transfers;A lot of help with bathing/dressing/bathroom;Assistance with cooking/housework;Assist for transportation;Direct supervision/assist for financial management;Direct supervision/assist for medications management   Equipment Recommendations  Other (comment) (defer to next venue)    Recommendations for Other Services      Precautions / Restrictions Precautions Precautions: Fall Restrictions Weight Bearing Restrictions: No       Mobility Bed Mobility Overal bed mobility: Needs Assistance Bed Mobility: Supine to Sit, Sit to Supine     Supine to sit: Min assist Sit to supine: Min guard, HOB elevated   General bed mobility comments: pt reaching for therapist hand to facilitate trunk powerup and scooting to full EOB position    Transfers Overall transfer level: Needs assistance Equipment used: Rolling walker (2 wheels) Transfers: Sit to/from Stand Sit to Stand: Min guard           General transfer comment: to/from EOB and BSC (urgency to  void)     Balance Overall balance assessment: Needs assistance Sitting-balance support: Feet supported Sitting balance-Leahy Scale: Good     Standing balance support: Bilateral upper extremity supported, During functional activity Standing balance-Leahy Scale: Fair                             ADL either performed or assessed with clinical judgement   ADL Overall ADL's : Needs assistance/impaired                         Toilet Transfer: Minimal assistance;Cueing for safety;Cueing for sequencing;Rolling walker (2 wheels);BSC/3in1   Toileting- Clothing Manipulation and Hygiene: Minimal assistance;Sit to/from stand         General ADL Comments: (P) EOB <> recliner    Extremity/Trunk Assessment Upper Extremity Assessment Upper Extremity Assessment: Generalized weakness   Lower Extremity Assessment Lower Extremity Assessment: Defer to PT evaluation        Vision       Perception     Praxis      Cognition Arousal/Alertness: Awake/alert Behavior During Therapy: WFL for tasks assessed/performed Overall Cognitive Status: Within Functional Limits for tasks assessed                                          Exercises      Shoulder Instructions       General Comments      Pertinent Vitals/ Pain       Pain Assessment Pain Assessment: No/denies pain  Home Living  Prior Functioning/Environment              Frequency  Min 2X/week        Progress Toward Goals  OT Goals(current goals can now be found in the care plan section)  Progress towards OT goals: Progressing toward goals  Acute Rehab OT Goals Patient Stated Goal: to still be able "to make it to my birthday trip" later this month OT Goal Formulation: With patient Time For Goal Achievement: 01/23/22 Potential to Achieve Goals: Good ADL Goals Pt Will Perform Grooming: with modified  independence;sitting;standing Pt Will Perform Upper Body Bathing: with modified independence;sitting;standing Pt Will Perform Lower Body Bathing: with modified independence;with adaptive equipment;sit to/from stand Pt Will Transfer to Toilet: with modified independence;ambulating;regular height toilet  Plan Discharge plan remains appropriate    Co-evaluation                 AM-PAC OT "6 Clicks" Daily Activity     Outcome Measure   Help from another person eating meals?: None Help from another person taking care of personal grooming?: None Help from another person toileting, which includes using toliet, bedpan, or urinal?: A Little Help from another person bathing (including washing, rinsing, drying)?: A Lot Help from another person to put on and taking off regular upper body clothing?: A Little Help from another person to put on and taking off regular lower body clothing?: A Little 6 Click Score: 19    End of Session Equipment Utilized During Treatment: Rolling walker (2 wheels)  OT Visit Diagnosis: Unsteadiness on feet (R26.81);Other abnormalities of gait and mobility (R26.89);Muscle weakness (generalized) (M62.81);Pain   Activity Tolerance Patient tolerated treatment well   Patient Left in bed;with call bell/phone within reach;with bed alarm set;Other (comment) (with MD)   Nurse Communication          Time: 0100-7121 OT Time Calculation (min): 13 min  Charges: OT General Charges $OT Visit: 1 Visit OT Treatments $Self Care/Home Management : 8-22 mins  Tyrone Schimke, OT Acute Rehabilitation Services Office: [phone]   Hortencia Pilar 01/12/2022, 12:42 PM

## 2022-01-12 NOTE — Progress Notes (Addendum)
Rounding Note    Patient Name: SADIRA STANDARD Date of Encounter: 01/12/2022  [place] Cardiologist: Mertie Moores, MD   Subjective   Reports breathing issue with movement or walking. No chest pain.   Inpatient Medications    Scheduled Meds:  apixaban  5 mg Oral BID   metoprolol tartrate  12.5 mg Oral BID   polyethylene glycol  17 g Oral Daily   sodium chloride flush  3 mL Intravenous Q12H   sodium chloride flush  3 mL Intravenous Q12H   Continuous Infusions:  sodium chloride     PRN Meds: sodium chloride, acetaminophen **OR** acetaminophen, alum & mag hydroxide-simeth, levalbuterol, magic mouthwash, magnesium hydroxide, ondansetron **OR** ondansetron (ZOFRAN) IV, sodium chloride flush   Vital Signs    Vitals:   01/11/22 2000 01/11/22 2327 01/12/22 0522 01/12/22 0804  BP:  (!) 125/56 127/64 (!) 147/68  Pulse:  61 68 70  Resp: 20 20 (!) 24 (!) 25  Temp:  98.4 F (36.9 C) 98.3 F (36.8 C) 97.6 F (36.4 C)  TempSrc:  Oral Oral Axillary  SpO2:  93% 96% 95%  Weight:   63.6 kg   Height:       No intake or output data in the 24 hours ending 01/12/22 0908    01/12/2022    5:22 AM 01/11/2022    5:18 AM 01/10/2022    5:00 AM  Last 3 Weights  Weight (lbs) 140 lb 3.4 oz 141 lb 5 oz 141 lb 12.8 oz  Weight (kg) 63.6 kg 64.1 kg 64.32 kg      Telemetry    SR, Frequent PVCs - Personally Reviewed  ECG    N/A  Physical Exam   GEN: No acute distress.   Neck: No JVD Cardiac: RRR, no murmurs, rubs, or gallops.  Respiratory: Diminished breath sound  GI: Soft, nontender, non-distended, hyperactive bowel sound  MS: No edema; No deformity. Neuro:  Nonfocal  Psych: Normal affect   Labs    High Sensitivity Troponin:   Recent Labs  Lab 01/04/22 0416 01/04/22 0932 01/04/22 1145  TROPONINIHS 123* 128* 102*     Chemistry Recent Labs  Lab 01/06/22 0159 01/07/22 0140 01/08/22 0408 01/10/22 0723 01/11/22 0207 01/12/22 0622  NA 130* 127*   < > 133*  131* 135  K 3.6 3.3*   < > 4.0 3.9 4.1  CL 91* 89*   < > 100 100 103  CO2 28 27   < > '25 22 23  '$ GLUCOSE 122* 87   < > 96 97 93  BUN 20 21   < > '18 13 15  '$ CREATININE 0.85 0.88   < > 0.79 0.83 0.75  CALCIUM 9.6 8.7*   < > 8.2* 7.8* 8.9  MG  --  1.8  --   --   --   --   PROT 6.4* 5.7*  --   --   --   --   ALBUMIN 3.6 3.2*  --   --   --   --   AST 21 24  --   --   --   --   ALT 22 18  --   --   --   --   ALKPHOS 35* 31*  --   --   --   --   BILITOT 0.7 0.7  --   --   --   --   GFRNONAA >60 >60   < > >60 >60 >60  ANIONGAP 11 11   < > '8 9 9   '$ < > = values in this interval not displayed.     Hematology Recent Labs  Lab 01/10/22 0723 01/11/22 0207 01/12/22 0622  WBC 6.6 6.9 6.9  RBC 3.51* 3.29* 3.32*  HGB 12.1 11.4* 11.3*  HCT 33.8* 31.8* 32.8*  MCV 96.3 96.7 98.8  MCH 34.5* 34.7* 34.0  MCHC 35.8 35.8 34.5  RDW 11.8 11.9 11.9  PLT 272 251 289    BNP Recent Labs  Lab 01/11/22 0207  BNP 316.5*     Radiology    DG CHEST PORT 1 VIEW  Result Date: 01/10/2022 CLINICAL DATA:  Short of breath and chest pain today. EXAM: PORTABLE CHEST 1 VIEW COMPARISON:  01/04/2022 and older studies. FINDINGS: Large hiatal hernia. Cardiac silhouette is normal in size. No mediastinal or hilar masses. Mild reticular type opacities in the lung bases consistent with atelectasis. Lungs otherwise clear. Possible small effusions. No pneumothorax. Skeletal structures are grossly intact. IMPRESSION: 1. No acute cardiopulmonary disease. 2. Large hiatal hernia. Electronically Signed   By: Lajean Manes M.D.   On: 01/10/2022 13:00    Cardiac Studies   Echo 01/04/2022  1. Left ventricular ejection fraction, by estimation, is 40 to 45%. The  left ventricle has mildly decreased function. The left ventricle has no  regional wall motion abnormalities. Left ventricular diastolic parameters  are consistent with Grade I  diastolic dysfunction (impaired relaxation). Elevated left ventricular  end-diastolic  pressure.   2. Right ventricular systolic function is normal. The right ventricular  size is normal. There is normal pulmonary artery systolic pressure.   3. Left atrial size was moderately dilated.   4. The mitral valve is normal in structure. Trivial mitral valve  regurgitation. No evidence of mitral stenosis.   5. The aortic valve is tricuspid. There is mild calcification of the  aortic valve. There is mild thickening of the aortic valve. Aortic valve  regurgitation is trivial. No aortic stenosis is present.   6. The inferior vena cava is normal in size with greater than 50%  respiratory variability, suggesting right atrial pressure of 3 mmHg.   FINDINGS   Left Ventricle: Left ventricular ejection fraction, by estimation, is 40  to 45%. The left ventricle has mildly decreased function. The left  ventricle has no regional wall motion abnormalities. The left ventricular  internal cavity size was normal in  size. There is no left ventricular hypertrophy. Left ventricular diastolic  parameters are consistent with Grade I diastolic dysfunction (impaired  relaxation). Elevated left ventricular end-diastolic pressure.      LV Wall Scoring:  The entire septum, apical anterior segment, apical inferior segment, and  apex  are hypokinetic. The anterior wall, entire lateral wall, and inferior wall  are normal.    Right/ Left Cardiac Catheterization 01/09/2022: 1.  Normal right dominant coronary circulation with no obstructive coronary artery disease. 2.  Mean RA pressure of 1 mmHg, mean wedge pressure of 3 mmHg, cardiac output of 8.5 L/min and cardiac index of 5.1 L/min/m. 3.  LVEDP of 16 mmHg. 4.  Very tortuous right upper extremity that could not be navigated necessitating a femoral approach.   Recommendation: Goal-directed medical therapy.   Diagnostic Dominance: Right       Patient Profile     88 y.o. female with hx of hypertension, HFpEF, GERD, hypothyroidism with CHF  exacerbation and found to have EF of 40-45% (previously 60-65% in 2014) and new onset atrial flutter with  RVR.  Assessment & Plan    1.  Acute pulmonary edema 2.  Acute hypoxic respiratory 3.  Acute systolic heart failure -  BNP 514. chest x-ray suggestive of pneumonia. CT angio chest with small bilateral effusions.  - Initially required BiPAP.  Now weaned off. -Started on IV Lasix with improved breathing but stopped 9/5 due to hyponatremia -Echocardiogram showing LV function of 40 to 45% and grade 1 diastolic dysfunction.  Hypokinetic septum, apical anterior segment, apical inferior segment and apex. -She went for [facility] on 9/5 which showed normal coronaries with normal right sided filling pressures. LVEDP mildly elevated at 16 mmHg. - Currently lasix on hold. BNP 316 yesterday. Appears euvolemic. Diminished breath sound on exam. Net I & O -5.2L however not strictly recorded. Will give spirometry.  - Diuretics recommendation per MD   4.  Atrial fibrillation with rapid ventricular rate -Patient with longstanding history of irregular heart rate for 30 years but never found to have atrial fibrillation. -She was started on IV Cardizem but stopped due to hypotension.  Given IV amiodarone for 24 hours and stopped as patient declined. She was ultimately placed on Metoprolol and converted back to sinus rhythm on 9/3. - Continue BB - Continue Eliquis  5. PVCs/Ventricular bigeminy - Normal coronaries on cath - K > 4 today - Check MG - Continue BB  6.  Elevated troponin/ demand ischemia  -Peaked at 128.  -Cath with normal coronaries - Stopped ASA and Statin    7.  Pneumonia -Per primary team   8. Hyponatremia - Sodium dropped as low as 126 on 9/4. Improved to 145 with holding diuresis and giving IV fluids. However dropped to 131 yesterday. Now back to 135  Patient likes to go home. She will Ambulate. Try spirometry.  ? Symptoms due to underlying pneumonia initially.  Give PRN lasix '20mg'$ .    Continue BB and Eliquis.  Will arrange follow up.  Cardiology will sign off. Call with questions.   For questions or updates, please contact [place] Please consult [email or web address] for contact info under        SignedLeanor Kail, PA  01/12/2022, 9:08 AM     Patient seen and examined and agree with Robbie Lis, PA-C as detailed above.   In brief, the patient is a 88 year old female with history of HFpEF, HTN, hypothyroidism and GERD who presented with worsening dyspnea found to have acute on chronic HF exacerbation with mild drop in EF to 40-45% and new aflutter with RVR for which Cardiology has been consulted.    Underwent RHC/LHC 01/09/22 which showed normal coronaries, normal right sided filling pressures and mildly elevated LVEDP at 96mHg. She remains in NSR on the monitor.   Overall feels better today. BNP improved from admission and appears euvolemic on exam. Na improved today. No further work-up/interventions from CV perspective.    GEN: Elderly female, comfortable, NAD Neck: No JVD Cardiac: RRR, no murmurs, rubs, or gallops.  Respiratory: CTAB, better air movement GI: Soft, nontender, non-distended  MS: No edema; No deformity. Neuro:  Nonfocal  Psych: Normal affect     Plan: -Filling pressures on cath normal with only mildly elevated LVEDP -No CAD on cath -Appears euvolemic on exam; will plan for lasix '20mg'$  PO PRN on discharge for weight gain or LE edema (likely will not require) -Continue metop 12.'5mg'$  BID -Continue apixaban '5mg'$  BID -GDMT limited due to soft pressures during admission; will add as able as outpatient  Cardiology will sign-off.  Will arrange for CV follow-up as outpatient.   Gwyndolyn Kaufman, MD

## 2022-01-12 NOTE — TOC Progression Note (Signed)
Transition of Care [facility]) - Progression Note    Patient Details  Name: Ellen Chandler MRN: [id number] Date of Birth: 09-Jan-1931  Transition of Care [facility]) CM/SW [place], [place] Phone Number: 01/12/2022, 12:03 PM  Clinical Narrative:     Patient's DIL choose [facility] for short tern rehab.  [facility] confirmed availability  TOC will continue to follow and assist with discharge planning.    Expected Discharge Plan: Skilled Nursing Facility Barriers to Discharge:  (awaiting SNF choice from family)  Expected Discharge Plan and Services Expected Discharge Plan: [place] In-house Referral: Clinical Social Work     Living arrangements for the past 2 months: [place]                                       Social Determinants of Health (SDOH) Interventions    Readmission Risk Interventions     No data to display

## 2022-01-12 NOTE — TOC Transition Note (Signed)
Transition of Care [facility]) - CM/SW Discharge Note   Patient Details  Name: NIDYA BOUYER MRN: [id number] Date of Birth: 10-Dec-1930  Transition of Care [facility]) CM/SW Contact:  Vinie Sill, LCSW Phone Number: 01/12/2022, 2:37 PM   Clinical Narrative:     Patient will Discharge to: [place] Discharge Date: 01/12/2022 Family Notified: martha, daughter in law Transport By: Family  Per MD patient is ready for discharge. RN, patient, and facility notified of discharge. Discharge Summary sent to facility. RN given number for [phone] Room 103. Ambulance transport requested for patient.   Clinical Social Worker signing off.  Thurmond Butts, MSW, LCSW Clinical Social Worker     Final next level of care: Skilled Nursing Facility Barriers to Discharge: Barriers Resolved   Patient Goals and CMS Choice Patient states their goals for this hospitalization and ongoing recovery are:: to go rehab CMS Medicare.gov Compare Post Acute Care list provided to:: Patient Choice offered to / list presented to : Patient  Discharge Placement              Patient chooses bed at: [facility] Patient to be transferred to facility by: family Name of family member notified: martha Patient and family notified of of transfer: 01/12/22  Discharge Plan and Services In-house Referral: Clinical Social Work                                   Social Determinants of Health ([place]) Interventions     Readmission Risk Interventions     No data to display

## 2022-01-15 DIAGNOSIS — I214 Non-ST elevation (NSTEMI) myocardial infarction: Secondary | ICD-10-CM | POA: Diagnosis not present

## 2022-01-15 DIAGNOSIS — J9600 Acute respiratory failure, unspecified whether with hypoxia or hypercapnia: Secondary | ICD-10-CM | POA: Diagnosis not present

## 2022-01-15 DIAGNOSIS — M6281 Muscle weakness (generalized): Secondary | ICD-10-CM | POA: Diagnosis not present

## 2022-01-15 DIAGNOSIS — I5032 Chronic diastolic (congestive) heart failure: Secondary | ICD-10-CM | POA: Diagnosis not present

## 2022-01-15 DIAGNOSIS — F10130 Alcohol abuse with withdrawal, uncomplicated: Secondary | ICD-10-CM | POA: Diagnosis not present

## 2022-01-15 DIAGNOSIS — I10 Essential (primary) hypertension: Secondary | ICD-10-CM | POA: Diagnosis not present

## 2022-01-15 DIAGNOSIS — E038 Other specified hypothyroidism: Secondary | ICD-10-CM | POA: Diagnosis not present

## 2022-01-15 DIAGNOSIS — K2100 Gastro-esophageal reflux disease with esophagitis, without bleeding: Secondary | ICD-10-CM | POA: Diagnosis not present

## 2022-01-15 DIAGNOSIS — Z811 Family history of alcohol abuse and dependence: Secondary | ICD-10-CM | POA: Diagnosis not present

## 2022-01-16 DIAGNOSIS — K2100 Gastro-esophageal reflux disease with esophagitis, without bleeding: Secondary | ICD-10-CM | POA: Diagnosis not present

## 2022-01-16 DIAGNOSIS — E038 Other specified hypothyroidism: Secondary | ICD-10-CM | POA: Diagnosis not present

## 2022-01-16 DIAGNOSIS — I214 Non-ST elevation (NSTEMI) myocardial infarction: Secondary | ICD-10-CM | POA: Diagnosis not present

## 2022-01-16 DIAGNOSIS — J9600 Acute respiratory failure, unspecified whether with hypoxia or hypercapnia: Secondary | ICD-10-CM | POA: Diagnosis not present

## 2022-01-16 DIAGNOSIS — I5032 Chronic diastolic (congestive) heart failure: Secondary | ICD-10-CM | POA: Diagnosis not present

## 2022-01-16 DIAGNOSIS — M6281 Muscle weakness (generalized): Secondary | ICD-10-CM | POA: Diagnosis not present

## 2022-01-16 DIAGNOSIS — F1011 Alcohol abuse, in remission: Secondary | ICD-10-CM | POA: Diagnosis not present

## 2022-01-16 DIAGNOSIS — F1019 Alcohol abuse with unspecified alcohol-induced disorder: Secondary | ICD-10-CM | POA: Diagnosis not present

## 2022-01-16 DIAGNOSIS — F1010 Alcohol abuse, uncomplicated: Secondary | ICD-10-CM | POA: Diagnosis not present

## 2022-01-16 DIAGNOSIS — I10 Essential (primary) hypertension: Secondary | ICD-10-CM | POA: Diagnosis not present

## 2022-01-17 DIAGNOSIS — Z811 Family history of alcohol abuse and dependence: Secondary | ICD-10-CM | POA: Diagnosis not present

## 2022-01-17 DIAGNOSIS — I5032 Chronic diastolic (congestive) heart failure: Secondary | ICD-10-CM | POA: Diagnosis not present

## 2022-01-17 DIAGNOSIS — J9600 Acute respiratory failure, unspecified whether with hypoxia or hypercapnia: Secondary | ICD-10-CM | POA: Diagnosis not present

## 2022-01-17 DIAGNOSIS — I214 Non-ST elevation (NSTEMI) myocardial infarction: Secondary | ICD-10-CM | POA: Diagnosis not present

## 2022-01-17 DIAGNOSIS — K2100 Gastro-esophageal reflux disease with esophagitis, without bleeding: Secondary | ICD-10-CM | POA: Diagnosis not present

## 2022-01-17 DIAGNOSIS — M6281 Muscle weakness (generalized): Secondary | ICD-10-CM | POA: Diagnosis not present

## 2022-01-17 DIAGNOSIS — I10 Essential (primary) hypertension: Secondary | ICD-10-CM | POA: Diagnosis not present

## 2022-01-17 DIAGNOSIS — I5043 Acute on chronic combined systolic (congestive) and diastolic (congestive) heart failure: Secondary | ICD-10-CM | POA: Insufficient documentation

## 2022-01-17 DIAGNOSIS — E038 Other specified hypothyroidism: Secondary | ICD-10-CM | POA: Diagnosis not present

## 2022-01-17 DIAGNOSIS — F10130 Alcohol abuse with withdrawal, uncomplicated: Secondary | ICD-10-CM | POA: Diagnosis not present

## 2022-01-17 NOTE — Progress Notes (Deleted)
Cardiology Office Note   Date:  01/17/2022   ID:  Ellen Chandler, DOB 1931-01-18, MRN [id number]  PCP:  Deland Pretty, MD  Cardiologist:   Mertie Moores, MD Referring:  ***  No chief complaint on file.     History of Present Illness: Ellen Chandler is a 88 y.o. female who presents for hospital follow up.  She had been in acute respiratory failure.  She had a normal cath.  She required BiPAP.  It was not clear that she had volume overload.   Echo demonstrated an EF of 40% with global hypokinesis.  ***     Acute respiratory failure with hypoxia (HCC) DDx = CAP vs COVID vs CHF vs MI. CXR looks like CAP No fever, no WBC. COVID and flu pending BNP elevation + rapid improvement on BIPAP (weaned off of BIPAP to 2L via [place] now) initially suspicious for CHF. -Pt now s/p normal heart cath -Discussed with Cardiology, symptoms likely not related to vol overload.  -Cardiology recs for metoprolol 12.'5mg'$  bid and eliquis '5mg'$  bid and to cont lasix '20mg'$  on daily PRN basis for edema or wt gain   Chronic diastolic CHF (congestive heart failure) ([place]) Unclear if acute decompensation today causing SOB presentation: CXR looks more like PNA per radiologist though BNP elevated, no fever, no WBC. -No gross vol overload at this time.    History of ETOH abuse Pt with history of EtOH use/abuse, ED visit(s) noted for intoxication within the past 1 yr. -On CIWA protocol   Essential hypertension BP on soft side in ED. Held amlodipine for the moment. BP remained stable          Past Medical History:  Diagnosis Date   Abnormal ECG    a. Pt aware of long hx of abnl ecg->h/o normal stress testing in [place] ~ 2000.   Diastolic dysfunction    a. 04/2013 Echo: EF 60-65%, No rwma, Gr 1 DD, mild MR.   GERD (gastroesophageal reflux disease)    Hypertension    Hypothyroidism     Past Surgical History:  Procedure Laterality Date   ABDOMINAL HYSTERECTOMY     JOINT REPLACEMENT     a.  knee left - injury resulted after being attacked by a dog.   LEFT HEART CATH AND CORONARY ANGIOGRAPHY N/A 01/05/2022   Procedure: LEFT HEART CATH AND CORONARY ANGIOGRAPHY;  Surgeon: Belva Crome, MD;  Location: [place];  Service: Cardiovascular;  Laterality: N/A;   RIGHT/LEFT HEART CATH AND CORONARY ANGIOGRAPHY N/A 01/09/2022   Procedure: RIGHT/LEFT HEART CATH AND CORONARY ANGIOGRAPHY;  Surgeon: Early Osmond, MD;  Location: [place] [facility];  Service: Cardiovascular;  Laterality: N/A;     Current Outpatient Medications  Medication Sig Dispense Refill   acetaminophen (TYLENOL) 500 MG tablet Take 1,000 mg by mouth every 6 (six) hours as needed for moderate pain.     apixaban (ELIQUIS) 5 MG TABS tablet Take 1 tablet (5 mg total) by mouth 2 (two) times daily. 60 tablet 0   furosemide (LASIX) 20 MG tablet Take one tab daily as needed for fluid or edema 30 tablet 0   lactose free nutrition (BOOST) LIQD Take 237 mLs by mouth at bedtime.     levothyroxine (SYNTHROID, LEVOTHROID) 88 MCG tablet Take 88 mcg by mouth daily before breakfast.     metoprolol succinate (TOPROL XL) 25 MG 24 hr tablet Take 1 tablet (25 mg total) by mouth daily. 30 tablet 2  Multiple Vitamin (MULTIVITAMIN WITH MINERALS) TABS tablet Take 1 tablet by mouth daily.     MYRBETRIQ 50 MG TB24 tablet Take 50 mg by mouth daily.     pantoprazole (PROTONIX) 40 MG tablet Take 40 mg by mouth 2 (two) times daily.     No current facility-administered medications for this visit.    Allergies:   Patient has no known allergies.    Social History:  The patient  reports that she has never smoked. She has never used smokeless tobacco. She reports current alcohol use. She reports that she does not use drugs.   Family History:  The patient's ***family history includes Breast cancer in her sister; Diabetes type II in her father; Heart attack in her mother.    ROS:  Please see the history of present illness.   Otherwise, review  of systems are positive for {NONE DEFAULTED:18576}.   All other systems are reviewed and negative.    PHYSICAL EXAM: VS:  There were no vitals taken for this visit. , BMI There is no height or weight on file to calculate BMI. GENERAL:  Well appearing HEENT:  Pupils equal round and reactive, fundi not visualized, oral mucosa unremarkable NECK:  No jugular venous distention, waveform within normal limits, carotid upstroke brisk and symmetric, no bruits, no thyromegaly LYMPHATICS:  No cervical, inguinal adenopathy LUNGS:  Clear to auscultation bilaterally BACK:  No CVA tenderness CHEST:  Unremarkable HEART:  PMI not displaced or sustained,S1 and S2 within normal limits, no S3, no S4, no clicks, no rubs, *** murmurs ABD:  Flat, positive bowel sounds normal in frequency in pitch, no bruits, no rebound, no guarding, no midline pulsatile mass, no hepatomegaly, no splenomegaly EXT:  2 plus pulses throughout, no edema, no cyanosis no clubbing SKIN:  No rashes no nodules NEURO:  Cranial nerves II through XII grossly intact, motor grossly intact throughout PSYCH:  Cognitively intact, oriented to person place and time    EKG:  EKG {ACTION; IS/IS PJA:[id number]} ordered today. The ekg ordered today demonstrates ***   Recent Labs: 01/07/2022: ALT 18 01/11/2022: B Natriuretic Peptide 316.5 01/12/2022: BUN 15; Creatinine, Ser 0.75; Hemoglobin 11.3; Magnesium 2.2; Platelets 289; Potassium 4.1; Sodium 135    Lipid Panel    Component Value Date/Time   CHOL 121 01/05/2022 0434   TRIG 56 01/05/2022 0434   HDL 40 (L) 01/05/2022 0434   CHOLHDL 3.0 01/05/2022 0434   VLDL 11 01/05/2022 0434   LDLCALC 70 01/05/2022 0434      Wt Readings from Last 3 Encounters:  01/12/22 140 lb 3.4 oz (63.6 kg)  06/17/21 135 lb (61.2 kg)  04/28/20 149 lb 14.6 oz (68 kg)      Other studies Reviewed: Additional studies/ records that were reviewed today include: ***. Review of the above records demonstrates:  Please  see elsewhere in the note.  ***   ASSESSMENT AND PLAN:  Acute respiratory failure:  ***  Acute systolic and diastolic HF:  ***   Current medicines are reviewed at length with the patient today.  The patient {ACTIONS; HAS/DOES NOT HAVE:19233} concerns regarding medicines.  The following changes have been made:  {PLAN; NO CHANGE:13088:s}  Labs/ tests ordered today include: *** No orders of the defined types were placed in this encounter.    Disposition:   FU with ***    Signed, Minus Breeding, MD  01/17/2022 7:49 PM    [place]

## 2022-01-18 ENCOUNTER — Ambulatory Visit: Payer: PPO | Admitting: Cardiology

## 2022-01-18 DIAGNOSIS — I10 Essential (primary) hypertension: Secondary | ICD-10-CM

## 2022-01-18 DIAGNOSIS — I5043 Acute on chronic combined systolic (congestive) and diastolic (congestive) heart failure: Secondary | ICD-10-CM

## 2022-01-18 DIAGNOSIS — J9690 Respiratory failure, unspecified, unspecified whether with hypoxia or hypercapnia: Secondary | ICD-10-CM

## 2022-01-19 DIAGNOSIS — F10130 Alcohol abuse with withdrawal, uncomplicated: Secondary | ICD-10-CM | POA: Diagnosis not present

## 2022-01-19 DIAGNOSIS — K2100 Gastro-esophageal reflux disease with esophagitis, without bleeding: Secondary | ICD-10-CM | POA: Diagnosis not present

## 2022-01-19 DIAGNOSIS — I5032 Chronic diastolic (congestive) heart failure: Secondary | ICD-10-CM | POA: Diagnosis not present

## 2022-01-19 DIAGNOSIS — I10 Essential (primary) hypertension: Secondary | ICD-10-CM | POA: Diagnosis not present

## 2022-01-19 DIAGNOSIS — M6281 Muscle weakness (generalized): Secondary | ICD-10-CM | POA: Diagnosis not present

## 2022-01-19 DIAGNOSIS — I214 Non-ST elevation (NSTEMI) myocardial infarction: Secondary | ICD-10-CM | POA: Diagnosis not present

## 2022-01-19 DIAGNOSIS — Z811 Family history of alcohol abuse and dependence: Secondary | ICD-10-CM | POA: Diagnosis not present

## 2022-01-19 DIAGNOSIS — F1010 Alcohol abuse, uncomplicated: Secondary | ICD-10-CM | POA: Diagnosis not present

## 2022-01-19 DIAGNOSIS — F1011 Alcohol abuse, in remission: Secondary | ICD-10-CM | POA: Diagnosis not present

## 2022-01-19 DIAGNOSIS — J9600 Acute respiratory failure, unspecified whether with hypoxia or hypercapnia: Secondary | ICD-10-CM | POA: Diagnosis not present

## 2022-01-19 DIAGNOSIS — E038 Other specified hypothyroidism: Secondary | ICD-10-CM | POA: Diagnosis not present

## 2022-01-22 DIAGNOSIS — I10 Essential (primary) hypertension: Secondary | ICD-10-CM | POA: Diagnosis not present

## 2022-01-22 DIAGNOSIS — F10130 Alcohol abuse with withdrawal, uncomplicated: Secondary | ICD-10-CM | POA: Diagnosis not present

## 2022-01-22 DIAGNOSIS — K2100 Gastro-esophageal reflux disease with esophagitis, without bleeding: Secondary | ICD-10-CM | POA: Diagnosis not present

## 2022-01-22 DIAGNOSIS — I5032 Chronic diastolic (congestive) heart failure: Secondary | ICD-10-CM | POA: Diagnosis not present

## 2022-01-22 DIAGNOSIS — M6281 Muscle weakness (generalized): Secondary | ICD-10-CM | POA: Diagnosis not present

## 2022-01-22 DIAGNOSIS — E038 Other specified hypothyroidism: Secondary | ICD-10-CM | POA: Diagnosis not present

## 2022-01-22 DIAGNOSIS — I214 Non-ST elevation (NSTEMI) myocardial infarction: Secondary | ICD-10-CM | POA: Diagnosis not present

## 2022-01-22 DIAGNOSIS — J9600 Acute respiratory failure, unspecified whether with hypoxia or hypercapnia: Secondary | ICD-10-CM | POA: Diagnosis not present

## 2022-01-23 DIAGNOSIS — E871 Hypo-osmolality and hyponatremia: Secondary | ICD-10-CM | POA: Diagnosis not present

## 2022-01-23 DIAGNOSIS — I5032 Chronic diastolic (congestive) heart failure: Secondary | ICD-10-CM | POA: Diagnosis not present

## 2022-01-23 DIAGNOSIS — I10 Essential (primary) hypertension: Secondary | ICD-10-CM | POA: Diagnosis not present

## 2022-01-23 DIAGNOSIS — I480 Paroxysmal atrial fibrillation: Secondary | ICD-10-CM | POA: Diagnosis not present

## 2022-02-03 ENCOUNTER — Other Ambulatory Visit: Payer: Self-pay

## 2022-02-03 ENCOUNTER — Emergency Department (HOSPITAL_COMMUNITY): Payer: PPO

## 2022-02-03 ENCOUNTER — Inpatient Hospital Stay (HOSPITAL_COMMUNITY)
Admission: EM | Admit: 2022-02-03 | Discharge: 2022-02-07 | DRG: 291 | Disposition: A | Discharge status: Home Health | Payer: PPO | Attending: Internal Medicine | Admitting: Internal Medicine

## 2022-02-03 ENCOUNTER — Encounter (HOSPITAL_COMMUNITY): Payer: Self-pay | Admitting: *Deleted

## 2022-02-03 DIAGNOSIS — I4892 Unspecified atrial flutter: Secondary | ICD-10-CM | POA: Diagnosis present

## 2022-02-03 DIAGNOSIS — I2510 Atherosclerotic heart disease of native coronary artery without angina pectoris: Secondary | ICD-10-CM | POA: Diagnosis present

## 2022-02-03 DIAGNOSIS — Z8249 Family history of ischemic heart disease and other diseases of the circulatory system: Secondary | ICD-10-CM | POA: Diagnosis not present

## 2022-02-03 DIAGNOSIS — Z66 Do not resuscitate: Secondary | ICD-10-CM | POA: Diagnosis not present

## 2022-02-03 DIAGNOSIS — I5043 Acute on chronic combined systolic (congestive) and diastolic (congestive) heart failure: Secondary | ICD-10-CM | POA: Diagnosis present

## 2022-02-03 DIAGNOSIS — E039 Hypothyroidism, unspecified: Secondary | ICD-10-CM | POA: Diagnosis present

## 2022-02-03 DIAGNOSIS — Z833 Family history of diabetes mellitus: Secondary | ICD-10-CM

## 2022-02-03 DIAGNOSIS — J810 Acute pulmonary edema: Principal | ICD-10-CM | POA: Diagnosis present

## 2022-02-03 DIAGNOSIS — E871 Hypo-osmolality and hyponatremia: Secondary | ICD-10-CM | POA: Diagnosis not present

## 2022-02-03 DIAGNOSIS — I480 Paroxysmal atrial fibrillation: Secondary | ICD-10-CM | POA: Diagnosis not present

## 2022-02-03 DIAGNOSIS — Z7901 Long term (current) use of anticoagulants: Secondary | ICD-10-CM | POA: Diagnosis not present

## 2022-02-03 DIAGNOSIS — N179 Acute kidney failure, unspecified: Secondary | ICD-10-CM | POA: Diagnosis not present

## 2022-02-03 DIAGNOSIS — J9601 Acute respiratory failure with hypoxia: Secondary | ICD-10-CM | POA: Diagnosis present

## 2022-02-03 DIAGNOSIS — I110 Hypertensive heart disease with heart failure: Principal | ICD-10-CM | POA: Diagnosis present

## 2022-02-03 DIAGNOSIS — Z79890 Hormone replacement therapy (postmenopausal): Secondary | ICD-10-CM

## 2022-02-03 DIAGNOSIS — I10 Essential (primary) hypertension: Secondary | ICD-10-CM | POA: Diagnosis not present

## 2022-02-03 DIAGNOSIS — I952 Hypotension due to drugs: Secondary | ICD-10-CM | POA: Diagnosis not present

## 2022-02-03 DIAGNOSIS — R0902 Hypoxemia: Secondary | ICD-10-CM | POA: Diagnosis not present

## 2022-02-03 DIAGNOSIS — I509 Heart failure, unspecified: Secondary | ICD-10-CM | POA: Diagnosis not present

## 2022-02-03 DIAGNOSIS — I959 Hypotension, unspecified: Secondary | ICD-10-CM | POA: Diagnosis not present

## 2022-02-03 DIAGNOSIS — K219 Gastro-esophageal reflux disease without esophagitis: Secondary | ICD-10-CM | POA: Diagnosis present

## 2022-02-03 DIAGNOSIS — R079 Chest pain, unspecified: Secondary | ICD-10-CM | POA: Diagnosis not present

## 2022-02-03 DIAGNOSIS — Z96652 Presence of left artificial knee joint: Secondary | ICD-10-CM | POA: Diagnosis present

## 2022-02-03 DIAGNOSIS — R61 Generalized hyperhidrosis: Secondary | ICD-10-CM | POA: Diagnosis not present

## 2022-02-03 DIAGNOSIS — E876 Hypokalemia: Secondary | ICD-10-CM | POA: Diagnosis present

## 2022-02-03 DIAGNOSIS — Z79899 Other long term (current) drug therapy: Secondary | ICD-10-CM

## 2022-02-03 DIAGNOSIS — T502X5A Adverse effect of carbonic-anhydrase inhibitors, benzothiadiazides and other diuretics, initial encounter: Secondary | ICD-10-CM | POA: Diagnosis not present

## 2022-02-03 DIAGNOSIS — Z803 Family history of malignant neoplasm of breast: Secondary | ICD-10-CM | POA: Diagnosis not present

## 2022-02-03 DIAGNOSIS — R0602 Shortness of breath: Secondary | ICD-10-CM | POA: Diagnosis present

## 2022-02-03 DIAGNOSIS — K449 Diaphragmatic hernia without obstruction or gangrene: Secondary | ICD-10-CM | POA: Diagnosis not present

## 2022-02-03 DIAGNOSIS — R072 Precordial pain: Secondary | ICD-10-CM | POA: Diagnosis not present

## 2022-02-03 DIAGNOSIS — Z1152 Encounter for screening for COVID-19: Secondary | ICD-10-CM | POA: Diagnosis not present

## 2022-02-03 DIAGNOSIS — R55 Syncope and collapse: Secondary | ICD-10-CM | POA: Diagnosis not present

## 2022-02-03 LAB — BRAIN NATRIURETIC PEPTIDE: B Natriuretic Peptide: 884.1 pg/mL — ABNORMAL HIGH (ref 0.0–100.0)

## 2022-02-03 LAB — COMPREHENSIVE METABOLIC PANEL
ALT: 14 U/L (ref 0–44)
AST: 30 U/L (ref 15–41)
Albumin: 3.5 g/dL (ref 3.5–5.0)
Alkaline Phosphatase: 28 U/L — ABNORMAL LOW (ref 38–126)
Anion gap: 9 (ref 5–15)
BUN: 19 mg/dL (ref 8–23)
CO2: 24 mmol/L (ref 22–32)
Calcium: 9.1 mg/dL (ref 8.9–10.3)
Chloride: 103 mmol/L (ref 98–111)
Creatinine, Ser: 0.9 mg/dL (ref 0.44–1.00)
GFR, Estimated: >60 mL/min (ref >60)
Glucose, Bld: 146 mg/dL — ABNORMAL HIGH (ref 70–99)
Potassium: 4.7 mmol/L (ref 3.5–5.1)
Sodium: 136 mmol/L (ref 135–145)
Total Bilirubin: 0.9 mg/dL (ref 0.3–1.2)
Total Protein: 6.4 g/dL — ABNORMAL LOW (ref 6.5–8.1)

## 2022-02-03 LAB — CBC WITH DIFFERENTIAL/PLATELET
Abs Immature Granulocytes: 0.03 10*3/uL (ref 0.00–0.07)
Basophils Absolute: 0.1 10*3/uL (ref 0.0–0.1)
Basophils Relative: 1 %
Eosinophils Absolute: 0.4 10*3/uL (ref 0.0–0.5)
Eosinophils Relative: 4 %
HCT: 38.9 % (ref 36.0–46.0)
Hemoglobin: 13.2 g/dL (ref 12.0–15.0)
Immature Granulocytes: 0 %
Lymphocytes Relative: 38 %
Lymphs Abs: 3.6 10*3/uL (ref 0.7–4.0)
MCH: 33.8 pg (ref 26.0–34.0)
MCHC: 33.9 g/dL (ref 30.0–36.0)
MCV: 99.5 fL (ref 80.0–100.0)
Monocytes Absolute: 1 10*3/uL (ref 0.1–1.0)
Monocytes Relative: 10 %
Neutro Abs: 4.6 10*3/uL (ref 1.7–7.7)
Neutrophils Relative %: 47 %
Platelets: 308 10*3/uL (ref 150–400)
RBC: 3.91 MIL/uL (ref 3.87–5.11)
RDW: 12 % (ref 11.5–15.5)
WBC: 9.6 10*3/uL (ref 4.0–10.5)
nRBC: 0 % (ref 0.0–0.2)

## 2022-02-03 LAB — TROPONIN I (HIGH SENSITIVITY)
Troponin I (High Sensitivity): 13 ng/L (ref <18)
Troponin I (High Sensitivity): 173 ng/L (ref <18)
Troponin I (High Sensitivity): 279 ng/L (ref <18)
Troponin I (High Sensitivity): 238 ng/L (ref <18)

## 2022-02-03 LAB — SARS CORONAVIRUS 2 BY RT PCR: SARS Coronavirus 2 by RT PCR: NEGATIVE

## 2022-02-03 MED ORDER — BOOST PO LIQD
237.0000 mL | Freq: Every day | ORAL | Status: DC
  Administered 2022-02-03 – 2022-02-06 (×3): 237 mL via ORAL
  Filled 2022-02-03 (×5): qty 237

## 2022-02-03 MED ORDER — METOPROLOL TARTRATE 5 MG/5ML IV SOLN
5.0000 mg | Freq: Four times a day (QID) | INTRAVENOUS | Status: DC | PRN
  Administered 2022-02-03: 5 mg via INTRAVENOUS
  Filled 2022-02-03: qty 5

## 2022-02-03 MED ORDER — LEVOTHYROXINE SODIUM 88 MCG PO TABS
88.0000 ug | ORAL_TABLET | Freq: Every day | ORAL | Status: DC
  Administered 2022-02-04 – 2022-02-07 (×4): 88 ug via ORAL
  Filled 2022-02-03 (×4): qty 1

## 2022-02-03 MED ORDER — FUROSEMIDE 10 MG/ML IJ SOLN
40.0000 mg | Freq: Two times a day (BID) | INTRAMUSCULAR | Status: DC
  Administered 2022-02-03 – 2022-02-04 (×2): 40 mg via INTRAVENOUS
  Filled 2022-02-03 (×2): qty 4

## 2022-02-03 MED ORDER — MIRABEGRON ER 50 MG PO TB24
50.0000 mg | ORAL_TABLET | Freq: Every day | ORAL | Status: DC
  Administered 2022-02-03 – 2022-02-07 (×5): 50 mg via ORAL
  Filled 2022-02-03 (×6): qty 1

## 2022-02-03 MED ORDER — PANTOPRAZOLE SODIUM 40 MG PO TBEC
40.0000 mg | DELAYED_RELEASE_TABLET | Freq: Every day | ORAL | Status: DC
  Administered 2022-02-03 – 2022-02-07 (×5): 40 mg via ORAL
  Filled 2022-02-03 (×5): qty 1

## 2022-02-03 MED ORDER — FUROSEMIDE 10 MG/ML IJ SOLN
40.0000 mg | Freq: Once | INTRAMUSCULAR | Status: AC
  Administered 2022-02-03: 40 mg via INTRAVENOUS
  Filled 2022-02-03: qty 4

## 2022-02-03 MED ORDER — ONDANSETRON HCL 4 MG/2ML IJ SOLN: 4.0000 mg | Freq: Four times a day (QID) | INTRAMUSCULAR | Status: DC | PRN

## 2022-02-03 MED ORDER — METOPROLOL TARTRATE 25 MG PO TABS
25.0000 mg | ORAL_TABLET | Freq: Two times a day (BID) | ORAL | Status: DC
  Administered 2022-02-03 – 2022-02-07 (×8): 25 mg via ORAL
  Filled 2022-02-03 (×9): qty 1

## 2022-02-03 MED ORDER — APIXABAN 5 MG PO TABS
5.0000 mg | ORAL_TABLET | Freq: Two times a day (BID) | ORAL | Status: DC
  Administered 2022-02-03 – 2022-02-07 (×9): 5 mg via ORAL
  Filled 2022-02-03 (×9): qty 1

## 2022-02-03 MED ORDER — ADULT MULTIVITAMIN W/MINERALS CH
1.0000 | ORAL_TABLET | Freq: Every day | ORAL | Status: DC
  Administered 2022-02-03 – 2022-02-07 (×5): 1 via ORAL
  Filled 2022-02-03 (×5): qty 1

## 2022-02-03 MED ORDER — ONDANSETRON HCL 4 MG PO TABS: 4.0000 mg | ORAL_TABLET | Freq: Four times a day (QID) | ORAL | Status: DC | PRN

## 2022-02-03 MED ORDER — ORAL CARE MOUTH RINSE: 15.0000 mL | OROMUCOSAL | Status: DC | PRN

## 2022-02-03 NOTE — Progress Notes (Signed)
As RT walked in room Pt was stating she wanted to come off BiPAP. RT placed Pt on 3L [place] and is currently stable. Pt states breathing is a lot better. BiPAP on standby if needed.RN aware.

## 2022-02-03 NOTE — ED Triage Notes (Signed)
The pt [facility] living [facility] diff brfeathing all day worse tonight  the pt arrived on c-pap alert responsive having a difficult time breathing  resp therapy  attached to bi-pap on arrival

## 2022-02-03 NOTE — Assessment & Plan Note (Addendum)
Patient is on Lopressor 25 mg BID as an outpatient. -Lopressor 25 mg BID held secondary to hypotension after initiation of Cardizem drip

## 2022-02-03 NOTE — Assessment & Plan Note (Addendum)
Echocardiogram with mild reduction in LV systolic function 40 to 16%, RV systolic function preserved, moderate dilatation of LA. Left ventricle wall with entire septum, apical anterior segment, apical inferior segment, and apex are hypokinetic. No significant valvular disease.   Elevation in troponin due to demand ischemia, with ruled out NSTEMI.   Patient was placed on furosemide for diuresis, negative fluid balance was achieved, -2.976 ml, with improvement in her symptoms.  Patient loss about 1 kg weight.   Acute hypoxemic respiratory failure, due to acute cardiogenic pulmonary edema. Clinically improved, at the time of her discharge her 02 saturation is 90% on room air.   Patient will continue heart failure management with metoprolol and rate control atrial fibrillation with amiodarone.  Continue diuretic therapy with furosemide as needed.  Add SGLT 2 inh and follow up as outpatient.

## 2022-02-03 NOTE — Assessment & Plan Note (Signed)
Symptoms present during respiratory distress. Troponin elevated and likely secondary to demand ischemia from above. Recent cardiac catheterization without evidence of obstructive CAD. Symptoms have resolved. -Trend troponin until peak

## 2022-02-03 NOTE — Assessment & Plan Note (Addendum)
See problems, Acute on chronic combined systolic and diastolic CHF (congestive heart failure) ([place]) and Acute respiratory failure with hypoxia ([place]).

## 2022-02-03 NOTE — Assessment & Plan Note (Signed)
Secondary to acute heart failure. Patient required BiPAP for management with subsequent improvement of symptoms after diuresis. Weaning quickly off of oxygen. -Wean oxygen to room air

## 2022-02-03 NOTE — Plan of Care (Signed)
  Problem: Education: Goal: Understanding of CV disease, CV risk reduction, and recovery process will improve Outcome: Progressing Goal: Individualized Educational Video(s) Outcome: Progressing   Problem: Activity: Goal: Ability to return to baseline activity level will improve Outcome: Progressing   Problem: Cardiovascular: Goal: Ability to achieve and maintain adequate cardiovascular perfusion will improve Outcome: Progressing Goal: Vascular access site(s) Level 0-1 will be maintained Outcome: Progressing   Problem: Health Behavior/Discharge Planning: Goal: Ability to safely manage health-related needs after discharge will improve Outcome: Progressing   Problem: Education: Goal: Ability to demonstrate management of disease process will improve Outcome: Progressing Goal: Ability to verbalize understanding of medication therapies will improve Outcome: Progressing Goal: Individualized Educational Video(s) Outcome: Progressing   Problem: Activity: Goal: Capacity to carry out activities will improve Outcome: Progressing   Problem: Cardiac: Goal: Ability to achieve and maintain adequate cardiopulmonary perfusion will improve Outcome: Progressing   Problem: Education: Goal: Knowledge of General Education information will improve Description: Including pain rating scale, medication(s)/side effects and non-pharmacologic comfort measures Outcome: Progressing   Problem: Health Behavior/Discharge Planning: Goal: Ability to manage health-related needs will improve Outcome: Progressing   Problem: Clinical Measurements: Goal: Ability to maintain clinical measurements within normal limits will improve Outcome: Progressing Goal: Will remain free from infection Outcome: Progressing Goal: Diagnostic test results will improve Outcome: Progressing Goal: Respiratory complications will improve Outcome: Progressing Goal: Cardiovascular complication will be avoided Outcome:  Progressing   Problem: Activity: Goal: Risk for activity intolerance will decrease Outcome: Progressing   Problem: Nutrition: Goal: Adequate nutrition will be maintained Outcome: Progressing   Problem: Coping: Goal: Level of anxiety will decrease Outcome: Progressing   Problem: Elimination: Goal: Will not experience complications related to bowel motility Outcome: Progressing Goal: Will not experience complications related to urinary retention Outcome: Progressing   Problem: Pain Managment: Goal: General experience of comfort will improve Outcome: Progressing   Problem: Safety: Goal: Ability to remain free from injury will improve Outcome: Progressing   Problem: Skin Integrity: Goal: Risk for impaired skin integrity will decrease Outcome: Progressing

## 2022-02-03 NOTE — Assessment & Plan Note (Signed)
Patient is on Synthroid 88 mcg daily as an outpatient. -Continue Synthroid 88 mcg daily

## 2022-02-03 NOTE — H&P (Signed)
History and Physical    Patient: Ellen Chandler HBZ:[id number] DOB: 04/24/1931 DOA: 02/03/2022 DOS: the patient was seen and examined on 02/03/2022 PCP: Deland Pretty, MD  Patient coming from: Home  Chief Complaint:  Chief Complaint  Patient presents with   Shortness of Breath   HPI: Ellen Chandler is a 87 y.o. female with medical history significant of heart failure, GERD, hypertension, hypothyroidism . Patient presented secondary to quick progressively worsening dypnea. Symptoms started about about one night. Patient reports significant dyspnea when ambulating from her room to her bathroom. She reports associated substernal chest pain without radiation with her significant dyspnea. No nausea or vomiting. Mild non-productive cough.  Review of Systems: As mentioned in the history of present illness. All other systems reviewed and are negative. Past Medical History:  Diagnosis Date   Abnormal ECG    a. Pt aware of long hx of abnl ecg->h/o normal stress testing in [place] ~ 2000.   Diastolic dysfunction    a. 04/2013 Echo: EF 60-65%, No rwma, Gr 1 DD, mild MR.   GERD (gastroesophageal reflux disease)    Hypertension    Hypothyroidism    Past Surgical History:  Procedure Laterality Date   ABDOMINAL HYSTERECTOMY     JOINT REPLACEMENT     a. knee left - injury resulted after being attacked by a dog.   LEFT HEART CATH AND CORONARY ANGIOGRAPHY N/A 01/05/2022   Procedure: LEFT HEART CATH AND CORONARY ANGIOGRAPHY;  Surgeon: Belva Crome, MD;  Location: [facility];  Service: Cardiovascular;  Laterality: N/A;   RIGHT/LEFT HEART CATH AND CORONARY ANGIOGRAPHY N/A 01/09/2022   Procedure: RIGHT/LEFT HEART CATH AND CORONARY ANGIOGRAPHY;  Surgeon: Early Osmond, MD;  Location: [facility];  Service: Cardiovascular;  Laterality: N/A;   Social History:  reports that she has never smoked. She has never used smokeless tobacco. She reports current alcohol use. She reports  that she does not use drugs.  No Known Allergies  Family History  Problem Relation Age of Onset   Heart attack Mother        died @ 36.   Diabetes type II Father        died @ 67.   Breast cancer Sister        died @ 28.    Prior to Admission medications   Medication Sig Start Date End Date Taking? Authorizing Provider  acetaminophen (TYLENOL) 500 MG tablet Take 1,000 mg by mouth every 6 (six) hours as needed for moderate pain.   Yes [provider]  apixaban (ELIQUIS) 5 MG TABS tablet Take 1 tablet (5 mg total) by mouth 2 (two) times daily. 01/12/22 02/11/22 Yes Donne Hazel, MD  lactose free nutrition (BOOST) LIQD Take 237 mLs by mouth at bedtime.   Yes [provider]  levothyroxine (SYNTHROID, LEVOTHROID) 88 MCG tablet Take 88 mcg by mouth daily before breakfast.   Yes [provider]  metoprolol tartrate (LOPRESSOR) 25 MG tablet Take 25 mg by mouth 2 (two) times daily.   Yes [provider]  Multiple Vitamin (MULTIVITAMIN WITH MINERALS) TABS tablet Take 1 tablet by mouth daily.   Yes [provider]  MYRBETRIQ 50 MG TB24 tablet Take 50 mg by mouth daily. 10/03/21  Yes [provider]  pantoprazole (PROTONIX) 40 MG tablet Take 40 mg by mouth 2 (two) times daily. 09/18/21  Yes [provider]  furosemide (LASIX) 20 MG tablet Take one tab daily as needed  for fluid or edema Patient not taking: Reported on 02/03/2022 01/12/22   Donne Hazel, MD  metoprolol succinate (TOPROL XL) 25 MG 24 hr tablet Take 1 tablet (25 mg total) by mouth daily. Patient not taking: Reported on 02/03/2022 01/12/22 01/12/23  Donne Hazel, MD    Physical Exam: Vitals:   02/03/22 0645 02/03/22 0700 02/03/22 0715 02/03/22 0724  BP: 126/67 125/77 131/82 131/82  Pulse: 65 65 72 67  Resp: 20 16 (!) 21 (!) 21  Temp:      TempSrc:      SpO2: 98% 100% 99% 98%  Weight:      Height:       General exam: Appears calm and comfortable and in no acute  distress. Conversant Respiratory: Rales on auscultation. Respiratory effort normal with no intercostal retractions or use of accessory muscles Cardiovascular: S1 & S2 heard, RRR. No murmurs, rubs, gallops or clicks. No LE edema Gastrointestinal: Abdomen is non-distended, soft and non-tender. No masses felt. Normal bowel sounds heard Neurologic: No focal neurological deficits Musculoskeletal: No calf tenderness Skin: No cyanosis. No new rashes Psychiatry: Alert and oriented. Memory intact. Mood & affect appropriate   Data Reviewed: There are no new results to review at this time.  Assessment and Plan: * Acute on chronic combined systolic and diastolic CHF (congestive heart failure) (HCC) Recent Transthoracic Echocardiogram from 8/31 significant for an LVEF of 40-45% with grade 1 diastolic dysfunction. Acute episode likely precipitated by dietary indiscretion. Patient with associated acute pulmonary edema which has responded well to initial diureses efforts. -Continue Lasix 40 mg IV BID -Daily weight and strict in/out -PT/OT -Telemetry  Acute pulmonary edema (HCC) See problems, Acute on chronic combined systolic and diastolic CHF (congestive heart failure) (H[facility]) and Acute respiratory failure with hypoxia ([place]).  Acute respiratory failure with hypoxia (HCC) Secondary to acute heart failure. Patient required BiPAP for management with subsequent improvement of symptoms after diuresis. Weaning quickly off of oxygen. -Wean oxygen to room air  Paroxysmal atrial fibrillation (HCC) Appears to have previously been diagnosed from last admission. Patient was managed on diltiazem and amiodarone on previous admission and transitioned to Lopressor. Patient also started on Eliquis for stroke prevention at that time. -Continue Lopressor and Eliquis  Chest pain Symptoms present during respiratory distress. Troponin elevated and likely secondary to demand ischemia from above. Recent cardiac  catheterization without evidence of obstructive CAD. Symptoms have resolved. -Trend troponin until peak  Hypothyroidism Patient is on Synthroid 88 mcg daily as an outpatient. -Continue Synthroid 88 mcg daily  GERD (gastroesophageal reflux disease) Patient is on Protonix 40 mg BID -Continue Protonix 40 mg daily  Essential hypertension Patient is on Lopressor 25 mg BID as an outpatient. -Continue Lopressor 25 mg BID    Advance Care Planning:   Code Status: DNR. Confirmed with patient at bedside.  Consults: None  Family Communication: None at bedside   Author: Cordelia Poche, MD 02/03/2022 7:51 AM  For on call review [email or web address].

## 2022-02-03 NOTE — ED Provider Notes (Signed)
[facility]ER[facility]ENCY DEPARTMENT Provider Note   CSN: [id number] Arrival date & time: 02/03/22  0320     History  Chief Complaint  Patient presents with   Shortness of Breath    Ellen Chandler is a 88 y.o. female.  The history is provided by the EMS personnel. The history is limited by the condition of the patient.  Shortness of Breath Severity:  Severe Onset quality:  Gradual Duration:  1 day Timing:  Constant Progression:  Worsening Chronicity:  New Context: not URI and not weather changes   Relieved by:  Nothing Worsened by:  Nothing Ineffective treatments:  None tried Associated symptoms: no cough, no fever and no rash   Risk factors: no prolonged immobilization   Patient with HTN and recently diagnosed with CHF but not yet on Lasix presents with 1 day of SOB.  Arrived on CPAP by ems and changed immediately to BIPAP in the ED.    Past Medical History:  Diagnosis Date   Abnormal ECG    a. Pt aware of long hx of abnl ecg->h/o normal stress testing in [place] ~ 2000.   Diastolic dysfunction    a. 04/2013 Echo: EF 60-65%, No rwma, Gr 1 DD, mild MR.   GERD (gastroesophageal reflux disease)    Hypertension    Hypothyroidism         Home Medications Prior to Admission medications   Medication Sig Start Date End Date Taking? Authorizing Provider  acetaminophen (TYLENOL) 500 MG tablet Take 1,000 mg by mouth every 6 (six) hours as needed for moderate pain.    [provider]  apixaban (ELIQUIS) 5 MG TABS tablet Take 1 tablet (5 mg total) by mouth 2 (two) times daily. 01/12/22 02/11/22  Donne Hazel, MD  furosemide (LASIX) 20 MG tablet Take one tab daily as needed for fluid or edema 01/12/22   Donne Hazel, MD  lactose free nutrition (BOOST) LIQD Take 237 mLs by mouth at bedtime.    [provider]  levothyroxine (SYNTHROID, LEVOTHROID) 88 MCG tablet Take 88 mcg by mouth daily before breakfast.    [provider]   metoprolol succinate (TOPROL XL) 25 MG 24 hr tablet Take 1 tablet (25 mg total) by mouth daily. 01/12/22 01/12/23  Donne Hazel, MD  Multiple Vitamin (MULTIVITAMIN WITH MINERALS) TABS tablet Take 1 tablet by mouth daily.    [provider]  MYRBETRIQ 50 MG TB24 tablet Take 50 mg by mouth daily. 10/03/21   [provider]  pantoprazole (PROTONIX) 40 MG tablet Take 40 mg by mouth 2 (two) times daily. 09/18/21   [provider]      Allergies    Patient has no known allergies.    Review of Systems   Review of Systems  Unable to perform ROS: Acuity of condition  Constitutional:  Negative for fever.  HENT:  Negative for facial swelling.   Respiratory:  Positive for shortness of breath. Negative for cough.   Skin:  Negative for rash.    Physical Exam Updated Vital Signs BP 117/73   Pulse 63   Temp 97.7 F (36.5 C) (Axillary)   Resp 15   Ht '5\' 4"'$  (1.626 m)   Wt 63.6 kg   SpO2 99%   BMI 24.07 kg/m  Physical Exam Vitals and nursing note reviewed. Exam conducted with a chaperone present.  Constitutional:      General: She is in acute distress.     Appearance: She  is well-developed.  HENT:     Head: Normocephalic and atraumatic.     Nose: Nose normal.  Eyes:     Pupils: Pupils are equal, round, and reactive to light.  Cardiovascular:     Rate and Rhythm: Regular rhythm. Tachycardia present.     Pulses: Normal pulses.     Heart sounds: Normal heart sounds.  Pulmonary:     Effort: Pulmonary effort is normal. No respiratory distress.     Breath sounds: Wheezing and rales present.  Abdominal:     General: Bowel sounds are normal. There is no distension.     Palpations: Abdomen is soft.     Tenderness: There is no abdominal tenderness. There is no guarding or rebound.  Genitourinary:    Vagina: No vaginal discharge.  Musculoskeletal:        General: Normal range of motion.     Cervical back: Neck supple.  Skin:    General: Skin is warm and dry.      Capillary Refill: Capillary refill takes less than 2 seconds.     Findings: No erythema or rash.  Neurological:     General: No focal deficit present.     Deep Tendon Reflexes: Reflexes normal.  Psychiatric:        Mood and Affect: Mood normal.     ED Results / Procedures / Treatments   Labs (all labs ordered are listed, but only abnormal results are displayed) Results for orders placed or performed during the hospital encounter of 02/03/22  SARS Coronavirus 2 by RT PCR (hospital order, performed in [facility] hospital lab) *cepheid single result test* Anterior Nasal Swab   Specimen: Anterior Nasal Swab  Result Value Ref Range   SARS Coronavirus 2 by RT PCR NEGATIVE NEGATIVE  CBC with Differential  Result Value Ref Range   WBC 9.6 4.0 - 10.5 K/uL   RBC 3.91 3.87 - 5.11 MIL/uL   Hemoglobin 13.2 12.0 - 15.0 g/dL   HCT 38.9 36.0 - 46.0 %   MCV 99.5 80.0 - 100.0 fL   MCH 33.8 26.0 - 34.0 pg   MCHC 33.9 30.0 - 36.0 g/dL   RDW 12.0 11.5 - 15.5 %   Platelets 308 150 - 400 K/uL   nRBC 0.0 0.0 - 0.2 %   Neutrophils Relative % 47 %   Neutro Abs 4.6 1.7 - 7.7 K/uL   Lymphocytes Relative 38 %   Lymphs Abs 3.6 0.7 - 4.0 K/uL   Monocytes Relative 10 %   Monocytes Absolute 1.0 0.1 - 1.0 K/uL   Eosinophils Relative 4 %   Eosinophils Absolute 0.4 0.0 - 0.5 K/uL   Basophils Relative 1 %   Basophils Absolute 0.1 0.0 - 0.1 K/uL   Immature Granulocytes 0 %   Abs Immature Granulocytes 0.03 0.00 - 0.07 K/uL  Comprehensive metabolic panel  Result Value Ref Range   Sodium 136 135 - 145 mmol/L   Potassium 4.7 3.5 - 5.1 mmol/L   Chloride 103 98 - 111 mmol/L   CO2 24 22 - 32 mmol/L   Glucose, Bld 146 (H) 70 - 99 mg/dL   BUN 19 8 - 23 mg/dL   Creatinine, Ser 0.90 0.44 - 1.00 mg/dL   Calcium 9.1 8.9 - 10.3 mg/dL   Total Protein 6.4 (L) 6.5 - 8.1 g/dL   Albumin 3.5 3.5 - 5.0 g/dL   AST 30 15 - 41 U/L   ALT 14 0 - 44 U/L   Alkaline Phosphatase 28 (  L) 38 - 126 U/L   Total Bilirubin 0.9 0.3 -  1.2 mg/dL   GFR, Estimated >60 >60 mL/min   Anion gap 9 5 - 15  Brain natriuretic peptide  Result Value Ref Range   B Natriuretic Peptide 884.1 (H) 0.0 - 100.0 pg/mL  Troponin I (High Sensitivity)  Result Value Ref Range   Troponin I (High Sensitivity) 13 <18 ng/L   DG Chest Portable 1 View  Result Date: 02/03/2022 CLINICAL DATA:  Shortness of breath EXAM: PORTABLE CHEST 1 VIEW COMPARISON:  01/10/2022 FINDINGS: Cardiac shadow is prominent but stable. Large hiatal hernia is again seen. Aortic calcifications are noted. Mild central vascular congestion with interstitial edema is noted. No focal confluent infiltrate is seen. IMPRESSION: Mild CHF with interstitial edema. Large hiatal hernia. Electronically Signed   By: Inez Catalina M.D.   On: 02/03/2022 03:35   DG CHEST PORT 1 VIEW  Result Date: 01/10/2022 CLINICAL DATA:  Short of breath and chest pain today. EXAM: PORTABLE CHEST 1 VIEW COMPARISON:  01/04/2022 and older studies. FINDINGS: Large hiatal hernia. Cardiac silhouette is normal in size. No mediastinal or hilar masses. Mild reticular type opacities in the lung bases consistent with atelectasis. Lungs otherwise clear. Possible small effusions. No pneumothorax. Skeletal structures are grossly intact. IMPRESSION: 1. No acute cardiopulmonary disease. 2. Large hiatal hernia. Electronically Signed   By: Lajean Manes M.D.   On: 01/10/2022 13:00   CARDIAC CATHETERIZATION  Result Date: 01/09/2022 1.  Normal right dominant coronary circulation with no obstructive coronary artery disease. 2.  Mean RA pressure of 1 mmHg, mean wedge pressure of 3 mmHg, cardiac output of 8.5 L/min and cardiac index of 5.1 L/min/m. 3.  LVEDP of 16 mmHg. 4.  Very tortuous right upper extremity that could not be navigated necessitating a femoral approach. Recommendation: Goal-directed medical therapy.   CARDIAC CATHETERIZATION  Result Date: 01/05/2022 Cardiac cath cancelled due to lethargy and change in level of  consciousness.   ECHOCARDIOGRAM COMPLETE  Result Date: 01/04/2022    ECHOCARDIOGRAM REPORT   Patient Name:   ADRIAN SPECHT Date of Exam: 01/04/2022 Medical Rec #:  [id number]         Height:       64.0 in Accession #:    [id number]        Weight:       134.5 lb Date of Birth:  06-Jun-1930         BSA:          1.653 m Patient Age:    27 years          BP:           129/82 mmHg Patient Gender: F                 HR:           108 bpm. Exam Location:  Inpatient Procedure: 2D Echo, Cardiac Doppler and Color Doppler Indications:    CHF-Acute Diastolic H82.99  History:        Patient has prior history of Echocardiogram examinations, most                 recent 05/05/2013. CHF, Angina; Risk Factors:Hypertension.  Sonographer:    Ronny Flurry Referring Phys: [place]  1. Left ventricular ejection fraction, by estimation, is 40 to 45%. The left ventricle has mildly decreased function. The left ventricle has no regional wall motion abnormalities. Left ventricular diastolic parameters are consistent with  Grade I diastolic dysfunction (impaired relaxation). Elevated left ventricular end-diastolic pressure.  2. Right ventricular systolic function is normal. The right ventricular size is normal. There is normal pulmonary artery systolic pressure.  3. Left atrial size was moderately dilated.  4. The mitral valve is normal in structure. Trivial mitral valve regurgitation. No evidence of mitral stenosis.  5. The aortic valve is tricuspid. There is mild calcification of the aortic valve. There is mild thickening of the aortic valve. Aortic valve regurgitation is trivial. No aortic stenosis is present.  6. The inferior vena cava is normal in size with greater than 50% respiratory variability, suggesting right atrial pressure of 3 mmHg. FINDINGS  Left Ventricle: Left ventricular ejection fraction, by estimation, is 40 to 45%. The left ventricle has mildly decreased function. The left ventricle has no  regional wall motion abnormalities. The left ventricular internal cavity size was normal in size. There is no left ventricular hypertrophy. Left ventricular diastolic parameters are consistent with Grade I diastolic dysfunction (impaired relaxation). Elevated left ventricular end-diastolic pressure.  LV Wall Scoring: The entire septum, apical anterior segment, apical inferior segment, and apex are hypokinetic. The anterior wall, entire lateral wall, and inferior wall are normal. Right Ventricle: The right ventricular size is normal. No increase in right ventricular wall thickness. Right ventricular systolic function is normal. There is normal pulmonary artery systolic pressure. The tricuspid regurgitant velocity is 1.57 m/s, and  with an assumed right atrial pressure of 3 mmHg, the estimated right ventricular systolic pressure is 15.4 mmHg. Left Atrium: Left atrial size was moderately dilated. Right Atrium: Right atrial size was normal in size. Pericardium: There is no evidence of pericardial effusion. Mitral Valve: The mitral valve is normal in structure. Trivial mitral valve regurgitation. No evidence of mitral valve stenosis. Tricuspid Valve: The tricuspid valve is normal in structure. Tricuspid valve regurgitation is trivial. No evidence of tricuspid stenosis. Aortic Valve: The aortic valve is tricuspid. There is mild calcification of the aortic valve. There is mild thickening of the aortic valve. Aortic valve regurgitation is trivial. No aortic stenosis is present. Pulmonic Valve: The pulmonic valve was normal in structure. Pulmonic valve regurgitation is not visualized. No evidence of pulmonic stenosis. Aorta: The aortic root is normal in size and structure. Venous: The inferior vena cava is normal in size with greater than 50% respiratory variability, suggesting right atrial pressure of 3 mmHg. IAS/Shunts: No atrial level shunt detected by color flow Doppler.  LEFT VENTRICLE PLAX 2D LVIDd:         4.80 cm    Diastology LVIDs:         3.80 cm   LV e' medial:   7.46 cm/s LV PW:         0.90 cm   LV E/e' medial: 17.0 LV IVS:        0.70 cm LVOT diam:     1.80 cm LV SV:         62 LV SV Index:   38 LVOT Area:     2.54 cm  RIGHT VENTRICLE RV S prime:     11.90 cm/s TAPSE (M-mode): 2.4 cm LEFT ATRIUM             Index        RIGHT ATRIUM           Index LA diam:        4.00 cm 2.42 cm/m   RA Area:     14.20 cm LA Vol (A2C):  61.2 ml 37.03 ml/m  RA Volume:   33.20 ml  20.09 ml/m LA Vol (A4C):   55.3 ml 33.46 ml/m LA Biplane Vol: 59.9 ml 36.24 ml/m  AORTIC VALVE LVOT Vmax:   107.00 cm/s LVOT Vmean:  73.600 cm/s LVOT VTI:    0.245 m  AORTA Ao Root diam: 3.30 cm Ao Asc diam:  3.40 cm MITRAL VALVE                TRICUSPID VALVE MV Area (PHT): 4.24 cm     TR Peak grad:   9.9 mmHg MV Decel Time: 179 msec     TR Vmax:        157.00 cm/s MV E velocity: 127.00 cm/s MV A velocity: 132.00 cm/s  SHUNTS MV E/A ratio:  0.96         Systemic VTI:  0.24 m                             Systemic Diam: 1.80 cm Skeet Latch MD Electronically signed by Skeet Latch MD Signature Date/Time: 01/04/2022/5:53:23 PM    Final    CT Angio Chest Pulmonary Embolism (PE) W or WO Contrast  Result Date: 01/04/2022 CLINICAL DATA:  Rule out pulmonary embolus. Chest pain. Shortness of breath. EXAM: CT ANGIOGRAPHY CHEST WITH CONTRAST TECHNIQUE: Multidetector CT imaging of the chest was performed using the standard protocol during bolus administration of intravenous contrast. Multiplanar CT image reconstructions and MIPs were obtained to evaluate the vascular anatomy. RADIATION DOSE REDUCTION: This exam was performed according to the departmental dose-optimization program which includes automated exposure control, adjustment of the mA and/or kV according to patient size and/or use of iterative reconstruction technique. CONTRAST:  74m OMNIPAQUE IOHEXOL 350 MG/ML SOLN COMPARISON:  None Available. FINDINGS: Cardiovascular: Satisfactory opacification  of the pulmonary arteries to the segmental level. No evidence of pulmonary embolism. Normal heart size. Aortic atherosclerotic calcifications and coronary artery calcifications. No pericardial effusion. Mediastinum/Nodes: Thyroid gland, trachea are unremarkable. There is a large hiatal hernia identified which contains at least 50% intrathoracic stomach. Circumferential wall thickening of the distal esophagus just above the hiatal hernia is noted which may reflect esophagitis. No enlarged axillary, supraclavicular, mediastinal or hilar lymph nodes. Lungs/Pleura: Small bilateral pleural effusions are identified. There is diffuse interlobular septal thickening and ground-glass attenuation bilaterally concerning for interstitial edema. Atelectasis is noted within the posterior lower lobes overlying both pleural effusions. There is also atelectasis versus scarring noted within the inferior lingula. A few scattered patchy airspace densities are noted within the posterolateral right upper lobe and superior segment of right lower lobe. Within the medial apical segment of the right upper lobe there is a subpleural nodular density measuring 1.8 by 1.0 by 0.8 cm, image 23/5 and image 76/8. This appears similar to cervical spine CT from 02/14/2021 and is favored to represent an area of pleuroparenchymal scarring. Upper Abdomen: Large stone is identified within the gallbladder measuring 2.5 cm. This is only partially visualized. No acute abnormality. Musculoskeletal: Diffuse osteopenia. Midthoracic spine compression deformity is stable compared with chest radiograph from 06/17/2021. Review of the MIP images confirms the above findings. IMPRESSION: 1. No evidence for acute pulmonary embolism. 2. Diffuse interlobular septal thickening and ground-glass attenuation bilaterally concerning for interstitial edema. 3. Small bilateral pleural effusions with overlying atelectasis. 4. A few scattered patchy airspace densities are noted  within the right lung. Findings may reflect inflammation or infection versus asymmetric areas of alveolar edema.  5. Large hiatal hernia. Circumferential wall thickening of the distal esophagus just above the hiatal hernia may reflect esophagitis. 6. Cholelithiasis. 7. Stable midthoracic spine compression deformity. 8. Coronary artery calcifications. 9. Aortic Atherosclerosis (ICD10-I70.0). Electronically Signed   By: Kerby Moors M.D.   On: 01/04/2022 10:24    EKG EKG Interpretation  Date/Time:  Saturday February 03 2022 03:23:43 EDT Ventricular Rate:  86 PR Interval:  188 QRS Duration: 96 QT Interval:  387 QTC Calculation: 463 R Axis:   86 Text Interpretation: Sinus rhythm Multiform ventricular premature complexes Confirmed by Dory Horn) on 02/03/2022 3:29:04 AM  Radiology DG Chest Portable 1 View  Result Date: 02/03/2022 CLINICAL DATA:  Shortness of breath EXAM: PORTABLE CHEST 1 VIEW COMPARISON:  01/10/2022 FINDINGS: Cardiac shadow is prominent but stable. Large hiatal hernia is again seen. Aortic calcifications are noted. Mild central vascular congestion with interstitial edema is noted. No focal confluent infiltrate is seen. IMPRESSION: Mild CHF with interstitial edema. Large hiatal hernia. Electronically Signed   By: Inez Catalina M.D.   On: 02/03/2022 03:35    Procedures Procedures    Medications Ordered in ED Medications  furosemide (LASIX) injection 40 mg (has no administration in time range)     ED Course/ Medical Decision Making/ A&P                           Medical Decision Making Patient at nursing home with one day of SOB.    Problems Addressed: Acute pulmonary edema ([place]): acute illness or injury    Details: BIPAP by me and IV lasix and admission   Amount and/or Complexity of Data Reviewed Independent Historian: EMS    Details: See above  External Data Reviewed: notes.    Details: Previous notes reviewed  Labs: ordered.    Details: All labs  reviewed: negative covid, elevated BNP 884.1, normal white count 9.6, normal hemoglobin 13.2 normal platelet count.  Normal sodium 136, normal potassium 4.7 and normal creatinine .9.  Normal LFTs.  Troponin negative 13  Radiology: ordered and independent interpretation performed.    Details: CHF by me on CXR ECG/medicine tests: ordered and independent interpretation performed. Decision-making details documented in ED Course.  Risk Prescription drug management. Decision regarding hospitalization.  Critical Care Total time providing critical care: 60 minutes (BIPAP initiated by me, complexity of diagnosis and treatment o)    Final Clinical Impression(s) / ED Diagnoses Final diagnoses:  Acute pulmonary edema ([place])   The patient appears reasonably stabilized for admission considering the current resources, flow, and capabilities available in the ED at this time, and I doubt any other [facility] requiring further screening and/or treatment in the ED prior to admission.  Rx / DC Orders ED Discharge Orders     None         Abbrielle Batts, MD 02/03/22 0388

## 2022-02-03 NOTE — Assessment & Plan Note (Signed)
Patient is on Protonix 40 mg BID -Continue Protonix 40 mg daily

## 2022-02-03 NOTE — ED Notes (Signed)
Admit. MD art bedside.

## 2022-02-03 NOTE — Assessment & Plan Note (Addendum)
Appears to have previously been diagnosed from last admission. Patient was managed on diltiazem and amiodarone on previous admission and transitioned to Lopressor. Patient also started on Eliquis for stroke prevention at that time. Patient developed recurrent RVR during hospitalization with associated evidence of atrial flutter. Patient started on Cardizem drip with subsequent improvement of rates and hypotension; Cardizem discontinued. -Consult cardiology -Check TSH

## 2022-02-04 DIAGNOSIS — R072 Precordial pain: Secondary | ICD-10-CM | POA: Diagnosis not present

## 2022-02-04 DIAGNOSIS — Z833 Family history of diabetes mellitus: Secondary | ICD-10-CM | POA: Diagnosis not present

## 2022-02-04 DIAGNOSIS — I4892 Unspecified atrial flutter: Secondary | ICD-10-CM | POA: Diagnosis present

## 2022-02-04 DIAGNOSIS — Z79899 Other long term (current) drug therapy: Secondary | ICD-10-CM | POA: Diagnosis not present

## 2022-02-04 DIAGNOSIS — I2510 Atherosclerotic heart disease of native coronary artery without angina pectoris: Secondary | ICD-10-CM | POA: Diagnosis present

## 2022-02-04 DIAGNOSIS — E039 Hypothyroidism, unspecified: Secondary | ICD-10-CM | POA: Diagnosis present

## 2022-02-04 DIAGNOSIS — E871 Hypo-osmolality and hyponatremia: Secondary | ICD-10-CM | POA: Diagnosis not present

## 2022-02-04 DIAGNOSIS — I110 Hypertensive heart disease with heart failure: Secondary | ICD-10-CM | POA: Diagnosis present

## 2022-02-04 DIAGNOSIS — I480 Paroxysmal atrial fibrillation: Secondary | ICD-10-CM | POA: Diagnosis not present

## 2022-02-04 DIAGNOSIS — Z79890 Hormone replacement therapy (postmenopausal): Secondary | ICD-10-CM | POA: Diagnosis not present

## 2022-02-04 DIAGNOSIS — J810 Acute pulmonary edema: Secondary | ICD-10-CM | POA: Diagnosis not present

## 2022-02-04 DIAGNOSIS — E876 Hypokalemia: Secondary | ICD-10-CM

## 2022-02-04 DIAGNOSIS — Z96652 Presence of left artificial knee joint: Secondary | ICD-10-CM | POA: Diagnosis present

## 2022-02-04 DIAGNOSIS — J9601 Acute respiratory failure with hypoxia: Secondary | ICD-10-CM | POA: Diagnosis present

## 2022-02-04 DIAGNOSIS — N179 Acute kidney failure, unspecified: Secondary | ICD-10-CM | POA: Diagnosis not present

## 2022-02-04 DIAGNOSIS — K219 Gastro-esophageal reflux disease without esophagitis: Secondary | ICD-10-CM | POA: Diagnosis present

## 2022-02-04 DIAGNOSIS — I10 Essential (primary) hypertension: Secondary | ICD-10-CM | POA: Diagnosis not present

## 2022-02-04 DIAGNOSIS — Z66 Do not resuscitate: Secondary | ICD-10-CM | POA: Diagnosis not present

## 2022-02-04 DIAGNOSIS — Z803 Family history of malignant neoplasm of breast: Secondary | ICD-10-CM | POA: Diagnosis not present

## 2022-02-04 DIAGNOSIS — T502X5A Adverse effect of carbonic-anhydrase inhibitors, benzothiadiazides and other diuretics, initial encounter: Secondary | ICD-10-CM | POA: Diagnosis not present

## 2022-02-04 DIAGNOSIS — I952 Hypotension due to drugs: Secondary | ICD-10-CM | POA: Diagnosis not present

## 2022-02-04 DIAGNOSIS — R0602 Shortness of breath: Secondary | ICD-10-CM | POA: Diagnosis present

## 2022-02-04 DIAGNOSIS — Z8249 Family history of ischemic heart disease and other diseases of the circulatory system: Secondary | ICD-10-CM | POA: Diagnosis not present

## 2022-02-04 DIAGNOSIS — I5043 Acute on chronic combined systolic (congestive) and diastolic (congestive) heart failure: Secondary | ICD-10-CM | POA: Diagnosis present

## 2022-02-04 DIAGNOSIS — Z7901 Long term (current) use of anticoagulants: Secondary | ICD-10-CM | POA: Diagnosis not present

## 2022-02-04 DIAGNOSIS — Z1152 Encounter for screening for COVID-19: Secondary | ICD-10-CM | POA: Diagnosis not present

## 2022-02-04 LAB — BASIC METABOLIC PANEL
Anion gap: 13 (ref 5–15)
BUN: 20 mg/dL (ref 8–23)
CO2: 30 mmol/L (ref 22–32)
Calcium: 9.8 mg/dL (ref 8.9–10.3)
Chloride: 95 mmol/L — ABNORMAL LOW (ref 98–111)
Creatinine, Ser: 0.88 mg/dL (ref 0.44–1.00)
GFR, Estimated: >60 mL/min (ref >60)
Glucose, Bld: 100 mg/dL — ABNORMAL HIGH (ref 70–99)
Potassium: 3.4 mmol/L — ABNORMAL LOW (ref 3.5–5.1)
Sodium: 138 mmol/L (ref 135–145)

## 2022-02-04 LAB — TSH: TSH: 4.788 u[IU]/mL — ABNORMAL HIGH (ref 0.350–4.500)

## 2022-02-04 MED ORDER — AMIODARONE HCL IN DEXTROSE 360-4.14 MG/200ML-% IV SOLN
60.0000 mg/h | INTRAVENOUS | Status: AC
  Administered 2022-02-04 (×2): 60 mg/h via INTRAVENOUS
  Filled 2022-02-04 (×2): qty 200

## 2022-02-04 MED ORDER — POTASSIUM CHLORIDE CRYS ER 20 MEQ PO TBCR
40.0000 meq | EXTENDED_RELEASE_TABLET | Freq: Two times a day (BID) | ORAL | Status: AC
  Administered 2022-02-04 (×2): 40 meq via ORAL
  Filled 2022-02-04 (×2): qty 2

## 2022-02-04 MED ORDER — DILTIAZEM HCL-DEXTROSE 125-5 MG/125ML-% IV SOLN (PREMIX)
5.0000 mg/h | INTRAVENOUS | Status: DC
  Administered 2022-02-04: 5 mg/h via INTRAVENOUS
  Filled 2022-02-04: qty 125

## 2022-02-04 MED ORDER — AMIODARONE LOAD VIA INFUSION
150.0000 mg | Freq: Once | INTRAVENOUS | Status: AC
  Administered 2022-02-04: 150 mg via INTRAVENOUS
  Filled 2022-02-04: qty 83.34

## 2022-02-04 MED ORDER — AMIODARONE HCL IN DEXTROSE 360-4.14 MG/200ML-% IV SOLN
30.0000 mg/h | INTRAVENOUS | Status: DC
  Administered 2022-02-04 – 2022-02-05 (×2): 30 mg/h via INTRAVENOUS
  Filled 2022-02-04: qty 200

## 2022-02-04 MED ORDER — FUROSEMIDE 10 MG/ML IJ SOLN
40.0000 mg | Freq: Two times a day (BID) | INTRAMUSCULAR | Status: AC
  Administered 2022-02-04: 40 mg via INTRAVENOUS
  Filled 2022-02-04: qty 4

## 2022-02-04 NOTE — Evaluation (Signed)
Physical Therapy Evaluation Patient Details Name: Ellen Chandler MRN: [id number] DOB: [birth year] Today's Date: 02/04/2022  History of Present Illness  Pt is an 88 yo woman admitted on 02/03/22 with SOB and substernal chest pain. + CHF exacerbation and acute pulmonary edema. Pt with afib overnight on 02/02/22, started Cardizem drip. PMH: CHF, HTN, hypothyroidism, GERD.  Clinical Impression  Pt admitted secondary to problem above with deficits below. Requiring min A for bed mobility tasks. Limited session today as MD requesting no OOB until after cardiology consult.  Anticipate pt will progress well and recommending HHPT at d/c. Will continue to follow acutely.      Recommendations for follow up therapy are one component of a multi-disciplinary discharge planning process, led by the attending physician.  Recommendations may be updated based on patient status, additional functional criteria and insurance authorization.  Follow Up Recommendations Home health PT (pending progression)      Assistance Recommended at Discharge Intermittent Supervision/Assistance  Patient can return home with the following  Assistance with cooking/housework;Help with stairs or ramp for entrance;Assist for transportation    Equipment Recommendations    Recommendations for Other Services       Functional Status Assessment Patient has had a recent decline in their functional status and demonstrates the ability to make significant improvements in function in a reasonable and predictable amount of time.     Precautions / Restrictions Precautions Precautions: Fall Precaution Comments: watch BP, HR Restrictions Weight Bearing Restrictions: No      Mobility  Bed Mobility Overal bed mobility: Needs Assistance Bed Mobility: Supine to Sit, Sit to Supine     Supine to sit: Min assist Sit to supine: Supervision   General bed mobility comments: HOB up slightly, assist to guide LEs to EOB and to raise trunk     Transfers                   General transfer comment: NT    Ambulation/Gait                  Stairs            Wheelchair Mobility    Modified Rankin (Stroke Patients Only)       Balance Overall balance assessment: Needs assistance   Sitting balance-Leahy Scale: Good Sitting balance - Comments: no LOB with attempt to don slippers                                     Pertinent Vitals/Pain Pain Assessment Pain Assessment: No/denies pain    Home Living Family/patient expects to be discharged to:: Private residence Living Arrangements: Alone Available Help at Discharge: Family;Available PRN/intermittently Type of Home: Independent living facility Home Access: Level entry       Home Layout: One level Home Equipment: Conservation officer, nature (2 wheels);Rollator (4 wheels);BSC/3in1;Grab bars - tub/shower;Grab bars - toilet      Prior Function Prior Level of Function : Independent/Modified Independent             Mobility Comments: Uses rollator for ambulation ADLs Comments: assist for cleaning 1x/month, family gets groceries and takes her to appointments, they check on her daily     Hand Dominance   Dominant Hand: Right    Extremity/Trunk Assessment   Upper Extremity Assessment Upper Extremity Assessment: Defer to OT evaluation    Lower Extremity Assessment Lower Extremity Assessment: Generalized weakness  Cervical / Trunk Assessment Cervical / Trunk Assessment: Kyphotic  Communication   Communication: No difficulties  Cognition Arousal/Alertness: Awake/alert Behavior During Therapy: WFL for tasks assessed/performed Overall Cognitive Status: Within Functional Limits for tasks assessed                                          General Comments      Exercises     Assessment/Plan    PT Assessment Patient needs continued PT services  PT Problem List Decreased strength;Decreased range of  motion;Decreased activity tolerance;Decreased balance;Decreased mobility;Cardiopulmonary status limiting activity       PT Treatment Interventions Gait training;DME instruction;Functional mobility training;Therapeutic activities;Therapeutic exercise;Balance training    PT Goals (Current goals can be found in the Care Plan section)  Acute Rehab PT Goals Patient Stated Goal: to go home PT Goal Formulation: With patient Time For Goal Achievement: 02/18/22 Potential to Achieve Goals: Good    Frequency Min 3X/week     Co-evaluation               AM-PAC PT "6 Clicks" Mobility  Outcome Measure Help needed turning from your back to your side while in a flat bed without using bedrails?: A Little Help needed moving from lying on your back to sitting on the side of a flat bed without using bedrails?: A Little Help needed moving to and from a bed to a chair (including a wheelchair)?: A Little Help needed standing up from a chair using your arms (e.g., wheelchair or bedside chair)?: A Little Help needed to walk in hospital room?: A Little Help needed climbing 3-5 steps with a railing? : A Lot 6 Click Score: 17    End of Session   Activity Tolerance: Patient tolerated treatment well Patient left: with call bell/phone within reach;in bed (with MD present) Nurse Communication: Mobility status PT Visit Diagnosis: Unsteadiness on feet (R26.81);Muscle weakness (generalized) (M62.81)    Time: 6144-3154 PT Time Calculation (min) (ACUTE ONLY): 10 min   Charges:   PT Evaluation $PT Eval Low Complexity: 1 Low          Reuel Derby, PT, DPT  Acute Rehabilitation Services  Office: [phone]   Rudean Hitt 02/04/2022, 3:48 PM

## 2022-02-04 NOTE — Hospital Course (Signed)
Ellen Chandler was admitted to the hospital with the working diagnosis of acute heart failure decompensation.    88 y.o. female with a history of  heart failure, GERD, hypertension, hypothyroidism. Patient presented secondary to shortness of breath and found to have evidence of acute heart failure and atrial fibrillation with RVR.  Reported abrupt onset of dyspnea, for about 12 hrs, worse with movement, and associated with substernal chest pain. Her blood pressure was 126/67 HR 72, RR 21 and 02 saturation 98%, lungs with rales bilaterally, heart with S1 and S2 present and rhythmic, abdomen not distended and no lower extremity edema.   Na 130, K 4,7 CL 103 bicarbonate 24, glucose 146 bun 19 cr 0,90 High sensitive troponin 13. 173, 279, 238  Wbc 9,6 hgb 13.2 plt 308  Sars covid 19 negative  Chest radiograph with bilateral hilar vascular congestion and cephalization of the vasculature.   EKG 86 bpm, normal axis, normal intervals, sinus rhythm with positive PVCs, no significant ST segment or T wave changes.   Prior to admission, patient reverted to sinus rhythm. Patient managed on BiPAP and give Lasix IV for her heart failure with improvement of symptoms. Supplemental oxygen weaned off. During hospitalization, patient had recurrent RVR with associated atrial fibrillation and flutter. Cardiology consulted.  Patient was placed on amiodarone for rate control atrial fibrillation, because use of diltiazem resulted in hypotension.  Patient converted from atrial fibrillation back into sinus rhythm.   Hospitalization prolonged due to AKI due to over diuresis.

## 2022-02-04 NOTE — Evaluation (Signed)
Occupational Therapy Evaluation Patient Details Name: Ellen Chandler MRN: [id number] DOB: [birth year] Today's Date: 02/04/2022   History of Present Illness Pt is an 88 yo woman admitted on 02/03/22 with SOB and substernal chest pain. + CHF exacerbation and acute pulmonary edema. Pt with afib overnight on 02/02/22, started Cardizem drip. PMH: CHF, HTN, hypothyroidism, GERD.   Clinical Impression   Pt lives at [place], walks with a rollator and is assisted for heavy housekeeping monthly, getting her groceries and transportation to appointments. Pt is typically independent in self care. Limited session today as MD requesting no OOB until after cardiology consult. Pt presents with generalized weakness needing min assist for supine to sit. She needs set up to min assist for ADLs with difficulty noted donning slippers. Pt with Sp02 ot 95% on 1L, 93% on RA during bed mobility and sitting EOB. Recommending HHOT, will follow acutely.      Recommendations for follow up therapy are one component of a multi-disciplinary discharge planning process, led by the attending physician.  Recommendations may be updated based on patient status, additional functional criteria and insurance authorization.   Follow Up Recommendations  Home health OT    Assistance Recommended at Discharge Intermittent Supervision/Assistance  Patient can return home with the following Assistance with cooking/housework;Assist for transportation;Help with stairs or ramp for entrance    Functional Status Assessment  Patient has had a recent decline in their functional status and demonstrates the ability to make significant improvements in function in a reasonable and predictable amount of time.  Equipment Recommendations  None recommended by OT    Recommendations for Other Services       Precautions / Restrictions Precautions Precautions: Fall Precaution Comments: watch BP, HR      Mobility Bed Mobility Overal bed  mobility: Needs Assistance Bed Mobility: Supine to Sit, Sit to Supine     Supine to sit: Min assist Sit to supine: Supervision   General bed mobility comments: HOB up slightly, assist to guide LEs to EOB and to raise trunk    Transfers                   General transfer comment: NT      Balance Overall balance assessment: Needs assistance   Sitting balance-Leahy Scale: Good Sitting balance - Comments: no LOB with attempt to don slippers                                   ADL either performed or assessed with clinical judgement   ADL Overall ADL's : Needs assistance/impaired Eating/Feeding: Independent;Bed level   Grooming: Set up;Sitting   Upper Body Bathing: Set up;Sitting   Lower Body Bathing: Minimal assistance;Sitting/lateral leans   Upper Body Dressing : Set up;Sitting   Lower Body Dressing: Minimal assistance;Sitting/lateral leans                       Vision Baseline Vision/History: 1 Wears glasses Ability to See in Adequate Light: 0 Adequate Patient Visual Report: No change from baseline Additional Comments: reading glasses     Perception     Praxis      Pertinent Vitals/Pain Pain Assessment Pain Assessment: No/denies pain     Hand Dominance Right   Extremity/Trunk Assessment Upper Extremity Assessment Upper Extremity Assessment: Generalized weakness   Lower Extremity Assessment Lower Extremity Assessment: Defer to PT evaluation  Communication Communication Communication: No difficulties   Cognition Arousal/Alertness: Awake/alert Behavior During Therapy: WFL for tasks assessed/performed Overall Cognitive Status: Within Functional Limits for tasks assessed                                       General Comments       Exercises     Shoulder Instructions      Home Living Family/patient expects to be discharged to:: Private residence Living Arrangements: Alone Available Help at  Discharge: Family;Available PRN/intermittently Type of Home: Independent living facility Home Access: Level entry     Home Layout: One level     Bathroom Shower/Tub: Occupational psychologist: Standard     Home Equipment: Conservation officer, nature (2 wheels);Rollator (4 wheels);BSC/3in1;Grab bars - tub/shower;Grab bars - toilet          Prior Functioning/Environment Prior Level of Function : Independent/Modified Independent             Mobility Comments: Uses rollator for ambulation ADLs Comments: assist for cleaning 1x/month, family gets groceries and takes her to appointments, they check on her daily        OT Problem List: Decreased activity tolerance;Impaired balance (sitting and/or standing);Cardiopulmonary status limiting activity      OT Treatment/Interventions: Self-care/ADL training;DME and/or AE instruction;Therapeutic activities;Patient/family education;Balance training    OT Goals(Current goals can be found in the care plan section) Acute Rehab OT Goals OT Goal Formulation: With patient Time For Goal Achievement: 02/18/22 Potential to Achieve Goals: Good ADL Goals Pt Will Perform Grooming: with supervision;standing Pt Will Perform Lower Body Bathing: with supervision;sit to/from stand Pt Will Perform Lower Body Dressing: with supervision;sit to/from stand Pt Will Transfer to Toilet: with supervision;ambulating;regular height toilet Pt Will Perform Toileting - Clothing Manipulation and hygiene: with supervision;sit to/from stand Additional ADL Goal #1: Pt will perform bed mobility modified independently in preparation for ADLs.  OT Frequency: Min 2X/week    Co-evaluation              AM-PAC OT "6 Clicks" Daily Activity     Outcome Measure Help from another person eating meals?: None Help from another person taking care of personal grooming?: A Little Help from another person toileting, which includes using toliet, bedpan, or urinal?: A Little Help  from another person bathing (including washing, rinsing, drying)?: A Little Help from another person to put on and taking off regular upper body clothing?: None Help from another person to put on and taking off regular lower body clothing?: A Little 6 Click Score: 20   End of Session Nurse Communication: Other (comment) (aware MD wants bed rest until after cardiology consult)  Activity Tolerance: Treatment limited secondary to medical complications (Comment) (Dr Lonny Prude requesting to hold OOB until pt seen by cardiology.) Patient left: in bed;with call bell/phone within reach;with bed alarm set;Other (comment) (MD in room)  OT Visit Diagnosis: Unsteadiness on feet (R26.81);Other abnormalities of gait and mobility (R26.89);Muscle weakness (generalized) (M62.81)                Time: 5176-1607 OT Time Calculation (min): 10 min Charges:  OT General Charges $OT Visit: 1 Visit OT Evaluation $OT Eval Moderate Complexity: [place], OTR/L Acute Rehabilitation Services Office: [phone]   Malka So 02/04/2022, 12:25 PM

## 2022-02-04 NOTE — Progress Notes (Signed)
At 2342, patient called this nurse stating there is something wrong with her breathing, noticed her heart rate to be at 117 but non sustaining, goes back to 107 after a while, oxygen increased to 2 lpm for comfort.   2344- Paged Jennette Kettle regarding Heart rate sustaining at 120-130, lopressor given as ordered.  0005- heart rate not improving, EKG done and noted atrial flutter at 119 bpm, paged Jennette Kettle, Cardizem IV ordered, Charge nurse Fred informed MEWS escalated.   At 0027, cardizem started by Charge nurse Josph Macho, transfer of care done at bedside.     02/03/22 2350 02/04/22 0009 02/04/22 0027  Assess: MEWS Score  Temp 98.1 F (36.7 C)  --   --   BP 109/73  --  (!) 113/56  MAP (mmHg) 83  --  74  Pulse Rate 94  --  (!) 108  ECG Heart Rate (!) 113 (!) 121 (!) 108  Resp 19 18 (!) 26  Level of Consciousness Alert  --  Alert  SpO2 93 %  --  100 %  O2 Device Nasal Cannula  --  Nasal Cannula  O2 Flow Rate (L/min) 2 L/min  --  2 L/min  Assess: MEWS Score  MEWS Temp 0 0 0  MEWS Systolic 0 0 0  MEWS Pulse '2 2 1  '$ MEWS RR 0 0 2  MEWS LOC 0 0 0  MEWS Score '2 2 3  '$ MEWS Score Color Yellow Yellow Yellow  Assess: if the MEWS score is Yellow or Red  Were vital signs taken at a resting state? Yes Yes  --   Focused Assessment No change from prior assessment No change from prior assessment  --   Does the patient meet 2 or more of the SIRS criteria? No No  --   MEWS guidelines implemented *See Row Information* No, vital signs rechecked  --   --   Treat  MEWS Interventions Administered prn meds/treatments Escalated (See documentation below)  --   Pain Scale 0-10  --   --   Pain Score 0  --   --   Escalate  MEWS: Escalate  --  Yellow: discuss with charge nurse/RN and consider discussing with provider and RRT  --   Notify: Charge Nurse/RN  Name of Charge Nurse/RN Notified Fred RN  --   --   Date Charge Nurse/RN Notified 02/04/22  --   --   Time Charge Nurse/RN Notified 0006  --   --    Notify: Provider  Provider Name/Title Jennette Kettle  --   --   Date Provider Notified 02/03/22  --   --   Time Provider Notified 2344  --   --   Method of Notification Page  --   --   Notification Reason  (HR 130's)  (no change post lopressor administration)  --   Provider response See new orders See new orders  --   Date of Provider Response 02/03/22 02/04/22  --   Time of Provider Response 2345 0009  --   Assess: SIRS CRITERIA  SIRS Temperature  0 0 0  SIRS Pulse '1 1 1  '$ SIRS Respirations  0 0 1  SIRS WBC '1 1 1  '$ SIRS Score Sum  2 2 3

## 2022-02-04 NOTE — Assessment & Plan Note (Signed)
Occurred overnight. -Consult cardiology

## 2022-02-04 NOTE — TOC Initial Note (Addendum)
Transition of Care [facility]) - Initial/Assessment Note    Patient Details  Name: Ellen Chandler MRN: [id number] Date of Birth: 22-Dec-1930  [facility]) CM/SW Contact:    Milas Gain, [place] Phone Number: 02/04/2022, 2:13 PM  Clinical Narrative:                   CSW spoke with patient regarding her dc plan when medically ready for dc. Patient reports she comes from [place]. Patient confirmed plan is to return to [place] when medically ready for dc. [place] reports her daughter Jana Half will transport her back over to Independent living when medically ready for dc.    Expected Discharge Plan: Home/Self Care ([place]) Barriers to Discharge: Continued Medical Work up   Patient Goals and CMS Choice Patient states their goals for this hospitalization and ongoing recovery are:: to return to [place] Medicare.gov Compare Post Acute Care list provided to:: Patient Choice offered to / list presented to : Patient  Expected Discharge Plan and Services Expected Discharge Plan: Home/Self Care ([facility]) In-house Referral: Clinical Social Work     Living arrangements for the past 2 months: [place] ([place])                                      Prior Living Arrangements/Services Living arrangements for the past 2 months: [place] ([facility]) Lives with:: Self (From [facility]) Patient language and need for interpreter reviewed:: Yes Do you feel safe going back to the place where you live?: Yes      Need for Family Participation in Patient Care: Yes (Comment) Care giver support system in place?: Yes (comment)   Criminal Activity/Legal Involvement Pertinent to Current Situation/Hospitalization: No - Comment as needed  Activities of Daily Living Home Assistive Devices/Equipment: Walker  (specify type) (4 wheel) ADL Screening (condition at time of admission) Patient's cognitive ability adequate to safely complete daily activities?: Yes Is the patient deaf or have difficulty hearing?: No Does the patient have difficulty seeing, even when wearing glasses/contacts?: No Does the patient have difficulty concentrating, remembering, or making decisions?: No Patient able to express need for assistance with ADLs?: Yes Does the patient have difficulty dressing or bathing?: No Independently performs ADLs?: Yes (appropriate for developmental age) Does the patient have difficulty walking or climbing stairs?: No Weakness of Legs: None Weakness of Arms/Hands: None  Permission Sought/Granted Permission sought to share information with : Case Manager, Family Supports, Chartered certified accountant granted to share information with : Yes, Verbal Permission Granted  Share Information with NAME: Jana Half  Permission granted to share info w AGENCY: [facility] granted to share info w Relationship: Jana Half  Permission granted to share info w Contact Information: Jana Half [phone]  Emotional Assessment   Attitude/Demeanor/Rapport: Gracious Affect (typically observed): Calm Orientation: : Oriented to Self, Oriented to Place, Oriented to  Time, Oriented to Situation Alcohol / Substance Use: Not Applicable Psych Involvement: No (comment)  Admission diagnosis:  Acute pulmonary edema ([place]) [J81.0] Acute on chronic combined systolic and diastolic CHF (congestive heart failure) ([place]) [I50.43] Patient Active Problem List   Diagnosis Date Noted   Atrial flutter ([place]) 02/04/2022   Hypokalemia 02/04/2022   Acute on chronic combined systolic and diastolic CHF (congestive heart failure) ([place]) 02/03/2022   GERD (gastroesophageal reflux  disease) 02/03/2022   Hypothyroidism 02/03/2022   Acute pulmonary edema ([place]) 02/03/2022   Paroxysmal atrial  fibrillation with RVR ([place]) 02/03/2022   Acute on chronic combined systolic and diastolic HF (heart failure) ([place]) 01/17/2022   Non-ST elevation (NSTEMI) myocardial infarction ([place]) 01/05/2022   History of ETOH abuse 01/04/2022   Chronic diastolic CHF (congestive heart failure) ([place]) 01/04/2022   Acute respiratory failure with hypoxia ([place]) 01/04/2022   Chest pain 06/17/2016   UTI (urinary tract infection) 06/17/2016   Nonspecific abnormal electrocardiogram (ECG) (EKG) 05/06/2013   Acute bronchitis 05/06/2013   Essential hypertension 32/54/9826   Acute diastolic CHF (congestive heart failure) ([place]) 05/06/2013   Dehydration 05/05/2013   CAP (community acquired pneumonia) 05/05/2013   Orthostatic hypotension 05/05/2013   Hyponatremia 05/05/2013   PCP:  Deland Pretty, MD Pharmacy:   [id number] [place] [place] [address] [place]Phone: [phone]: [phone] [id number]- [address] [place]Phone: [phone]: [phone] [facility] [address]Phone: [phone]: [phone]    Social Determinants of Health (SDOH) Interventions    Readmission Risk Interventions     No data to display

## 2022-02-04 NOTE — Plan of Care (Signed)
  Problem: Cardiovascular: Goal: Ability to achieve and maintain adequate cardiovascular perfusion will improve Outcome: Not Progressing   Problem: Activity: Goal: Ability to return to baseline activity level will improve Outcome: Not Progressing   Problem: Safety: Goal: Ability to remain free from injury will improve Outcome: Not Progressing   Problem: Cardiac: Goal: Ability to achieve and maintain adequate cardiopulmonary perfusion will improve Outcome: Not Progressing

## 2022-02-04 NOTE — Consult Note (Signed)
Cardiology Consultation   Patient ID: JEHIELI BRASSELL MRN: [id number]; DOB: 1931-03-29  Admit date: 02/03/2022 Date of Consult: 02/04/2022  PCP:  Deland Pretty, MD   [place] Providers Cardiologist:  Mertie Moores, MD    Patient Profile:   Ellen Chandler is a 88 y.o. female with a hx of HTN, paroxysmal Afib, chronic combined systolic and diastolic HF with mildly reduced EF,GERD and hypothyroidism who is being seen 02/04/2022 for the evaluation of Afib with RVR at the request of Dr. Lonny Prude.  History of Present Illness:   Ellen Chandler is a 88 year old female with past medical history as detailed above. .   Reported she had had stress testing back in 2000 secondary to an abnormal EKG which was reportedly normal. She was previously seen back in 2014 by Dr. Meda Coffee while inpatient in the setting of acute respiratory failure which was felt to be multifactorial with respiratory infection and mild heart failure.  Echocardiogram during that admission showed LVEF of 60 to 65%, no regional wall motion abnormality, grade 1 diastolic dysfunction, mild MR. She was lost to follow-up but was seen again by Cardiology during admission in 12/2021-01/2022 where she presented with worsening dyspnea found to have acute heart failure exacerbation, pneumonia and new Afib with RVR. TTE showed drop in EF to  EF 40-45% with hypokinesis of the septal segments, apical anterior, apical inferior and apex. She underwent LHC/RHC 01/09/22 which showed nonobstructive CAD. Filling pressures were low after diuresis on RHC. For her Afib, she was started on dilt but this was stopped due to hypotension. She received 24h of amiodarone but declined further amiodarone and was ultimately placed on metop and converted back to NSR. She was amenable to start apixaban for [facility]. She was discharged home on 01/12/22. Due to hyponatremia that developed due to overdiuresis, she was discharged on lasix '20mg'$  PO daily prn.   She  re-presented on 02/03/22 with progressive dyspnea that had developed after attending a reunion where she admits she had a lot of salty foods. Labs notable for trop [phone]. BNP 884. CXR with mild pulmonary edema. She was started on lasix '40mg'$  IV BID. Her course has been complicated by recurrent Afib with episodes of RVR for which she was started on dilt gtt. This was subsequently stopped due to hypotension. Cardiology is now consulted for further management of Afib.  Currently, the patient remains in [place] with HR 100-130s. States her breathing is improving and she is laying completely flat in bed not on oxygen. No chest pain or palpitations. She is amenable to re-trialing amiodarone for now.    Past Medical History:  Diagnosis Date   Abnormal ECG    a. Pt aware of long hx of abnl ecg->h/o normal stress testing in [place] ~ 2000.   Diastolic dysfunction    a. 04/2013 Echo: EF 60-65%, No rwma, Gr 1 DD, mild MR.   GERD (gastroesophageal reflux disease)    Hypertension    Hypothyroidism     Past Surgical History:  Procedure Laterality Date   ABDOMINAL HYSTERECTOMY     JOINT REPLACEMENT     a. knee left - injury resulted after being attacked by a dog.   LEFT HEART CATH AND CORONARY ANGIOGRAPHY N/A 01/05/2022   Procedure: LEFT HEART CATH AND CORONARY ANGIOGRAPHY;  Surgeon: Belva Crome, MD;  Location: [place] [place] [facility];  Service: Cardiovascular;  Laterality: N/A;   RIGHT/LEFT HEART CATH AND CORONARY ANGIOGRAPHY N/A 01/09/2022   Procedure:  RIGHT/LEFT HEART CATH AND CORONARY ANGIOGRAPHY;  Surgeon: Early Osmond, MD;  Location: [place] [facility];  Service: Cardiovascular;  Laterality: N/A;     Home Medications:  Prior to Admission medications   Medication Sig Start Date End Date Taking? Authorizing Provider  acetaminophen (TYLENOL) 500 MG tablet Take 1,000 mg by mouth every 6 (six) hours as needed for moderate pain.   Yes [provider]  apixaban (ELIQUIS) 5 MG TABS  tablet Take 1 tablet (5 mg total) by mouth 2 (two) times daily. 01/12/22 02/11/22 Yes Donne Hazel, MD  lactose free nutrition (BOOST) LIQD Take 237 mLs by mouth at bedtime.   Yes [provider]  levothyroxine (SYNTHROID, LEVOTHROID) 88 MCG tablet Take 88 mcg by mouth daily before breakfast.   Yes [provider]  metoprolol tartrate (LOPRESSOR) 25 MG tablet Take 25 mg by mouth 2 (two) times daily.   Yes [provider]  Multiple Vitamin (MULTIVITAMIN WITH MINERALS) TABS tablet Take 1 tablet by mouth daily.   Yes [provider]  MYRBETRIQ 50 MG TB24 tablet Take 50 mg by mouth daily. 10/03/21  Yes [provider]  pantoprazole (PROTONIX) 40 MG tablet Take 40 mg by mouth 2 (two) times daily. 09/18/21  Yes [provider]  furosemide (LASIX) 20 MG tablet Take one tab daily as needed for fluid or edema Patient not taking: Reported on 02/03/2022 01/12/22   Donne Hazel, MD    Inpatient Medications: Scheduled Meds:  apixaban  5 mg Oral BID   furosemide  40 mg Intravenous BID   lactose free nutrition  237 mL Oral QHS   levothyroxine  88 mcg Oral QAC breakfast   metoprolol tartrate  25 mg Oral BID   mirabegron ER  50 mg Oral Daily   multivitamin with minerals  1 tablet Oral Daily   pantoprazole  40 mg Oral Daily   potassium chloride  40 mEq Oral BID   Continuous Infusions:  diltiazem (CARDIZEM) infusion Stopped (02/04/22 0933)   PRN Meds: ondansetron **OR** ondansetron (ZOFRAN) IV, mouth rinse  Allergies:   No Known Allergies  Social History:   Social History   Socioeconomic History   Marital status: Widowed    Spouse name: Not on file   Number of children: Not on file   Years of education: Not on file   Highest education level: Not on file  Occupational History   Not on file  Tobacco Use   Smoking status: Never   Smokeless tobacco: Never  Substance and Sexual Activity   Alcohol use: Yes   Drug use: No   Sexual activity:  Not on file  Other Topics Concern   Not on file  Social History Narrative   Moved to [place] from [place] in June 2014.  Lives at [facility].  Was exercising daily.   Social Determinants of Health   Financial Resource Strain: Not on file  Food Insecurity: Not on file  Transportation Needs: Not on file  Physical Activity: Not on file  Stress: Not on file  Social Connections: Not on file  Intimate Partner Violence: Not on file    Family History:    Family History  Problem Relation Age of Onset   Heart attack Mother        died @ 70.   Diabetes type II Father        died @ 36.   Breast cancer Sister  died @ 60.     ROS:  Please see the history of present illness.   All other ROS reviewed and negative.     Physical Exam/Data:   Vitals:   02/04/22 1100 02/04/22 1125 02/04/22 1216 02/04/22 1219  BP: 121/81 117/60 117/64   Pulse: 71 83 (!) 53 (!) 108  Resp: '18 18 17   '$ Temp:  97.6 F (36.4 C)    TempSrc:  Oral    SpO2: 95% 91% 92%   Weight:      Height:        Intake/Output Summary (Last 24 hours) at 02/04/2022 1232 Last data filed at 02/04/2022 0900 Gross per 24 hour  Intake 500 ml  Output 800 ml  Net -300 ml      02/04/2022    3:16 AM 02/03/2022    2:38 PM 02/03/2022    3:38 AM  Last 3 Weights  Weight (lbs) 141 lb 8.6 oz 139 lb 12.4 oz 140 lb 3.4 oz  Weight (kg) 64.2 kg 63.4 kg 63.6 kg     Body mass index is 24.28 kg/m.  General:  Elderly female, NAD HEENT: normal Neck: no JVD Vascular: No carotid bruits; Distal pulses 2+ bilaterally Cardiac:  Irregular, no murmurs Lungs:  bibasilar crackles. Otherwise clear Abd: soft, nontender, no hepatomegaly  Ext: no edema Musculoskeletal:  No deformities, BUE and BLE strength normal and equal Skin: warm and dry  Neuro:  CNs 2-12 intact, no focal abnormalities noted Psych:  Normal affect   EKG:  The EKG was personally reviewed and demonstrates:  Aflutter with variable  block Telemetry:  Telemetry was personally reviewed and demonstrates:  Aflutter with variable block with HR 90-130s  Relevant CV Studies: LHC 01/09/22: 1.  Normal right dominant coronary circulation with no obstructive coronary artery disease. 2.  Mean RA pressure of 1 mmHg, mean wedge pressure of 3 mmHg, cardiac output of 8.5 L/min and cardiac index of 5.1 L/min/m. 3.  LVEDP of 16 mmHg. 4.  Very tortuous right upper extremity that could not be navigated necessitating a femoral approach.   Recommendation: Goal-directed medical therapy.  TTE 01/04/22: IMPRESSIONS   1. Left ventricular ejection fraction, by estimation, is 40 to 45%. The  left ventricle has mildly decreased function. The left ventricle has no  regional wall motion abnormalities. Left ventricular diastolic parameters  are consistent with Grade I  diastolic dysfunction (impaired relaxation). Elevated left ventricular  end-diastolic pressure.   2. Right ventricular systolic function is normal. The right ventricular  size is normal. There is normal pulmonary artery systolic pressure.   3. Left atrial size was moderately dilated.   4. The mitral valve is normal in structure. Trivial mitral valve  regurgitation. No evidence of mitral stenosis.   5. The aortic valve is tricuspid. There is mild calcification of the  aortic valve. There is mild thickening of the aortic valve. Aortic valve  regurgitation is trivial. No aortic stenosis is present.   6. The inferior vena cava is normal in size with greater than 50%  respiratory variability, suggesting right atrial pressure of 3 mmHg.   Laboratory Data:  High Sensitivity Troponin:   Recent Labs  Lab 02/03/22 0315 02/03/22 0742 02/03/22 1015 02/03/22 1453  TROPONINIHS 13 173* 279* 238*     Chemistry Recent Labs  Lab 02/03/22 0315 02/04/22 0317  NA 136 138  K 4.7 3.4*  CL 103 95*  CO2 24 30  GLUCOSE 146* 100*  BUN 19 20  CREATININE 0.90  0.88  CALCIUM 9.1 9.8   GFRNONAA >60 >60  ANIONGAP 9 13    Recent Labs  Lab 02/03/22 0315  PROT 6.4*  ALBUMIN 3.5  AST 30  ALT 14  ALKPHOS 28*  BILITOT 0.9   Lipids No results for input(s): "CHOL", "TRIG", "HDL", "LABVLDL", "LDLCALC", "CHOLHDL" in the last 168 hours.  Hematology Recent Labs  Lab 02/03/22 0315  WBC 9.6  RBC 3.91  HGB 13.2  HCT 38.9  MCV 99.5  MCH 33.8  MCHC 33.9  RDW 12.0  PLT 308   Thyroid No results for input(s): "TSH", "FREET4" in the last 168 hours.  BNP Recent Labs  Lab 02/03/22 0315  BNP 884.1*    DDimer No results for input(s): "DDIMER" in the last 168 hours.   Radiology/Studies:  DG Chest Portable 1 View  Result Date: 02/03/2022 CLINICAL DATA:  Shortness of breath EXAM: PORTABLE CHEST 1 VIEW COMPARISON:  01/10/2022 FINDINGS: Cardiac shadow is prominent but stable. Large hiatal hernia is again seen. Aortic calcifications are noted. Mild central vascular congestion with interstitial edema is noted. No focal confluent infiltrate is seen. IMPRESSION: Mild CHF with interstitial edema. Large hiatal hernia. Electronically Signed   By: Inez Catalina M.D.   On: 02/03/2022 03:35     Assessment and Plan:   #Acute on chronic combined systolic and diastolic HF with mildly reduced EF: Patient with recently diagnosed systolic HF on admission 79/02-40/97. TTE at that time with LVEF 40-45% with septal and apical hypokinesis. Cath with mild disease. She returns on this admission with progressive dyspnea in the setting of dietary indiscretion found to have BNP 800s and pulmonary edema on CXR consistent with acute on chronic combined HF exacerbation. Required BiPAP initially but has since been weaned off. Responding well to IV diuresis. Wt currently 141lbs and discharge wt was 140lbs. -Give one more dose of lasix '40mg'$  IV tonight -Likely transition to PO tomorrow (may not need daily dosing as had clear trigger with dietary indiscretion and previously became hyponatremic with daily dosing  of lasix) -Continue metop '25mg'$  BID with holding parameters -Holding ARB/spiro for now given soft blood pressures; will add as able -Monitor I/Os and daily weights  #Paroxysmal Aflutter with RVR: CHADs-vasc 5. Having episodes of RVR in the setting of acute volume overload as above. Dilt stopped due to hypotension. Had previously responded well to amiodarone and is amenable to re-trialing today to help with rate control given soft blood pressures in the 80s.  -Plan for amiodarone bolus +gtt for now -Dilt stopped due to hypotension -Continue metoprolol '25mg'$  BID with blood pressure parameters -Continue apixaban '5mg'$  BID -Suspect that she will be well rate controlled/convert back to NSR once back to euvolemia   #Chest Pain: #Elevated troponin: Demand in the setting of acute volume overload as above. Cath 09/23 without obstructive disease.   Risk Assessment/Risk Scores:        New York Heart Association (NYHA) Functional Class NYHA Class III  CHA2DS2-VASc Score = 5   This indicates a 7.2% annual risk of stroke. The patient's score is based upon: CHF History: 1 HTN History: 1 Diabetes History: 0 Stroke History: 0 Vascular Disease History: 0 Age Score: 2 Gender Score: 1        For questions or updates, please contact [place] Please consult [email or web address] for contact info under    Signed, Freada Bergeron, MD  02/04/2022 12:32 PM

## 2022-02-04 NOTE — Progress Notes (Signed)
Pt converted to A-fib on telemetry with HR 125-135. Cardizem drip started at '5mg'$ /hr. No change in rate after 1hr due to HR 93 to 104.

## 2022-02-04 NOTE — Progress Notes (Signed)
PROGRESS NOTE    Ellen Chandler  GBT:[id number] DOB: June 16, 1930 DOA: 02/03/2022 PCP: Deland Pretty, MD   Brief Narrative: Ellen Chandler is a 88 y.o. female with a history of  heart failure, GERD, hypertension, hypothyroidism. Patient presented secondary to shortness of breath and found to have evidence of acute heart failure and atrial fibrillation with RVR. Prior to admission, patient reverted to sinus rhythm. Patient managed on BiPAP and give Lasix IV for her heart failure with improvement of symptoms. Supplemental oxygen weaned off. During hospitalization, patient had recurrent RVR with associated atrial fibrillation and flutter. Cardiology consulted.   Assessment and Plan: * Acute on chronic combined systolic and diastolic CHF (congestive heart failure) (HCC) Recent Transthoracic Echocardiogram from 8/31 significant for an LVEF of 40-45% with grade 1 diastolic dysfunction. Acute episode likely precipitated by dietary indiscretion. Patient with associated acute pulmonary edema which has responded well to initial diureses efforts. -Holding Lasix 40 mg IV BID this morning secondary to mild hypotension -Daily weight and strict in/out -Telemetry  Atrial flutter (HCC) Occurred overnight. -Consult cardiology  Acute pulmonary edema (HCC) See problems, Acute on chronic combined systolic and diastolic CHF (congestive heart failure) (HCC) and Acute respiratory failure with hypoxia ([place]).  Acute respiratory failure with hypoxia (HCC) Secondary to acute heart failure. Patient required BiPAP for management with subsequent improvement of symptoms after diuresis. Weaning quickly off of oxygen. -Wean oxygen to room air  Paroxysmal atrial fibrillation with RVR ([place]) Appears to have previously been diagnosed from last admission. Patient was managed on diltiazem and amiodarone on previous admission and transitioned to Lopressor. Patient also started on Eliquis for stroke prevention at that  time. Patient developed recurrent RVR during hospitalization with associated evidence of atrial flutter. Patient started on Cardizem drip with subsequent improvement of rates and hypotension; Cardizem discontinued. -Consult cardiology -Check TSH  Chest pain Symptoms present during respiratory distress. Troponin elevated and likely secondary to demand ischemia from above. Recent cardiac catheterization without evidence of obstructive CAD. Symptoms have resolved. -Trend troponin until peak  Hypothyroidism Patient is on Synthroid 88 mcg daily as an outpatient. -Continue Synthroid 88 mcg daily  GERD (gastroesophageal reflux disease) Patient is on Protonix 40 mg BID -Continue Protonix 40 mg daily  Essential hypertension Patient is on Lopressor 25 mg BID as an outpatient. -Lopressor 25 mg BID held secondary to hypotension after initiation of Cardizem drip     DVT prophylaxis: Eliquis Code Status:   Code Status: DNR Family Communication: None at bedside Disposition Plan: Discharge home likely in 1-2 days pending better control of heart failure and atrial fibrillation in addition to PT/OT recommendations   Consultants:  Cardiology  Procedures:  None  Antimicrobials: None    Subjective: Patient reports no concerns this morning. Feels better than overnight. No current chest pain or dyspnea.  Objective: BP 101/71 (BP Location: Left Arm)   Pulse 89   Temp (!) 97.5 F (36.4 C) (Oral)   Resp 17   Ht 5' 4.02" (1.626 m)   Wt 64.2 kg   SpO2 94%   BMI 24.28 kg/m   Examination:  General exam: Appears calm and comfortable Respiratory system: Clear to auscultation. Respiratory effort normal. Cardiovascular system: S1 & S2 heard, irregular rhythm with normal rate. Gastrointestinal system: Abdomen is nondistended, soft and nontender. No organomegaly or masses felt. Normal bowel sounds heard. Central nervous system: Alert and oriented. No focal neurological  deficits. Musculoskeletal: No edema. No calf tenderness Skin: No cyanosis. No rashes Psychiatry: Judgement and  insight appear normal. Mood & affect appropriate.    Data Reviewed: I have personally reviewed following labs and imaging studies  CBC Lab Results  Component Value Date   WBC 9.6 02/03/2022   RBC 3.91 02/03/2022   HGB 13.2 02/03/2022   HCT 38.9 02/03/2022   MCV 99.5 02/03/2022   MCH 33.8 02/03/2022   PLT 308 02/03/2022   MCHC 33.9 02/03/2022   RDW 12.0 02/03/2022   LYMPHSABS 3.6 02/03/2022   MONOABS 1.0 02/03/2022   EOSABS 0.4 02/03/2022   BASOSABS 0.1 93/81/0175     Last metabolic panel Lab Results  Component Value Date   NA 138 02/04/2022   K 3.4 (L) 02/04/2022   CL 95 (L) 02/04/2022   CO2 30 02/04/2022   BUN 20 02/04/2022   CREATININE 0.88 02/04/2022   GLUCOSE 100 (H) 02/04/2022   GFRNONAA >60 02/04/2022   GFRAA >60 09/20/2017   CALCIUM 9.8 02/04/2022   PROT 6.4 (L) 02/03/2022   ALBUMIN 3.5 02/03/2022   BILITOT 0.9 02/03/2022   ALKPHOS 28 (L) 02/03/2022   AST 30 02/03/2022   ALT 14 02/03/2022   ANIONGAP 13 02/04/2022    GFR: Estimated Creatinine Clearance: 37.4 mL/min (by C-G formula based on SCr of 0.88 mg/dL).  Recent Results (from the past 240 hour(s))  SARS Coronavirus 2 by RT PCR (hospital order, performed in [facility] hospital lab) *cepheid single result test* Anterior Nasal Swab     Status: None   Collection Time: 02/03/22  3:20 AM   Specimen: Anterior Nasal Swab  Result Value Ref Range Status   SARS Coronavirus 2 by RT PCR NEGATIVE NEGATIVE Final    Comment: (NOTE) SARS-CoV-2 target nucleic acids are NOT DETECTED.  The SARS-CoV-2 RNA is generally detectable in upper and lower respiratory specimens during the acute phase of infection. The lowest concentration of SARS-CoV-2 viral copies this assay can detect is 250 copies / mL. A negative result does not preclude SARS-CoV-2 infection and should not be used as the sole basis for  treatment or other patient management decisions.  A negative result may occur with improper specimen collection / handling, submission of specimen other than nasopharyngeal swab, presence of viral mutation(s) within the areas targeted by this assay, and inadequate number of viral copies (<250 copies / mL). A negative result must be combined with clinical observations, patient history, and epidemiological information.  Fact Sheet for Patients:   https://www.patel.info/  Fact Sheet for Healthcare Providers: https://hall.com/  This test is not yet approved or  cleared by the [place] FDA and has been authorized for detection and/or diagnosis of SARS-CoV-2 by FDA under an Emergency Use Authorization (EUA).  This EUA will remain in effect (meaning this test can be used) for the duration of the COVID-19 declaration under Section 564(b)(1) of the Act, 21 U.S.C. section 360bbb-3(b)(1), unless the authorization is terminated or revoked sooner.  Performed at [facility], [place] [address] [place], [place] [address]       Radiology Studies: DG Chest Portable 1 View  Result Date: 02/03/2022 CLINICAL DATA:  Shortness of breath EXAM: PORTABLE CHEST 1 VIEW COMPARISON:  01/10/2022 FINDINGS: Cardiac shadow is prominent but stable. Large hiatal hernia is again seen. Aortic calcifications are noted. Mild central vascular congestion with interstitial edema is noted. No focal confluent infiltrate is seen. IMPRESSION: Mild CHF with interstitial edema. Large hiatal hernia. Electronically Signed   By: Inez Catalina M.D.   On: 02/03/2022 03:35      LOS:  0 days    Cordelia Poche, MD [facility] 02/04/2022, 8:01 AM   If 7PM-7AM, please contact night-coverage [email or web address]

## 2022-02-05 DIAGNOSIS — J810 Acute pulmonary edema: Secondary | ICD-10-CM | POA: Diagnosis not present

## 2022-02-05 DIAGNOSIS — I5043 Acute on chronic combined systolic (congestive) and diastolic (congestive) heart failure: Secondary | ICD-10-CM | POA: Diagnosis not present

## 2022-02-05 DIAGNOSIS — I4892 Unspecified atrial flutter: Secondary | ICD-10-CM | POA: Diagnosis not present

## 2022-02-05 DIAGNOSIS — J9601 Acute respiratory failure with hypoxia: Secondary | ICD-10-CM | POA: Diagnosis not present

## 2022-02-05 LAB — BASIC METABOLIC PANEL
Anion gap: 10 (ref 5–15)
BUN: 30 mg/dL — ABNORMAL HIGH (ref 8–23)
CO2: 26 mmol/L (ref 22–32)
Calcium: 9.3 mg/dL (ref 8.9–10.3)
Chloride: 98 mmol/L (ref 98–111)
Creatinine, Ser: 1.19 mg/dL — ABNORMAL HIGH (ref 0.44–1.00)
GFR, Estimated: 44 mL/min — ABNORMAL LOW (ref >60)
Glucose, Bld: 64 mg/dL — ABNORMAL LOW (ref 70–99)
Potassium: 4.7 mmol/L (ref 3.5–5.1)
Sodium: 134 mmol/L — ABNORMAL LOW (ref 135–145)

## 2022-02-05 MED ORDER — AMIODARONE HCL 200 MG PO TABS
200.0000 mg | ORAL_TABLET | Freq: Every day | ORAL | Status: DC
  Administered 2022-02-05 – 2022-02-07 (×3): 200 mg via ORAL
  Filled 2022-02-05 (×3): qty 1

## 2022-02-05 MED ORDER — FUROSEMIDE 10 MG/ML IJ SOLN
40.0000 mg | Freq: Once | INTRAMUSCULAR | Status: AC
  Administered 2022-02-05: 40 mg via INTRAVENOUS
  Filled 2022-02-05: qty 4

## 2022-02-05 NOTE — Progress Notes (Signed)
Mobility Specialist Progress Note:   02/05/22 1140  Mobility  Activity Transferred from chair to bed  Activity Response Tolerated well  Distance Ambulated (ft) 4 ft  $Mobility charge 1 Mobility  Assistive Device Front wheel walker   Pt received in hair willing to participate in mobility. Complaints of the chair being uncomfortable. Left in bed with call bell in reach and all needs met.   [facility] Surveyor, mining Chat only

## 2022-02-05 NOTE — Progress Notes (Signed)
Heart Failure Navigator Progress Note  Following this hospitalization to assess for HV TOC readiness.   EF 40-45 %  IV Lasix AFib/RVR  Earnestine Leys, BSN, RN Heart Failure Transport planner Only

## 2022-02-05 NOTE — TOC Progression Note (Signed)
Transition of Care [facility]) - Progression Note    Patient Details  Name: Ellen Chandler MRN: [id number] Date of Birth: 06-18-1930  [facility]) CM/SW Contact  Zenon Mayo, RN Phone Number: 02/05/2022, 11:52 AM  Clinical Narrative:    NCM spoke with patient at the bedside, she is from [place] [facility], she states she has three walkers at home.  She states she has a scale but she does not weigh her self daily but will start to do better with that, she also states she has bp cuff and she has not been checking her bp, also she has been using salt on her food and have not been keeping up with how much ,  NCM informed her to try to stay away from the salt and use seasoning like Ms Deliah Boston and to eat more fresh vegetables.  She states her daughter wants her to be on a fluid pill at home because she thinks this will help her and also she would like to be checked to see if she needs oxygen. She states she just did a walk in the hall, she did sound a little sob.  NCM informed Staff RN Stanton Kidney of this information.    Expected Discharge Plan: Home/Self Care ([facility]) Barriers to Discharge: Continued Medical Work up  Expected Discharge Plan and Services Expected Discharge Plan: Home/Self Care ([place]) In-house Referral: Clinical Social Work     Living arrangements for the past 2 months: [place] ([place])                                       Social Determinants of Health (SDOH) Interventions    Readmission Risk Interventions     No data to display

## 2022-02-05 NOTE — Progress Notes (Signed)
PROGRESS NOTE    Ellen Chandler  TKZ:[id number] DOB: 09/30/1930 DOA: 02/03/2022 PCP: Deland Pretty, MD   Brief Narrative: Ellen Chandler is a 88 y.o. female with a history of  heart failure, GERD, hypertension, hypothyroidism. Patient presented secondary to shortness of breath and found to have evidence of acute heart failure and atrial fibrillation with RVR. Prior to admission, patient reverted to sinus rhythm. Patient managed on BiPAP and give Lasix IV for her heart failure with improvement of symptoms. Supplemental oxygen weaned off. During hospitalization, patient had recurrent RVR with associated atrial fibrillation and flutter. Cardiology consulted.   Assessment and Plan: * Acute on chronic combined systolic and diastolic CHF (congestive heart failure) ([place]) Likely secondary to recent vacation and dietary indiscretion.Recent Transthoracic Echocardiogram from 8/31 significant for an LVEF of 40-45% with grade 1 diastolic dysfunction. Acute episode likely precipitated by dietary indiscretion. Patient with associated acute pulmonary edema which has responded well to initial diureses efforts. -Cardiology recommendations: Lasix IV -Daily weight and strict in/out -Telemetry  Atrial flutter ([place]) Occurred overnight. -Consult cardiology  Acute pulmonary edema ([place]) See problems, Acute on chronic combined systolic and diastolic CHF (congestive heart failure) ([place]) and Acute respiratory failure with hypoxia ([place]).  Acute respiratory failure with hypoxia ([facility]) Secondary to acute heart failure. Patient required BiPAP for management with subsequent improvement of symptoms after diuresis. Weaning quickly off of oxygen. -Wean oxygen to room air  Paroxysmal atrial fibrillation with RVR ([place]) Appears to have previously been diagnosed from last admission. Patient was managed on diltiazem and amiodarone on previous admission and transitioned to Lopressor. Patient also started on Eliquis for  stroke prevention at that time. Patient developed recurrent RVR during hospitalization with associated evidence of atrial flutter. Patient started on Cardizem drip with subsequent improvement of rates and hypotension; Cardizem discontinued. TSH slightly elevated. Cardiology started amiodarone. Patient converted to sinus rhythm. -Cardiology recommendations: metoprolol and amiodarone  Chest pain Symptoms present during respiratory distress. Troponin elevated and likely secondary to demand ischemia from above. Recent cardiac catheterization without evidence of obstructive CAD. Symptoms have resolved.  Hypothyroidism Patient is on Synthroid 88 mcg daily as an outpatient. -Continue Synthroid 88 mcg daily  GERD (gastroesophageal reflux disease) Patient is on Protonix 40 mg BID -Continue Protonix 40 mg daily  Essential hypertension Patient is on Lopressor 25 mg BID as an outpatient. -Lopressor 25 mg BID held secondary to hypotension after initiation of Cardizem drip     DVT prophylaxis: Eliquis Code Status:   Code Status: DNR Family Communication: None at bedside Disposition Plan: Discharge home likely in 1-2 days pending better control of heart failure and atrial fibrillation in addition to PT/OT recommendations   Consultants:  Cardiology  Procedures:  None  Antimicrobials: None    Subjective: Patient without major concerns but did not sleep well today. Placed back on oxygen for unknown reason.  Objective: BP (!) 114/56 (BP Location: Left Arm)   Pulse (!) 55   Temp 98 F (36.7 C) (Oral)   Resp 18   Ht 5' 4.02" (1.626 m)   Wt 62.5 kg   SpO2 97%   BMI 23.64 kg/m   Examination:  General exam: Appears calm and comfortable. Appears fatigued. Respiratory system: Respiratory effort normal. Cardiovascular system: S1 & S2 heard, RRR.  Gastrointestinal system: Abdomen is nondistended, soft and nontender. Normal bowel sounds heard. Central nervous system: Alert and oriented.  No focal neurological deficits. Musculoskeletal: No calf tenderness Skin: No cyanosis. No rashes Psychiatry: Judgement and insight appear  normal. Mood & affect appropriate.    Data Reviewed: I have personally reviewed following labs and imaging studies  CBC Lab Results  Component Value Date   WBC 9.6 02/03/2022   RBC 3.91 02/03/2022   HGB 13.2 02/03/2022   HCT 38.9 02/03/2022   MCV 99.5 02/03/2022   MCH 33.8 02/03/2022   PLT 308 02/03/2022   MCHC 33.9 02/03/2022   RDW 12.0 02/03/2022   LYMPHSABS 3.6 02/03/2022   MONOABS 1.0 02/03/2022   EOSABS 0.4 02/03/2022   BASOSABS 0.1 40/98/1191     Last metabolic panel Lab Results  Component Value Date   NA 138 02/04/2022   K 3.4 (L) 02/04/2022   CL 95 (L) 02/04/2022   CO2 30 02/04/2022   BUN 20 02/04/2022   CREATININE 0.88 02/04/2022   GLUCOSE 100 (H) 02/04/2022   GFRNONAA >60 02/04/2022   GFRAA >60 09/20/2017   CALCIUM 9.8 02/04/2022   PROT 6.4 (L) 02/03/2022   ALBUMIN 3.5 02/03/2022   BILITOT 0.9 02/03/2022   ALKPHOS 28 (L) 02/03/2022   AST 30 02/03/2022   ALT 14 02/03/2022   ANIONGAP 13 02/04/2022    GFR: Estimated Creatinine Clearance: 37.4 mL/min (by C-G formula based on SCr of 0.88 mg/dL).  Recent Results (from the past 240 hour(s))  SARS Coronavirus 2 by RT PCR (hospital order, performed in [facility] hospital lab) *cepheid single result test* Anterior Nasal Swab     Status: None   Collection Time: 02/03/22  3:20 AM   Specimen: Anterior Nasal Swab  Result Value Ref Range Status   SARS Coronavirus 2 by RT PCR NEGATIVE NEGATIVE Final    Comment: (NOTE) SARS-CoV-2 target nucleic acids are NOT DETECTED.  The SARS-CoV-2 RNA is generally detectable in upper and lower respiratory specimens during the acute phase of infection. The lowest concentration of SARS-CoV-2 viral copies this assay can detect is 250 copies / mL. A negative result does not preclude SARS-CoV-2 infection and should not be used as the sole  basis for treatment or other patient management decisions.  A negative result may occur with improper specimen collection / handling, submission of specimen other than nasopharyngeal swab, presence of viral mutation(s) within the areas targeted by this assay, and inadequate number of viral copies (<250 copies / mL). A negative result must be combined with clinical observations, patient history, and epidemiological information.  Fact Sheet for Patients:   https://www.patel.info/  Fact Sheet for Healthcare Providers: https://hall.com/  This test is not yet approved or  cleared by the [place] FDA and has been authorized for detection and/or diagnosis of SARS-CoV-2 by FDA under an Emergency Use Authorization (EUA).  This EUA will remain in effect (meaning this test can be used) for the duration of the COVID-19 declaration under Section 564(b)(1) of the Act, 21 U.S.C. section 360bbb-3(b)(1), unless the authorization is terminated or revoked sooner.  Performed at [facility], [place] [address], [place], [place] [address]       Radiology Studies: No results found.    LOS: 1 day    Cordelia Poche, MD [facility] 02/05/2022, 8:43 AM   If 7PM-7AM, please contact night-coverage [email or web address]

## 2022-02-05 NOTE — Progress Notes (Addendum)
Rounding Note    Patient Name: Ellen Chandler Date of Encounter: 02/05/2022  [place] Cardiologist: Mertie Moores, MD   Subjective   Felt better once converted. No chest pain or dyspnea.   Inpatient Medications    Scheduled Meds:  apixaban  5 mg Oral BID   lactose free nutrition  237 mL Oral QHS   levothyroxine  88 mcg Oral QAC breakfast   metoprolol tartrate  25 mg Oral BID   mirabegron ER  50 mg Oral Daily   multivitamin with minerals  1 tablet Oral Daily   pantoprazole  40 mg Oral Daily   Continuous Infusions:  amiodarone Stopped (02/05/22 0814)   PRN Meds: ondansetron **OR** ondansetron (ZOFRAN) IV, mouth rinse   Vital Signs    Vitals:   02/04/22 1930 02/04/22 2013 02/05/22 0426 02/05/22 0724  BP: 90/70 100/68 (!) 106/58 (!) 114/56  Pulse: 99 74 (!) 57 (!) 55  Resp: (!) '22 18 16 18  '$ Temp: 97.8 F (36.6 C)  98 F (36.7 C) 98 F (36.7 C)  TempSrc: Oral  Oral Oral  SpO2: 94% 93% 95% 97%  Weight:   62.5 kg   Height:        Intake/Output Summary (Last 24 hours) at 02/05/2022 1102 Last data filed at 02/05/2022 0835 Gross per 24 hour  Intake 704.5 ml  Output 1850 ml  Net -1145.5 ml      02/05/2022    4:26 AM 02/04/2022    3:16 AM 02/03/2022    2:38 PM  Last 3 Weights  Weight (lbs) 137 lb 12.6 oz 141 lb 8.6 oz 139 lb 12.4 oz  Weight (kg) 62.5 kg 64.2 kg 63.4 kg      Telemetry    NSR - Personally Reviewed  ECG    N/A  Physical Exam   GEN: No acute distress.   Neck: No JVD Cardiac: RRR, no murmurs, rubs, or gallops.  Respiratory: Clear to auscultation bilaterally. GI: Soft, nontender, non-distended  MS: No edema; No deformity. Neuro:  Nonfocal  Psych: Normal affect   Labs    High Sensitivity Troponin:   Recent Labs  Lab 02/03/22 0315 02/03/22 0742 02/03/22 1015 02/03/22 1453  TROPONINIHS 13 173* 279* 238*     Chemistry Recent Labs  Lab 02/03/22 0315 02/04/22 0317  NA 136 138  K 4.7 3.4*  CL 103 95*  CO2 24  30  GLUCOSE 146* 100*  BUN 19 20  CREATININE 0.90 0.88  CALCIUM 9.1 9.8  PROT 6.4*  --   ALBUMIN 3.5  --   AST 30  --   ALT 14  --   ALKPHOS 28*  --   BILITOT 0.9  --   GFRNONAA >60 >60  ANIONGAP 9 13    Lipids No results for input(s): "CHOL", "TRIG", "HDL", "LABVLDL", "LDLCALC", "CHOLHDL" in the last 168 hours.  Hematology Recent Labs  Lab 02/03/22 0315  WBC 9.6  RBC 3.91  HGB 13.2  HCT 38.9  MCV 99.5  MCH 33.8  MCHC 33.9  RDW 12.0  PLT 308   Thyroid  Recent Labs  Lab 02/04/22 0350  TSH 4.788*    BNP Recent Labs  Lab 02/03/22 0315  BNP 884.1*    DDimer No results for input(s): "DDIMER" in the last 168 hours.   Radiology    No results found.  Cardiac Studies   RIGHT/LEFT HEART CATH AND CORONARY ANGIOGRAPHY  01/09/2022   Conclusion  1.  Normal right dominant  coronary circulation with no obstructive coronary artery disease. 2.  Mean RA pressure of 1 mmHg, mean wedge pressure of 3 mmHg, cardiac output of 8.5 L/min and cardiac index of 5.1 L/min/m. 3.  LVEDP of 16 mmHg. 4.  Very tortuous right upper extremity that could not be navigated necessitating a femoral approach.   Recommendation: Goal-directed medical therapy.  Echo 01/04/22 1. Left ventricular ejection fraction, by estimation, is 40 to 45%. The  left ventricle has mildly decreased function. The left ventricle has no  regional wall motion abnormalities. Left ventricular diastolic parameters  are consistent with Grade I  diastolic dysfunction (impaired relaxation). Elevated left ventricular  end-diastolic pressure.   2. Right ventricular systolic function is normal. The right ventricular  size is normal. There is normal pulmonary artery systolic pressure.   3. Left atrial size was moderately dilated.   4. The mitral valve is normal in structure. Trivial mitral valve  regurgitation. No evidence of mitral stenosis.   5. The aortic valve is tricuspid. There is mild calcification of the  aortic  valve. There is mild thickening of the aortic valve. Aortic valve  regurgitation is trivial. No aortic stenosis is present.   6. The inferior vena cava is normal in size with greater than 50%  respiratory variability, suggesting right atrial pressure of 3 mmHg.   Patient Profile     88 y.o. female with a hx of HTN, paroxysmal Afib, chronic combined systolic and diastolic HF with mildly reduced EF,GERD and hypothyroidism who is being seen for the evaluation of Afib with RVR at the request of Dr. Lonny Prude.  Assessment & Plan    #Acute on chronic combined systolic and diastolic HF with mildly reduced EF: Patient with recently diagnosed systolic HF on admission 16/10-96/04. TTE at that time with LVEF 40-45% with septal and apical hypokinesis. Cath with mild disease. She returns on this admission with progressive dyspnea in the setting of dietary indiscretion found to have BNP 800s and pulmonary edema on CXR consistent with acute on chronic combined HF exacerbation. Required BiPAP initially but has since been weaned off. Responding well to IV diuresis.  - Net I & O -2L. Weight down 140>>>137lb.  - BP soft, improving - Continue metoprolol tartrate '25mg'$  BID (could reduce to 12.'5mg'$  BID) - Consider daily or PRN lasix - Limited GDMT due to blood pressure   #Paroxysmal Aflutter with RVR: CHADs-vasc 5. Having episodes of RVR in the setting of acute volume overload as above. Dilt stopped due to hypotension. Had previously responded well to amiodarone and is amenable to re-trialing today to help with rate control given soft blood pressures in the 80s.  -Treated with amiodarone bolus +gtt >> converted to sinus rhythm overnight. Off amiodarone.  - Will need po dose for rhythm maintenance (will review with MD). Felt poor with afib. TSH minimally up and on long term thyroid supplement.  -Continue apixaban '5mg'$  BID    #Chest Pain: #Elevated troponin: Demand in the setting of acute volume overload as above.  Cath 09/23 without obstructive disease.    For questions or updates, please contact [place] Please consult [email or web address] for contact info under        SignedLeanor Kail, PA  02/05/2022, 11:02 AM     Attending Note:   The patient was seen and examined.  Agree with assessment and plan as noted above.  Changes made to the above note as needed.  Patient seen and independently examined with Robbie Lis, PA .  We discussed all aspects of the encounter. I agree with the assessment and plan as stated above.    Acute on chronic combined systolic and diastolic congestive heart failure: Patient has mildly reduced ejection fraction.  She is diuresed nicely.  She still has shortness of breath with any sort of exertion. Continue IV Lasix today.  We will consider transitioning to oral Lasix tomorrow.  2.  Atrial fibrillation: The IV amiodarone has been stopped.  We will start amiodarone 200 mg a day.  Continue metoprolol for rate control.  Continue Eliquis for anticoagulation    I have spent a total of 40 minutes with patient reviewing hospital  notes , telemetry, EKGs, labs and examining patient as well as establishing an assessment and plan that was discussed with the patient.  > 50% of time was spent in direct patient care.    Thayer Headings, Brooke Bonito., MD, [facility] 02/05/2022, 12:17 PM [address],  [place] Pager [phone]

## 2022-02-05 NOTE — Progress Notes (Signed)
Physical Therapy Treatment Patient Details Name: EMMERSEN GARRAWAY MRN: [id number] DOB: 09/04/1930 Today's Date: 02/05/2022   History of Present Illness 88 yo female admitted 02/03/22 with SOB and substernal chest pain. + CHF exacerbation and acute pulmonary edema. Pt with afib overnight on 02/02/22, started Cardizem drip. PMH: CHF, HTN, hypothyroidism, GERD.    PT Comments    Pt self limiting on arrival stating wanting to go home but that she didn't think she could get up. Pt able to walk in hall on RA with sats 90-94% and HR 65-72 bpm. Pt encouraged to get to the bathroom with nursing rather than rely on purewick. Pt educated for HEP, walking program and progression with plan appropriate. Will continue to follow.    Recommendations for follow up therapy are one component of a multi-disciplinary discharge planning process, led by the attending physician.  Recommendations may be updated based on patient status, additional functional criteria and insurance authorization.  Follow Up Recommendations  Home health PT     Assistance Recommended at Discharge Intermittent Supervision/Assistance  Patient can return home with the following Assistance with cooking/housework;Help with stairs or ramp for entrance;Assist for transportation   Equipment Recommendations  None recommended by PT    Recommendations for Other Services       Precautions / Restrictions Precautions Precautions: Fall Restrictions Weight Bearing Restrictions: No     Mobility  Bed Mobility   Bed Mobility: Supine to Sit     Supine to sit: Supervision     General bed mobility comments: HOB 15 degrees with pt able to pivot to right side without physical assist, guarding for lines    Transfers Overall transfer level: Needs assistance   Transfers: Sit to/from Stand Sit to Stand: Min guard           General transfer comment: cues for hand placement    Ambulation/Gait Ambulation/Gait assistance: Min  guard Gait Distance (Feet): 250 Feet Assistive device: Rolling walker (2 wheels) Gait Pattern/deviations: Step-through pattern, Decreased stride length   Gait velocity interpretation: 1.31 - 2.62 ft/sec, indicative of limited community ambulator   General Gait Details: cues for posture and proximity to [employer]    Modified Rankin (Stroke Patients Only)       Balance Overall balance assessment: Needs assistance Sitting-balance support: No upper extremity supported, Feet supported Sitting balance-Leahy Scale: Good     Standing balance support: Bilateral upper extremity supported Standing balance-Leahy Scale: Poor Standing balance comment: bil UE support on RW in standing                            Cognition Arousal/Alertness: Awake/alert Behavior During Therapy: WFL for tasks assessed/performed Overall Cognitive Status: Within Functional Limits for tasks assessed                                          Exercises General Exercises - Lower Extremity Long Arc Quad: AROM, Both, Seated, 20 reps Hip ABduction/ADduction: AROM, Both, Seated, 20 reps Hip Flexion/Marching: AROM, Both, Seated, 20 reps    General Comments        Pertinent Vitals/Pain Pain Assessment Pain Assessment: No/denies pain    Home Living  Prior Function            PT Goals (current goals can now be found in the care plan section) Progress towards PT goals: Progressing toward goals    Frequency    Min 3X/week      PT Plan Current plan remains appropriate    Co-evaluation              AM-PAC PT "6 Clicks" Mobility   Outcome Measure  Help needed turning from your back to your side while in a flat bed without using bedrails?: None Help needed moving from lying on your back to sitting on the side of a flat bed without using bedrails?: None Help needed moving to and from a bed  to a chair (including a wheelchair)?: A Little Help needed standing up from a chair using your arms (e.g., wheelchair or bedside chair)?: A Little Help needed to walk in hospital room?: A Little Help needed climbing 3-5 steps with a railing? : A Lot 6 Click Score: 19    End of Session   Activity Tolerance: Patient tolerated treatment well Patient left: in chair;with call bell/phone within reach;with chair alarm set Nurse Communication: Mobility status PT Visit Diagnosis: Other abnormalities of gait and mobility (R26.89);Difficulty in walking, not elsewhere classified (R26.2)     Time: 9811-9147 PT Time Calculation (min) (ACUTE ONLY): 24 min  Charges:  $Gait Training: 8-22 mins $Therapeutic Exercise: 8-22 mins                     Bayard Males, PT Acute Rehabilitation Services Office: [place] 02/05/2022, 11:54 AM

## 2022-02-05 NOTE — Progress Notes (Signed)
OT Cancellation Note  Patient Details Name: Ellen Chandler MRN: [id number] DOB: 1931/03/25   Cancelled Treatment:    Reason Eval/Treat Not Completed: Fatigue/lethargy limiting ability to participatePt sleeping on entry, did not awaken to voice. Will follow up for OT session as schedule permits, likely tomorrow 10/3.  Layla Maw 02/05/2022, 2:34 PM

## 2022-02-06 ENCOUNTER — Other Ambulatory Visit (HOSPITAL_COMMUNITY): Payer: Self-pay

## 2022-02-06 ENCOUNTER — Encounter (HOSPITAL_COMMUNITY): Payer: Self-pay | Admitting: Family Medicine

## 2022-02-06 DIAGNOSIS — N179 Acute kidney failure, unspecified: Secondary | ICD-10-CM | POA: Diagnosis present

## 2022-02-06 DIAGNOSIS — I5043 Acute on chronic combined systolic (congestive) and diastolic (congestive) heart failure: Secondary | ICD-10-CM | POA: Diagnosis not present

## 2022-02-06 DIAGNOSIS — I4892 Unspecified atrial flutter: Secondary | ICD-10-CM | POA: Diagnosis not present

## 2022-02-06 DIAGNOSIS — J810 Acute pulmonary edema: Secondary | ICD-10-CM | POA: Diagnosis not present

## 2022-02-06 DIAGNOSIS — J9601 Acute respiratory failure with hypoxia: Secondary | ICD-10-CM | POA: Diagnosis not present

## 2022-02-06 LAB — BASIC METABOLIC PANEL
Anion gap: 11 (ref 5–15)
BUN: 35 mg/dL — ABNORMAL HIGH (ref 8–23)
CO2: 30 mmol/L (ref 22–32)
Calcium: 9.5 mg/dL (ref 8.9–10.3)
Chloride: 93 mmol/L — ABNORMAL LOW (ref 98–111)
Creatinine, Ser: 1.35 mg/dL — ABNORMAL HIGH (ref 0.44–1.00)
GFR, Estimated: 38 mL/min — ABNORMAL LOW (ref >60)
Glucose, Bld: 105 mg/dL — ABNORMAL HIGH (ref 70–99)
Potassium: 4.4 mmol/L (ref 3.5–5.1)
Sodium: 134 mmol/L — ABNORMAL LOW (ref 135–145)

## 2022-02-06 NOTE — Progress Notes (Signed)
   02/06/22 1521  Mobility  Activity Ambulated with assistance in room  Activity Response Tolerated poorly  Distance Ambulated (ft) 2 ft  $Mobility charge 1 Mobility  Level of Assistance Contact guard assist, steadying assist  Assistive Device Front wheel walker  Mobility Referral Yes   Mobility Specialist Progress Note  Pre-Mobility:  96%SpO2 (RA)  During Mobility: 78% SpO2 (6L/Min O2) Post-Mobility: 95% SpO2 (RA)  Pt was in bed and agreeable. Mobility cut short d/t SpO2 dropping to the 70s while ambulating with 6L/min O2. Returned to bed w/ all needs met and call bell in reach. RN notified.   Lucious Groves Mobility Specialist

## 2022-02-06 NOTE — Progress Notes (Signed)
PROGRESS NOTE    Ellen Chandler  YTK:[id number] DOB: Jul 08, 1930 DOA: 02/03/2022 PCP: Deland Pretty, MD   Brief Narrative: Ellen Chandler is a 88 y.o. female with a history of  heart failure, GERD, hypertension, hypothyroidism. Patient presented secondary to shortness of breath and found to have evidence of acute heart failure and atrial fibrillation with RVR. Prior to admission, patient reverted to sinus rhythm. Patient managed on BiPAP and give Lasix IV for her heart failure with improvement of symptoms. Supplemental oxygen weaned off. During hospitalization, patient had recurrent RVR with associated atrial fibrillation and flutter. Cardiology consulted.   Assessment and Plan: * Acute on chronic combined systolic and diastolic CHF (congestive heart failure) ([place]) Likely secondary to recent vacation and dietary indiscretion.Recent Transthoracic Echocardiogram from 8/31 significant for an LVEF of 40-45% with grade 1 diastolic dysfunction. Acute episode likely precipitated by dietary indiscretion. Patient with associated acute pulmonary edema which has responded well to initial diureses efforts. -Cardiology recommendations: holding Lasix IV secondary to AKI -Daily weight and strict in/out -Telemetry  Atrial flutter [facility]) Cardiology consulted.  Acute pulmonary edema (HCC) See problems, Acute on chronic combined systolic and diastolic CHF (congestive heart failure) ([place]) and Acute respiratory failure with hypoxia ([place]).  Acute respiratory failure with hypoxia (HCC) Secondary to acute heart failure. Patient required BiPAP for management with subsequent improvement of symptoms after diuresis. Weaning quickly off of oxygen. -Wean oxygen to room air  Paroxysmal atrial fibrillation with RVR ([place]) Appears to have previously been diagnosed from last admission. Patient was managed on diltiazem and amiodarone on previous admission and transitioned to Lopressor. Patient also started on Eliquis  for stroke prevention at that time. Patient developed recurrent RVR during hospitalization with associated evidence of atrial flutter. Patient started on Cardizem drip with subsequent improvement of rates and hypotension; Cardizem discontinued. TSH slightly elevated. Cardiology started amiodarone. Patient converted to sinus rhythm. -Cardiology recommendations: metoprolol and amiodarone  Chest pain Symptoms present during respiratory distress. Troponin elevated and likely secondary to demand ischemia from above. Recent cardiac catheterization without evidence of obstructive CAD. Symptoms have resolved.  Hypothyroidism Patient is on Synthroid 88 mcg daily as an outpatient. -Continue Synthroid 88 mcg daily  GERD (gastroesophageal reflux disease) Patient is on Protonix 40 mg BID -Continue Protonix 40 mg daily  Essential hypertension Patient is on Lopressor 25 mg BID as an outpatient. -Continue Lopressor  AKI (acute kidney injury) ([place]) Secondary to Lasix IV overdiuresis. Lasix held. -BMP in AM    DVT prophylaxis: Eliquis Code Status:   Code Status: DNR Family Communication: None at bedside Disposition Plan: Discharge home likely in 1 days per cardiology recommendations   Consultants:  Cardiology  Procedures:  None  Antimicrobials: None    Subjective: No issues overnight.  Objective: BP (!) 105/51 (BP Location: Left Arm)   Pulse 62   Temp 98.4 F (36.9 C) (Oral)   Resp 18   Ht 5' 4.02" (1.626 m)   Wt 63 kg   SpO2 94%   BMI 23.83 kg/m   Examination:  General exam: Appears calm and comfortable Respiratory system: Clear to auscultation. Respiratory effort normal. Cardiovascular system: S1 & S2 heard, RRR. No murmurs, rubs, gallops or clicks. Gastrointestinal system: Abdomen is nondistended, soft and nontender. Normal bowel sounds heard. Central nervous system: Alert and oriented. No focal neurological deficits. Musculoskeletal: No calf tenderness Skin: No  cyanosis. No rashes Psychiatry: Judgement and insight appear normal. Mood & affect appropriate.    Data Reviewed: I have personally reviewed  following labs and imaging studies  CBC Lab Results  Component Value Date   WBC 9.6 02/03/2022   RBC 3.91 02/03/2022   HGB 13.2 02/03/2022   HCT 38.9 02/03/2022   MCV 99.5 02/03/2022   MCH 33.8 02/03/2022   PLT 308 02/03/2022   MCHC 33.9 02/03/2022   RDW 12.0 02/03/2022   LYMPHSABS 3.6 02/03/2022   MONOABS 1.0 02/03/2022   EOSABS 0.4 02/03/2022   BASOSABS 0.1 09/32/3557     Last metabolic panel Lab Results  Component Value Date   NA 134 (L) 02/06/2022   K 4.4 02/06/2022   CL 93 (L) 02/06/2022   CO2 30 02/06/2022   BUN 35 (H) 02/06/2022   CREATININE 1.35 (H) 02/06/2022   GLUCOSE 105 (H) 02/06/2022   GFRNONAA 38 (L) 02/06/2022   GFRAA >60 09/20/2017   CALCIUM 9.5 02/06/2022   PROT 6.4 (L) 02/03/2022   ALBUMIN 3.5 02/03/2022   BILITOT 0.9 02/03/2022   ALKPHOS 28 (L) 02/03/2022   AST 30 02/03/2022   ALT 14 02/03/2022   ANIONGAP 11 02/06/2022    GFR: Estimated Creatinine Clearance: 24.4 mL/min (A) (by C-G formula based on SCr of 1.35 mg/dL (H)).  Recent Results (from the past 240 hour(s))  SARS Coronavirus 2 by RT PCR (hospital order, performed in [facility] hospital lab) *cepheid single result test* Anterior Nasal Swab     Status: None   Collection Time: 02/03/22  3:20 AM   Specimen: Anterior Nasal Swab  Result Value Ref Range Status   SARS Coronavirus 2 by RT PCR NEGATIVE NEGATIVE Final    Comment: (NOTE) SARS-CoV-2 target nucleic acids are NOT DETECTED.  The SARS-CoV-2 RNA is generally detectable in upper and lower respiratory specimens during the acute phase of infection. The lowest concentration of SARS-CoV-2 viral copies this assay can detect is 250 copies / mL. A negative result does not preclude SARS-CoV-2 infection and should not be used as the sole basis for treatment or other patient management decisions.  A  negative result may occur with improper specimen collection / handling, submission of specimen other than nasopharyngeal swab, presence of viral mutation(s) within the areas targeted by this assay, and inadequate number of viral copies (<250 copies / mL). A negative result must be combined with clinical observations, patient history, and epidemiological information.  Fact Sheet for Patients:   https://www.patel.info/  Fact Sheet for Healthcare Providers: https://hall.com/  This test is not yet approved or  cleared by the [place] FDA and has been authorized for detection and/or diagnosis of SARS-CoV-2 by FDA under an Emergency Use Authorization (EUA).  This EUA will remain in effect (meaning this test can be used) for the duration of the COVID-19 declaration under Section 564(b)(1) of the Act, 21 U.S.C. section 360bbb-3(b)(1), unless the authorization is terminated or revoked sooner.  Performed at [facility], [place] [address], [place], [place] [address]       Radiology Studies: No results found.    LOS: 2 days    Cordelia Poche, MD [facility] 02/06/2022, 5:05 PM   If 7PM-7AM, please contact night-coverage [email or web address]

## 2022-02-06 NOTE — Progress Notes (Signed)
Physical Therapy Treatment Patient Details Name: Ellen Chandler MRN: [id number] DOB: [birth year] Today's Date: 02/06/2022   History of Present Illness 88 yo female admitted 02/03/22 with SOB and substernal chest pain. + CHF exacerbation and acute pulmonary edema. Pt with afib overnight on 02/02/22, started Cardizem drip. PMH: CHF, HTN, hypothyroidism, GERD.    PT Comments    Patient reports feeling very tired and fatigued today. "I have been up all morning," despite only have just gotten up with OT prior to PT arrival. Requires Chandler guard assist for standing and for short distance ambulation with use of RW for support. Pt self limiting today due to fatigue and SOB. Sp02 remained >93% on RA with activity. Pt reports she would like supplemental 02 at home. Per nurse, pt's daughter in law stays with her most of the day, which will be helpful due to pt's poor endurance and activity tolerance at this time. Continue to recommend HHPT. Will follow.    Recommendations for follow up therapy are one component of a multi-disciplinary discharge planning process, led by the attending physician.  Recommendations may be updated based on patient status, additional functional criteria and insurance authorization.  Follow Up Recommendations  Home health PT     Assistance Recommended at Discharge Intermittent Supervision/Assistance  Patient can return home with the following Assistance with cooking/housework;Help with stairs or ramp for entrance;Assist for transportation   Equipment Recommendations  None recommended by PT    Recommendations for Other Services       Precautions / Restrictions Precautions Precautions: Fall;Other (comment) Precaution Comments: watch BP, HR Restrictions Weight Bearing Restrictions: No     Mobility  Bed Mobility Overal bed mobility: Needs Assistance Bed Mobility: Sit to Supine       Sit to supine: Supervision, HOB elevated   General bed mobility comments: Able to  bring LEs into bed to return to supine.    Transfers Overall transfer level: Needs assistance Equipment used: Rolling walker (2 wheels) Transfers: Sit to/from Stand Sit to Stand: Chandler guard           General transfer comment: Chandler guard for safety. Stood from chair x1, slow to rise.    Ambulation/Gait Ambulation/Gait assistance: Chandler guard Gait Distance (Feet): 30 Feet Assistive device: Rolling walker (2 wheels) Gait Pattern/deviations: Step-through pattern, Decreased stride length Gait velocity: decreased Gait velocity interpretation: <1.8 ft/sec, indicate of risk for recurrent falls   General Gait Details: Slow, mostly steady gait with RW for support. Very fatigued today. Sp02 rmained >93% on RA with 2-3/4 DOE.   Stairs             Wheelchair Mobility    Modified Rankin (Stroke Patients Only)       Balance Overall balance assessment: Needs assistance Sitting-balance support: No upper extremity supported, Feet supported Sitting balance-Leahy Scale: Good     Standing balance support: During functional activity Standing balance-Leahy Scale: Poor Standing balance comment: bil UE support on RW in standing                            Cognition Arousal/Alertness: Lethargic Behavior During Therapy: WFL for tasks assessed/performed Overall Cognitive Status: Within Functional Limits for tasks assessed                                          Exercises  General Comments General comments (skin integrity, edema, etc.): SpO2 varing widely during OOB activity but unreliable pleth reading during session. suspect d/t sensor getting wet during oral care. Pt reporting no lightheadedness or SOB during OOB activity      Pertinent Vitals/Pain Pain Assessment Pain Assessment: No/denies pain    Home Living                          Prior Function            PT Goals (current goals can now be found in the care plan section)  Progress towards PT goals: Not progressing toward goals - comment (due to fatigue)    Frequency    Chandler 3X/week      PT Plan Current plan remains appropriate    Co-evaluation              AM-PAC PT "6 Clicks" Mobility   Outcome Measure  Help needed turning from your back to your side while in a flat bed without using bedrails?: None Help needed moving from lying on your back to sitting on the side of a flat bed without using bedrails?: None Help needed moving to and from a bed to a chair (including a wheelchair)?: A Little Help needed standing up from a chair using your arms (e.g., wheelchair or bedside chair)?: A Little Help needed to walk in hospital room?: A Little Help needed climbing 3-5 steps with a railing? : A Lot 6 Click Score: 19    End of Session Equipment Utilized During Treatment: Gait belt Activity Tolerance: Patient limited by fatigue Patient left: in bed;with call bell/phone within reach;with bed alarm set;Other (comment) (with MD present) Nurse Communication: Mobility status PT Visit Diagnosis: Other abnormalities of gait and mobility (R26.89);Difficulty in walking, not elsewhere classified (R26.2)     Time: 6256-3893 PT Time Calculation (Chandler) (ACUTE ONLY): 12 Chandler  Charges:  $Gait Training: 8-22 mins                     Marisa Severin, PT, DPT Acute Rehabilitation Services Secure chat preferred Office [place] 02/06/2022, 11:08 AM

## 2022-02-06 NOTE — Progress Notes (Signed)
RN notified by mobility specialist that patient's O2 sats dropped when she was sitting and standing up. Upon reassessment, patient's O2 was 94% and she was visiting with her family at bedside. Upon RN reassessing patient's O2 sitting and standing, patient O2 sat was 90% or better on room air. Patient resting in bed with call bell within reach.

## 2022-02-06 NOTE — Progress Notes (Signed)
Occupational Therapy Treatment Patient Details Name: Ellen Chandler MRN: [id number] DOB: 1930-11-07 Today's Date: 02/06/2022   History of present illness 88 yo female admitted 02/03/22 with SOB and substernal chest pain. + CHF exacerbation and acute pulmonary edema. Pt with afib overnight on 02/02/22, started Cardizem drip. PMH: CHF, HTN, hypothyroidism, GERD.   OT comments  Pt progressing towards acute OT goals. Focus of session was OOB ADLs. Stood to brush teeth then completed toilet transfer and pericare. Up in recliner at end of session. Discussed DME/AE recommendations as detailed below. D/c recommendation remains appropriate.    Recommendations for follow up therapy are one component of a multi-disciplinary discharge planning process, led by the attending physician.  Recommendations may be updated based on patient status, additional functional criteria and insurance authorization.    Follow Up Recommendations  Home health OT    Assistance Recommended at Discharge Intermittent Supervision/Assistance  Patient can return home with the following  Assistance with cooking/housework;Assist for transportation;Help with stairs or ramp for entrance   Equipment Recommendations  None recommended by OT    Recommendations for Other Services      Precautions / Restrictions Precautions Precautions: Fall Precaution Comments: watch BP, HR Restrictions Weight Bearing Restrictions: No       Mobility Bed Mobility Overal bed mobility: Needs Assistance Bed Mobility: Supine to Sit     Supine to sit: Supervision     General bed mobility comments: HOB slightly elevated. pt used bed rail and whole body momentum to come to EOB. Discussed obtaining a bed rail for home use.    Transfers Overall transfer level: Needs assistance Equipment used: Rolling walker (2 wheels) Transfers: Sit to/from Stand Sit to Stand: Min guard, Min assist           General transfer comment: cues for hand  placement. min guard from elevated seat height, min A from regular toilet seat height     Balance Overall balance assessment: Needs assistance Sitting-balance support: No upper extremity supported, Feet supported Sitting balance-Leahy Scale: Good     Standing balance support: Bilateral upper extremity supported Standing balance-Leahy Scale: Poor                             ADL either performed or assessed with clinical judgement   ADL Overall ADL's : Needs assistance/impaired                     Lower Body Dressing: Min guard;Minimal assistance;Sitting/lateral leans;Sit to/from stand   Toilet Transfer: Minimal assistance;Regular Toilet;Rolling walker (2 wheels);Grab bars Toilet Transfer Details (indicate cue type and reason): pt reports toilet at home sits higher. has brag bar by toilet at home           General ADL Comments: min A to control descent and powerup. to/from regular height toilet, EOB, and recliner. cues for safe approach and positioning prior to initiating sitting down.    Extremity/Trunk Assessment Upper Extremity Assessment Upper Extremity Assessment: Generalized weakness   Lower Extremity Assessment Lower Extremity Assessment: Defer to PT evaluation        Vision   Additional Comments: reading glasses   Perception     Praxis      Cognition Arousal/Alertness: Awake/alert Behavior During Therapy: WFL for tasks assessed/performed Overall Cognitive Status: Within Functional Limits for tasks assessed  Exercises      Shoulder Instructions       General Comments SpO2 varing widely during OOB activity but unreliable pleth reading during session. suspect d/t sensor getting wet during oral care. Pt reporting no lightheadedness or SOB during OOB activity    Pertinent Vitals/ Pain       Pain Assessment Pain Assessment: No/denies pain  Home Living                                           Prior Functioning/Environment              Frequency  Min 2X/week        Progress Toward Goals  OT Goals(current goals can now be found in the care plan section)  Progress towards OT goals: Progressing toward goals  Acute Rehab OT Goals OT Goal Formulation: With patient Time For Goal Achievement: 02/18/22 Potential to Achieve Goals: Good ADL Goals Pt Will Perform Grooming: with supervision;standing Pt Will Perform Lower Body Bathing: with supervision;sit to/from stand Pt Will Perform Lower Body Dressing: with supervision;sit to/from stand Pt Will Transfer to Toilet: with supervision;ambulating;regular height toilet Pt Will Perform Toileting - Clothing Manipulation and hygiene: with supervision;sit to/from stand Additional ADL Goal #1: Pt will perform bed mobility modified independently in preparation for ADLs.  Plan Discharge plan remains appropriate    Co-evaluation                 AM-PAC OT "6 Clicks" Daily Activity     Outcome Measure   Help from another person eating meals?: None Help from another person taking care of personal grooming?: A Little Help from another person toileting, which includes using toliet, bedpan, or urinal?: A Little Help from another person bathing (including washing, rinsing, drying)?: A Little Help from another person to put on and taking off regular upper body clothing?: None Help from another person to put on and taking off regular lower body clothing?: A Little 6 Click Score: 20    End of Session Equipment Utilized During Treatment: Rolling walker (2 wheels)  OT Visit Diagnosis: Unsteadiness on feet (R26.81);Other abnormalities of gait and mobility (R26.89);Muscle weakness (generalized) (M62.81)   Activity Tolerance Patient tolerated treatment well   Patient Left in chair;with call bell/phone within reach;with chair alarm set   Nurse Communication Mobility status (NT)        Time:  1779-3903 OT Time Calculation (min): 26 min  Charges: OT General Charges $OT Visit: 1 Visit OT Treatments $Self Care/Home Management : 23-37 mins  Tyrone Schimke, OT Acute Rehabilitation Services Office: [phone]   Hortencia Pilar 02/06/2022, 10:26 AM

## 2022-02-06 NOTE — Progress Notes (Addendum)
Rounding Note    Patient Name: Ellen Chandler Date of Encounter: 02/06/2022  [place] Cardiologist: Mertie Moores, MD   Subjective   Has been ambulating some  No chest pain  Slight bump in BUN/Scr Volume is up slightly today according to I/O Creatinine is 1.35 ( up slightly )   She is feeling well O2 sats are 95-97 on room air ( at rest)    Inpatient Medications    Scheduled Meds:  amiodarone  200 mg Oral Daily   apixaban  5 mg Oral BID   lactose free nutrition  237 mL Oral QHS   levothyroxine  88 mcg Oral QAC breakfast   metoprolol tartrate  25 mg Oral BID   mirabegron ER  50 mg Oral Daily   multivitamin with minerals  1 tablet Oral Daily   pantoprazole  40 mg Oral Daily   Continuous Infusions:  PRN Meds: ondansetron **OR** ondansetron (ZOFRAN) IV, mouth rinse   Vital Signs    Vitals:   02/06/22 0001 02/06/22 0532 02/06/22 0732 02/06/22 0907  BP: (!) 96/53 103/64 (!) 106/46 (!) 105/51  Pulse: 60 (!) 56 (!) 54 62  Resp: '18 16 17 18  '$ Temp: 98 F (36.7 C) 98.4 F (36.9 C) 98.4 F (36.9 C)   TempSrc: Oral Oral Oral   SpO2: 94% 95% 96% 94%  Weight:  63 kg    Height:        Intake/Output Summary (Last 24 hours) at 02/06/2022 1030 Last data filed at 02/06/2022 7902 Gross per 24 hour  Intake 240 ml  Output 350 ml  Net -110 ml      02/06/2022    5:32 AM 02/05/2022    4:26 AM 02/04/2022    3:16 AM  Last 3 Weights  Weight (lbs) 138 lb 14.2 oz 137 lb 12.6 oz 141 lb 8.6 oz  Weight (kg) 63 kg 62.5 kg 64.2 kg      Telemetry    NSR - Personally Reviewed  ECG    N/A  Physical Exam   GEN: No acute distress.   Neck: No JVD Cardiac: RRR, no murmurs, rubs, or gallops.  Respiratory: few rales ? Atelectasis  GI: Soft, nontender, non-distended  MS: No edema; No deformity. Neuro:  Nonfocal  Psych: Normal affect   Labs    High Sensitivity Troponin:   Recent Labs  Lab 02/03/22 0315 02/03/22 0742 02/03/22 1015 02/03/22 1453   TROPONINIHS 13 173* 279* 238*     Chemistry Recent Labs  Lab 02/03/22 0315 02/04/22 0317 02/05/22 1111 02/06/22 0054  NA 136 138 134* 134*  K 4.7 3.4* 4.7 4.4  CL 103 95* 98 93*  CO2 '24 30 26 30  '$ GLUCOSE 146* 100* 64* 105*  BUN 19 20 30* 35*  CREATININE 0.90 0.88 1.19* 1.35*  CALCIUM 9.1 9.8 9.3 9.5  PROT 6.4*  --   --   --   ALBUMIN 3.5  --   --   --   AST 30  --   --   --   ALT 14  --   --   --   ALKPHOS 28*  --   --   --   BILITOT 0.9  --   --   --   GFRNONAA >60 >60 44* 38*  ANIONGAP '9 13 10 11    '$ Lipids No results for input(s): "CHOL", "TRIG", "HDL", "LABVLDL", "LDLCALC", "CHOLHDL" in the last 168 hours.  Hematology Recent Labs  Lab 02/03/22  0315  WBC 9.6  RBC 3.91  HGB 13.2  HCT 38.9  MCV 99.5  MCH 33.8  MCHC 33.9  RDW 12.0  PLT 308   Thyroid  Recent Labs  Lab 02/04/22 0350  TSH 4.788*    BNP Recent Labs  Lab 02/03/22 0315  BNP 884.1*    DDimer No results for input(s): "DDIMER" in the last 168 hours.   Radiology    No results found.  Cardiac Studies   RIGHT/LEFT HEART CATH AND CORONARY ANGIOGRAPHY  01/09/2022    Conclusion   1.  Normal right dominant coronary circulation with no obstructive coronary artery disease. 2.  Mean RA pressure of 1 mmHg, mean wedge pressure of 3 mmHg, cardiac output of 8.5 L/min and cardiac index of 5.1 L/min/m. 3.  LVEDP of 16 mmHg. 4.  Very tortuous right upper extremity that could not be navigated necessitating a femoral approach.   Recommendation: Goal-directed medical therapy.   Echo 01/04/22 1. Left ventricular ejection fraction, by estimation, is 40 to 45%. The  left ventricle has mildly decreased function. The left ventricle has no  regional wall motion abnormalities. Left ventricular diastolic parameters  are consistent with Grade I  diastolic dysfunction (impaired relaxation). Elevated left ventricular  end-diastolic pressure.   2. Right ventricular systolic function is normal. The right  ventricular  size is normal. There is normal pulmonary artery systolic pressure.   3. Left atrial size was moderately dilated.   4. The mitral valve is normal in structure. Trivial mitral valve  regurgitation. No evidence of mitral stenosis.   5. The aortic valve is tricuspid. There is mild calcification of the  aortic valve. There is mild thickening of the aortic valve. Aortic valve  regurgitation is trivial. No aortic stenosis is present.   6. The inferior vena cava is normal in size with greater than 50%  respiratory variability, suggesting right atrial pressure of 3 mmHg.    Patient Profile     88 y.o. female with a hx of HTN, paroxysmal Afib, chronic combined systolic and diastolic HF with mildly reduced EF,GERD and hypothyroidism who is being seen for the evaluation of Afib with RVR at the request of Dr. Lonny Prude.    Assessment & Plan    #Acute on chronic combined systolic and diastolic HF with mildly reduced EF: # Acute hypoxic respiratory failure Patient with recently diagnosed systolic HF on admission 52/84-13/24. TTE at that time with LVEF 40-45% with septal and apical hypokinesis. Cath with mild disease. She returns on this admission with progressive dyspnea in the setting of dietary indiscretion found to have BNP 800s and pulmonary edema on CXR consistent with acute on chronic combined HF exacerbation. Required BiPAP initially but has since been weaned off. Responding well to IV diuresis.  - Net I & O -2.1L since admit. Only -50cc in past 24 hours - Weight down 140>>>1378b.  - Continue metoprolol tartrate '25mg'$  BID  - Limited GDMT due to blood pressure - Lasix on  hold today due to bump in BUN/SCr from 30/1.19 to 35/1.35 - Likely she will need PRN (PTA) or low dose lasix at home. MD to review    #Paroxysmal Aflutter with RVR: CHADs-vasc 5. Having episodes of RVR in the setting of acute volume overload as above. Dilt stopped due to hypotension. Had previously responded well to  amiodarone and is amenable to re-trialing today to help with rate control given soft blood pressures in the 80s.  -Treated with amiodarone bolus +gtt >> converted  to sinus rhythm overnight>>off amiodarone.  - Continue Amiodarone '200mg'$  qd   TSH minimally up and on long term thyroid supplement.  -Continue apixaban '5mg'$  BID     #Chest Pain: #Elevated troponin: Demand in the setting of acute volume overload as above. Cath 09/23 without obstructive disease.   Likely DC later today.    For questions or updates, please contact [place] Please consult [email or web address] for contact info under        Signed, Leanor Kail, PA  02/06/2022, 10:30 AM     Attending Note:   The patient was seen and examined.  Agree with assessment and plan as noted above.  Changes made to the above note as needed.  Patient seen and independently examined with Robbie Lis, PA .   We discussed all aspects of the encounter. I agree with the assessment and plan as stated above.    Atrial fib:  is maintainine sinus rhythm on PO Amio Continue eliquis 5 BID   2.  Chest pain:  non obstructive CAD   3. Acute on chronic combined CHF.   Advised her to avoid salty foods She will need a prescription for Lasix 20 mg to take as needed   She may be able to be discharged tomorrow am    I have spent a total of 40 minutes with patient reviewing hospital  notes , telemetry, EKGs, labs and examining patient as well as establishing an assessment and plan that was discussed with the patient.  > 50% of time was spent in direct patient care.    Thayer Headings, Brooke Bonito., MD, [facility] 02/06/2022, 3:09 PM [address],  [place] Pager [phone]

## 2022-02-06 NOTE — Assessment & Plan Note (Addendum)
Hyponatremia hypokalemia.   Renal function with serum cr at 0.90 with K at 3,7 and serum bicarbonate 27. Na 135  Plan to continue furosemide at home 20 mg as needed for signs of volume overload. Follow up renal function as outpatient.

## 2022-02-06 NOTE — Progress Notes (Signed)
Heart Failure Nurse Navigator Progress Note  PCP: Deland Pretty, MD PCP-Cardiologist: [place] Admission Diagnosis: Acute pulmonary edema Admitted from: Assisted Living Via EMS  Presentation:   Ellen Chandler presented with chest pain and shortness of breath, placed on CPAP by EMS then BiPAP via ED. BP 117/73, HR 63, BNP 884, EKG Sinus rhythm with ventricular premature complexes. IV lasix given,   Patient was educated on the sign and symptoms of heart failure, daily weights, when to call her doctor or go to the ED, Diet/ fluid restrictions, patient stated she doesn't always follow a low sodium diet, She does take all her medications as prescribed and attends her medical appointments. Patient is scheduled for a hospital HF TOC appointment on 02/14/2022 @ 10 am.   ECHO/ LVEF: 40-45% HFrEF G1DD  Clinical Course:  Past Medical History:  Diagnosis Date   Abnormal ECG    a. Pt aware of long hx of abnl ecg->h/o normal stress testing in [place] ~ 2000.   Diastolic dysfunction    a. 04/2013 Echo: EF 60-65%, No rwma, Gr 1 DD, mild MR.   GERD (gastroesophageal reflux disease)    Hypertension    Hypothyroidism      Social History   Socioeconomic History   Marital status: Widowed    Spouse name: Not on file   Number of children: 4   Years of education: Not on file   Highest education level: High school graduate  Occupational History   Occupation: Retired  Tobacco Use   Smoking status: Never   Smokeless tobacco: Never  Vaping Use   Vaping Use: Never used  Substance and Sexual Activity   Alcohol use: Never   Drug use: No   Sexual activity: Not on file  Other Topics Concern   Not on file  Social History Narrative   Moved to [place] from [place] in June 2014.  Lives at [facility].  Was exercising daily.   Social Determinants of Health   Financial Resource Strain: Low Risk  (02/06/2022)   Overall Financial Resource Strain (CARDIA)    Difficulty of  Paying Living Expenses: Not very hard  Food Insecurity: No Food Insecurity (02/06/2022)   Hunger Vital Sign    Worried About Running Out of Food in the Last Year: Never true    Ran Out of Food in the Last Year: Never true  Transportation Needs: No Transportation Needs (02/06/2022)   PRAPARE - Hydrologist (Medical): No    Lack of Transportation (Non-Medical): No  Physical Activity: Not on file  Stress: Not on file  Social Connections: Not on file   Education Assessment and Provision:  Detailed education and instructions provided on heart failure disease management including the following:  Signs and symptoms of Heart Failure When to call the physician Importance of daily weights Low sodium diet Fluid restriction Medication management Anticipated future follow-up appointments  Patient education given on each of the above topics.  Patient acknowledges understanding via teach back method and acceptance of all instructions.  Education Materials:  "Living Better With Heart Failure" Booklet, HF zone tool, & Daily Weight Tracker Tool.  Patient has scale at home: yes Patient has pill box at home: yes    High Risk Criteria for Readmission and/or Poor Patient Outcomes: Heart failure hospital admissions (last 6 months): 0  No Show rate: 0 Difficult social situation: No Demonstrates medication adherence: Yes Primary Language: English Literacy level: Reading, writing, and comprehension  Barriers  of Care:   Diet/ fluid restrictions Daily weights  Considerations/Referrals:   Referral made to Heart Failure Pharmacist Stewardship: yes Referral made to Heart Failure CSW/NCM TOC: No Referral made to Heart & Vascular TOC clinic: Yes, 02/13/2022 @ 12 noon  Items for Follow-up on DC/TOC: Diet restrictions Daily weights   Earnestine Leys, BSN, RN Heart Failure Transport planner Only

## 2022-02-06 NOTE — Progress Notes (Signed)
Heart Failure Stewardship Pharmacist Progress Note   PCP: Deland Pretty, MD PCP-Cardiologist: Mertie Moores, MD    HPI:  88 yo F with PMH of CHF, GERD, HTN, hypothyroidism, and afib.  Admitted from 8/30-9/8 with NSTEMI. ECHO from 8/31 showed LVEF 40-45%, G1DD, RV normal, trivial MR. R/LHC on 9/5 with non-obstructive mild disease, RA 1, PA 7, wedge 3, CO 8.5, CI 5.   She presented to the ED on 9/30 with shortness of breath. CXR with mild pulmonary edema. BNP elevated. Likely due to dietary indiscretion. Found to be in afib RVR, initially treated with diltiazem and converted to amiodarone with hypotension.  Current HF Medications: Beta Blocker: metoprolol tartrate 25 mg BID  Prior to admission HF Medications: Beta blocker: metoprolol tartrate 25 mg BID *not taking furosemide  Pertinent Lab Values: Serum creatinine 1.35, BUN 35, Potassium 4.4, Sodium 134, BNP 884.1  Vital Signs: Weight: 138 lbs (admission weight: 139 lbs) Blood pressure: 100/50s  Heart rate: 50-60s  I/O: -1L yesterday; net -2.8L  Medication Assistance / Insurance Benefits Check: Does the patient have prescription insurance?  Yes Type of insurance plan: [employer]  Does the patient qualify for medication assistance through manufacturers or grants?   Pending Eligible grants and/or patient assistance programs: pending Medication assistance applications in progress: none  Medication assistance applications approved: none Approved medication assistance renewals will be completed by: pending  Outpatient Pharmacy:  Prior to admission outpatient pharmacy: [facility] Is the patient willing to use [facility] at discharge? Yes Is the patient willing to transition their outpatient pharmacy to utilize a [facility] outpatient pharmacy?   Pending    Assessment: 1. Acute on chronic systolic and diastolic CHF (LVEF 20-25%), due to NICM. NYHA class II symptoms. - Lasix on hold today with bump in SCr - Continue  metoprolol tartrate 25 mg BID - GDMT with ARB/MRA limited by hypotension - Consider adding SGLT2i prior to discharge if SCr improves   Plan: 1) Medication changes recommended at this time: - Continue current regimen  2) Patient assistance: - Jardiance copay $45 - Farxiga copay $45  3)  Education  - To be completed prior to discharge  Kerby Nora, PharmD, BCPS Heart Failure Stewardship Pharmacist Phone [phone]

## 2022-02-07 ENCOUNTER — Other Ambulatory Visit (HOSPITAL_COMMUNITY): Payer: Self-pay

## 2022-02-07 DIAGNOSIS — N179 Acute kidney failure, unspecified: Secondary | ICD-10-CM | POA: Diagnosis not present

## 2022-02-07 DIAGNOSIS — I4892 Unspecified atrial flutter: Secondary | ICD-10-CM | POA: Diagnosis not present

## 2022-02-07 DIAGNOSIS — I5043 Acute on chronic combined systolic (congestive) and diastolic (congestive) heart failure: Secondary | ICD-10-CM | POA: Diagnosis not present

## 2022-02-07 DIAGNOSIS — K219 Gastro-esophageal reflux disease without esophagitis: Secondary | ICD-10-CM | POA: Diagnosis not present

## 2022-02-07 LAB — BASIC METABOLIC PANEL
Anion gap: 11 (ref 5–15)
BUN: 33 mg/dL — ABNORMAL HIGH (ref 8–23)
CO2: 27 mmol/L (ref 22–32)
Calcium: 9.3 mg/dL (ref 8.9–10.3)
Chloride: 97 mmol/L — ABNORMAL LOW (ref 98–111)
Creatinine, Ser: 0.9 mg/dL (ref 0.44–1.00)
GFR, Estimated: >60 mL/min (ref >60)
Glucose, Bld: 112 mg/dL — ABNORMAL HIGH (ref 70–99)
Potassium: 3.7 mmol/L (ref 3.5–5.1)
Sodium: 135 mmol/L (ref 135–145)

## 2022-02-07 MED ORDER — AMIODARONE HCL 200 MG PO TABS
200.0000 mg | ORAL_TABLET | Freq: Every day | ORAL | 0 refills | Status: DC
  Filled 2022-02-07: qty 30, 30d supply, fill #0

## 2022-02-07 MED ORDER — FUROSEMIDE 20 MG PO TABS: 20.0000 mg | ORAL_TABLET | Freq: Every day | ORAL | Status: DC | PRN

## 2022-02-07 MED ORDER — FUROSEMIDE 20 MG PO TABS
20.0000 mg | ORAL_TABLET | Freq: Every day | ORAL | 0 refills | Status: DC | PRN
  Filled 2022-02-07: qty 30, 30d supply, fill #0

## 2022-02-07 MED ORDER — EMPAGLIFLOZIN 10 MG PO TABS
10.0000 mg | ORAL_TABLET | Freq: Every day | ORAL | 0 refills | Status: DC
  Filled 2022-02-07: qty 30, 30d supply, fill #0

## 2022-02-07 MED ORDER — EMPAGLIFLOZIN 10 MG PO TABS
10.0000 mg | ORAL_TABLET | Freq: Every day | ORAL | Status: DC
  Administered 2022-02-07: 10 mg via ORAL
  Filled 2022-02-07: qty 1

## 2022-02-07 NOTE — Progress Notes (Signed)
   02/07/22 0949  Mobility  Activity Ambulated independently in room  Activity Response Tolerated well  Distance Ambulated (ft) 10 ft  $Mobility charge 1 Mobility  Level of Assistance Standby assist, set-up cues, supervision of patient - no hands on  Assistive Device Front wheel walker  Mobility Referral Yes   Mobility Specialist Progress Note  Pt was in bed and agreeable. Returned to chair w/ all needs met and call bell in reach.  Lucious Groves Mobility Specialist

## 2022-02-07 NOTE — TOC Transition Note (Signed)
Transition of Care [facility]) - CM/SW Discharge Note   Patient Details  Name: Ellen Chandler MRN: [id number] Date of Birth: [birth year]  Transition of Care [facility] CM/SW Contact:  Zenon Mayo, RN Phone Number: 02/07/2022, 10:32 AM   Clinical Narrative:    Patient is for dc today back to her IDL at [facility], she states she has had enough ot physical therapy she does not want any therapy at all. She states she has an aide that comes once a month to clean her shower and vacuums for her.  Patient states she cooks her own food.  Her DIL will be transporting her home today. She has no needs.    Final next level of care: Home/Self Care Barriers to Discharge: No Barriers Identified   Patient Goals and CMS Choice Patient states their goals for this hospitalization and ongoing recovery are:: return home CMS Medicare.gov Compare Post Acute Care list provided to:: Patient Choice offered to / list presented to : Patient  Discharge Placement                       Discharge Plan and Services In-house Referral: Clinical Social Work                DME Agency: NA       HH Arranged: Refused HH, PT, OT          Social Determinants of Health (SDOH) Interventions Food Insecurity Interventions: Intervention Not Indicated Housing Interventions: Intervention Not Indicated Transportation Interventions: Intervention Not Indicated Utilities Interventions: Intervention Not Indicated Alcohol Usage Interventions: Intervention Not Indicated (Score <7) Financial Strain Interventions: Intervention Not Indicated   Readmission Risk Interventions     No data to display

## 2022-02-07 NOTE — Discharge Summary (Addendum)
Physician Discharge Summary   Patient: Ellen Chandler MRN: [id number] DOB: Aug 14, 1930  Admit date:     02/03/2022  Discharge date: 02/07/22  Discharge Physician: Jimmy Picket Oscar Forman   PCP: Deland Pretty, MD   Recommendations at discharge:    Patient will continue taking amiodarone for atrial fibrillation rhythm and rate control. Continue anticoagulation with apixaban Added SGLT 2 inh for heart failure No RAS inhibition due to risk of hypotension and because recovering acute kidney injury, to consider start as outpatient Furosemide as needed for wight gain 2 to 3 lbs in 24 hrs or 5 lbs in 7 days.   I spoke over the phone with the patient's son about patient's  condition, plan of care, prognosis and all questions were addressed.   Discharge Diagnoses: Principal Problem:   Acute on chronic combined systolic and diastolic CHF (congestive heart failure) (HCC) Active Problems:   Atrial flutter (HCC)   Essential hypertension   AKI (acute kidney injury) (Ellen Chandler)   GERD (gastroesophageal reflux disease)   Hypothyroidism  Resolved Problems:   * No resolved hospital problems. *  Hospital Course: [name] was admitted to the hospital with the working diagnosis of acute heart failure decompensation.    88 y.o. female with a history of  heart failure, GERD, hypertension, hypothyroidism. Patient presented secondary to shortness of breath and found to have evidence of acute heart failure and atrial fibrillation with RVR.  Reported abrupt onset of dyspnea, for about 12 hrs, worse with movement, and associated with substernal chest pain. Her blood pressure was 126/67 HR 72, RR 21 and 02 saturation 98%, lungs with rales bilaterally, heart with S1 and S2 present and rhythmic, abdomen not distended and no lower extremity edema.   Na 130, K 4,7 CL 103 bicarbonate 24, glucose 146 bun 19 cr 0,90 High sensitive troponin 13. 173, 279, 238  Wbc 9,6 hgb 13.2 plt 308  Sars covid 19  negative  Chest radiograph with bilateral hilar vascular congestion and cephalization of the vasculature.   EKG 86 bpm, normal axis, normal intervals, sinus rhythm with positive PVCs, no significant ST segment or T wave changes.   Prior to admission, patient reverted to sinus rhythm. Patient managed on BiPAP and give Lasix IV for her heart failure with improvement of symptoms. Supplemental oxygen weaned off. During hospitalization, patient had recurrent RVR with associated atrial fibrillation and flutter. Cardiology consulted.  Patient was placed on amiodarone for rate control atrial fibrillation, because use of diltiazem resulted in hypotension.  Patient converted from atrial fibrillation back into sinus rhythm.   Hospitalization prolonged due to AKI due to over diuresis.   Assessment and Plan: * Acute on chronic combined systolic and diastolic CHF (congestive heart failure) (HCC) Echocardiogram with mild reduction in LV systolic function 40 to 35%, RV systolic function preserved, moderate dilatation of LA. Left ventricle wall with entire septum, apical anterior segment, apical inferior segment, and apex are hypokinetic. No significant valvular disease.   Elevation in troponin due to demand ischemia, with ruled out NSTEMI.   Patient was placed on furosemide for diuresis, negative fluid balance was achieved, -2.976 ml, with improvement in her symptoms.  Patient loss about 1 kg weight.   Acute hypoxemic respiratory failure, due to acute cardiogenic pulmonary edema. Clinically improved, at the time of her discharge her 02 saturation is 90% on room air.   Patient will continue heart failure management with metoprolol and rate control atrial fibrillation with amiodarone.  Continue diuretic therapy with furosemide  as needed.  Add SGLT 2 inh and follow up as outpatient.       Atrial flutter (HCC) Atrial fibrillation, paroxysmal. Initially patient placed on diltiazem, but because  hypotension she was transitioned to amiodarone with good toleration. At the time of her discharge patient is on sinus rhythm.  Continue anticoagulation with apixaban.   Essential hypertension Continue blood pressure control with metoprolol.   AKI (acute kidney injury) (Ellen Chandler) Hyponatremia hypokalemia.   Renal function with serum cr at 0.90 with K at 3,7 and serum bicarbonate 27. Na 135  Plan to continue furosemide at home 20 mg as needed for signs of volume overload. Follow up renal function as outpatient.   GERD (gastroesophageal reflux disease) Patient is on Protonix 40 mg BID -Continue Protonix 40 mg daily  Hypothyroidism Patient is on Synthroid 88 mcg daily as an outpatient. -Continue Synthroid 88 mcg daily         Consultants: cardiology  Procedures performed: none  Disposition: Home Diet recommendation:  Cardiac diet DISCHARGE MEDICATION: Allergies as of 02/07/2022   No Known Allergies      Medication List     TAKE these medications    acetaminophen 500 MG tablet Commonly known as: TYLENOL Take 1,000 mg by mouth every 6 (six) hours as needed for moderate pain.   amiodarone 200 MG tablet Commonly known as: PACERONE Take 1 tablet (200 mg total) by mouth daily.   Eliquis 5 MG Tabs tablet Generic drug: apixaban Take 1 tablet (5 mg total) by mouth 2 (two) times daily.   empagliflozin 10 MG Tabs tablet Commonly known as: JARDIANCE Take 1 tablet (10 mg total) by mouth daily.   furosemide 20 MG tablet Commonly known as: LASIX Take 1 tablet (20 mg total) by mouth daily as needed for edema or fluid (as needed for weight increase 2 to 3 lbs in 24 hrs or 5 lbs in 7 days.). What changed:  how much to take how to take this when to take this reasons to take this additional instructions   lactose free nutrition Liqd Take 237 mLs by mouth at bedtime.   levothyroxine 88 MCG tablet Commonly known as: SYNTHROID Take 88 mcg by mouth daily before breakfast.    metoprolol tartrate 25 MG tablet Commonly known as: LOPRESSOR Take 25 mg by mouth 2 (two) times daily.   multivitamin with minerals Tabs tablet Take 1 tablet by mouth daily.   Myrbetriq 50 MG Tb24 tablet Generic drug: mirabegron ER Take 50 mg by mouth daily.   pantoprazole 40 MG tablet Commonly known as: PROTONIX Take 40 mg by mouth 2 (two) times daily.        Follow-up Information     Deland Pretty, MD. Go on 02/13/2022.   Specialty: Internal Medicine Why: '@9'$ :30am Contact information: [address] [place] [address] [phone]         Nahser, Wonda Cheng, MD .   Specialty: Cardiology Contact information: [place] [address] [phone]          [facility]. Go in 8 day(s).   Specialty: Cardiology Why: Hospital follow up PLEASE bring a current medication list to appointment FREE valet parking, Entrance C, off [employer] information: [address] [id number] [place] [phone]               Discharge Exam: Filed Weights   02/05/22 0426 02/06/22 0532 02/07/22 0405  Weight:  62.5 kg 63 kg 62.5 kg   BP 113/79 (BP Location: Right Arm)   Pulse 64   Temp 98.2 F (36.8 C) (Oral)   Resp 20   Ht 5' 4.02" (1.626 m)   Wt 62.5 kg   SpO2 93%   BMI 23.64 kg/m   Patient with no chest pain or dyspnea  Neurology awake and alert ENT with mild pallor Cardiovascular with S1 and S2 present and rhythmic with no gallops, rubs or murmurs Respiratory with no rales or wheezing Abdomen with no distention  No lower extremity edema.   Condition at discharge: stable  The results of significant diagnostics from this hospitalization (including imaging, microbiology, ancillary and laboratory) are listed below for reference.   Imaging Studies: DG Chest Portable 1 View  Result Date: 02/03/2022 CLINICAL DATA:   Shortness of breath EXAM: PORTABLE CHEST 1 VIEW COMPARISON:  01/10/2022 FINDINGS: Cardiac shadow is prominent but stable. Large hiatal hernia is again seen. Aortic calcifications are noted. Mild central vascular congestion with interstitial edema is noted. No focal confluent infiltrate is seen. IMPRESSION: Mild CHF with interstitial edema. Large hiatal hernia. Electronically Signed   By: Inez Catalina M.D.   On: 02/03/2022 03:35   DG CHEST PORT 1 VIEW  Result Date: 01/10/2022 CLINICAL DATA:  Short of breath and chest pain today. EXAM: PORTABLE CHEST 1 VIEW COMPARISON:  01/04/2022 and older studies. FINDINGS: Large hiatal hernia. Cardiac silhouette is normal in size. No mediastinal or hilar masses. Mild reticular type opacities in the lung bases consistent with atelectasis. Lungs otherwise clear. Possible small effusions. No pneumothorax. Skeletal structures are grossly intact. IMPRESSION: 1. No acute cardiopulmonary disease. 2. Large hiatal hernia. Electronically Signed   By: Lajean Manes M.D.   On: 01/10/2022 13:00   CARDIAC CATHETERIZATION  Result Date: 01/09/2022 1.  Normal right dominant coronary circulation with no obstructive coronary artery disease. 2.  Mean RA pressure of 1 mmHg, mean wedge pressure of 3 mmHg, cardiac output of 8.5 L/min and cardiac index of 5.1 L/min/m. 3.  LVEDP of 16 mmHg. 4.  Very tortuous right upper extremity that could not be navigated necessitating a femoral approach. Recommendation: Goal-directed medical therapy.    Microbiology: Results for orders placed or performed during the hospital encounter of 02/03/22  SARS Coronavirus 2 by RT PCR (hospital order, performed in [facility] hospital lab) *cepheid single result test* Anterior Nasal Swab     Status: None   Collection Time: 02/03/22  3:20 AM   Specimen: Anterior Nasal Swab  Result Value Ref Range Status   SARS Coronavirus 2 by RT PCR NEGATIVE NEGATIVE Final    Comment: (NOTE) SARS-CoV-2 target nucleic acids are  NOT DETECTED.  The SARS-CoV-2 RNA is generally detectable in upper and lower respiratory specimens during the acute phase of infection. The lowest concentration of SARS-CoV-2 viral copies this assay can detect is 250 copies / mL. A negative result does not preclude SARS-CoV-2 infection and should not be used as the sole basis for treatment or other patient management decisions.  A negative result may occur with improper specimen collection / handling, submission of specimen other than nasopharyngeal swab, presence of viral mutation(s) within the areas targeted by this assay, and inadequate number of viral copies (<250 copies / mL). A negative result must be combined with clinical observations, patient history, and epidemiological information.  Fact Sheet for Patients:   https://www.patel.info/  Fact Sheet for Healthcare Providers: https://hall.com/  This test is not yet approved or  cleared  by the [place] and has been authorized for detection and/or diagnosis of SARS-CoV-2 by FDA under an Emergency Use Authorization (EUA).  This EUA will remain in effect (meaning this test can be used) for the duration of the COVID-19 declaration under Section 564(b)(1) of the Act, 21 U.S.C. section 360bbb-3(b)(1), unless the authorization is terminated or revoked sooner.  Performed at [facility], [place] [address] [place], [place] [address]     Labs: CBC: Recent Labs  Lab 02/03/22 0315  WBC 9.6  NEUTROABS 4.6  HGB 13.2  HCT 38.9  MCV 99.5  PLT 735   Basic Metabolic Panel: Recent Labs  Lab 02/03/22 0315 02/04/22 0317 02/05/22 1111 02/06/22 0054 02/07/22 0100  NA 136 138 134* 134* 135  K 4.7 3.4* 4.7 4.4 3.7  CL 103 95* 98 93* 97*  CO2 '24 30 26 30 27  '$ GLUCOSE 146* 100* 64* 105* 112*  BUN 19 20 30* 35* 33*  CREATININE 0.90 0.88 1.19* 1.35* 0.90  CALCIUM 9.1 9.8 9.3 9.5 9.3   Liver Function Tests: Recent Labs  Lab  02/03/22 0315  AST 30  ALT 14  ALKPHOS 28*  BILITOT 0.9  PROT 6.4*  ALBUMIN 3.5   CBG: No results for input(s): "GLUCAP" in the last 168 hours.  Discharge time spent: greater than 30 minutes.  Signed: Tawni Millers, MD [facility] 02/07/2022

## 2022-02-07 NOTE — Progress Notes (Signed)
Heart Failure Stewardship Pharmacist Progress Note   PCP: Deland Pretty, MD PCP-Cardiologist: Mertie Moores, MD    HPI:  88 yo F with PMH of CHF, GERD, HTN, hypothyroidism, and afib.  Admitted from 8/30-9/8 with NSTEMI. ECHO from 8/31 showed LVEF 40-45%, G1DD, RV normal, trivial MR. R/LHC on 9/5 with non-obstructive mild disease, RA 1, PA 7, wedge 3, CO 8.5, CI 5.   She presented to the ED on 9/30 with shortness of breath. CXR with mild pulmonary edema. BNP elevated. Likely due to dietary indiscretion. Found to be in afib RVR, initially treated with diltiazem and converted to amiodarone with hypotension.  Discharge HF Medications: Diuretic: furosemide 20 mg daily PRN Beta Blocker: metoprolol tartrate 25 mg BID SGLT2i: Jardiance 10 mg daily  Prior to admission HF Medications: Beta blocker: metoprolol tartrate 25 mg BID *not taking furosemide  Pertinent Lab Values: Serum creatinine 0.90, BUN 33, Potassium 3.7, Sodium 135, BNP 884.1  Vital Signs: Weight: 137 lbs (admission weight: 139 lbs) Blood pressure: 110-130/80s  Heart rate: 50-60s  I/O: -653m yesterday; net -3.2L  Medication Assistance / Insurance Benefits Check: Does the patient have prescription insurance?  Yes Type of insurance plan: [employer]  Does the patient qualify for medication assistance through manufacturers or grants?   Pending Eligible grants and/or patient assistance programs: pending Medication assistance applications in progress: none  Medication assistance applications approved: none Approved medication assistance renewals will be completed by: pending  Outpatient Pharmacy:  [facility] the [facility] [place] [place]? Yes Is the patient willing [facility]   Pending    Assessment: 1. Acute on chronic systolic and diastolic CHF (LVEF 485-27%, due to NICM.  NYHA class II symptoms. - Agree with resuming furosemide 20 mg daily PRN - Continue metoprolol tartrate 25 mg BID - GDMT with ARB/MRA limited by hypotension - Agree with adding Jardiance 10 mg daily   Plan: 1) Medication changes recommended at this time: - Discharge today  2) Patient assistance: - Jardiance copay $45 - Farxiga copay $45  3)  Education  - Patient has been educated on current HF medications and potential additions to HF medication regimen - Patient verbalizes understanding that over the next few months, these medication doses may change and more medications may be added to optimize HF regimen - Patient has been educated on basic disease state pathophysiology and goals of therapy   MKerby Nora PharmD, BCPS Heart Failure Stewardship Pharmacist Phone ([phone]

## 2022-02-07 NOTE — Care Management Important Message (Signed)
Important Message  Patient Details  Name: Ellen Chandler MRN: [id number] Date of Birth: 05-13-1930   Medicare Important Message Given:  Yes     Shelda Altes 02/07/2022, 10:02 AM

## 2022-02-08 ENCOUNTER — Ambulatory Visit: Payer: PPO | Admitting: Physician Assistant

## 2022-02-08 NOTE — Progress Notes (Deleted)
Cardiology Office Note:    Date:  02/08/2022   ID:  Ellen Chandler, DOB [birth year], MRN [id number]  PCP:  Deland Pretty, MD  [facility] Cardiologist:  Mertie Moores, MD  [place] Electrophysiologist:  None   Chief Complaint: Hospital follow up   History of Present Illness:    Ellen Chandler is a 88 y.o. female with a hx of hypertension, HFpEF, GERD, hypothyroidism  seen for hospital follow up.   Reported she had had stress testing back in 2000 secondary to an abnormal EKG which was reportedly normal. She was previously seen back in 2014 by Dr. Meda Chandler while inpatient in the setting of acute respiratory failure which was felt to be multifactorial with respiratory infection and mild heart failure.  Echocardiogram during that admission showed LVEF of 60 to 65%, no regional wall motion abnormality, grade 1 diastolic dysfunction, mild MR. She has been lost to follow-up since that time.   Admitted  Past Medical History:  Diagnosis Date   Abnormal ECG    a. Pt aware of long hx of abnl ecg->h/o normal stress testing in [place] ~ 2000.   Diastolic dysfunction    a. 04/2013 Echo: EF 60-65%, No rwma, Gr 1 DD, mild MR.   GERD (gastroesophageal reflux disease)    Hypertension    Hypothyroidism     Past Surgical History:  Procedure Laterality Date   ABDOMINAL HYSTERECTOMY     JOINT REPLACEMENT     a. knee left - injury resulted after being attacked by a dog.   LEFT HEART CATH AND CORONARY ANGIOGRAPHY N/A 01/05/2022   Procedure: LEFT HEART CATH AND CORONARY ANGIOGRAPHY;  Surgeon: Belva Crome, MD;  Location: [facility];  Service: Cardiovascular;  Laterality: N/A;   RIGHT/LEFT HEART CATH AND CORONARY ANGIOGRAPHY N/A 01/09/2022   Procedure: RIGHT/LEFT HEART CATH AND CORONARY ANGIOGRAPHY;  Surgeon: Early Osmond, MD;  Location: [facility];  Service: Cardiovascular;  Laterality: N/A;    Current Medications: No outpatient medications have been marked as  taking for the 02/08/22 encounter (Appointment) with Leanor Kail, [place].     Allergies:   Patient has no known allergies.   Social History   Socioeconomic History   Marital status: Widowed    Spouse name: Not on file   Number of children: 4   Years of education: Not on file   Highest education level: High school graduate  Occupational History   Occupation: Retired  Tobacco Use   Smoking status: Never   Smokeless tobacco: Never  Vaping Use   Vaping Use: Never used  Substance and Sexual Activity   Alcohol use: Never   Drug use: No   Sexual activity: Not on file  Other Topics Concern   Not on file  Social History Narrative   Moved to [place] from [place] in June 2014.  Lives at [facility].  Was exercising daily.   Social Determinants of Health   Financial Resource Strain: Low Risk  (02/06/2022)   Overall Financial Resource Strain (CARDIA)    Difficulty of Paying Living Expenses: Not very hard  Food Insecurity: No Food Insecurity (02/06/2022)   Hunger Vital Sign    Worried About Running Out of Food in the Last Year: Never true    Ran Out of Food in the Last Year: Never true  Transportation Needs: No Transportation Needs (02/06/2022)   PRAPARE - Hydrologist (Medical): No    Lack  of Transportation (Non-Medical): No  Physical Activity: Not on file  Stress: Not on file  Social Connections: Not on file     Family History: The patient's family history includes Breast cancer in her sister; Diabetes type II in her father; Heart attack in her mother.  ***  ROS:   Please see the history of present illness.    All other systems reviewed and are negative. ***  EKGs/Labs/Other Studies Reviewed:    The following studies were reviewed today: ***  EKG:  EKG is *** ordered today.  The ekg ordered today demonstrates ***  Recent Labs: 01/12/2022: Magnesium 2.2 02/03/2022: ALT 14; B Natriuretic Peptide 884.1; Hemoglobin 13.2;  Platelets 308 02/04/2022: TSH 4.788 02/07/2022: BUN 33; Creatinine, Ser 0.90; Potassium 3.7; Sodium 135  Recent Lipid Panel    Component Value Date/Time   CHOL 121 01/05/2022 0434   TRIG 56 01/05/2022 0434   HDL 40 (L) 01/05/2022 0434   CHOLHDL 3.0 01/05/2022 0434   VLDL 11 01/05/2022 0434   LDLCALC 70 01/05/2022 0434     Risk Assessment/Calculations:   {Does this patient have ATRIAL FIBRILLATION?[phone]}   Physical Exam:    VS:  There were no vitals taken for this visit.    Wt Readings from Last 3 Encounters:  02/07/22 137 lb 12.6 oz (62.5 kg)  01/12/22 140 lb 3.4 oz (63.6 kg)  06/17/21 135 lb (61.2 kg)     GEN: *** Well nourished, well developed in no acute distress HEENT: Normal NECK: No JVD; No carotid bruits LYMPHATICS: No lymphadenopathy CARDIAC: ***RRR, no murmurs, rubs, gallops RESPIRATORY:  Clear to auscultation without rales, wheezing or rhonchi  ABDOMEN: Soft, non-tender, non-distended MUSCULOSKELETAL:  No edema; No deformity  SKIN: Warm and dry NEUROLOGIC:  Alert and oriented x 3 PSYCHIATRIC:  Normal affect   ASSESSMENT AND PLAN:    ***  2. ***  Medication Adjustments/Labs and Tests Ordered: Current medicines are reviewed at length with the patient today.  Concerns regarding medicines are outlined above.  No orders of the defined types were placed in this encounter.  No orders of the defined types were placed in this encounter.   There are no Patient Instructions on file for this visit.   Jarrett Soho, [place]  02/08/2022 8:46 AM    [facility]

## 2022-02-13 DIAGNOSIS — Z23 Encounter for immunization: Secondary | ICD-10-CM | POA: Diagnosis not present

## 2022-02-13 DIAGNOSIS — I10 Essential (primary) hypertension: Secondary | ICD-10-CM | POA: Diagnosis not present

## 2022-02-13 DIAGNOSIS — E559 Vitamin D deficiency, unspecified: Secondary | ICD-10-CM | POA: Diagnosis not present

## 2022-02-13 DIAGNOSIS — I5032 Chronic diastolic (congestive) heart failure: Secondary | ICD-10-CM | POA: Diagnosis not present

## 2022-02-13 DIAGNOSIS — I480 Paroxysmal atrial fibrillation: Secondary | ICD-10-CM | POA: Diagnosis not present

## 2022-02-13 DIAGNOSIS — Z09 Encounter for follow-up examination after completed treatment for conditions other than malignant neoplasm: Secondary | ICD-10-CM | POA: Diagnosis not present

## 2022-02-14 ENCOUNTER — Telehealth (HOSPITAL_COMMUNITY): Payer: Self-pay | Admitting: *Deleted

## 2022-02-14 ENCOUNTER — Encounter (HOSPITAL_COMMUNITY): Payer: Self-pay

## 2022-02-14 ENCOUNTER — Ambulatory Visit (HOSPITAL_COMMUNITY)
Admit: 2022-02-14 | Discharge: 2022-02-14 | Disposition: A | Discharge status: Home / Self Care | Payer: PPO | Attending: Physician Assistant | Admitting: Physician Assistant

## 2022-02-14 VITALS — BP 112/70 | HR 52 | Wt 142.0 lb

## 2022-02-14 DIAGNOSIS — Z01818 Encounter for other preprocedural examination: Secondary | ICD-10-CM | POA: Diagnosis not present

## 2022-02-14 DIAGNOSIS — I480 Paroxysmal atrial fibrillation: Secondary | ICD-10-CM

## 2022-02-14 DIAGNOSIS — I5042 Chronic combined systolic (congestive) and diastolic (congestive) heart failure: Secondary | ICD-10-CM | POA: Insufficient documentation

## 2022-02-14 LAB — BASIC METABOLIC PANEL
Anion gap: 6 (ref 5–15)
BUN: 21 mg/dL (ref 8–23)
CO2: 28 mmol/L (ref 22–32)
Calcium: 9.4 mg/dL (ref 8.9–10.3)
Chloride: 102 mmol/L (ref 98–111)
Creatinine, Ser: 1.06 mg/dL — ABNORMAL HIGH (ref 0.44–1.00)
GFR, Estimated: 50 mL/min — ABNORMAL LOW (ref >60)
Glucose, Bld: 79 mg/dL (ref 70–99)
Potassium: 4.8 mmol/L (ref 3.5–5.1)
Sodium: 136 mmol/L (ref 135–145)

## 2022-02-14 LAB — BRAIN NATRIURETIC PEPTIDE: B Natriuretic Peptide: 517.7 pg/mL — ABNORMAL HIGH (ref 0.0–100.0)

## 2022-02-14 MED ORDER — FUROSEMIDE 20 MG PO TABS: 20.0000 mg | ORAL_TABLET | Freq: Every day | ORAL | 5 refills | Status: DC | PRN

## 2022-02-14 MED ORDER — APIXABAN 5 MG PO TABS: 5.0000 mg | ORAL_TABLET | Freq: Two times a day (BID) | ORAL | 11 refills | Status: DC

## 2022-02-14 MED ORDER — EMPAGLIFLOZIN 10 MG PO TABS: 10.0000 mg | ORAL_TABLET | Freq: Every day | ORAL | 11 refills | Status: DC

## 2022-02-14 MED ORDER — METOPROLOL TARTRATE 25 MG PO TABS: 25.0000 mg | ORAL_TABLET | Freq: Two times a day (BID) | ORAL | 3 refills | Status: DC

## 2022-02-14 MED ORDER — AMIODARONE HCL 200 MG PO TABS: 200.0000 mg | ORAL_TABLET | Freq: Every day | ORAL | 3 refills | Status: DC

## 2022-02-14 NOTE — Patient Instructions (Signed)
There has been no changes to your medications.  Labs done today, your results will be available in MyChart, we will contact you for abnormal readings.  Thank you for allowing [place] to provider your heart failure care after your recent hospitalization. Please follow-up with your cardiologist as scheduled.  If you have any questions, issues, or concerns before your next appointment please call our office at [phone], opt. 2 and leave a message for the triage nurse.

## 2022-02-14 NOTE — Addendum Note (Signed)
Encounter addended by: Stanford Scotland, RN on: 02/14/2022 1:43 PM  Actions taken: Charge Capture section accepted

## 2022-02-14 NOTE — Telephone Encounter (Signed)
Called to confirm Heart & Vascular Transitions of Care appointment at 10 am on 02/14/22. Patient reminded to bring all medications and pill box organizer with them. Confirmed patient has transportation. Gave directions, instructed to utilize [place] parking.  Confirmed appointment with daughter Jana Half.  Confirmed appointment prior to ending call.    Earnestine Leys, BSN, Clinical cytogeneticist Only

## 2022-02-14 NOTE — Progress Notes (Signed)
HEART & VASCULAR TRANSITION OF CARE CONSULT NOTE     Referring Physician: Dr. Acie Fredrickson  Primary Care: Deland Pretty, MD Primary Cardiologist: Mertie Moores, MD   HPI: Referred to clinic by Dr. Acie Fredrickson for heart failure consultation.   88 y/o female w/ chronic combined systolic and diastolic heart failure, recently diagnosed atrial fibrillation on Eliquis, HTN and hypothyroidism.   Echo 04/2013 EF 60-65%, RV normal  2 recent back to back admissions for CHF, 8/23 and again 9/23. Echo done 8/23 hospitalization. EF down to 40-45%, RV normal, in the setting of new atrial fibrillation.  Diuresed w/ IV Lasix. Converted to SR on amio gtt. Transitioned to metoprolol and Eliquis. Discharged home w/ PRN lasix.   Presented back 9/23 w/ recurrent SOB and CP. CXR showed mild pulmonary edema. Back in Afib w/ RVR. HS trop mildly elevated, peaked at 279. Diuresed w/ IV Lasix. Placed on PO amio for rhythm control w/ conversion back to SR.  R/LHC after diuresis showed normal cors and low filling pressures, mRAP 1, mPCW 3, PAP 12/5, CI 5. SGLT2i was added to regimen. Instructed to continue Lasix PRN. Discharged on 10/4. D/c wt 138 lb. Referred to [facility].   Presents to clinic today for assessment, here w/ her daughter-in-law. Wt up 4 lb at 142 lb. ReDs 36%. Feeling better. Breathing improved and back to baseline. No further orthopnea/PND. No LEE. No palpitations. EKG shows sinus bradycardia, 51 bpm, QTC 425 ms. Denies fatigue, dizziness, syncope/ near syncope. BP ok 112/70. Reports compliance w/ meds.    Cardiac Testing   RIGHT/LEFT HEART CATH AND CORONARY ANGIOGRAPHY  01/09/2022    Conclusion   1.  Normal right dominant coronary circulation with no obstructive coronary artery disease. 2.  Mean RA pressure of 1 mmHg, mean wedge pressure of 3 mmHg, cardiac output of 8.5 L/min and cardiac index of 5.1 L/min/m. 3.  LVEDP of 16 mmHg. 4.  Very tortuous right upper extremity that could not be navigated  necessitating a femoral approach.   Recommendation: Goal-directed medical therapy.   Echo 01/04/22 1. Left ventricular ejection fraction, by estimation, is 40 to 45%. The  left ventricle has mildly decreased function. The left ventricle has no  regional wall motion abnormalities. Left ventricular diastolic parameters  are consistent with Grade I  diastolic dysfunction (impaired relaxation). Elevated left ventricular  end-diastolic pressure.   2. Right ventricular systolic function is normal. The right ventricular  size is normal. There is normal pulmonary artery systolic pressure.   3. Left atrial size was moderately dilated.   4. The mitral valve is normal in structure. Trivial mitral valve  regurgitation. No evidence of mitral stenosis.   5. The aortic valve is tricuspid. There is mild calcification of the  aortic valve. There is mild thickening of the aortic valve. Aortic valve  regurgitation is trivial. No aortic stenosis is present.   6. The inferior vena cava is normal in size with greater than 50%  respiratory variability, suggesting right atrial pressure of 3 mmHg.     Review of Systems: [y] = yes, '[ ]'$  = no   General: Weight gain '[ ]'$ ; Weight loss '[ ]'$ ; Anorexia '[ ]'$ ; Fatigue '[ ]'$ ; Fever '[ ]'$ ; Chills '[ ]'$ ; Weakness '[ ]'$   Cardiac: Chest pain/pressure '[ ]'$ ; Resting SOB '[ ]'$ ; Exertional SOB [ Y]; Orthopnea '[ ]'$ ; Pedal Edema '[ ]'$ ; Palpitations '[ ]'$ ; Syncope '[ ]'$ ; Presyncope '[ ]'$ ; Paroxysmal nocturnal dyspnea'[ ]'$   Pulmonary: Cough '[ ]'$ ; Wheezing'[ ]'$ ;  Hemoptysis'[ ]'$ ; Sputum '[ ]'$ ; Snoring '[ ]'$   GI: Vomiting'[ ]'$ ; Dysphagia'[ ]'$ ; Melena'[ ]'$ ; Hematochezia '[ ]'$ ; Heartburn'[ ]'$ ; Abdominal pain '[ ]'$ ; Constipation '[ ]'$ ; Diarrhea '[ ]'$ ; BRBPR '[ ]'$   GU: Hematuria'[ ]'$ ; Dysuria '[ ]'$ ; Nocturia'[ ]'$   Vascular: Pain in legs with walking '[ ]'$ ; Pain in feet with lying flat '[ ]'$ ; Non-healing sores '[ ]'$ ; Stroke '[ ]'$ ; TIA '[ ]'$ ; Slurred speech '[ ]'$ ;  Neuro: Headaches'[ ]'$ ; Vertigo'[ ]'$ ; Seizures'[ ]'$ ; Paresthesias'[ ]'$ ;Blurred vision '[ ]'$ ; Diplopia '[ ]'$ ;  Vision changes '[ ]'$   Ortho/Skin: Arthritis '[ ]'$ ; Joint pain '[ ]'$ ; Muscle pain '[ ]'$ ; Joint swelling '[ ]'$ ; Back Pain '[ ]'$ ; Rash '[ ]'$   Psych: Depression'[ ]'$ ; Anxiety'[ ]'$   Heme: Bleeding problems '[ ]'$ ; Clotting disorders '[ ]'$ ; Anemia '[ ]'$   Endocrine: Diabetes '[ ]'$ ; Thyroid dysfunction'[ ]'$    Past Medical History:  Diagnosis Date   Abnormal ECG    a. Pt aware of long hx of abnl ecg->h/o normal stress testing in [place] ~ 2000.   Diastolic dysfunction    a. 04/2013 Echo: EF 60-65%, No rwma, Gr 1 DD, mild MR.   GERD (gastroesophageal reflux disease)    Hypertension    Hypothyroidism     Current Outpatient Medications  Medication Sig Dispense Refill   acetaminophen (TYLENOL) 500 MG tablet Take 1,000 mg by mouth every 6 (six) hours as needed for moderate pain.     lactose free nutrition (BOOST) LIQD Take 237 mLs by mouth at bedtime.     levothyroxine (SYNTHROID, LEVOTHROID) 88 MCG tablet Take 88 mcg by mouth daily before breakfast.     Multiple Vitamin (MULTIVITAMIN WITH MINERALS) TABS tablet Take 1 tablet by mouth daily.     MYRBETRIQ 50 MG TB24 tablet Take 50 mg by mouth daily.     pantoprazole (PROTONIX) 40 MG tablet Take 40 mg by mouth 2 (two) times daily.     amiodarone (PACERONE) 200 MG tablet Take 1 tablet (200 mg total) by mouth daily. 90 tablet 3   apixaban (ELIQUIS) 5 MG TABS tablet Take 1 tablet (5 mg total) by mouth 2 (two) times daily. 60 tablet 11   empagliflozin (JARDIANCE) 10 MG TABS tablet Take 1 tablet (10 mg total) by mouth daily. 30 tablet 11   furosemide (LASIX) 20 MG tablet Take 1 tablet (20 mg total) by mouth daily as needed for edema or fluid (as needed for weight increase 2 to 3 lbs in 24 hrs or 5 lbs in 7 days.). 30 tablet 5   metoprolol tartrate (LOPRESSOR) 25 MG tablet Take 1 tablet (25 mg total) by mouth 2 (two) times daily. 90 tablet 3   No current facility-administered medications for this encounter.    No Known Allergies    Social History   Socioeconomic History    Marital status: Widowed    Spouse name: Not on file   Number of children: 4   Years of education: Not on file   Highest education level: High school graduate  Occupational History   Occupation: Retired  Tobacco Use   Smoking status: Never   Smokeless tobacco: Never  Vaping Use   Vaping Use: Never used  Substance and Sexual Activity   Alcohol use: Never   Drug use: No   Sexual activity: Not on file  Other Topics Concern   Not on file  Social History Narrative   Moved to [place] from [place] in June 2014.  Lives at [facility].  Was exercising daily.   Social Determinants of Health   Financial Resource Strain: Low Risk  (02/06/2022)   Overall Financial Resource Strain (CARDIA)    Difficulty of Paying Living Expenses: Not very hard  Food Insecurity: No Food Insecurity (02/06/2022)   Hunger Vital Sign    Worried About Running Out of Food in the Last Year: Never true    Ran Out of Food in the Last Year: Never true  Transportation Needs: No Transportation Needs (02/06/2022)   PRAPARE - Hydrologist (Medical): No    Lack of Transportation (Non-Medical): No  Physical Activity: Not on file  Stress: Not on file  Social Connections: Not on file  Intimate Partner Violence: Not on file      Family History  Problem Relation Age of Onset   Heart attack Mother        died @ 36.   Diabetes type II Father        died @ 26.   Breast cancer Sister        died @ 78.    Vitals:   2022/02/27 1023  BP: 112/70  Pulse: (!) 52  SpO2: 97%  Weight: 64.4 kg (142 lb)    PHYSICAL EXAM: General:  Well appearing, elderly. Kyphotic posture. No respiratory difficulty HEENT: normal Neck: supple. no JVD. Carotids 2+ bilat; no bruits. No lymphadenopathy or thryomegaly appreciated. Cor: PMI nondisplaced. Regular rate & rhythm. No rubs, gallops or murmurs. Lungs: clear Abdomen: soft, nontender, nondistended. No hepatosplenomegaly. No bruits or  masses. Good bowel sounds. Extremities: no cyanosis, clubbing, rash, edema Neuro: alert & oriented x 3, cranial nerves grossly intact. moves all 4 extremities w/o difficulty. Affect pleasant.  ECG: Sinus bradycardia 51 bpm    ASSESSMENT & PLAN:  1. Chronic Combined Systolic and Diastolic Heart Failure - Echo 2014 normal EF - Echo 8/23 EF 40-45%, RV ok. Suspect tachy mediated from rapid afib - LHC w/ normal cors. RHC w/ low normal filling pressures and normal CO  - NYHA Class II. Grossly euvolemic on exam. Maintaining NSR on EKG - Continue Jardianec 10 mg daily  - Continue Lasix PRN - Continue Metoprolol 25 mg bid - Avoiding MRA given age - Discussed daily wts, sodium and fluid restriction  - Check BMP and BNP today   2. PAF - maintaining NSR on PO amio 200 mg daily (recently started) - QTc ok on EKG - Cardiology to follow HFT/TFTs - on Eliquis 5 mg bid. Denies abnormal bleeding  3. Hypertension  - controlled on current regimen   4. Sinus Bradycardia - EKG 51 bpm, on amiodarone and metoprolol for mantanence of SR  - asymptomatic. Will continue current regiment for now, as she does not tolerate Afib well w/ CHF - educated on symptoms of bradycardia. If develops symptoms, can reduce metoprolol to 12.5 mg bid. Cardiology to follow     NYHA II GDMT  Diuretic- Lasix 20 mg PRN  BB- Metoprolol 25 mg bid  Ace/ARB/ARNI No MRA No  SGLT2i Jardiance 10 mg daily     Referred to HFSW (PCP, Medications, Transportation, ETOH Abuse, Drug Abuse, Insurance, Museum/gallery curator ): No  Refer to Pharmacy: No Refer to Home Health: No Refer to Advanced Heart Failure Clinic: No  Refer to General Cardiology: Yes   Follow up w/ gen cards as planned

## 2022-02-14 NOTE — Progress Notes (Signed)
ReDS Vest / Clip - 02/14/22 1000       ReDS Vest / Clip   Station Marker A    Ruler Value 30    ReDS Value Range Moderate volume overload    ReDS Actual Value 36

## 2022-02-16 ENCOUNTER — Ambulatory Visit: Payer: PPO | Admitting: Physician Assistant

## 2022-02-27 ENCOUNTER — Encounter: Payer: Self-pay | Admitting: Physician Assistant

## 2022-02-27 ENCOUNTER — Ambulatory Visit: Payer: PPO | Attending: Physician Assistant | Admitting: Physician Assistant

## 2022-02-27 VITALS — BP 120/82 | HR 56 | Ht 65.0 in | Wt 141.5 lb

## 2022-02-27 DIAGNOSIS — I10 Essential (primary) hypertension: Secondary | ICD-10-CM | POA: Diagnosis not present

## 2022-02-27 DIAGNOSIS — I5042 Chronic combined systolic (congestive) and diastolic (congestive) heart failure: Secondary | ICD-10-CM

## 2022-02-27 DIAGNOSIS — I480 Paroxysmal atrial fibrillation: Secondary | ICD-10-CM | POA: Diagnosis not present

## 2022-02-27 NOTE — Progress Notes (Signed)
Cardiology Office Note:    Date:  02/27/2022   ID:  Ellen Chandler, DOB 1930-09-04, MRN [id number]  PCP:  Deland Pretty, MD  [facility] Cardiologist:  Mertie Moores, MD  [facility] Electrophysiologist:  None   Chief Complaint: Hospital follow up   History of Present Illness:    Ellen Chandler is a 88 y.o. female with a hx of HTN, paroxysmal Afib, chronic combined systolic and diastolic HF with mildly reduced EF,GERD and hypothyroidism seen for follow up.   Reported she had had stress testing back in 2000 secondary to an abnormal EKG which was reportedly normal. She was previously seen back in 2014 by Dr. Meda Coffee while inpatient in the setting of acute respiratory failure which was felt to be multifactorial with respiratory infection and mild heart failure.  Echocardiogram during that admission showed LVEF of 60 to 65%, no regional wall motion abnormality, grade 1 diastolic dysfunction, mild MR. She was lost to follow-up but was seen again by Cardiology during admission in 12/2021-01/2022 where she presented with worsening dyspnea found to have acute heart failure exacerbation, pneumonia and new Afib with RVR. TTE showed drop in EF to  EF 40-45% with hypokinesis of the septal segments, apical anterior, apical inferior and apex. She underwent LHC/RHC 01/09/22 which showed nonobstructive CAD. Filling pressures were low after diuresis on RHC. For her Afib, she was started on dilt but this was stopped due to hypotension. She received 24h of amiodarone but declined further amiodarone and was ultimately placed on metop and converted back to NSR. She was amenable to start apixaban for [facility]. She was discharged home on 01/12/22. Due to hyponatremia that developed due to overdiuresis, she was discharged on lasix '20mg'$  PO daily prn.    She re-presented on 02/03/22 with progressive dyspnea that had developed after attending a reunion where she admits she had a lot of salty foods. Labs notable for trop  [phone]. BNP 884. CXR with mild pulmonary edema. She was started on lasix '40mg'$  IV BID. Her course has been complicated by recurrent Afib with episodes of RVR for which she was started on dilt gtt. This was subsequently stopped due to hypotension. Treated with amiodarone bolus +gtt >> converted to sinus rhythm overnight>>off amiodarone.  Seen in heart failure Chandler 02/14/22. ReDs 36%. Feeling better. Sinus bradycardic in 50s. No change.   Here today for follow up.  He has given away her potato chips.  Denies chest pain, shortness of breath, orthopnea, PND, syncope, lower extremity edema or melena.  Compliant with medication.     Past Medical History:  Diagnosis Date   Abnormal ECG    a. Pt aware of long hx of abnl ecg->h/o normal stress testing in [place] ~ 2000.   Diastolic dysfunction    a. 04/2013 Echo: EF 60-65%, No rwma, Gr 1 DD, mild MR.   GERD (gastroesophageal reflux disease)    Hypertension    Hypothyroidism     Past Surgical History:  Procedure Laterality Date   ABDOMINAL HYSTERECTOMY     JOINT REPLACEMENT     a. knee left - injury resulted after being attacked by a dog.   LEFT HEART CATH AND CORONARY ANGIOGRAPHY N/A 01/05/2022   Procedure: LEFT HEART CATH AND CORONARY ANGIOGRAPHY;  Surgeon: Belva Crome, MD;  Location: [facility];  Service: Cardiovascular;  Laterality: N/A;   RIGHT/LEFT HEART CATH AND CORONARY ANGIOGRAPHY N/A 01/09/2022   Procedure: RIGHT/LEFT HEART CATH AND CORONARY ANGIOGRAPHY;  Surgeon: Lenna Sciara  K, MD;  Location: [facility];  Service: Cardiovascular;  Laterality: N/A;    Current Medications: Current Meds  Medication Sig   amiodarone (PACERONE) 200 MG tablet Take 1 tablet (200 mg total) by mouth daily.   apixaban (ELIQUIS) 5 MG TABS tablet Take 1 tablet (5 mg total) by mouth 2 (two) times daily.   empagliflozin (JARDIANCE) 10 MG TABS tablet Take 1 tablet (10 mg total) by mouth daily.   furosemide (LASIX) 20 MG tablet  Take 1 tablet (20 mg total) by mouth daily as needed for edema or fluid (as needed for weight increase 2 to 3 lbs in 24 hrs or 5 lbs in 7 days.).   lactose free nutrition (BOOST) LIQD Take 237 mLs by mouth at bedtime.   levothyroxine (SYNTHROID, LEVOTHROID) 88 MCG tablet Take 88 mcg by mouth daily before breakfast.   metoprolol tartrate (LOPRESSOR) 25 MG tablet Take 1 tablet (25 mg total) by mouth 2 (two) times daily.   Multiple Vitamin (MULTIVITAMIN WITH MINERALS) TABS tablet Take 1 tablet by mouth daily.   MYRBETRIQ 50 MG TB24 tablet Take 50 mg by mouth daily.   pantoprazole (PROTONIX) 40 MG tablet Take 40 mg by mouth 2 (two) times daily.     Allergies:   Patient has no known allergies.   Social History   Socioeconomic History   Marital status: Widowed    Spouse name: Not on file   Number of children: 4   Years of education: Not on file   Highest education level: High school graduate  Occupational History   Occupation: Retired  Tobacco Use   Smoking status: Never   Smokeless tobacco: Never  Vaping Use   Vaping Use: Never used  Substance and Sexual Activity   Alcohol use: Never   Drug use: No   Sexual activity: Not on file  Other Topics Concern   Not on file  Social History Narrative   Moved to [place] from [place] in June 2014.  Lives at [facility].  Was exercising daily.   Social Determinants of Health   Financial Resource Strain: Low Risk  (02/06/2022)   Overall Financial Resource Strain (CARDIA)    Difficulty of Paying Living Expenses: Not very hard  Food Insecurity: No Food Insecurity (02/06/2022)   Hunger Vital Sign    Worried About Running Out of Food in the Last Year: Never true    Ran Out of Food in the Last Year: Never true  Transportation Needs: No Transportation Needs (02/06/2022)   PRAPARE - Hydrologist (Medical): No    Lack of Transportation (Non-Medical): No  Physical Activity: Not on file  Stress: Not  on file  Social Connections: Not on file     Family History: The patient's family history includes Breast cancer in her sister; Diabetes type II in her father; Heart attack in her mother.    ROS:   Please see the history of present illness.    All other systems reviewed and are negative.   EKGs/Labs/Other Studies Reviewed:    The following studies were reviewed today:  RIGHT/LEFT HEART CATH AND CORONARY ANGIOGRAPHY  01/09/2022    Conclusion   1.  Normal right dominant coronary circulation with no obstructive coronary artery disease. 2.  Mean RA pressure of 1 mmHg, mean wedge pressure of 3 mmHg, cardiac output of 8.5 L/min and cardiac index of 5.1 L/min/m. 3.  LVEDP of 16 mmHg. 4.  Very tortuous right  upper extremity that could not be navigated necessitating a femoral approach.   Recommendation: Goal-directed medical therapy.   Echo 01/04/22 1. Left ventricular ejection fraction, by estimation, is 40 to 45%. The  left ventricle has mildly decreased function. The left ventricle has no  regional wall motion abnormalities. Left ventricular diastolic parameters  are consistent with Grade I  diastolic dysfunction (impaired relaxation). Elevated left ventricular  end-diastolic pressure.   2. Right ventricular systolic function is normal. The right ventricular  size is normal. There is normal pulmonary artery systolic pressure.   3. Left atrial size was moderately dilated.   4. The mitral valve is normal in structure. Trivial mitral valve  regurgitation. No evidence of mitral stenosis.   5. The aortic valve is tricuspid. There is mild calcification of the  aortic valve. There is mild thickening of the aortic valve. Aortic valve  regurgitation is trivial. No aortic stenosis is present.   6. The inferior vena cava is normal in size with greater than 50%  respiratory variability, suggesting right atrial pressure of 3 mmHg.    EKG:  EKG is  ordered today.  The ekg ordered today  demonstrates sinus bradycardia, T wave inversion inferior lateral lead, PVC  Recent Labs: 01/12/2022: Magnesium 2.2 02/03/2022: ALT 14; Hemoglobin 13.2; Platelets 308 02/04/2022: TSH 4.788 02/14/2022: B Natriuretic Peptide 517.7; BUN 21; Creatinine, Ser 1.06; Potassium 4.8; Sodium 136  Recent Lipid Panel    Component Value Date/Time   CHOL 121 01/05/2022 0434   TRIG 56 01/05/2022 0434   HDL 40 (L) 01/05/2022 0434   CHOLHDL 3.0 01/05/2022 0434   VLDL 11 01/05/2022 0434   LDLCALC 70 01/05/2022 0434     Risk Assessment/Calculations:    CHA2DS2-VASc Score = 5   This indicates a 7.2% annual risk of stroke. The patient's score is based upon: CHF History: 1 HTN History: 1 Diabetes History: 0 Stroke History: 0 Vascular Disease History: 0 Age Score: 2 Gender Score: 1     Physical Exam:    VS:  BP 120/82   Pulse (!) 56   Ht '5\' 5"'$  (1.651 m)   Wt 141 lb 8 oz (64.2 kg)   SpO2 99%   BMI 23.55 kg/m     Wt Readings from Last 3 Encounters:  02/27/22 141 lb 8 oz (64.2 kg)  02/14/22 142 lb (64.4 kg)  02/07/22 137 lb 12.6 oz (62.5 kg)     GEN:  Well nourished, well developed in no acute distress HEENT: Normal NECK: No JVD; No carotid bruits LYMPHATICS: No lymphadenopathy CARDIAC: RRR, no murmurs, rubs, gallops RESPIRATORY:  Clear to auscultation without rales, wheezing or rhonchi  ABDOMEN: Soft, non-tender, non-distended MUSCULOSKELETAL:  No edema; No deformity  SKIN: Warm and dry NEUROLOGIC:  Alert and oriented x 3 PSYCHIATRIC:  Normal affect   ASSESSMENT AND PLAN:    Paroxysmal atrial fibrillation Maintaining sinus rhythm on p.o. amiodarone.  Continue Eliquis for anticoagulation.  No bleeding issue.  2.  Chronic combined CHF Echocardiogram August 2023 with EF of 40 to 45%.  Suspected tachycardia mediated. -Continue medical therapy with Lasix as needed, metoprolol tartrate 25 mg twice daily and Jardiance 10 mg daily -No MRA due to age  81.  Hypertension -Blood  pressure stable and controlled on current medication  4.  Sinus bradycardia Noted heart rate in the 50s.  Her energy is improving gradually since discharge.  At length discussion regarding energy.  If she continues to have low energy, reduce metoprolol to tartrate to  12.5 mg twice daily for few days and see response.  She will let us know if she needs to.  Medication Adjustments/Labs and Tests Ordered: Current medicines are reviewed at length with the patient today.  Concerns regarding medicines are outlined above.  Orders Placed This Encounter  Procedures   EKG 12-Lead   No orders of the defined types were placed in this encounter.   Patient Instructions  Medication Instructions:  Your physician recommends that you continue on your current medications as directed. Please refer to the Current Medication list given to you today. *If you need a refill on your cardiac medications before your next appointment, please call your pharmacy*   Lab Work: None Ordered   Testing/Procedures: None ordered   Follow-Up: At [facility], you and your health needs are our priority.  As part of our continuing mission to provide you with exceptional heart care, we have created designated Provider Care Teams.  These Care Teams include your primary Cardiologist (physician) and Advanced Practice Providers (APPs -  Physician Assistants and Nurse Practitioners) who all work together to provide you with the care you need, when you need it.  We recommend signing up for the patient portal called "MyChart".  Sign up information is provided on this After Visit Summary.  MyChart is used to connect with patients for Virtual Visits (Telemedicine).  Patients are able to view lab/test results, encounter notes, upcoming appointments, etc.  Non-urgent messages can be sent to your provider as well.   To learn more about what you can do with MyChart, go to [email or web address].    Your next appointment:   4  month(s)  The format for your next appointment:   In Person  Provider:   Mertie Moores, MD     Other Instructions   Important Information About Sugar         Signed, Leanor Kail, [place]  02/27/2022 2:20 PM    [place]

## 2022-02-27 NOTE — Patient Instructions (Signed)
Medication Instructions:  Your physician recommends that you continue on your current medications as directed. Please refer to the Current Medication list given to you today. *If you need a refill on your cardiac medications before your next appointment, please call your pharmacy*   Lab Work: None Ordered   Testing/Procedures: None ordered   Follow-Up: At [facility], you and your health needs are our priority.  As part of our continuing mission to provide you with exceptional heart care, we have created designated Provider Care Teams.  These Care Teams include your primary Cardiologist (physician) and Advanced Practice Providers (APPs -  Physician Assistants and Nurse Practitioners) who all work together to provide you with the care you need, when you need it.  We recommend signing up for the patient portal called "MyChart".  Sign up information is provided on this After Visit Summary.  MyChart is used to connect with patients for Virtual Visits (Telemedicine).  Patients are able to view lab/test results, encounter notes, upcoming appointments, etc.  Non-urgent messages can be sent to your provider as well.   To learn more about what you can do with MyChart, go to [email or web address].    Your next appointment:   4 month(s)  The format for your next appointment:   In Person  Provider:   Mertie Moores, MD     Other Instructions   Important Information About Sugar

## 2022-04-13 DIAGNOSIS — M810 Age-related osteoporosis without current pathological fracture: Secondary | ICD-10-CM | POA: Diagnosis not present

## 2022-06-06 DIAGNOSIS — E785 Hyperlipidemia, unspecified: Secondary | ICD-10-CM | POA: Diagnosis not present

## 2022-06-06 DIAGNOSIS — I10 Essential (primary) hypertension: Secondary | ICD-10-CM | POA: Diagnosis not present

## 2022-06-06 DIAGNOSIS — K219 Gastro-esophageal reflux disease without esophagitis: Secondary | ICD-10-CM | POA: Diagnosis not present

## 2022-06-06 DIAGNOSIS — I5032 Chronic diastolic (congestive) heart failure: Secondary | ICD-10-CM | POA: Diagnosis not present

## 2022-06-25 ENCOUNTER — Encounter: Payer: Self-pay | Admitting: Cardiovascular Disease

## 2022-06-25 NOTE — Progress Notes (Unsigned)
Cardiology Office Note:    Date:  06/26/2022   ID:  Ellen Chandler, DOB November 20, 1930, MRN [id number]  PCP:  Deland Pretty, MD   [facility] [place] [facility] Cardiologist:  Mertie Moores, MD     Referring MD: Deland Pretty, MD   Chief Complaint  Patient presents with   Congestive Heart Failure         History of Present Illness: Feb. 20, 2024   Ellen Chandler is a 89 y.o. female with a hx of chronic combined CHF, mild MR  I met her in the hospital in Aug. 2023 when she presented with respiratory failure  Troponins were elevated bu with a flat trend 123,128,102 CXR showed L basilar infiltrate c/w acute pneumonia  CT angio of the chest was negative for PE  Echo showed moderate LV dysfunction with EF 35-40% Cath on Sept. 5, 2023 : - no obstructive CAD  - normal RA pressure  LVEDP = 16  Was readmitted on Sept. 30 with progressive dyspnea after attending a reunion and eating more salt than she should  Had PAF,  was treated with Dilt and amio drips  Converted back to SR     Past Medical History:  Diagnosis Date   Abnormal ECG    a. Pt aware of long hx of abnl ecg->h/o normal stress testing in [place] ~ 2000.   Diastolic dysfunction    a. 04/2013 Echo: EF 60-65%, No rwma, Gr 1 DD, mild MR.   GERD (gastroesophageal reflux disease)    Hypertension    Hypothyroidism     Past Surgical History:  Procedure Laterality Date   ABDOMINAL HYSTERECTOMY     JOINT REPLACEMENT     a. knee left - injury resulted after being attacked by a dog.   LEFT HEART CATH AND CORONARY ANGIOGRAPHY N/A 01/05/2022   Procedure: LEFT HEART CATH AND CORONARY ANGIOGRAPHY;  Surgeon: Belva Crome, MD;  Location: [facility];  Service: Cardiovascular;  Laterality: N/A;   RIGHT/LEFT HEART CATH AND CORONARY ANGIOGRAPHY N/A 01/09/2022   Procedure: RIGHT/LEFT HEART CATH AND CORONARY ANGIOGRAPHY;  Surgeon: Early Osmond, MD;  Location: [facility];  Service: Cardiovascular;   Laterality: N/A;    Current Medications: Current Meds  Medication Sig   amiodarone (PACERONE) 200 MG tablet Take 1 tablet (200 mg total) by mouth daily.   apixaban (ELIQUIS) 5 MG TABS tablet Take 1 tablet (5 mg total) by mouth 2 (two) times daily.   dapagliflozin propanediol (FARXIGA) 10 MG TABS tablet Take 10 mg by mouth daily.   furosemide (LASIX) 20 MG tablet Take 1 tablet (20 mg total) by mouth daily as needed for edema or fluid (as needed for weight increase 2 to 3 lbs in 24 hrs or 5 lbs in 7 days.).   lactose free nutrition (BOOST) LIQD Take 237 mLs by mouth at bedtime.   levothyroxine (SYNTHROID, LEVOTHROID) 88 MCG tablet Take 88 mcg by mouth daily before breakfast.   metoprolol tartrate (LOPRESSOR) 25 MG tablet Take 1 tablet (25 mg total) by mouth 2 (two) times daily.   Multiple Vitamin (MULTIVITAMIN WITH MINERALS) TABS tablet Take 1 tablet by mouth daily.   MYRBETRIQ 50 MG TB24 tablet Take 50 mg by mouth daily.   pantoprazole (PROTONIX) 40 MG tablet Take 40 mg by mouth 2 (two) times daily.     Allergies:   Patient has no known allergies.   Social History   Socioeconomic History   Marital status: Widowed  Spouse name: Not on file   Number of children: 4   Years of education: Not on file   Highest education level: High school graduate  Occupational History   Occupation: Retired  Tobacco Use   Smoking status: Never   Smokeless tobacco: Never  Vaping Use   Vaping Use: Never used  Substance and Sexual Activity   Alcohol use: Never   Drug use: No   Sexual activity: Not on file  Other Topics Concern   Not on file  Social History Narrative   Moved to [place] from [place] in June 2014.  Lives at [facility].  Was exercising daily.   Social Determinants of Health   Financial Resource Strain: Low Risk  (02/06/2022)   Overall Financial Resource Strain (CARDIA)    Difficulty of Paying Living Expenses: Not very hard  Food Insecurity: No Food  Insecurity (02/06/2022)   Hunger Vital Sign    Worried About Running Out of Food in the Last Year: Never true    Ran Out of Food in the Last Year: Never true  Transportation Needs: No Transportation Needs (02/06/2022)   PRAPARE - Hydrologist (Medical): No    Lack of Transportation (Non-Medical): No  Physical Activity: Not on file  Stress: Not on file  Social Connections: Not on file     Family History: The patient's family history includes Breast cancer in her sister; Diabetes type II in her father; Heart attack in her mother.  ROS:   Please see the history of present illness.     All other systems reviewed and are negative.  EKGs/Labs/Other Studies Reviewed:    The following studies were reviewed today:   EKG:     Recent Labs: 01/12/2022: Magnesium 2.2 02/03/2022: ALT 14; Hemoglobin 13.2; Platelets 308 02/04/2022: TSH 4.788 02/14/2022: B Natriuretic Peptide 517.7; BUN 21; Creatinine, Ser 1.06; Potassium 4.8; Sodium 136  Recent Lipid Panel    Component Value Date/Time   CHOL 121 01/05/2022 0434   TRIG 56 01/05/2022 0434   HDL 40 (L) 01/05/2022 0434   CHOLHDL 3.0 01/05/2022 0434   VLDL 11 01/05/2022 0434   LDLCALC 70 01/05/2022 0434     Risk Assessment/Calculations:    CHA2DS2-VASc Score = 5   This indicates a 7.2% annual risk of stroke. The patient's score is based upon: CHF History: 1 HTN History: 1 Diabetes History: 0 Stroke History: 0 Vascular Disease History: 0 Age Score: 2 Gender Score: 1              Physical Exam:    VS:  BP 128/76   Pulse 78   Ht 5' 5"$  (1.651 m)   Wt 141 lb 6.4 oz (64.1 kg)   SpO2 90%   BMI 23.53 kg/m     Wt Readings from Last 3 Encounters:  06/26/22 141 lb 6.4 oz (64.1 kg)  02/27/22 141 lb 8 oz (64.2 kg)  02/14/22 142 lb (64.4 kg)     GEN:  elderly female  in no acute distress HEENT: Normal NECK: No JVD; No carotid bruits LYMPHATICS: No lymphadenopathy CARDIAC: RRR, no murmurs, rubs,  gallops RESPIRATORY:  Clear to auscultation without rales, wheezing or rhonchi  ABDOMEN: Soft, non-tender, non-distended MUSCULOSKELETAL:  No edema; No deformity  SKIN: Warm and dry NEUROLOGIC:  Alert and oriented x 3 PSYCHIATRIC:  Normal affect   ASSESSMENT:    1. Acute on chronic combined systolic and diastolic CHF (congestive heart failure) ([place])  2. Essential hypertension    PLAN:    In order of problems listed above:  Chronic combined CHF :    seems stable   2. PAF :   stable . On Eliquis   Continue same meds.  Will see her in a year.             Medication Adjustments/Labs and Tests Ordered: Current medicines are reviewed at length with the patient today.  Concerns regarding medicines are outlined above.  No orders of the defined types were placed in this encounter.  No orders of the defined types were placed in this encounter.   Patient Instructions  Medication Instructions:  The current medical regimen is effective;  continue present plan and medications.  *If you need a refill on your cardiac medications before your next appointment, please call your pharmacy*  Follow-Up: At [facility], you and your health needs are our priority.  As part of our continuing mission to provide you with exceptional heart care, we have created designated Provider Care Teams.  These Care Teams include your primary Cardiologist (physician) and Advanced Practice Providers (APPs -  Physician Assistants and Nurse Practitioners) who all work together to provide you with the care you need, when you need it.  We recommend signing up for the patient portal called "MyChart".  Sign up information is provided on this After Visit Summary.  MyChart is used to connect with patients for Virtual Visits (Telemedicine).  Patients are able to view lab/test results, encounter notes, upcoming appointments, etc.  Non-urgent messages can be sent to your provider as well.   To learn more about  what you can do with MyChart, go to [email or web address].    Your next appointment:   1 year(s)  Provider:   Mertie Moores, MD        Signed, Mertie Moores, MD  06/26/2022 11:46 AM    [place]

## 2022-06-26 ENCOUNTER — Encounter: Payer: Self-pay | Admitting: Cardiovascular Disease

## 2022-06-26 ENCOUNTER — Ambulatory Visit: Payer: PPO | Attending: Cardiovascular Disease | Admitting: Cardiovascular Disease

## 2022-06-26 VITALS — BP 128/76 | HR 78 | Ht 65.0 in | Wt 141.4 lb

## 2022-06-26 DIAGNOSIS — I5043 Acute on chronic combined systolic (congestive) and diastolic (congestive) heart failure: Secondary | ICD-10-CM

## 2022-06-26 DIAGNOSIS — I10 Essential (primary) hypertension: Secondary | ICD-10-CM

## 2022-06-26 NOTE — Patient Instructions (Signed)
Medication Instructions:  The current medical regimen is effective;  continue present plan and medications.  *If you need a refill on your cardiac medications before your next appointment, please call your pharmacy*  Follow-Up: At [facility], you and your health needs are our priority.  As part of our continuing mission to provide you with exceptional heart care, we have created designated Provider Care Teams.  These Care Teams include your primary Cardiologist (physician) and Advanced Practice Providers (APPs -  Physician Assistants and Nurse Practitioners) who all work together to provide you with the care you need, when you need it.  We recommend signing up for the patient portal called "MyChart".  Sign up information is provided on this After Visit Summary.  MyChart is used to connect with patients for Virtual Visits (Telemedicine).  Patients are able to view lab/test results, encounter notes, upcoming appointments, etc.  Non-urgent messages can be sent to your provider as well.   To learn more about what you can do with MyChart, go to [email or web address].    Your next appointment:   1 year(s)  Provider:   Mertie Moores, MD

## 2022-07-09 ENCOUNTER — Inpatient Hospital Stay (HOSPITAL_COMMUNITY)
Admission: EM | Admit: 2022-07-09 | Discharge: 2022-07-11 | DRG: 291 | Disposition: A | Discharge status: Home / Self Care | Payer: PPO | Attending: Internal Medicine | Admitting: Internal Medicine

## 2022-07-09 ENCOUNTER — Inpatient Hospital Stay (HOSPITAL_COMMUNITY): Payer: PPO

## 2022-07-09 ENCOUNTER — Other Ambulatory Visit: Payer: Self-pay

## 2022-07-09 ENCOUNTER — Emergency Department (HOSPITAL_COMMUNITY): Payer: PPO

## 2022-07-09 ENCOUNTER — Encounter (HOSPITAL_COMMUNITY): Payer: Self-pay

## 2022-07-09 DIAGNOSIS — K449 Diaphragmatic hernia without obstruction or gangrene: Secondary | ICD-10-CM | POA: Diagnosis not present

## 2022-07-09 DIAGNOSIS — Z7901 Long term (current) use of anticoagulants: Secondary | ICD-10-CM

## 2022-07-09 DIAGNOSIS — R062 Wheezing: Secondary | ICD-10-CM | POA: Diagnosis not present

## 2022-07-09 DIAGNOSIS — Z79890 Hormone replacement therapy (postmenopausal): Secondary | ICD-10-CM | POA: Diagnosis not present

## 2022-07-09 DIAGNOSIS — R0902 Hypoxemia: Secondary | ICD-10-CM | POA: Diagnosis not present

## 2022-07-09 DIAGNOSIS — R230 Cyanosis: Secondary | ICD-10-CM | POA: Diagnosis not present

## 2022-07-09 DIAGNOSIS — Z79899 Other long term (current) drug therapy: Secondary | ICD-10-CM

## 2022-07-09 DIAGNOSIS — J810 Acute pulmonary edema: Principal | ICD-10-CM

## 2022-07-09 DIAGNOSIS — Z66 Do not resuscitate: Secondary | ICD-10-CM | POA: Diagnosis present

## 2022-07-09 DIAGNOSIS — I5021 Acute systolic (congestive) heart failure: Secondary | ICD-10-CM

## 2022-07-09 DIAGNOSIS — K219 Gastro-esophageal reflux disease without esophagitis: Secondary | ICD-10-CM | POA: Diagnosis present

## 2022-07-09 DIAGNOSIS — Z8249 Family history of ischemic heart disease and other diseases of the circulatory system: Secondary | ICD-10-CM | POA: Diagnosis not present

## 2022-07-09 DIAGNOSIS — I10 Essential (primary) hypertension: Secondary | ICD-10-CM | POA: Diagnosis present

## 2022-07-09 DIAGNOSIS — E039 Hypothyroidism, unspecified: Secondary | ICD-10-CM | POA: Diagnosis present

## 2022-07-09 DIAGNOSIS — Z1152 Encounter for screening for COVID-19: Secondary | ICD-10-CM

## 2022-07-09 DIAGNOSIS — Z833 Family history of diabetes mellitus: Secondary | ICD-10-CM

## 2022-07-09 DIAGNOSIS — I480 Paroxysmal atrial fibrillation: Secondary | ICD-10-CM | POA: Diagnosis not present

## 2022-07-09 DIAGNOSIS — E871 Hypo-osmolality and hyponatremia: Secondary | ICD-10-CM | POA: Diagnosis present

## 2022-07-09 DIAGNOSIS — J9601 Acute respiratory failure with hypoxia: Secondary | ICD-10-CM | POA: Diagnosis present

## 2022-07-09 DIAGNOSIS — I110 Hypertensive heart disease with heart failure: Secondary | ICD-10-CM | POA: Diagnosis not present

## 2022-07-09 DIAGNOSIS — I959 Hypotension, unspecified: Secondary | ICD-10-CM | POA: Diagnosis not present

## 2022-07-09 DIAGNOSIS — I493 Ventricular premature depolarization: Secondary | ICD-10-CM | POA: Diagnosis not present

## 2022-07-09 DIAGNOSIS — J811 Chronic pulmonary edema: Secondary | ICD-10-CM | POA: Diagnosis not present

## 2022-07-09 DIAGNOSIS — I5033 Acute on chronic diastolic (congestive) heart failure: Secondary | ICD-10-CM | POA: Diagnosis present

## 2022-07-09 DIAGNOSIS — R0602 Shortness of breath: Secondary | ICD-10-CM | POA: Diagnosis not present

## 2022-07-09 DIAGNOSIS — I5043 Acute on chronic combined systolic (congestive) and diastolic (congestive) heart failure: Secondary | ICD-10-CM | POA: Diagnosis not present

## 2022-07-09 LAB — CBC WITH DIFFERENTIAL/PLATELET
Abs Immature Granulocytes: 0.06 10*3/uL (ref 0.00–0.07)
Basophils Absolute: 0 10*3/uL (ref 0.0–0.1)
Basophils Relative: 1 %
Eosinophils Absolute: 0.3 10*3/uL (ref 0.0–0.5)
Eosinophils Relative: 4 %
HCT: 45.5 % (ref 36.0–46.0)
Hemoglobin: 15.4 g/dL — ABNORMAL HIGH (ref 12.0–15.0)
Immature Granulocytes: 1 %
Lymphocytes Relative: 27 %
Lymphs Abs: 2 10*3/uL (ref 0.7–4.0)
MCH: 32.6 pg (ref 26.0–34.0)
MCHC: 33.8 g/dL (ref 30.0–36.0)
MCV: 96.4 fL (ref 80.0–100.0)
Monocytes Absolute: 0.8 10*3/uL (ref 0.1–1.0)
Monocytes Relative: 11 %
Neutro Abs: 4.3 10*3/uL (ref 1.7–7.7)
Neutrophils Relative %: 56 %
Platelets: 322 10*3/uL (ref 150–400)
RBC: 4.72 MIL/uL (ref 3.87–5.11)
RDW: 13.8 % (ref 11.5–15.5)
WBC: 7.4 10*3/uL (ref 4.0–10.5)
nRBC: 0 % (ref 0.0–0.2)

## 2022-07-09 LAB — RESP PANEL BY RT-PCR (RSV, FLU A&B, COVID)  RVPGX2
Influenza A by PCR: NEGATIVE
Influenza B by PCR: NEGATIVE
Resp Syncytial Virus by PCR: NEGATIVE
SARS Coronavirus 2 by RT PCR: NEGATIVE

## 2022-07-09 LAB — COMPREHENSIVE METABOLIC PANEL
ALT: 22 U/L (ref 0–44)
ALT: 22 U/L (ref 0–44)
AST: 44 U/L — ABNORMAL HIGH (ref 15–41)
AST: 34 U/L (ref 15–41)
Albumin: 3.1 g/dL — ABNORMAL LOW (ref 3.5–5.0)
Albumin: 3.2 g/dL — ABNORMAL LOW (ref 3.5–5.0)
Alkaline Phosphatase: 37 U/L — ABNORMAL LOW (ref 38–126)
Alkaline Phosphatase: 33 U/L — ABNORMAL LOW (ref 38–126)
Anion gap: 9 (ref 5–15)
Anion gap: 8 (ref 5–15)
BUN: 29 mg/dL — ABNORMAL HIGH (ref 8–23)
BUN: 28 mg/dL — ABNORMAL HIGH (ref 8–23)
CO2: 24 mmol/L (ref 22–32)
CO2: 27 mmol/L (ref 22–32)
Calcium: 8.5 mg/dL — ABNORMAL LOW (ref 8.9–10.3)
Calcium: 8.6 mg/dL — ABNORMAL LOW (ref 8.9–10.3)
Chloride: 101 mmol/L (ref 98–111)
Chloride: 101 mmol/L (ref 98–111)
Creatinine, Ser: 1.11 mg/dL — ABNORMAL HIGH (ref 0.44–1.00)
Creatinine, Ser: 1.19 mg/dL — ABNORMAL HIGH (ref 0.44–1.00)
GFR, Estimated: 48 mL/min — ABNORMAL LOW (ref >60)
GFR, Estimated: 44 mL/min — ABNORMAL LOW (ref >60)
Glucose, Bld: 163 mg/dL — ABNORMAL HIGH (ref 70–99)
Glucose, Bld: 112 mg/dL — ABNORMAL HIGH (ref 70–99)
Potassium: 4.9 mmol/L (ref 3.5–5.1)
Potassium: 4.7 mmol/L (ref 3.5–5.1)
Sodium: 134 mmol/L — ABNORMAL LOW (ref 135–145)
Sodium: 136 mmol/L (ref 135–145)
Total Bilirubin: 1.3 mg/dL — ABNORMAL HIGH (ref 0.3–1.2)
Total Bilirubin: 0.6 mg/dL (ref 0.3–1.2)
Total Protein: 6 g/dL — ABNORMAL LOW (ref 6.5–8.1)
Total Protein: 6 g/dL — ABNORMAL LOW (ref 6.5–8.1)

## 2022-07-09 LAB — ECHOCARDIOGRAM COMPLETE
Area-P 1/2: 3.31 cm2
Height: 65 in
S' Lateral: 2.9 cm
Weight: 2218.71 oz

## 2022-07-09 LAB — CBC
HCT: 44.5 % (ref 36.0–46.0)
Hemoglobin: 15.2 g/dL — ABNORMAL HIGH (ref 12.0–15.0)
MCH: 32.7 pg (ref 26.0–34.0)
MCHC: 34.2 g/dL (ref 30.0–36.0)
MCV: 95.7 fL (ref 80.0–100.0)
Platelets: 305 10*3/uL (ref 150–400)
RBC: 4.65 MIL/uL (ref 3.87–5.11)
RDW: 13.8 % (ref 11.5–15.5)
WBC: 10.6 10*3/uL — ABNORMAL HIGH (ref 4.0–10.5)
nRBC: 0 % (ref 0.0–0.2)

## 2022-07-09 LAB — TSH: TSH: 3.11 u[IU]/mL (ref 0.350–4.500)

## 2022-07-09 LAB — TROPONIN I (HIGH SENSITIVITY)
Troponin I (High Sensitivity): 8 ng/L (ref <18)
Troponin I (High Sensitivity): 10 ng/L (ref <18)

## 2022-07-09 LAB — BRAIN NATRIURETIC PEPTIDE: B Natriuretic Peptide: 484.1 pg/mL — ABNORMAL HIGH (ref 0.0–100.0)

## 2022-07-09 MED ORDER — ORAL CARE MOUTH RINSE: 15.0000 mL | OROMUCOSAL | Status: DC | PRN

## 2022-07-09 MED ORDER — ACETAMINOPHEN 650 MG RE SUPP: 650.0000 mg | Freq: Four times a day (QID) | RECTAL | Status: DC | PRN

## 2022-07-09 MED ORDER — LEVOTHYROXINE SODIUM 88 MCG PO TABS
88.0000 ug | ORAL_TABLET | Freq: Every day | ORAL | Status: DC
  Administered 2022-07-09 – 2022-07-11 (×3): 88 ug via ORAL
  Filled 2022-07-09 (×3): qty 1

## 2022-07-09 MED ORDER — MIRABEGRON ER 50 MG PO TB24
50.0000 mg | ORAL_TABLET | Freq: Every day | ORAL | Status: DC
  Administered 2022-07-09 – 2022-07-11 (×3): 50 mg via ORAL
  Filled 2022-07-09 (×3): qty 1

## 2022-07-09 MED ORDER — FUROSEMIDE 10 MG/ML IJ SOLN
40.0000 mg | Freq: Once | INTRAMUSCULAR | Status: AC
  Administered 2022-07-09: 40 mg via INTRAVENOUS
  Filled 2022-07-09: qty 4

## 2022-07-09 MED ORDER — AMIODARONE HCL 200 MG PO TABS
200.0000 mg | ORAL_TABLET | Freq: Every day | ORAL | Status: DC
  Administered 2022-07-09 – 2022-07-11 (×3): 200 mg via ORAL
  Filled 2022-07-09 (×3): qty 1

## 2022-07-09 MED ORDER — METOPROLOL TARTRATE 12.5 MG HALF TABLET
12.5000 mg | ORAL_TABLET | Freq: Two times a day (BID) | ORAL | Status: DC
  Administered 2022-07-09 – 2022-07-11 (×5): 12.5 mg via ORAL
  Filled 2022-07-09 (×5): qty 1

## 2022-07-09 MED ORDER — APIXABAN 5 MG PO TABS
5.0000 mg | ORAL_TABLET | Freq: Two times a day (BID) | ORAL | Status: DC
  Administered 2022-07-09 – 2022-07-11 (×5): 5 mg via ORAL
  Filled 2022-07-09 (×5): qty 1

## 2022-07-09 MED ORDER — PANTOPRAZOLE SODIUM 40 MG PO TBEC
40.0000 mg | DELAYED_RELEASE_TABLET | Freq: Two times a day (BID) | ORAL | Status: DC
  Administered 2022-07-09 – 2022-07-11 (×5): 40 mg via ORAL
  Filled 2022-07-09 (×5): qty 1

## 2022-07-09 MED ORDER — DAPAGLIFLOZIN PROPANEDIOL 10 MG PO TABS
10.0000 mg | ORAL_TABLET | Freq: Every day | ORAL | Status: DC
  Administered 2022-07-09 – 2022-07-11 (×3): 10 mg via ORAL
  Filled 2022-07-09 (×3): qty 1

## 2022-07-09 MED ORDER — LIDOCAINE 5 % EX PTCH
1.0000 | MEDICATED_PATCH | CUTANEOUS | Status: DC
  Filled 2022-07-09 (×3): qty 1

## 2022-07-09 MED ORDER — ACETAMINOPHEN 325 MG PO TABS: 650.0000 mg | ORAL_TABLET | Freq: Four times a day (QID) | ORAL | Status: DC | PRN

## 2022-07-09 MED ORDER — ONDANSETRON HCL 4 MG/2ML IJ SOLN: 4.0000 mg | Freq: Once | INTRAMUSCULAR | Status: DC

## 2022-07-09 NOTE — ED Notes (Signed)
ED TO INPATIENT HANDOFF REPORT  ED Nurse Name and Phone #: [phone] Hassell Patras  S Name/Age/Gender Ellen Chandler 89 y.o. female Room/Bed: 026C/026C  Code Status   Code Status: DNR  Home/SNF/Other Indpendent living Patient oriented to: self, place, and situation Is this baseline? Yes   Triage Complete: Triage complete  Chief Complaint Acute on chronic combined systolic (congestive) and diastolic (congestive) heart failure ([place]) [I50.43]  Triage Note BIB EMS from [facility] indep living due to SOB x3 hours sating in low 70s. Audible rales for EMS. Hx CHF. Wheezing in [facility] gave duoneb. 20 L AC. CPAP placed. Sats up to 97% on bipap.    Allergies No Known Allergies  Level of Care/Admitting Diagnosis ED Disposition     ED Disposition  Admit   Condition  --   Comment  Hospital Area: [facility] [[id number]]  Level of Care: Progressive [102]  Admit to Progressive based on following criteria: CARDIOVASCULAR & THORACIC of moderate stability with acute coronary syndrome symptoms/low risk myocardial infarction/hypertensive urgency/arrhythmias/heart failure potentially compromising stability and stable post cardiovascular intervention patients.  May admit patient to [facility] or [facility] if equivalent level of care is available:: Yes  Covid Evaluation: Asymptomatic - no recent exposure (last 10 days) testing not required  Diagnosis: Acute on chronic combined systolic (congestive) and diastolic (congestive) heart failure [facility]) [id number]  Admitting Physician: Shela Leff [id number]  Attending Physician: Shela Leff [id number]  Certification:: I certify this patient will need inpatient services for at least 2 midnights  Estimated Length of Stay: 2          B Medical/Surgery History Past Medical History:  Diagnosis Date   Abnormal ECG    a. Pt aware of long hx of abnl ecg->h/o normal stress testing in [place] ~ 2000.   Diastolic dysfunction    a.  04/2013 Echo: EF 60-65%, No rwma, Gr 1 DD, mild MR.   GERD (gastroesophageal reflux disease)    Hypertension    Hypothyroidism    Past Surgical History:  Procedure Laterality Date   ABDOMINAL HYSTERECTOMY     JOINT REPLACEMENT     a. knee left - injury resulted after being attacked by a dog.   LEFT HEART CATH AND CORONARY ANGIOGRAPHY N/A 01/05/2022   Procedure: LEFT HEART CATH AND CORONARY ANGIOGRAPHY;  Surgeon: Belva Crome, MD;  Location: [facility];  Service: Cardiovascular;  Laterality: N/A;   RIGHT/LEFT HEART CATH AND CORONARY ANGIOGRAPHY N/A 01/09/2022   Procedure: RIGHT/LEFT HEART CATH AND CORONARY ANGIOGRAPHY;  Surgeon: Early Osmond, MD;  Location: [facility] [facility];  Service: Cardiovascular;  Laterality: N/A;     A IV Location/Drains/Wounds Patient Lines/Drains/Airways Status     Active Line/Drains/Airways     Name Placement date Placement time Site Days   Peripheral IV 02/05/22 20 G Anterior;Left Forearm 02/05/22  0915  Forearm  154   Peripheral IV 20 G Left Antecubital --  --  Antecubital  --   External Urinary Catheter 02/03/22  2100  --  156            Intake/Output Last 24 hours No intake or output data in the 24 hours ending 07/09/22 0446  Labs/Imaging Results for orders placed or performed during the hospital encounter of 07/09/22 (from the past 48 hour(s))  CBC with Differential     Status: Abnormal   Collection Time: 07/09/22  1:56 AM  Result Value Ref Range   WBC 7.4 4.0 - 10.5 K/uL  RBC 4.72 3.87 - 5.11 MIL/uL   Hemoglobin 15.4 (H) 12.0 - 15.0 g/dL   HCT 45.5 36.0 - 46.0 %   MCV 96.4 80.0 - 100.0 fL   MCH 32.6 26.0 - 34.0 pg   MCHC 33.8 30.0 - 36.0 g/dL   RDW 13.8 11.5 - 15.5 %   Platelets 322 150 - 400 K/uL   nRBC 0.0 0.0 - 0.2 %   Neutrophils Relative % 56 %   Neutro Abs 4.3 1.7 - 7.7 K/uL   Lymphocytes Relative 27 %   Lymphs Abs 2.0 0.7 - 4.0 K/uL   Monocytes Relative 11 %   Monocytes Absolute 0.8 0.1 - 1.0 K/uL   Eosinophils  Relative 4 %   Eosinophils Absolute 0.3 0.0 - 0.5 K/uL   Basophils Relative 1 %   Basophils Absolute 0.0 0.0 - 0.1 K/uL   Immature Granulocytes 1 %   Abs Immature Granulocytes 0.06 0.00 - 0.07 K/uL    Comment: Performed at [facility] [address] [place], [place] [address]  Comprehensive metabolic panel     Status: Abnormal   Collection Time: 07/09/22  1:56 AM  Result Value Ref Range   Sodium 134 (L) 135 - 145 mmol/L   Potassium 4.9 3.5 - 5.1 mmol/L    Comment: HEMOLYSIS AT THIS LEVEL MAY AFFECT RESULT   Chloride 101 98 - 111 mmol/L   CO2 24 22 - 32 mmol/L   Glucose, Bld 163 (H) 70 - 99 mg/dL    Comment: Glucose reference range applies only to samples taken after fasting for at least 8 hours.   BUN 29 (H) 8 - 23 mg/dL   Creatinine, Ser 1.11 (H) 0.44 - 1.00 mg/dL   Calcium 8.5 (L) 8.9 - 10.3 mg/dL   Total Protein 6.0 (L) 6.5 - 8.1 g/dL   Albumin 3.1 (L) 3.5 - 5.0 g/dL   AST 44 (H) 15 - 41 U/L    Comment: HEMOLYSIS AT THIS LEVEL MAY AFFECT RESULT   ALT 22 0 - 44 U/L    Comment: HEMOLYSIS AT THIS LEVEL MAY AFFECT RESULT   Alkaline Phosphatase 37 (L) 38 - 126 U/L   Total Bilirubin 1.3 (H) 0.3 - 1.2 mg/dL    Comment: HEMOLYSIS AT THIS LEVEL MAY AFFECT RESULT   GFR, Estimated 48 (L) >60 mL/min    Comment: (NOTE) Calculated using the CKD-EPI Creatinine Equation (2021)    Anion gap 9 5 - 15    Comment: Performed at [facility], [place] [address], [place], [place] [address]  Brain natriuretic peptide     Status: Abnormal   Collection Time: 07/09/22  1:56 AM  Result Value Ref Range   B Natriuretic Peptide 484.1 (H) 0.0 - 100.0 pg/mL    Comment: Performed at [facility] [address], [place], [place] [address]  Troponin I (High Sensitivity)     Status: None   Collection Time: 07/09/22  1:56 AM  Result Value Ref Range   Troponin I (High Sensitivity) 8 <18 ng/L    Comment: (NOTE) Elevated high sensitivity troponin I (hsTnI) values and significant  changes  across serial measurements may suggest ACS but many other  chronic and acute conditions are known to elevate hsTnI results.  Refer to the "Links" section for chest pain algorithms and additional  guidance. Performed at [facility], [place] [address], [place], [place] [address]    DG Chest Portable 1 View  Result Date: 07/09/2022  CLINICAL DATA:  Shortness of breath. EXAM: PORTABLE CHEST 1 VIEW COMPARISON:  02/03/2022. FINDINGS: The heart is enlarged and the mediastinal contour is stable. There is a large hiatal hernia. Pulmonary vascular congestion is present. Atherosclerotic calcification of the aorta is noted. Patchy perihilar airspace disease is noted bilaterally. No effusion or pneumothorax. No acute osseous abnormality. IMPRESSION: 1. Cardiomegaly with pulmonary vascular congestion. 2. Patchy perihilar airspace disease bilaterally suggesting edema. Correlate clinically to exclude infection. 3. Large hiatal hernia. Electronically Signed   By: Brett Fairy M.D.   On: 07/09/2022 02:16    Pending Labs Unresulted Labs (From admission, onward)     Start     Ordered   07/09/22 0500  CBC  Tomorrow morning,   R        07/09/22 0442   07/09/22 0500  Comprehensive metabolic panel  Tomorrow morning,   R        07/09/22 0442   07/09/22 0311  Resp panel by RT-PCR (RSV, Flu A&B, Covid) Anterior Nasal Swab  Once,   URGENT       Question Answer Comment  Patient immune status Normal   Release to patient Immediate      07/09/22 0310            Vitals/Pain Today's Vitals   07/09/22 0210 07/09/22 0330 07/09/22 0400 07/09/22 0430  BP: (!) 104/57 120/67 135/63 119/61  Pulse: 63 (!) 56 (!) 53 (!) 54  Resp: (!) 21 (!) 21 (!) 21 (!) 22  Temp:      TempSrc:      SpO2: 92% 93% 96% 95%  Weight:      Height:      PainSc:        Isolation Precautions No active isolations  Medications Medications  ondansetron (ZOFRAN) injection 4 mg (4 mg Intravenous Not Given 07/09/22 0345)  acetaminophen  (TYLENOL) tablet 650 mg (has no administration in time range)    Or  acetaminophen (TYLENOL) suppository 650 mg (has no administration in time range)  lidocaine (LIDODERM) 5 % 1 patch (has no administration in time range)  furosemide (LASIX) injection 40 mg (40 mg Intravenous Given 07/09/22 0343)    Mobility walks with device     Focused Assessments Cardiac Assessment Handoff:  Cardiac Rhythm: Sinus bradycardia Lab Results  Component Value Date   CKTOTAL 58 02/14/2021   TROPONINI <0.03 06/18/2016   Lab Results  Component Value Date   DDIMER <0.27 06/17/2016   Does the Patient currently have chest pain? No   , Pulmonary Assessment Handoff:  Lung sounds: Bilateral Breath Sounds: Coarse crackles O2 Device: Bi-PAP      R Recommendations: See Admitting Provider Note  Report given to:   Additional Notes: patient on bipap.

## 2022-07-09 NOTE — H&P (Signed)
History and Physical    Ellen Chandler [id number] DOB: 1931/04/04 DOA: 07/09/2022  PCP: Deland Pretty, MD  Patient coming from: Independent living facility  Chief Complaint: Shortness of breath  HPI: Ellen Chandler is a 89 y.o. female with medical history significant of chronic combined CHF, hypertension, hypothyroidism, paroxysmal A-fib on Eliquis presented to ED via EMS for evaluation of shortness of breath.  Oxygen saturation in the low 70s on room air and was placed on BiPAP with improvement of oxygen saturation to 90s.  EMS gave her a DuoNeb treatment.  Afebrile.  Labs showing no leukocytosis, hemoglobin 15.4, sodium 134, BUN 29, creatinine 1.1 (at baseline), BNP 484, troponin negative, COVID/influenza/RSV PCR pending.  Chest x-ray showing pulmonary edema. Patient received IV Lasix 40 mg in the ED.  TRH called to admit.  History limited as patient is currently on BiPAP.  States she has been feeling sluggish for the past few days and tonight when she got up to use the bathroom around midnight she suddenly felt very short of breath.  Denies fevers or chest pain.  Denies lower extremity edema or weight gain.  States she has PRN Lasix at home which sits at her bedside and she has not required it until tonight when she felt short of breath she took 1 dose.  She is requesting medication for back pain which started since she has been in the ED stretcher.  Denies any falls or injuries to her back.  Review of Systems:  Review of Systems  All other systems reviewed and are negative.   Past Medical History:  Diagnosis Date   Abnormal ECG    a. Pt aware of long hx of abnl ecg->h/o normal stress testing in [place] ~ 2000.   Diastolic dysfunction    a. 04/2013 Echo: EF 60-65%, No rwma, Gr 1 DD, mild MR.   GERD (gastroesophageal reflux disease)    Hypertension    Hypothyroidism     Past Surgical History:  Procedure Laterality Date   ABDOMINAL HYSTERECTOMY     JOINT REPLACEMENT      a. knee left - injury resulted after being attacked by a dog.   LEFT HEART CATH AND CORONARY ANGIOGRAPHY N/A 01/05/2022   Procedure: LEFT HEART CATH AND CORONARY ANGIOGRAPHY;  Surgeon: Belva Crome, MD;  Location: [facility];  Service: Cardiovascular;  Laterality: N/A;   RIGHT/LEFT HEART CATH AND CORONARY ANGIOGRAPHY N/A 01/09/2022   Procedure: RIGHT/LEFT HEART CATH AND CORONARY ANGIOGRAPHY;  Surgeon: Early Osmond, MD;  Location: [facility];  Service: Cardiovascular;  Laterality: N/A;     reports that she has never smoked. She has never used smokeless tobacco. She reports that she does not drink alcohol and does not use drugs.  No Known Allergies  Family History  Problem Relation Age of Onset   Heart attack Mother        died @ 4.   Diabetes type II Father        died @ 64.   Breast cancer Sister        died @ 63.    Prior to Admission medications   Medication Sig Start Date End Date Taking? Authorizing Provider  acetaminophen (TYLENOL) 500 MG tablet Take 1,000 mg by mouth every 6 (six) hours as needed for moderate pain. Patient not taking: Reported on 06/26/2022    [provider]  amiodarone (PACERONE) 200 MG tablet Take 1 tablet (200 mg total) by mouth daily. 02/14/22  Rosita Fire, Brittainy M, PA-C  apixaban (ELIQUIS) 5 MG TABS tablet Take 1 tablet (5 mg total) by mouth 2 (two) times daily. 02/14/22   Lyda Jester M, PA-C  dapagliflozin propanediol (FARXIGA) 10 MG TABS tablet Take 10 mg by mouth daily.    [provider]  empagliflozin (JARDIANCE) 10 MG TABS tablet Take 1 tablet (10 mg total) by mouth daily. Patient not taking: Reported on 06/26/2022 02/14/22   Lyda Jester M, PA-C  furosemide (LASIX) 20 MG tablet Take 1 tablet (20 mg total) by mouth daily as needed for edema or fluid (as needed for weight increase 2 to 3 lbs in 24 hrs or 5 lbs in 7 days.). 02/14/22   Lyda Jester M, PA-C  lactose free nutrition (BOOST) LIQD  Take 237 mLs by mouth at bedtime.    [provider]  levothyroxine (SYNTHROID, LEVOTHROID) 88 MCG tablet Take 88 mcg by mouth daily before breakfast.    [provider]  metoprolol tartrate (LOPRESSOR) 25 MG tablet Take 1 tablet (25 mg total) by mouth 2 (two) times daily. 02/14/22   Consuelo Pandy, PA-C  Multiple Vitamin (MULTIVITAMIN WITH MINERALS) TABS tablet Take 1 tablet by mouth daily.    [provider]  MYRBETRIQ 50 MG TB24 tablet Take 50 mg by mouth daily. 10/03/21   [provider]  pantoprazole (PROTONIX) 40 MG tablet Take 40 mg by mouth 2 (two) times daily. 09/18/21   [provider]    Physical Exam: Vitals:   07/09/22 0149 07/09/22 0200 07/09/22 0210 07/09/22 0330  BP: (!) 160/91 110/62 (!) 104/57 120/67  Pulse:  68 63 (!) 56  Resp: (!) 25 (!) 25 (!) 21 (!) 21  Temp: (!) 97.4 F (36.3 C)     TempSrc: Axillary     SpO2:  91% 92% 93%  Weight:      Height:        Physical Exam Vitals reviewed.  Constitutional:      General: She is not in acute distress. HENT:     Head: Normocephalic and atraumatic.  Cardiovascular:     Rate and Rhythm: Regular rhythm. Bradycardia present.     Pulses: Normal pulses.  Pulmonary:     Effort: Pulmonary effort is normal. No respiratory distress.     Breath sounds: Rales present. No wheezing.  Abdominal:     General: Bowel sounds are normal. There is no distension.     Palpations: Abdomen is soft.     Tenderness: There is no abdominal tenderness.  Musculoskeletal:     Cervical back: Normal range of motion.     Right lower leg: No edema.     Left lower leg: No edema.  Skin:    General: Skin is warm and dry.  Neurological:     General: No focal deficit present.     Mental Status: She is alert and oriented to person, place, and time.     Sensory: No sensory deficit.     Motor: No weakness.     Labs on Admission: I have personally reviewed following labs and imaging  studies  CBC: Recent Labs  Lab 07/09/22 0156  WBC 7.4  NEUTROABS 4.3  HGB 15.4*  HCT 45.5  MCV 96.4  PLT [id number]   Basic Metabolic Panel: Recent Labs  Lab 07/09/22 0156  NA 134*  K 4.9  CL 101  CO2 24  GLUCOSE 163*  BUN 29*  CREATININE 1.11*  CALCIUM 8.5*   GFR: Estimated Creatinine Clearance:  30.9 mL/min (A) (by C-G formula based on SCr of 1.11 mg/dL (H)). Liver Function Tests: Recent Labs  Lab 07/09/22 0156  AST 44*  ALT 22  ALKPHOS 37*  BILITOT 1.3*  PROT 6.0*  ALBUMIN 3.1*   No results for input(s): "LIPASE", "AMYLASE" in the last 168 hours. No results for input(s): "AMMONIA" in the last 168 hours. Coagulation Profile: No results for input(s): "INR", "PROTIME" in the last 168 hours. Cardiac Enzymes: No results for input(s): "CKTOTAL", "CKMB", "CKMBINDEX", "TROPONINI" in the last 168 hours. BNP (last 3 results) No results for input(s): "PROBNP" in the last 8760 hours. HbA1C: No results for input(s): "HGBA1C" in the last 72 hours. CBG: No results for input(s): "GLUCAP" in the last 168 hours. Lipid Profile: No results for input(s): "CHOL", "HDL", "LDLCALC", "TRIG", "CHOLHDL", "LDLDIRECT" in the last 72 hours. Thyroid Function Tests: No results for input(s): "TSH", "T4TOTAL", "FREET4", "T3FREE", "THYROIDAB" in the last 72 hours. Anemia Panel: No results for input(s): "VITAMINB12", "FOLATE", "FERRITIN", "TIBC", "IRON", "RETICCTPCT" in the last 72 hours. Urine analysis:    Component Value Date/Time   COLORURINE YELLOW 02/14/2021 1815   APPEARANCEUR CLEAR 02/14/2021 1815   LABSPEC 1.011 02/14/2021 1815   PHURINE 5.0 02/14/2021 1815   GLUCOSEU NEGATIVE 02/14/2021 1815   HGBUR SMALL (A) 02/14/2021 1815   BILIRUBINUR NEGATIVE 02/14/2021 1815   KETONESUR NEGATIVE 02/14/2021 1815   PROTEINUR NEGATIVE 02/14/2021 1815   UROBILINOGEN 0.2 05/19/2014 0635   NITRITE NEGATIVE 02/14/2021 1815   LEUKOCYTESUR NEGATIVE 02/14/2021 1815    Radiological Exams on  Admission: DG Chest Portable 1 View  Result Date: 07/09/2022 CLINICAL DATA:  Shortness of breath. EXAM: PORTABLE CHEST 1 VIEW COMPARISON:  02/03/2022. FINDINGS: The heart is enlarged and the mediastinal contour is stable. There is a large hiatal hernia. Pulmonary vascular congestion is present. Atherosclerotic calcification of the aorta is noted. Patchy perihilar airspace disease is noted bilaterally. No effusion or pneumothorax. No acute osseous abnormality. IMPRESSION: 1. Cardiomegaly with pulmonary vascular congestion. 2. Patchy perihilar airspace disease bilaterally suggesting edema. Correlate clinically to exclude infection. 3. Large hiatal hernia. Electronically Signed   By: Brett Fairy M.D.   On: 07/09/2022 02:16    EKG: Independently reviewed.  Sinus rhythm with first-degree AV block, PVCs, diffuse T wave abnormalities.  PVCs new but otherwise no significant change since prior tracing from October 2023.  Assessment and Plan  Acute on chronic combined CHF Acute hypoxemic respiratory failure Oxygen saturation in the low 70s on room air initially, currently stable on BiPAP.  BNP 484.  Chest x-ray showing pulmonary edema. Echo done in August 2023 showing EF 40 to [id number], grade 1 diastolic dysfunction, trivial mitral valve regurgitation, moderately dilated left atrium, trivial aortic valve regurgitation.  Patient received IV Lasix 40 mg in the ED. Blood pressure currently soft, order additional doses of Lasix in the morning if blood pressure is able to tolerate.  Cardiac monitoring, monitor intake and output, daily weights, low-sodium diet with fluid restriction, and monitor renal function.  Repeat echocardiogram ordered.  Continuous pulse ox.  Continue BiPAP at this time, wean as tolerated.   Mild hypervolemic hyponatremia Continue diuresis and monitor labs.  Hypertension Blood pressure currently soft after IV Lasix.  Continue diuresis in the morning as blood pressure is able to tolerate.  Hold  metoprolol at this time.  Paroxysmal A-fib Current rhythm is sinus bradycardia with heart rate in the 50s.  Hold metoprolol at this time.  Continue Eliquis and amiodarone after pharmacy med rec is  done.  Continue cardiac monitoring.  Hypothyroidism Continue Synthroid after pharmacy med rec is done.  Check TSH.  DVT prophylaxis: Continue Eliquis after pharmacy med rec is done. Code Status: DNR/DNI (discussed with the patient) Family Communication: No family available at this time. Level of care: Progressive Care Unit Admission status: It is my clinical opinion that admission to INPATIENT is reasonable and necessary because of the expectation that this patient will require hospital care that crosses at least 2 midnights to treat this condition based on the medical complexity of the problems presented.  Given the aforementioned information, the predictability of an adverse outcome is felt to be significant.   Shela Leff MD [facility]  If 7PM-7AM, please contact night-coverage [email or web address]  07/09/2022, 3:52 AM

## 2022-07-09 NOTE — Progress Notes (Signed)
OT Cancellation Note  Patient Details Name: Ellen Chandler MRN: [id number] DOB: [birth year]   Cancelled Treatment:    Reason Eval/Treat Not Completed: Other (comment) (Pt declined upon entry, provided encouragement and education, pt continued to politely decline. OT to f/u tomorrow)  Elliot Cousin 07/09/2022, 2:49 PM

## 2022-07-09 NOTE — Progress Notes (Signed)
RT NOTE:  Pt switched from BIPAP to 4L [place]. Pt tolerating well at this time. No distress. SpO2 95%.

## 2022-07-09 NOTE — Consult Note (Addendum)
Cardiology Consultation   Ellen ID: Ellen Chandler MRN: [id number]; DOB: 01-22-1931  Admit date: 07/09/2022 Date of Consult: 07/09/2022  PCP:  Deland Pretty, [place] [facility] Cardiologist:  Mertie Moores, MD      Ellen Profile:   Ellen Chandler is a 89 y.o. female with a hx of chronic combined systolic and diastolic CHF, HTN, paroxysmal atrial fibrillation, GERD, hypothyroidism who is being seen 07/09/2022 for the evaluation of CHF at the request of Dr. Manuella Ghazi.  History of Present Illness:   Ellen Chandler is an 89 year old female with above medical history who is followed by Dr. Acie Fredrickson.  Per chart review, Ellen had a stress test back in 2000 secondary to abnormal EKG.  Stress test was reportedly normal.  She was later seen by Dr. Meda Coffee in 2014 while admitted to the hospital for treatment of acute respiratory failure.  Echocardiogram on 05/05/2013 showed EF 60-65%, no regional wall motion abnormalities, grade 1 diastolic dysfunction, mild mitral valve regurgitation.  Ellen was lost to follow-up.  She later reestablished care with cardiology during admission in 12/24/2021 - 01/24/2022.  At that time, Ellen presented complaining of worsening dyspnea and was found to have an acute heart failure exacerbation, pneumonia, and newly diagnosed atrial fibrillation with RVR.  Echocardiogram on 01/04/2022 showed EF 40-45%, no regional wall motion abnormalities, grade 1 diastolic dysfunction, normal RV systolic function.  Underwent left/right heart catheterization on 01/09/2022 that showed nonobstructive CAD.  Filling pressures were low on right heart catheterization after she was diuresed.  For treatment of A-fib, Ellen was initially started on diltiazem but Ellen was stopped due to hypotension.  She was loaded with amiodarone for 24 hours but declined further amiodarone treatment.  She was eventually placed on metoprolol and converted to normal sinus rhythm.  She was also started on  Eliquis.  Ellen Chandler presented to the hospital on 02/03/2022 complaining of progressive dyspnea after she admittedly ate a lot of salty foods.[phone]. BNP 884. CXR with mild pulmonary edema.  Ellen was treated with IV Lasix.  While admitted, Ellen was noted to develop atrial fibrillation with episodes of RVR.  Initially started on diltiazem, but Ellen was stopped due to hypotension.  She was treated with amiodarone bolus plus gtt. and converted to normal sinus rhythm.  She was discharged on p.o. amiodarone.  Ellen was last seen by cardiology on 06/26/2022.  At that time, Ellen was doing well.  Ellen was brought in by EMS from [place] on 3/4 due to shortness of breath for the past 3 hours, oxygen saturations in the low 70s.  BNP elevated to 484.1.  High sensitive troponin 8, 10.  Potassium 4.9, creatinine 1.11, WBC 7.4, hemoglobin 15.4, platelets 322.  COVID, flu, RSV negative.  Chest x-ray showed cardiomegaly with pulmonary vascular congestion, patchy perihilar airspace disease bilaterally suggestive of edema.  Ellen was started on BiPAP in the ED and was admitted to the internal medicine service.  She has received 2 doses of IV Lasix 40 mg.  Cardiology asked to consult.  Past Medical History:  Diagnosis Date   Abnormal ECG    a. Pt aware of long hx of abnl ecg->h/o normal stress testing in [place] ~ 2000.   Diastolic dysfunction    a. 04/2013 Echo: EF 60-65%, No rwma, Gr 1 DD, mild MR.   GERD (gastroesophageal reflux disease)    Hypertension    Hypothyroidism     Past Surgical History:  Procedure Laterality Date  ABDOMINAL HYSTERECTOMY     JOINT REPLACEMENT     a. knee left - injury resulted after being attacked by a dog.   LEFT HEART CATH AND CORONARY ANGIOGRAPHY N/A 01/05/2022   Procedure: LEFT HEART CATH AND CORONARY ANGIOGRAPHY;  Surgeon: Belva Crome, MD;  Location: [facility];  Service: Cardiovascular;  Laterality: N/A;    RIGHT/LEFT HEART CATH AND CORONARY ANGIOGRAPHY N/A 01/09/2022   Procedure: RIGHT/LEFT HEART CATH AND CORONARY ANGIOGRAPHY;  Surgeon: Early Osmond, MD;  Location: [place] [facility];  Service: Cardiovascular;  Laterality: N/A;     Home Medications:  Prior to Admission medications   Medication Sig Start Date End Date Taking? Authorizing Provider  acetaminophen (TYLENOL) 500 MG tablet Take 1,000 mg by mouth every 6 (six) hours as needed for moderate pain.   Yes [provider]  amiodarone (PACERONE) 200 MG tablet Take 1 tablet (200 mg total) by mouth daily. 02/14/22  Yes Lyda Jester M, PA-C  apixaban (ELIQUIS) 5 MG TABS tablet Take 1 tablet (5 mg total) by mouth 2 (two) times daily. 02/14/22  Yes Lyda Jester M, PA-C  dapagliflozin propanediol (FARXIGA) 10 MG TABS tablet Take 10 mg by mouth daily.   Yes [provider]  furosemide (LASIX) 20 MG tablet Take 1 tablet (20 mg total) by mouth daily as needed for edema or fluid (as needed for weight increase 2 to 3 lbs in 24 hrs or 5 lbs in 7 days.). 02/14/22  Yes Simmons, Brittainy M, PA-C  lactose free nutrition (BOOST) LIQD Take 237 mLs by mouth at bedtime.   Yes [provider]  levothyroxine (SYNTHROID, LEVOTHROID) 88 MCG tablet Take 88 mcg by mouth daily before breakfast.   Yes [provider]  metoprolol tartrate (LOPRESSOR) 25 MG tablet Take 1 tablet (25 mg total) by mouth 2 (two) times daily. 02/14/22  Yes Lyda Jester M, PA-C  Multiple Vitamin (MULTIVITAMIN WITH MINERALS) TABS tablet Take 1 tablet by mouth daily.   Yes [provider]  MYRBETRIQ 50 MG TB24 tablet Take 50 mg by mouth daily. 10/03/21  Yes [provider]  pantoprazole (PROTONIX) 40 MG tablet Take 40 mg by mouth 2 (two) times daily. 09/18/21  Yes [provider]    Inpatient Medications: Scheduled Meds:  amiodarone  200 mg Oral Daily   apixaban  5 mg Oral BID   dapagliflozin propanediol  10 mg  Oral Daily   levothyroxine  88 mcg Oral QAC breakfast   lidocaine  1 patch Transdermal Q24H   mirabegron ER  50 mg Oral Daily   ondansetron (ZOFRAN) IV  4 mg Intravenous Once   pantoprazole  40 mg Oral BID   Continuous Infusions:  PRN Meds: acetaminophen **OR** acetaminophen, mouth rinse  Allergies:   No Known Allergies  Social History:   Social History   Socioeconomic History   Marital status: Widowed    Spouse name: Not on file   Number of children: 4   Years of education: Not on file   Highest education level: High school graduate  Occupational History   Occupation: Retired  Tobacco Use   Smoking status: Never   Smokeless tobacco: Never  Vaping Use   Vaping Use: Never used  Substance and Sexual Activity   Alcohol use: Never   Drug use: No   Sexual activity: Not on file  Other Topics Concern   Not on file  Social History Narrative   Moved to [place] from [place] in  June 2014.  Lives at [facility].  Was exercising daily.   Social Determinants of Health   Financial Resource Strain: Low Risk  (02/06/2022)   Overall Financial Resource Strain (CARDIA)    Difficulty of Paying Living Expenses: Not very hard  Food Insecurity: No Food Insecurity (07/09/2022)   Hunger Vital Sign    Worried About Running Out of Food in the Last Year: Never true    Ran Out of Food in the Last Year: Never true  Transportation Needs: No Transportation Needs (07/09/2022)   PRAPARE - Hydrologist (Medical): No    Lack of Transportation (Non-Medical): No  Physical Activity: Not on file  Stress: Not on file  Social Connections: Not on file  Intimate Partner Violence: Not At Risk (07/09/2022)   Humiliation, Afraid, Rape, and Kick questionnaire    Fear of Current or Ex-Partner: No    Emotionally Abused: No    Physically Abused: No    Sexually Abused: No    Family History:    Family History  Problem Relation Age of Onset   Heart attack  Mother        died @ 47.   Diabetes type II Father        died @ 51.   Breast cancer Sister        died @ 76.     ROS:  Please see the history of present illness.   All other ROS reviewed and negative.     Physical Exam/Data:   Vitals:   07/09/22 0607 07/09/22 0824 07/09/22 0932 07/09/22 1055  BP:  121/63  111/65  Pulse:  65 64 63  Resp:  '17 16 20  '$ Temp:  98.1 F (36.7 C)  98.5 F (36.9 C)  TempSrc:  Oral  Oral  SpO2: 96% 95% 92% 93%  Weight:      Height:        Intake/Output Summary (Last 24 hours) at 07/09/2022 1328 Last data filed at 07/09/2022 1223 Gross per 24 hour  Intake 960 ml  Output 1700 ml  Net -740 ml      07/09/2022    5:51 AM 07/09/2022    1:45 AM 06/26/2022   11:33 AM  Last 3 Weights  Weight (lbs) 138 lb 10.7 oz 140 lb 141 lb 6.4 oz  Weight (kg) 62.9 kg 63.504 kg 64.139 kg     Body mass index is 23.08 kg/m.  General: Elderly female, laying flat in the bed with head elevated. Wearing [place], no acute distress  HEENT: normal Neck: no JVD Vascular: Radial pulses 2+ bilaterally Cardiac:  normal S1, S2; RRR; no murmur Lungs:  crackles in bilateral lung bases, otherwise clear to auscultation. Normal WOB on 2L via [place]  Abd: soft, nontender  Ext: no edema in BLE Musculoskeletal:  No deformities, BUE and BLE strength normal and equal Skin: warm and dry  Neuro:  CNs 2-12 intact, no focal abnormalities noted Psych:  Normal affect   EKG:  The EKG was personally reviewed and demonstrates:  Sinus rhythm with PVCs, HR 67 BPM Telemetry:  Telemetry was personally reviewed and demonstrates:  Sinus rhythm with occasional PVCs, HR in the 60s   Relevant CV Studies:  Echocardiogram 01/04/22  1. Left ventricular ejection fraction, by estimation, is 40 to 45%. The  left ventricle has mildly decreased function. The left ventricle has no  regional wall motion abnormalities. Left ventricular diastolic parameters  are consistent with Grade I  diastolic dysfunction (impaired  relaxation). Elevated left ventricular  end-diastolic pressure.   2. Right ventricular systolic function is normal. The right ventricular  size is normal. There is normal pulmonary artery systolic pressure.   3. Left atrial size was moderately dilated.   4. The mitral valve is normal in structure. Trivial mitral valve  regurgitation. No evidence of mitral stenosis.   5. The aortic valve is tricuspid. There is mild calcification of the  aortic valve. There is mild thickening of the aortic valve. Aortic valve  regurgitation is trivial. No aortic stenosis is present.   6. The inferior vena cava is normal in size with greater than 50%  respiratory variability, suggesting right atrial pressure of 3 mmHg.   Laboratory Data:  High Sensitivity Troponin:   Recent Labs  Lab 07/09/22 0156 07/09/22 0355  TROPONINIHS 8 10     Chemistry Recent Labs  Lab 07/09/22 0156 07/09/22 0517  NA 134* 136  K 4.9 4.7  CL 101 101  CO2 24 27  GLUCOSE 163* 112*  BUN 29* 28*  CREATININE 1.11* 1.19*  CALCIUM 8.5* 8.6*  GFRNONAA 48* 44*  ANIONGAP 9 8    Recent Labs  Lab 07/09/22 0156 07/09/22 0517  PROT 6.0* 6.0*  ALBUMIN 3.1* 3.2*  AST 44* 34  ALT 22 22  ALKPHOS 37* 33*  BILITOT 1.3* 0.6   Lipids No results for input(s): "CHOL", "TRIG", "HDL", "LABVLDL", "LDLCALC", "CHOLHDL" in the last 168 hours.  Hematology Recent Labs  Lab 07/09/22 0156 07/09/22 0517  WBC 7.4 10.6*  RBC 4.72 4.65  HGB 15.4* 15.2*  HCT 45.5 44.5  MCV 96.4 95.7  MCH 32.6 32.7  MCHC 33.8 34.2  RDW 13.8 13.8  PLT 322 305   Thyroid  Recent Labs  Lab 07/09/22 0517  TSH 3.110    BNP Recent Labs  Lab 07/09/22 0156  BNP 484.1*    DDimer No results for input(s): "DDIMER" in the last 168 hours.   Radiology/Studies:  DG Chest Portable 1 View  Result Date: 07/09/2022 CLINICAL DATA:  Shortness of breath. EXAM: PORTABLE CHEST 1 VIEW COMPARISON:  02/03/2022. FINDINGS: The heart is enlarged and the mediastinal  contour is stable. There is a large hiatal hernia. Pulmonary vascular congestion is present. Atherosclerotic calcification of the aorta is noted. Patchy perihilar airspace disease is noted bilaterally. No effusion or pneumothorax. No acute osseous abnormality. IMPRESSION: 1. Cardiomegaly with pulmonary vascular congestion. 2. Patchy perihilar airspace disease bilaterally suggesting edema. Correlate clinically to exclude infection. 3. Large hiatal hernia. Electronically Signed   By: Brett Fairy M.D.   On: 07/09/2022 02:16     Assessment and Plan:   Acute on chronic combined systolic and diastolic heart failure - Most recent echocardiogram from 12/2021 showed EF 40-45%, no regional wall motion abnormalities, grade 1 diastolic dysfunction, normal RV systolic function.  Cardiac catheterization in 01/2022 showed nonobstructive CAD - Ellen presented complaining of shortness of breath that had been ongoing for the past 3 hours.  Oxygen saturations initially in the low 70s.  She was started on BiPAP in the ED.  BNP elevated to 484.  Chest x-ray with pulmonary edema.  -So far, Ellen has received 2 doses of IV Lasix 40 mg.  Output 1.35 L urine overnight.  Renal function stable. Now on 2L supplemental oxygen via [place]  - Continue IV lasix 40 mg daily  - Continue farxiga 10 mg daily  - Metoprolol was held low BP, bradycardia (HR in the 50s when Ellen  was sleeping).  BP and HR improved, resume metoprolol tartrate at 12.5 mg BID  - Further GDMT limited by low BP. May be able to try low dose spironolactone when off IV diuretics - No need to repeat echocardiogram   Paroxysmal atrial fibrillation - Ellen is maintaining NSR  - Resume metoprolol tartrate 12.5 mg BID  - Continue amiodarone 200 mg daily  - Continue Eliquis 5 mg twice daily. Ellen is the correct dose for her right now, but if weight decreases with diuresis, may need to be on 2.5 mg BID   Hypertension - BP well controlled    Risk  Assessment/Risk Scores:   New York Heart Association (NYHA) Functional Class NYHA Class IV  CHA2DS2-VASc Score = 5   Ellen indicates a 7.2% annual risk of stroke. The Ellen's score is based upon: CHF History: 1 HTN History: 1 Diabetes History: 0 Stroke History: 0 Vascular Disease History: 0 Age Score: 2 Gender Score: 1     For questions or updates, please contact [place] Please consult [email or web address] for contact info under    Signed, Margie Billet, PA-C  07/09/2022 1:28 PM   Ellen seen and examined. Agree with assessment and plan.  Ellen Chandler is a very pleasant 89 year old female who resides at [facility] dependent living.  She has a history of remote episode of respiratory failure.  An echo Doppler study in December 2014 showed EF 60 to 65% with grade 1 diastolic dysfunction and mild MR.  In August 2023 she was admitted with acute heart failure exacerbation, pneumonia and A-fib with RVR.  Echo at that time showed EF 40 to 45% without regional wall motion abnormalities and grade 1 diastolic dysfunction.  She was found nonobstructive CAD at cardiac catheterization and right heart filling pressures were low after she was diuresed.  For treatment of her A-fib she initially was started on diltiazem but Ellen was discontinued secondary to hypotension and treated with amiodarone.  At that time she also was started on liquids for anticoagulation.  Her most recent admission was in September 2023 when again she developed increasing shortness of breath and was found to have mild pulmonary edema with elevated BNP at 884.  She again developed an episode of A-fib, treated with amiodarone bolus plus infusion and converted to sinus rhythm.  She is followed by Dr. Mertie Moores in our office and last saw him June 26, 2022 at which time he was felt to be hemodynamically stable.  She was admitted during the night with increasing shortness of breath associated wheezing with  oxygen desaturation down to low 70s and BNP elevation at 484.  Chest x-ray confirmed pulmonary vascular congestion.  She was treated with BiPAP in the emergency room with improvement in symptomatology.  Subsequently, she has received Lasix 40 mg x 2.  Presently she is breathing much better.  Net urine output is -740 cc.  HEENT is unremarkable.  There is no significant jugular venous distention.  There is resolution of her prior wheezing.  Rhythm is regular in the 0000000 with 1/6 systolic murmur.  Abdomen is soft nontender.  There is no significant lower extremity edema.  She has been maintaining sinus rhythm amiodarone 200 mg daily.  Has been on Farxiga 10 mg daily.  Ventricular rate is in the 60s and will resume metoprolol to tartrate at 12.5 mg twice a day.  If blood pressure remains stable, she may benefit from the addition of spironolactone initially at 12.5 mg every other day.  Will need to monitor weight and if weight drops significantly below 60 kg with her age and weight reduction she may require reduction of Eliquis to 2.5 mg twice a day.   Troy Sine, MD, [facility] 07/09/2022 2:57 PM

## 2022-07-09 NOTE — Progress Notes (Signed)
PT Cancellation Note  Patient Details Name: Ellen Chandler MRN: [id number] DOB: January 15, 1931   Cancelled Treatment:    Reason Eval/Treat Not Completed: Patient declined, no reason specified;Fatigue/lethargy limiting ability to participate. Pt reports fatigue, refuses any mobility or PT evaluation at this time. PT will attempt to follow up as time allows.   Zenaida Niece 07/09/2022, 2:40 PM

## 2022-07-09 NOTE — Progress Notes (Signed)
  Echocardiogram 2D Echocardiogram has been performed.  Ellen Chandler 07/09/2022, 4:25 PM

## 2022-07-09 NOTE — Progress Notes (Addendum)
PROGRESS NOTE    Ellen Chandler  [id number] DOB: 1930-09-04 DOA: 07/09/2022 PCP: Deland Pretty, MD   Brief Narrative:   Ellen Chandler is a 89 y.o. female with medical history significant of chronic combined CHF, hypertension, hypothyroidism, paroxysmal A-fib on Eliquis presented to ED via EMS for evaluation of shortness of breath.  Oxygen saturation in the low 70s on room air and was placed on BiPAP with improvement of oxygen saturation to 90s.  She has now been weaned to nasal cannula oxygen.  Appreciate cardiology evaluation.  Assessment & Plan:   Principal Problem:   Acute on chronic combined systolic (congestive) and diastolic (congestive) heart failure (HCC) Active Problems:   Essential hypertension   Hypothyroidism   Hyponatremia   Acute hypoxemic respiratory failure (HCC)   Paroxysmal A-fib (HCC)  Assessment and Plan:  Acute on chronic combined CHF Acute hypoxemic respiratory failure Oxygen saturation in the low 70s on room air initially, currently stable on BiPAP.  BNP 484.  Chest x-ray showing pulmonary edema. Echo done in August 2023 showing EF 40 to [id number], grade 1 diastolic dysfunction, trivial mitral valve regurgitation, moderately dilated left atrium, trivial aortic valve regurgitation.  Patient received IV Lasix 40 mg in the ED. Blood pressure currently soft, order additional doses of Lasix in the morning if blood pressure is able to tolerate.  Cardiac monitoring, monitor intake and output, daily weights, low-sodium diet with fluid restriction, and monitor renal function.  Repeat echocardiogram ordered.  Continuous pulse ox.  Continue BiPAP at this time, wean as tolerated.  -Plan to give additional dose of IV Lasix 40 mg 3/4   Mild hypervolemic hyponatremia-resolved Continue diuresis and monitor labs.   Hypertension Continue diuresis and continue to hold metoprolol for heart rate less than 60   Paroxysmal A-fib Current rhythm is sinus bradycardia with heart  rate in the 50s.  Hold metoprolol at this time.  Resume Eliquis and amiodarone   Hypothyroidism Continue Synthroid after pharmacy med rec is done.  TSH 3.1.    DVT prophylaxis: Eliquis Code Status: DNR Family Communication: None at bedside Disposition Plan:  Status is: Inpatient Remains inpatient appropriate because: Need for IV medications   Consultants:  Cardiology  Procedures:  None  Antimicrobials:  None   Subjective: Patient seen and evaluated today with no new acute complaints or concerns. No acute concerns or events noted overnight.  She continues to have some ongoing shortness of breath.  Objective: Vitals:   07/09/22 0500 07/09/22 0551 07/09/22 0607 07/09/22 0824  BP: (!) 127/55 124/67  121/63  Pulse: (!) 58 66  65  Resp: (!) '24 20  17  '$ Temp:  97.7 F (36.5 C)  98.1 F (36.7 C)  TempSrc:  Oral  Oral  SpO2: 93% 99% 96% 95%  Weight:  62.9 kg    Height:  '5\' 5"'$  (1.651 m)      Intake/Output Summary (Last 24 hours) at 07/09/2022 0930 Last data filed at 07/09/2022 [id number] Gross per 24 hour  Intake 720 ml  Output 1350 ml  Net -630 ml   Filed Weights   07/09/22 0145 07/09/22 0551  Weight: 63.5 kg 62.9 kg    Examination:  General exam: Appears calm and comfortable  Respiratory system: Clear to auscultation. Respiratory effort normal.  5 L nasal cannula Cardiovascular system: S1 & S2 heard, RRR.  Gastrointestinal system: Abdomen is soft Central nervous system: Alert and awake Extremities: No edema Skin: No significant lesions noted Psychiatry: Flat affect.    Data  Reviewed: I have personally reviewed following labs and imaging studies  CBC: Recent Labs  Lab 07/09/22 0156 07/09/22 0517  WBC 7.4 10.6*  NEUTROABS 4.3  --   HGB 15.4* 15.2*  HCT 45.5 44.5  MCV 96.4 95.7  PLT 322 [id number]   Basic Metabolic Panel: Recent Labs  Lab 07/09/22 0156 07/09/22 0517  NA 134* 136  K 4.9 4.7  CL 101 101  CO2 24 27  GLUCOSE 163* 112*  BUN 29* 28*   CREATININE 1.11* 1.19*  CALCIUM 8.5* 8.6*   GFR: Estimated Creatinine Clearance: 28.8 mL/min (A) (by C-G formula based on SCr of 1.19 mg/dL (H)). Liver Function Tests: Recent Labs  Lab 07/09/22 0156 07/09/22 0517  AST 44* 34  ALT 22 22  ALKPHOS 37* 33*  BILITOT 1.3* 0.6  PROT 6.0* 6.0*  ALBUMIN 3.1* 3.2*   No results for input(s): "LIPASE", "AMYLASE" in the last 168 hours. No results for input(s): "AMMONIA" in the last 168 hours. Coagulation Profile: No results for input(s): "INR", "PROTIME" in the last 168 hours. Cardiac Enzymes: No results for input(s): "CKTOTAL", "CKMB", "CKMBINDEX", "TROPONINI" in the last 168 hours. BNP (last 3 results) No results for input(s): "PROBNP" in the last 8760 hours. HbA1C: No results for input(s): "HGBA1C" in the last 72 hours. CBG: No results for input(s): "GLUCAP" in the last 168 hours. Lipid Profile: No results for input(s): "CHOL", "HDL", "LDLCALC", "TRIG", "CHOLHDL", "LDLDIRECT" in the last 72 hours. Thyroid Function Tests: Recent Labs    07/09/22 0517  TSH 3.110   Anemia Panel: No results for input(s): "VITAMINB12", "FOLATE", "FERRITIN", "TIBC", "IRON", "RETICCTPCT" in the last 72 hours. Sepsis Labs: No results for input(s): "PROCALCITON", "LATICACIDVEN" in the last 168 hours.  Recent Results (from the past 240 hour(s))  Resp panel by RT-PCR (RSV, Flu A&B, Covid) Anterior Nasal Swab     Status: None   Collection Time: 07/09/22  3:47 AM   Specimen: Anterior Nasal Swab  Result Value Ref Range Status   SARS Coronavirus 2 by RT PCR NEGATIVE NEGATIVE Final   Influenza A by PCR NEGATIVE NEGATIVE Final   Influenza B by PCR NEGATIVE NEGATIVE Final    Comment: (NOTE) The Xpert Xpress SARS-CoV-2/FLU/RSV plus assay is intended as an aid in the diagnosis of influenza from Nasopharyngeal swab specimens and should not be used as a sole basis for treatment. Nasal washings and aspirates are unacceptable for Xpert Xpress  SARS-CoV-2/FLU/RSV testing.  Fact Sheet for Patients: EntrepreneurPulse.com.au  Fact Sheet for Healthcare Providers: IncredibleEmployment.be  This test is not yet approved or cleared by the [place] FDA and has been authorized for detection and/or diagnosis of SARS-CoV-2 by FDA under an Emergency Use Authorization (EUA). This EUA will remain in effect (meaning this test can be used) for the duration of the COVID-19 declaration under Section 564(b)(1) of the Act, 21 U.S.C. section 360bbb-3(b)(1), unless the authorization is terminated or revoked.     Resp Syncytial Virus by PCR NEGATIVE NEGATIVE Final    Comment: (NOTE) Fact Sheet for Patients: EntrepreneurPulse.com.au  Fact Sheet for Healthcare Providers: IncredibleEmployment.be  This test is not yet approved or cleared by the [place] FDA and has been authorized for detection and/or diagnosis of SARS-CoV-2 by FDA under an Emergency Use Authorization (EUA). This EUA will remain in effect (meaning this test can be used) for the duration of the COVID-19 declaration under Section 564(b)(1) of the Act, 21 U.S.C. section 360bbb-3(b)(1), unless the authorization is terminated or revoked.  Performed at [facility], [place] [address] [place], [place] [address]          Radiology Studies: DG Chest Portable 1 View  Result Date: 07/09/2022 CLINICAL DATA:  Shortness of breath. EXAM: PORTABLE CHEST 1 VIEW COMPARISON:  02/03/2022. FINDINGS: The heart is enlarged and the mediastinal contour is stable. There is a large hiatal hernia. Pulmonary vascular congestion is present. Atherosclerotic calcification of the aorta is noted. Patchy perihilar airspace disease is noted bilaterally. No effusion or pneumothorax. No acute osseous abnormality. IMPRESSION: 1. Cardiomegaly with pulmonary vascular congestion. 2. Patchy perihilar airspace disease bilaterally  suggesting edema. Correlate clinically to exclude infection. 3. Large hiatal hernia. Electronically Signed   By: Brett Fairy M.D.   On: 07/09/2022 02:16        Scheduled Meds:  amiodarone  200 mg Oral Daily   apixaban  5 mg Oral BID   dapagliflozin propanediol  10 mg Oral Daily   levothyroxine  88 mcg Oral QAC breakfast   lidocaine  1 patch Transdermal Q24H   mirabegron ER  50 mg Oral Daily   ondansetron (ZOFRAN) IV  4 mg Intravenous Once   pantoprazole  40 mg Oral BID     LOS: 0 days    Time spent: 35 minutes    [place]on [place]rak[place] Darleen Crocker, DO [facility]  If 7PM-7AM, please contact night-coverage [email or web address] 07/09/2022, 9:30 AM

## 2022-07-09 NOTE — ED Triage Notes (Signed)
BIB EMS from [facility] due to SOB x3 hours sating in low 70s. Audible rales for EMS. Hx CHF. Wheezing in RLL gave duoneb. 20 L AC. CPAP placed. Sats up to 97% on bipap.

## 2022-07-09 NOTE — ED Provider Notes (Signed)
[facility] Provider Note   CSN: [place]:[id number] Arrival date & time: 07/09/22  0139     History  Chief Complaint  Patient presents with   Shortness of Breath    Ellen Chandler is a 89 y.o. female.  The history is provided by the EMS personnel. The history is limited by the condition of the patient.  Shortness of Breath Severity:  Severe Onset quality:  Sudden Duration: hours. Timing:  Constant Progression:  Unchanged Chronicity:  New Context: not URI   Relieved by:  Nothing Worsened by:  Nothing Ineffective treatments:  None tried Associated symptoms: wheezing   Risk factors: no recent alcohol use   Patient with diastolic dysfunction presents with sudden     Past Medical History:  Diagnosis Date   Abnormal ECG    a. Pt aware of long hx of abnl ecg->h/o normal stress testing in [place] ~ 2000.   Diastolic dysfunction    a. 04/2013 Echo: EF 60-65%, No rwma, Gr 1 DD, mild MR.   GERD (gastroesophageal reflux disease)    Hypertension    Hypothyroidism      Home Medications Prior to Admission medications   Medication Sig Start Date End Date Taking? Authorizing Provider  acetaminophen (TYLENOL) 500 MG tablet Take 1,000 mg by mouth every 6 (six) hours as needed for moderate pain. Patient not taking: Reported on 06/26/2022    [provider]  amiodarone (PACERONE) 200 MG tablet Take 1 tablet (200 mg total) by mouth daily. 02/14/22   Lyda Jester M, PA-C  apixaban (ELIQUIS) 5 MG TABS tablet Take 1 tablet (5 mg total) by mouth 2 (two) times daily. 02/14/22   Lyda Jester M, PA-C  dapagliflozin propanediol (FARXIGA) 10 MG TABS tablet Take 10 mg by mouth daily.    [provider]  empagliflozin (JARDIANCE) 10 MG TABS tablet Take 1 tablet (10 mg total) by mouth daily. Patient not taking: Reported on 06/26/2022 02/14/22   Lyda Jester M, PA-C  furosemide (LASIX) 20 MG tablet Take 1 tablet (20  mg total) by mouth daily as needed for edema or fluid (as needed for weight increase 2 to 3 lbs in 24 hrs or 5 lbs in 7 days.). 02/14/22   Lyda Jester M, PA-C  lactose free nutrition (BOOST) LIQD Take 237 mLs by mouth at bedtime.    [provider]  levothyroxine (SYNTHROID, LEVOTHROID) 88 MCG tablet Take 88 mcg by mouth daily before breakfast.    [provider]  metoprolol tartrate (LOPRESSOR) 25 MG tablet Take 1 tablet (25 mg total) by mouth 2 (two) times daily. 02/14/22   Consuelo Pandy, PA-C  Multiple Vitamin (MULTIVITAMIN WITH MINERALS) TABS tablet Take 1 tablet by mouth daily.    [provider]  MYRBETRIQ 50 MG TB24 tablet Take 50 mg by mouth daily. 10/03/21   [provider]  pantoprazole (PROTONIX) 40 MG tablet Take 40 mg by mouth 2 (two) times daily. 09/18/21   [provider]      Allergies    Patient has no known allergies.    Review of Systems   Review of Systems  Unable to perform ROS: Acuity of condition  Respiratory:  Positive for shortness of breath and wheezing.     Physical Exam Updated Vital Signs BP (!) 104/57   Pulse 63   Temp (!) 97.4 F (36.3 C) (Axillary)   Resp (!) 21   Ht '5\' 5"'$  (1.651 m)  Wt 63.5 kg   SpO2 92%   BMI 23.30 kg/m  Physical Exam Vitals and nursing note reviewed. Exam conducted with a chaperone present.  Constitutional:      General: She is not in acute distress.    Appearance: She is well-developed.  HENT:     Head: Normocephalic and atraumatic.     Nose: Nose normal.  Eyes:     Pupils: Pupils are equal, round, and reactive to light.  Cardiovascular:     Rate and Rhythm: Normal rate and regular rhythm.     Pulses: Normal pulses.     Heart sounds: Normal heart sounds.  Pulmonary:     Effort: Pulmonary effort is normal. No respiratory distress.     Breath sounds: Wheezing and rales present.  Abdominal:     General: Bowel sounds are normal. There is no distension.      Palpations: Abdomen is soft.     Tenderness: There is no abdominal tenderness. There is no guarding or rebound.  Genitourinary:    Vagina: No vaginal discharge.  Musculoskeletal:        General: Normal range of motion.     Cervical back: Normal range of motion and neck supple.     Right lower leg: Edema present.     Left lower leg: Edema present.  Skin:    General: Skin is dry.     Capillary Refill: Capillary refill takes less than 2 seconds.     Findings: No erythema or rash.  Neurological:     General: No focal deficit present.     Deep Tendon Reflexes: Reflexes normal.  Psychiatric:        Mood and Affect: Mood normal.     ED Results / Procedures / Treatments   Labs (all labs ordered are listed, but only abnormal results are displayed) Results for orders placed or performed during the hospital encounter of 07/09/22  CBC with Differential  Result Value Ref Range   WBC 7.4 4.0 - 10.5 K/uL   RBC 4.72 3.87 - 5.11 MIL/uL   Hemoglobin 15.4 (H) 12.0 - 15.0 g/dL   HCT 45.5 36.0 - 46.0 %   MCV 96.4 80.0 - 100.0 fL   MCH 32.6 26.0 - 34.0 pg   MCHC 33.8 30.0 - 36.0 g/dL   RDW 13.8 11.5 - 15.5 %   Platelets 322 150 - 400 K/uL   nRBC 0.0 0.0 - 0.2 %   Neutrophils Relative % 56 %   Neutro Abs 4.3 1.7 - 7.7 K/uL   Lymphocytes Relative 27 %   Lymphs Abs 2.0 0.7 - 4.0 K/uL   Monocytes Relative 11 %   Monocytes Absolute 0.8 0.1 - 1.0 K/uL   Eosinophils Relative 4 %   Eosinophils Absolute 0.3 0.0 - 0.5 K/uL   Basophils Relative 1 %   Basophils Absolute 0.0 0.0 - 0.1 K/uL   Immature Granulocytes 1 %   Abs Immature Granulocytes 0.06 0.00 - 0.07 K/uL  Comprehensive metabolic panel  Result Value Ref Range   Sodium 134 (L) 135 - 145 mmol/L   Potassium 4.9 3.5 - 5.1 mmol/L   Chloride 101 98 - 111 mmol/L   CO2 24 22 - 32 mmol/L   Glucose, Bld 163 (H) 70 - 99 mg/dL   BUN 29 (H) 8 - 23 mg/dL   Creatinine, Ser 1.11 (H) 0.44 - 1.00 mg/dL   Calcium 8.5 (L) 8.9 - 10.3 mg/dL   Total  Protein 6.0 (L) 6.5 - 8.1  g/dL   Albumin 3.1 (L) 3.5 - 5.0 g/dL   AST 44 (H) 15 - 41 U/L   ALT 22 0 - 44 U/L   Alkaline Phosphatase 37 (L) 38 - 126 U/L   Total Bilirubin 1.3 (H) 0.3 - 1.2 mg/dL   GFR, Estimated 48 (L) >60 mL/min   Anion gap 9 5 - 15  Brain natriuretic peptide  Result Value Ref Range   B Natriuretic Peptide 484.1 (H) 0.0 - 100.0 pg/mL  Troponin I (High Sensitivity)  Result Value Ref Range   Troponin I (High Sensitivity) 8 <18 ng/L   DG Chest Portable 1 View  Result Date: 07/09/2022 CLINICAL DATA:  Shortness of breath. EXAM: PORTABLE CHEST 1 VIEW COMPARISON:  02/03/2022. FINDINGS: The heart is enlarged and the mediastinal contour is stable. There is a large hiatal hernia. Pulmonary vascular congestion is present. Atherosclerotic calcification of the aorta is noted. Patchy perihilar airspace disease is noted bilaterally. No effusion or pneumothorax. No acute osseous abnormality. IMPRESSION: 1. Cardiomegaly with pulmonary vascular congestion. 2. Patchy perihilar airspace disease bilaterally suggesting edema. Correlate clinically to exclude infection. 3. Large hiatal hernia. Electronically Signed   By: Brett Fairy M.D.   On: 07/09/2022 02:16    EKG EKG Interpretation  Date/Time:  Monday July 09 2022 02:04:06 EST Ventricular Rate:  66 PR Interval:  226 QRS Duration: 97 QT Interval:  424 QTC Calculation: 445 R Axis:   -13 Text Interpretation: Sinus rhythm Multiple ventricular premature complexes Prolonged PR interval Confirmed by Dory Horn) on 07/09/2022 2:48:34 AM  Radiology DG Chest Portable 1 View  Result Date: 07/09/2022 CLINICAL DATA:  Shortness of breath. EXAM: PORTABLE CHEST 1 VIEW COMPARISON:  02/03/2022. FINDINGS: The heart is enlarged and the mediastinal contour is stable. There is a large hiatal hernia. Pulmonary vascular congestion is present. Atherosclerotic calcification of the aorta is noted. Patchy perihilar airspace disease is noted bilaterally.  No effusion or pneumothorax. No acute osseous abnormality. IMPRESSION: 1. Cardiomegaly with pulmonary vascular congestion. 2. Patchy perihilar airspace disease bilaterally suggesting edema. Correlate clinically to exclude infection. 3. Large hiatal hernia. Electronically Signed   By: Brett Fairy M.D.   On: 07/09/2022 02:16    Procedures Procedures    Medications Ordered in ED Medications  ondansetron (ZOFRAN) injection 4 mg (has no administration in time range)  furosemide (LASIX) injection 40 mg (has no administration in time range)    ED Course/ Medical Decision Making/ A&P                             Medical Decision Making SOB sudden onset with edema   Amount and/or Complexity of Data Reviewed Independent Historian: EMS    Details: See above  External Data Reviewed: notes.    Details: Previous notes reviewed  Labs: ordered.    Details: All labs reviewed: troponin normal 8, sodium slight low 134, normal potassium 4.9,  creatinine slight elevation 1.11, bilirubin slight elevation 1.3.  Normal white count 7.4, elevated hemoglobin 15.4 platelets normal  Radiology: ordered and independent interpretation performed.    Details: Pulmonary edema by me on CXR ECG/medicine tests: ordered and independent interpretation performed. Decision-making details documented in ED Course.  Risk Prescription drug management. Decision regarding hospitalization. Risk Details: Patient on BIPAP immediately upon arrival.  Sudden onset SOB with pulmonary edema and elevated BNP  Critical Care Total time providing critical care: 30 minutes (BIPAP)    Final Clinical Impression(s) / ED  Diagnoses Final diagnoses:  None   The patient appears reasonably stabilized for admission considering the current resources, flow, and capabilities available in the ED at this time, and I doubt any other [facility] requiring further screening and/or treatment in the ED prior to admission.  Rx / DC Orders ED Discharge  Orders     None         Aqeel Norgaard, MD 07/09/22 [phone]

## 2022-07-10 DIAGNOSIS — E871 Hypo-osmolality and hyponatremia: Secondary | ICD-10-CM | POA: Diagnosis not present

## 2022-07-10 DIAGNOSIS — I5043 Acute on chronic combined systolic (congestive) and diastolic (congestive) heart failure: Secondary | ICD-10-CM | POA: Diagnosis not present

## 2022-07-10 DIAGNOSIS — I10 Essential (primary) hypertension: Secondary | ICD-10-CM | POA: Diagnosis not present

## 2022-07-10 DIAGNOSIS — I480 Paroxysmal atrial fibrillation: Secondary | ICD-10-CM | POA: Diagnosis not present

## 2022-07-10 LAB — CBC
HCT: 38.9 % (ref 36.0–46.0)
Hemoglobin: 13 g/dL (ref 12.0–15.0)
MCH: 31.9 pg (ref 26.0–34.0)
MCHC: 33.4 g/dL (ref 30.0–36.0)
MCV: 95.3 fL (ref 80.0–100.0)
Platelets: 287 10*3/uL (ref 150–400)
RBC: 4.08 MIL/uL (ref 3.87–5.11)
RDW: 13.6 % (ref 11.5–15.5)
WBC: 10.8 10*3/uL — ABNORMAL HIGH (ref 4.0–10.5)
nRBC: 0 % (ref 0.0–0.2)

## 2022-07-10 LAB — BASIC METABOLIC PANEL
Anion gap: 11 (ref 5–15)
BUN: 33 mg/dL — ABNORMAL HIGH (ref 8–23)
CO2: 27 mmol/L (ref 22–32)
Calcium: 8.7 mg/dL — ABNORMAL LOW (ref 8.9–10.3)
Chloride: 93 mmol/L — ABNORMAL LOW (ref 98–111)
Creatinine, Ser: 1.22 mg/dL — ABNORMAL HIGH (ref 0.44–1.00)
GFR, Estimated: 42 mL/min — ABNORMAL LOW (ref >60)
Glucose, Bld: 121 mg/dL — ABNORMAL HIGH (ref 70–99)
Potassium: 3.6 mmol/L (ref 3.5–5.1)
Sodium: 131 mmol/L — ABNORMAL LOW (ref 135–145)

## 2022-07-10 LAB — MAGNESIUM: Magnesium: 2.2 mg/dL (ref 1.7–2.4)

## 2022-07-10 MED ORDER — SPIRONOLACTONE 12.5 MG HALF TABLET
12.5000 mg | ORAL_TABLET | Freq: Every day | ORAL | Status: DC
  Administered 2022-07-10 – 2022-07-11 (×2): 12.5 mg via ORAL
  Filled 2022-07-10 (×2): qty 1

## 2022-07-10 MED ORDER — POTASSIUM CHLORIDE CRYS ER 20 MEQ PO TBCR
20.0000 meq | EXTENDED_RELEASE_TABLET | Freq: Once | ORAL | Status: AC
  Administered 2022-07-10: 20 meq via ORAL
  Filled 2022-07-10: qty 1

## 2022-07-10 NOTE — Progress Notes (Signed)
   Heart Failure Stewardship Pharmacist Progress Note   PCP: Deland Pretty, MD PCP-Cardiologist: Mertie Moores, MD    HPI:  89 yo F with PMH of CHF, HTN, hypothyroidism, and afib.    3 recent back to back admissions for CHF 8/23, 9/23 and 10/23.   Echo done 8/23 hospitalization. EF down to 40-45%, RV normal, in the setting of new atrial fibrillation. Diuresed w/ IV Lasix. Converted to SR on amio gtt. Transitioned to metoprolol and Eliquis. Discharged home w/ PRN lasix. Presented back 9/23 w/ recurrent SOB and CP. CXR showed mild pulmonary edema. Back in Afib w/ RVR. HS trop mildly elevated, peaked at 279. Diuresed w/ IV Lasix. Placed on PO amio for rhythm control w/ conversion back to SR.  R/LHC after diuresis showed normal cors and low filling pressures, mRAP 1, mPCW 3, PAP 12/5, CI 5. SGLT2i was added to regimen. Instructed to continue Lasix PRN. Discharged on 10/4. D/c wt 138 lb. Referred to [facility] clinic. Improved back to baseline, weight 142 lbs. Stable during cardiology follow up on 2/20, weight was 141 lbs.   Presented back to the ED on 3/4 with shortness of breath and hypoxia. No weight gain or LE edema. CXR with cardiomegaly and pulmonary vascular congestion. ECHO 3/4 showed improvement of EF to 55-60%, mild LVH, G2DD, and RV normal.   Current HF Medications: Beta Blocker: metoprolol tartrate 12.5 mg BID MRA: spironolactone 12.5 mg daily SGLT2i: Farxiga 10 mg daily  Prior to admission HF Medications: Diuretic: furosemide 20 mg daily PRN Beta blocker: metoprolol tartrate 25 mg BID SGLT2i: Farxiga 10 mg daily  Pertinent Lab Values: Serum creatinine 1.22, BUN 33, Potassium 3.6, Sodium 131, BNP 484.1, Magnesium 2.2  Vital Signs: Weight: 132 lbs (admission weight: 138 lbs) Blood pressure: 130/60s  Heart rate: 60-70s  I/O: -1.7L yesterday; net -2L  Medication Assistance / Insurance Benefits Check: Does the patient have prescription insurance?  Yes Type of insurance plan:  [employer]  Outpatient Pharmacy:  Prior to admission [facility] Is the patient willing to use [place] pharmacy at discharge? Yes Is the patient willing to transition their outpatient pharmacy to utilize a [facility] outpatient pharmacy?   Pending    Assessment: 1. Acute on chronic diastolic CHF (LVEF 0000000), due to NICM. NYHA class II symptoms. - Has been diuresed with IV lasix, symptoms improved.  - Continue metoprolol tartrate 12.5 mg BID - Agree with adding spironolactone 12.5 mg daily  - Continue Farxiga 10 mg daily   Plan: 1) Medication changes recommended at this time: - Agree with changes  2) Patient assistance: - None pending  3)  Education  - To be completed prior to discharge  Kerby Nora, PharmD, BCPS Heart Failure Cytogeneticist Phone [phone]

## 2022-07-10 NOTE — Evaluation (Signed)
Physical Therapy Evaluation Patient Details Name: Ellen Chandler MRN: [id number] DOB: 06-26-1930 Today's Date: 07/10/2022  History of Present Illness  Ellen Chandler is a 89 y.o. female with medical history significant of chronic combined CHF, hypertension, hypothyroidism, paroxysmal A-fib on Eliquis presented to ED via EMS for evaluation of shortness of breath.  Oxygen saturation in the low 70s on room air and was placed on BiPAP with improvement of oxygen saturation to 90s.  Clinical Impression  Pt presents with admitting diagnosis above. Pt tolerated treatment well. Pt was able ambulate in the hallway with RW on 1L O2. Pt moved fairly however would like to progress to rollator next session and wean off O2 as that is pt baseline. Pt would likely only need 1 more session before DC home with HHPT. Pt would also benefit from continued mobility from mobility specialist.      Recommendations for follow up therapy are one component of a multi-disciplinary discharge planning process, led by the attending physician.  Recommendations may be updated based on patient status, additional functional criteria and insurance authorization.  Follow Up Recommendations Home health PT      Assistance Recommended at Discharge Intermittent Supervision/Assistance  Patient can return home with the following  A little help with walking and/or transfers;A little help with bathing/dressing/bathroom;Assistance with cooking/housework;Direct supervision/assist for medications management;Assist for transportation;Help with stairs or ramp for entrance    Equipment Recommendations None recommended by PT (pt has needed DME)  Recommendations for Other Services       Functional Status Assessment Patient has had a recent decline in their functional status and demonstrates the ability to make significant improvements in function in a reasonable and predictable amount of time.     Precautions / Restrictions  Precautions Precautions: Fall Restrictions Weight Bearing Restrictions: No      Mobility  Bed Mobility Overal bed mobility: Needs Assistance Bed Mobility: Supine to Sit, Sit to Supine     Supine to sit: Supervision Sit to supine: Supervision        Transfers Overall transfer level: Needs assistance Equipment used: Rolling walker (2 wheels) Transfers: Sit to/from Stand Sit to Stand: Supervision                Ambulation/Gait Ambulation/Gait assistance: Min guard Gait Distance (Feet): 150 Feet Assistive device: Rolling walker (2 wheels) Gait Pattern/deviations: Step-through pattern, Decreased stride length, Trunk flexed Gait velocity: decreased        Stairs            Wheelchair Mobility    Modified Rankin (Stroke Patients Only)       Balance Overall balance assessment: No apparent balance deficits (not formally assessed)                                           Pertinent Vitals/Pain Pain Assessment Pain Assessment: No/denies pain    Home Living Family/patient expects to be discharged to:: Private residence Living Arrangements: Alone Available Help at Discharge: Family;Available PRN/intermittently Type of Home: Independent living facility Home Access: Level entry       Home Layout: One level Home Equipment: Conservation officer, nature (2 wheels);Rollator (4 wheels);BSC/3in1;Grab bars - tub/shower;Grab bars - toilet      Prior Function Prior Level of Function : Independent/Modified Independent             Mobility Comments: Uses rollator for ambulation ADLs Comments: assist  for cleaning 1x/month, family gets groceries and takes her to appointments, they check on her daily     Hand Dominance   Dominant Hand: Right    Extremity/Trunk Assessment   Upper Extremity Assessment Upper Extremity Assessment: Overall WFL for tasks assessed    Lower Extremity Assessment Lower Extremity Assessment: Overall WFL for tasks  assessed       Communication   Communication: No difficulties  Cognition Arousal/Alertness: Awake/alert Behavior During Therapy: WFL for tasks assessed/performed Overall Cognitive Status: Within Functional Limits for tasks assessed                                          General Comments General comments (skin integrity, edema, etc.): Pt received on 1L at 93%. Pt dropped to 87% on RA. Pt able to ambulate on 1L at 91%. Pt returned to bed at 93%    Exercises     Assessment/Plan    PT Assessment Patient needs continued PT services  PT Problem List Decreased strength;Decreased activity tolerance;Decreased mobility;Decreased coordination;Decreased knowledge of use of DME;Cardiopulmonary status limiting activity       PT Treatment Interventions Gait training;Stair training;DME instruction;Functional mobility training;Therapeutic activities;Therapeutic exercise;Neuromuscular re-education;Patient/family education    PT Goals (Current goals can be found in the Care Plan section)  Acute Rehab PT Goals Patient Stated Goal: "to get stronger" PT Goal Formulation: With patient Time For Goal Achievement: 07/24/22 Potential to Achieve Goals: Good    Frequency Min 3X/week     Co-evaluation               AM-PAC PT "6 Clicks" Mobility  Outcome Measure Help needed turning from your back to your side while in a flat bed without using bedrails?: None Help needed moving from lying on your back to sitting on the side of a flat bed without using bedrails?: None Help needed moving to and from a bed to a chair (including a wheelchair)?: A Little Help needed standing up from a chair using your arms (e.g., wheelchair or bedside chair)?: A Little Help needed to walk in hospital room?: A Little Help needed climbing 3-5 steps with a railing? : A Little 6 Click Score: 20    End of Session Equipment Utilized During Treatment: Gait belt;Oxygen Activity Tolerance: Patient  tolerated treatment well Patient left: in bed;with call bell/phone within reach;with nursing/sitter in room Nurse Communication: Mobility status;Other (comment) (O2 sat) PT Visit Diagnosis: Other abnormalities of gait and mobility (R26.89)    Time: [id number] PT Time Calculation (min) (ACUTE ONLY): 33 min   Charges:   PT Evaluation $PT Eval Low Complexity: 1 Low PT Treatments $Gait Training: 23-37 mins        Shelby Mattocks, PT, DPT Acute Rehab Services [id number]   Viann Shove 07/10/2022, 4:49 PM

## 2022-07-10 NOTE — Progress Notes (Signed)
Heart Failure Nurse Navigator Progress Note  PCP: Deland Pretty, MD PCP-Cardiologist: N[place] Admission Diagnosis: None Admitted from: [place] via EMS  Presentation:   Ellen Chandler presented with Shortness of breath oxygen sats in the 70's, placed on BiPAP,  BP 104/57, HR 63, BLE , BNP 484.1, IV lasix given, CXR showing pulmonary edema, Patient was last seen in HF [facility] clinic on 02/14/2022.   Patient educated on the sign and symptoms of heart failure, daily weights, when to call her doctor or go to the ED. Diet/ fluid restrictions, patient reported to eating " low salt chips " everyday. Patient confirmed she takes all her medication as prescribed and attends her medical appointments. A HF TOC follow up appointment was scheduled for 07/20/2022 @ 9:30 am.   ECHO/ LVEF: 55-60% G2DD  Clinical Course:  Past Medical History:  Diagnosis Date   Abnormal ECG    a. Pt aware of long hx of abnl ecg->h/o normal stress testing in [place] ~ 2000.   Diastolic dysfunction    a. 04/2013 Echo: EF 60-65%, No rwma, Gr 1 DD, mild MR.   GERD (gastroesophageal reflux disease)    Hypertension    Hypothyroidism      Social History   Socioeconomic History   Marital status: Widowed    Spouse name: Not on file   Number of children: 4   Years of education: Not on file   Highest education level: High school graduate  Occupational History   Occupation: Retired  Tobacco Use   Smoking status: Never   Smokeless tobacco: Never  Vaping Use   Vaping Use: Never used  Substance and Sexual Activity   Alcohol use: Never   Drug use: No   Sexual activity: Not on file  Other Topics Concern   Not on file  Social History Narrative   Moved to [place] from [place] in June 2014.  Lives at [facility]  Was exercising daily.   Social Determinants of Health   Financial Resource Strain: Low Risk  (07/10/2022)   Overall Financial Resource Strain (CARDIA)     Difficulty of Paying Living Expenses: Not hard at all  Food Insecurity: No Food Insecurity (07/09/2022)   Hunger Vital Sign    Worried About Running Out of Food in the Last Year: Never true    Ran Out of Food in the Last Year: Never true  Transportation Needs: No Transportation Needs (07/09/2022)   PRAPARE - Hydrologist (Medical): No    Lack of Transportation (Non-Medical): No  Physical Activity: Not on file  Stress: Not on file  Social Connections: Not on file    High Risk Criteria for Readmission and/or Poor Patient Outcomes: Heart failure hospital admissions (last 6 months): 1  No Show rate: 0 Difficult social situation: No Demonstrates medication adherence: yes Primary Language: English Literacy level: Reading, writing, and comprehension  Barriers of Care:   Diet/ fluids ( eats chips everyday) Daily weights  Considerations/Referrals:   Referral made to Heart Failure Pharmacist Stewardship: Yes Referral made to Heart Failure CSW/NCM TOC: No Referral made to Heart & Vascular TOC clinic: yes , Follow up 07/20/2022 @ 9:30 am  Items for Follow-up on DC/TOC: Diet/ fluid restrictions ( reported eats chips everyday) Daily weights  Continued HF Education   Earnestine Leys, BSN, RN Heart Failure Transport planner Only

## 2022-07-10 NOTE — Progress Notes (Signed)
PROGRESS NOTE    Ellen Chandler  [id number] DOB: [birth year] DOA: 07/09/2022 PCP: Deland Pretty, MD   Brief Narrative:   Ellen Chandler is a 89 y.o. female with medical history significant of chronic combined CHF, hypertension, hypothyroidism, paroxysmal A-fib on Eliquis presented to ED via EMS for evaluation of shortness of breath.  Oxygen saturation in the low 70s on room air and was placed on BiPAP with improvement of oxygen saturation to 90s.  She has now been weaned to nasal cannula oxygen.  Appreciate ongoing cardiology evaluation.  At this point, she will require overnight pulse oximetry study to evaluate for need for nighttime home oxygen.  Anticipate discharge possibly with home oxygen by 3/6.  Assessment & Plan:   Principal Problem:   Acute on chronic combined systolic (congestive) and diastolic (congestive) heart failure (HCC) Active Problems:   Essential hypertension   Hypothyroidism   Hyponatremia   Acute hypoxemic respiratory failure (HCC)   Paroxysmal A-fib (HCC)  Assessment and Plan:  Acute on chronic combined CHF Acute hypoxemic respiratory failure Oxygen saturation in the low 70s on room air initially, currently stable on BiPAP.  BNP 484.  Chest x-ray showing pulmonary edema. Echo done in August 2023 showing EF 40 to [id number], grade 1 diastolic dysfunction, trivial mitral valve regurgitation, moderately dilated left atrium, trivial aortic valve regurgitation.  Patient received IV Lasix 40 mg in the ED. Blood pressure currently soft, order additional doses of Lasix in the morning if blood pressure is able to tolerate.  Cardiac monitoring, monitor intake and output, daily weights, low-sodium diet with fluid restriction, and monitor renal function.  Repeat echocardiogram ordered.  Continuous pulse ox.  Continue BiPAP at this time, wean as tolerated.  -Appreciate ongoing cardiology recommendations -Plan to check overnight pulse oximetry study to determine if she will  require nighttime oxygen   Mild hypervolemic hyponatremia-resolved Continue diuresis and monitor labs.   Hypertension Continue diuresis and continue to hold metoprolol for heart rate less than 60   Paroxysmal A-fib Current rhythm is sinus bradycardia with heart rate in the 50s.  Hold metoprolol at this time.  Resume Eliquis and amiodarone   Hypothyroidism Continue Synthroid after pharmacy med rec is done.  TSH 3.1.    DVT prophylaxis: Eliquis Code Status: DNR Family Communication: None at bedside Disposition Plan:  Status is: Inpatient Remains inpatient appropriate because: Need for IV medications   Consultants:  Cardiology  Procedures:  None  Antimicrobials:  None   Subjective: Patient seen and evaluated today with no new acute complaints or concerns. No acute concerns or events noted overnight.  She overall states she is feeling much better and denies any chest pain or shortness of breath.  Objective: Vitals:   07/09/22 1449 07/09/22 1555 07/09/22 2010 07/10/22 0005  BP:  (!) 125/58 134/64 (!) 104/57  Pulse: 66 65 62 62  Resp:  19 (!) 21 (!) 23  Temp:  98.4 F (36.9 C) 98.4 F (36.9 C) 98 F (36.7 C)  TempSrc:  Oral Oral Oral  SpO2:  91% 94% 91%  Weight:    60.3 kg  Height:        Intake/Output Summary (Last 24 hours) at 07/10/2022 0658 Last data filed at 07/09/2022 2100 Gross per 24 hour  Intake 840 ml  Output 1400 ml  Net -560 ml   Filed Weights   07/09/22 0145 07/09/22 0551 07/10/22 0005  Weight: 63.5 kg 62.9 kg 60.3 kg    Examination:  General exam: Appears calm  and comfortable  Respiratory system: Clear to auscultation. Respiratory effort normal.  Currently on room air. Cardiovascular system: S1 & S2 heard, RRR.  Gastrointestinal system: Abdomen is soft Central nervous system: Alert and awake Extremities: No edema Skin: No significant lesions noted Psychiatry: Flat affect.    Data Reviewed: I have personally reviewed following labs and  imaging studies  CBC: Recent Labs  Lab 07/09/22 0156 07/09/22 0517 07/10/22 0042  WBC 7.4 10.6* 10.8*  NEUTROABS 4.3  --   --   HGB 15.4* 15.2* 13.0  HCT 45.5 44.5 38.9  MCV 96.4 95.7 95.3  PLT 322 305 [id number]   Basic Metabolic Panel: Recent Labs  Lab 07/09/22 0156 07/09/22 0517 07/10/22 0042  NA 134* 136 131*  K 4.9 4.7 3.6  CL 101 101 93*  CO2 '24 27 27  '$ GLUCOSE 163* 112* 121*  BUN 29* 28* 33*  CREATININE 1.11* 1.19* 1.22*  CALCIUM 8.5* 8.6* 8.7*  MG  --   --  2.2   GFR: Estimated Creatinine Clearance: 28.1 mL/min (A) (by C-G formula based on SCr of 1.22 mg/dL (H)). Liver Function Tests: Recent Labs  Lab 07/09/22 0156 07/09/22 0517  AST 44* 34  ALT 22 22  ALKPHOS 37* 33*  BILITOT 1.3* 0.6  PROT 6.0* 6.0*  ALBUMIN 3.1* 3.2*   No results for input(s): "LIPASE", "AMYLASE" in the last 168 hours. No results for input(s): "AMMONIA" in the last 168 hours. Coagulation Profile: No results for input(s): "INR", "PROTIME" in the last 168 hours. Cardiac Enzymes: No results for input(s): "CKTOTAL", "CKMB", "CKMBINDEX", "TROPONINI" in the last 168 hours. BNP (last 3 results) No results for input(s): "PROBNP" in the last 8760 hours. HbA1C: No results for input(s): "HGBA1C" in the last 72 hours. CBG: No results for input(s): "GLUCAP" in the last 168 hours. Lipid Profile: No results for input(s): "CHOL", "HDL", "LDLCALC", "TRIG", "CHOLHDL", "LDLDIRECT" in the last 72 hours. Thyroid Function Tests: Recent Labs    07/09/22 0517  TSH 3.110   Anemia Panel: No results for input(s): "VITAMINB12", "FOLATE", "FERRITIN", "TIBC", "IRON", "RETICCTPCT" in the last 72 hours. Sepsis Labs: No results for input(s): "PROCALCITON", "LATICACIDVEN" in the last 168 hours.  Recent Results (from the past 240 hour(s))  Resp panel by RT-PCR (RSV, Flu A&B, Covid) Anterior Nasal Swab     Status: None   Collection Time: 07/09/22  3:47 AM   Specimen: Anterior Nasal Swab  Result Value Ref  Range Status   SARS Coronavirus 2 by RT PCR NEGATIVE NEGATIVE Final   Influenza A by PCR NEGATIVE NEGATIVE Final   Influenza B by PCR NEGATIVE NEGATIVE Final    Comment: (NOTE) The Xpert Xpress SARS-CoV-2/FLU/RSV plus assay is intended as an aid in the diagnosis of influenza from Nasopharyngeal swab specimens and should not be used as a sole basis for treatment. Nasal washings and aspirates are unacceptable for Xpert Xpress SARS-CoV-2/FLU/RSV testing.  Fact Sheet for Patients: EntrepreneurPulse.com.au  Fact Sheet for Healthcare Providers: IncredibleEmployment.be  This test is not yet approved or cleared by the [place] FDA and has been authorized for detection and/or diagnosis of SARS-CoV-2 by FDA under an Emergency Use Authorization (EUA). This EUA will remain in effect (meaning this test can be used) for the duration of the COVID-19 declaration under Section 564(b)(1) of the Act, 21 U.S.C. section 360bbb-3(b)(1), unless the authorization is terminated or revoked.     Resp Syncytial Virus by PCR NEGATIVE NEGATIVE Final    Comment: (NOTE) Fact Sheet for Patients: EntrepreneurPulse.com.au  Fact Sheet for Healthcare Providers: IncredibleEmployment.be  This test is not yet approved or cleared by the [place] FDA and has been authorized for detection and/or diagnosis of SARS-CoV-2 by FDA under an Emergency Use Authorization (EUA). This EUA will remain in effect (meaning this test can be used) for the duration of the COVID-19 declaration under Section 564(b)(1) of the Act, 21 U.S.C. section 360bbb-3(b)(1), unless the authorization is terminated or revoked.  Performed at [facility], [place] [address] [place], [place] [address]          Radiology Studies: ECHOCARDIOGRAM COMPLETE  Result Date: 07/09/2022    ECHOCARDIOGRAM REPORT   Patient Name:   EABHA ORBE Applegate Date of Exam: 07/09/2022  Medical Rec #:  [id number]         Height:       65.0 in Accession #:    [id number]        Weight:       138.7 lb Date of Birth:  08/14/1930         BSA:          1.693 m Patient Age:    38 years          BP:           111/65 mmHg Patient Gender: F                 HR:           54 bpm. Exam Location:  Inpatient Procedure: 2D Echo, Cardiac Doppler and Color Doppler Indications:    acute systolic chf  History:        Patient has prior history of Echocardiogram examinations, most                 recent 01/04/2022. CHF, Arrythmias:Atrial Fibrillation; Risk                 Factors:Hypertension.  Sonographer:    Johny Chess RDCS Referring Phys: [id number] RATHORE  Sonographer Comments: Image acquisition challenging due to respiratory motion. IMPRESSIONS  1. Left ventricular ejection fraction, by estimation, is 55 to 60%. The left ventricle has normal function. The left ventricle has no regional wall motion abnormalities. There is mild left ventricular hypertrophy. Left ventricular diastolic parameters are consistent with Grade II diastolic dysfunction (pseudonormalization).  2. Right ventricular systolic function is normal. The right ventricular size is normal. There is normal pulmonary artery systolic pressure.  3. Left atrial size was severely dilated.  4. Mild mitral valve regurgitation.  5. The aortic valve is tricuspid. Aortic valve regurgitation is mild. Comparison(s): The left ventricular function has improved. FINDINGS  Left Ventricle: Left ventricular ejection fraction, by estimation, is 55 to 60%. The left ventricle has normal function. The left ventricle has no regional wall motion abnormalities. The left ventricular internal cavity size was normal in size. There is  mild left ventricular hypertrophy. Left ventricular diastolic parameters are consistent with Grade II diastolic dysfunction (pseudonormalization). Right Ventricle: The right ventricular size is normal. Right vetricular wall thickness was  not assessed. Right ventricular systolic function is normal. There is normal pulmonary artery systolic pressure. The tricuspid regurgitant velocity is 2.44 m/s, and with an assumed right atrial pressure of 3 mmHg, the estimated right ventricular systolic pressure is 0000000 mmHg. Left Atrium: Left atrial size was severely dilated. Right Atrium: Right atrial size was normal in size. Pericardium: There is no evidence of pericardial effusion. Mitral Valve: There is mild thickening of the mitral valve  leaflet(s). There is mild calcification of the mitral valve leaflet(s). Mild mitral valve regurgitation. Tricuspid Valve: The tricuspid valve is normal in structure. Tricuspid valve regurgitation is trivial. Aortic Valve: The aortic valve is tricuspid. Aortic valve regurgitation is mild. Pulmonic Valve: The pulmonic valve was normal in structure. Pulmonic valve regurgitation is not visualized. Aorta: The aortic root and ascending aorta are structurally normal, with no evidence of dilitation. IAS/Shunts: No atrial level shunt detected by color flow Doppler.  LEFT VENTRICLE PLAX 2D LVIDd:         4.20 cm   Diastology LVIDs:         2.90 cm   LV e' medial:    4.90 cm/s LV PW:         1.00 cm   LV E/e' medial:  19.9 LV IVS:        1.20 cm   LV e' lateral:   7.94 cm/s LVOT diam:     1.80 cm   LV E/e' lateral: 12.3 LV SV:         41 LV SV Index:   24 LVOT Area:     2.54 cm  RIGHT VENTRICLE             IVC RV Basal diam:  2.60 cm     IVC diam: 1.40 cm RV S prime:     10.30 cm/s TAPSE (M-mode): 2.6 cm LEFT ATRIUM              Index        RIGHT ATRIUM           Index LA diam:        4.00 cm  2.36 cm/m   RA Area:     12.40 cm LA Vol (A2C):   52.0 ml  30.71 ml/m  RA Volume:   24.70 ml  14.59 ml/m LA Vol (A4C):   110.0 ml 64.97 ml/m LA Biplane Vol: 75.9 ml  44.83 ml/m  AORTIC VALVE LVOT Vmax:   89.00 cm/s LVOT Vmean:  54.800 cm/s LVOT VTI:    0.163 m  AORTA Ao Root diam: 3.30 cm Ao Asc diam:  3.30 cm MITRAL VALVE                TRICUSPID VALVE MV Area (PHT): 3.31 cm    TR Peak grad:   23.8 mmHg MV Decel Time: 229 msec    TR Vmax:        244.00 cm/s MV E velocity: 97.70 cm/s MV A velocity: 87.00 cm/s  SHUNTS MV E/A ratio:  1.12        Systemic VTI:  0.16 m                            Systemic Diam: 1.80 cm Dorris Carnes MD Electronically signed by Dorris Carnes MD Signature Date/Time: 07/09/2022/6:03:45 PM    Final    DG Chest Portable 1 View  Result Date: 07/09/2022 CLINICAL DATA:  Shortness of breath. EXAM: PORTABLE CHEST 1 VIEW COMPARISON:  02/03/2022. FINDINGS: The heart is enlarged and the mediastinal contour is stable. There is a large hiatal hernia. Pulmonary vascular congestion is present. Atherosclerotic calcification of the aorta is noted. Patchy perihilar airspace disease is noted bilaterally. No effusion or pneumothorax. No acute osseous abnormality. IMPRESSION: 1. Cardiomegaly with pulmonary vascular congestion. 2. Patchy perihilar airspace disease bilaterally suggesting edema. Correlate clinically to exclude infection. 3. Large hiatal hernia. Electronically Signed  By: Brett Fairy M.D.   On: 07/09/2022 02:16        Scheduled Meds:  amiodarone  200 mg Oral Daily   apixaban  5 mg Oral BID   dapagliflozin propanediol  10 mg Oral Daily   levothyroxine  88 mcg Oral QAC breakfast   lidocaine  1 patch Transdermal Q24H   metoprolol tartrate  12.5 mg Oral BID   mirabegron ER  50 mg Oral Daily   ondansetron (ZOFRAN) IV  4 mg Intravenous Once   pantoprazole  40 mg Oral BID     LOS: 1 day    Time spent: 35 minutes    Armonie Mettler Darleen Crocker, DO [facility]  If 7PM-7AM, please contact night-coverage [email or web address] 07/10/2022, 6:58 AM

## 2022-07-10 NOTE — Progress Notes (Signed)
Rounding Note    Patient Name: Ellen Chandler Date of Encounter: 07/10/2022  [place] Cardiologist: Mertie Moores, MD   Subjective   Feeling much better this morning. Breathing has improved.   Inpatient Medications    Scheduled Meds:  amiodarone  200 mg Oral Daily   apixaban  5 mg Oral BID   dapagliflozin propanediol  10 mg Oral Daily   levothyroxine  88 mcg Oral QAC breakfast   lidocaine  1 patch Transdermal Q24H   metoprolol tartrate  12.5 mg Oral BID   mirabegron ER  50 mg Oral Daily   ondansetron (ZOFRAN) IV  4 mg Intravenous Once   pantoprazole  40 mg Oral BID   spironolactone  12.5 mg Oral Daily   Continuous Infusions:  PRN Meds: acetaminophen **OR** acetaminophen, mouth rinse   Vital Signs    Vitals:   07/10/22 0842 07/10/22 0845 07/10/22 0846 07/10/22 0847  BP:      Pulse:  68  70  Resp:    18  Temp:      TempSrc:      SpO2: 91%   91%  Weight:   60.3 kg   Height:        Intake/Output Summary (Last 24 hours) at 07/10/2022 1041 Last data filed at 07/09/2022 2100 Gross per 24 hour  Intake 360 ml  Output 1400 ml  Net -1040 ml       07/10/2022    8:46 AM 07/10/2022   12:05 AM 07/09/2022    5:51 AM  Last 3 Weights  Weight (lbs) 132 lb 15 oz 132 lb 15 oz 138 lb 10.7 oz  Weight (kg) 60.3 kg 60.3 kg 62.9 kg      Telemetry    Sinus Rhythm - Personally Reviewed  ECG    No new tracing   Physical Exam   GEN: No acute distress.   Neck: No JVD Cardiac: RRR, no murmurs, rubs, or gallops.  Respiratory: Diminished in bases GI: Soft, nontender, non-distended  MS: No edema; No deformity. Neuro:  Nonfocal  Psych: Normal affect   Labs    High Sensitivity Troponin:   Recent Labs  Lab 07/09/22 0156 07/09/22 0355  TROPONINIHS 8 10      Chemistry Recent Labs  Lab 07/09/22 0156 07/09/22 0517 07/10/22 0042  NA 134* 136 131*  K 4.9 4.7 3.6  CL 101 101 93*  CO2 '24 27 27  '$ GLUCOSE 163* 112* 121*  BUN 29* 28* 33*  CREATININE  1.11* 1.19* 1.22*  CALCIUM 8.5* 8.6* 8.7*  MG  --   --  2.2  PROT 6.0* 6.0*  --   ALBUMIN 3.1* 3.2*  --   AST 44* 34  --   ALT 22 22  --   ALKPHOS 37* 33*  --   BILITOT 1.3* 0.6  --   GFRNONAA 48* 44* 42*  ANIONGAP '9 8 11     '$ Lipids No results for input(s): "CHOL", "TRIG", "HDL", "LABVLDL", "LDLCALC", "CHOLHDL" in the last 168 hours.  Hematology Recent Labs  Lab 07/09/22 0156 07/09/22 0517 07/10/22 0042  WBC 7.4 10.6* 10.8*  RBC 4.72 4.65 4.08  HGB 15.4* 15.2* 13.0  HCT 45.5 44.5 38.9  MCV 96.4 95.7 95.3  MCH 32.6 32.7 31.9  MCHC 33.8 34.2 33.4  RDW 13.8 13.8 13.6  PLT 322 305 287    Thyroid  Recent Labs  Lab 07/09/22 0517  TSH 3.110     BNP Recent Labs  Lab  07/09/22 0156  BNP 484.1*     DDimer No results for input(s): "DDIMER" in the last 168 hours.   Radiology    ECHOCARDIOGRAM COMPLETE  Result Date: 07/09/2022    ECHOCARDIOGRAM REPORT   Patient Name:   Ellen Chandler Date of Exam: 07/09/2022 Medical Rec #:  [id number]         Height:       65.0 in Accession #:    [id number]        Weight:       138.7 lb Date of Birth:  05-14-1930         BSA:          1.693 m Patient Age:    42 years          BP:           111/65 mmHg Patient Gender: F                 HR:           54 bpm. Exam Location:  Inpatient Procedure: 2D Echo, Cardiac Doppler and Color Doppler Indications:    acute systolic chf  History:        Patient has prior history of Echocardiogram examinations, most                 recent 01/04/2022. CHF, Arrythmias:Atrial Fibrillation; Risk                 Factors:Hypertension.  Sonographer:    Johny Chess RDCS Referring Phys: [id number] RATHORE  Sonographer Comments: Image acquisition challenging due to respiratory motion. IMPRESSIONS  1. Left ventricular ejection fraction, by estimation, is 55 to 60%. The left ventricle has normal function. The left ventricle has no regional wall motion abnormalities. There is mild left ventricular hypertrophy. Left  ventricular diastolic parameters are consistent with Grade II diastolic dysfunction (pseudonormalization).  2. Right ventricular systolic function is normal. The right ventricular size is normal. There is normal pulmonary artery systolic pressure.  3. Left atrial size was severely dilated.  4. Mild mitral valve regurgitation.  5. The aortic valve is tricuspid. Aortic valve regurgitation is mild. Comparison(s): The left ventricular function has improved. FINDINGS  Left Ventricle: Left ventricular ejection fraction, by estimation, is 55 to 60%. The left ventricle has normal function. The left ventricle has no regional wall motion abnormalities. The left ventricular internal cavity size was normal in size. There is  mild left ventricular hypertrophy. Left ventricular diastolic parameters are consistent with Grade II diastolic dysfunction (pseudonormalization). Right Ventricle: The right ventricular size is normal. Right vetricular wall thickness was not assessed. Right ventricular systolic function is normal. There is normal pulmonary artery systolic pressure. The tricuspid regurgitant velocity is 2.44 m/s, and with an assumed right atrial pressure of 3 mmHg, the estimated right ventricular systolic pressure is 0000000 mmHg. Left Atrium: Left atrial size was severely dilated. Right Atrium: Right atrial size was normal in size. Pericardium: There is no evidence of pericardial effusion. Mitral Valve: There is mild thickening of the mitral valve leaflet(s). There is mild calcification of the mitral valve leaflet(s). Mild mitral valve regurgitation. Tricuspid Valve: The tricuspid valve is normal in structure. Tricuspid valve regurgitation is trivial. Aortic Valve: The aortic valve is tricuspid. Aortic valve regurgitation is mild. Pulmonic Valve: The pulmonic valve was normal in structure. Pulmonic valve regurgitation is not visualized. Aorta: The aortic root and ascending aorta are structurally normal, with no evidence of  dilitation. IAS/Shunts: No atrial  level shunt detected by color flow Doppler.  LEFT VENTRICLE PLAX 2D LVIDd:         4.20 cm   Diastology LVIDs:         2.90 cm   LV e' medial:    4.90 cm/s LV PW:         1.00 cm   LV E/e' medial:  19.9 LV IVS:        1.20 cm   LV e' lateral:   7.94 cm/s LVOT diam:     1.80 cm   LV E/e' lateral: 12.3 LV SV:         41 LV SV Index:   24 LVOT Area:     2.54 cm  RIGHT VENTRICLE             IVC RV Basal diam:  2.60 cm     IVC diam: 1.40 cm RV S prime:     10.30 cm/s TAPSE (M-mode): 2.6 cm LEFT ATRIUM              Index        RIGHT ATRIUM           Index LA diam:        4.00 cm  2.36 cm/m   RA Area:     12.40 cm LA Vol (A2C):   52.0 ml  30.71 ml/m  RA Volume:   24.70 ml  14.59 ml/m LA Vol (A4C):   110.0 ml 64.97 ml/m LA Biplane Vol: 75.9 ml  44.83 ml/m  AORTIC VALVE LVOT Vmax:   89.00 cm/s LVOT Vmean:  54.800 cm/s LVOT VTI:    0.163 m  AORTA Ao Root diam: 3.30 cm Ao Asc diam:  3.30 cm MITRAL VALVE               TRICUSPID VALVE MV Area (PHT): 3.31 cm    TR Peak grad:   23.8 mmHg MV Decel Time: 229 msec    TR Vmax:        244.00 cm/s MV E velocity: 97.70 cm/s MV A velocity: 87.00 cm/s  SHUNTS MV E/A ratio:  1.12        Systemic VTI:  0.16 m                            Systemic Diam: 1.80 cm Dorris Carnes MD Electronically signed by Dorris Carnes MD Signature Date/Time: 07/09/2022/6:03:45 PM    Final    DG Chest Portable 1 View  Result Date: 07/09/2022 CLINICAL DATA:  Shortness of breath. EXAM: PORTABLE CHEST 1 VIEW COMPARISON:  02/03/2022. FINDINGS: The heart is enlarged and the mediastinal contour is stable. There is a large hiatal hernia. Pulmonary vascular congestion is present. Atherosclerotic calcification of the aorta is noted. Patchy perihilar airspace disease is noted bilaterally. No effusion or pneumothorax. No acute osseous abnormality. IMPRESSION: 1. Cardiomegaly with pulmonary vascular congestion. 2. Patchy perihilar airspace disease bilaterally suggesting edema. Correlate  clinically to exclude infection. 3. Large hiatal hernia. Electronically Signed   By: Brett Fairy M.D.   On: 07/09/2022 02:16    Cardiac Studies   Echo: 07/09/2022  IMPRESSIONS     1. Left ventricular ejection fraction, by estimation, is 55 to 60%. The  left ventricle has normal function. The left ventricle has no regional  wall motion abnormalities. There is mild left ventricular hypertrophy.  Left ventricular diastolic parameters  are consistent with Grade II diastolic dysfunction (pseudonormalization).  2. Right ventricular systolic function is normal. The right ventricular  size is normal. There is normal pulmonary artery systolic pressure.   3. Left atrial size was severely dilated.   4. Mild mitral valve regurgitation.   5. The aortic valve is tricuspid. Aortic valve regurgitation is mild.   Comparison(s): The left ventricular function has improved.   FINDINGS   Left Ventricle: Left ventricular ejection fraction, by estimation, is 55  to 60%. The left ventricle has normal function. The left ventricle has no  regional wall motion abnormalities. The left ventricular internal cavity  size was normal in size. There is   mild left ventricular hypertrophy. Left ventricular diastolic parameters  are consistent with Grade II diastolic dysfunction (pseudonormalization).   Right Ventricle: The right ventricular size is normal. Right vetricular  wall thickness was not assessed. Right ventricular systolic function is  normal. There is normal pulmonary artery systolic pressure. The tricuspid  regurgitant velocity is 2.44 m/s,  and with an assumed right atrial pressure of 3 mmHg, the estimated right  ventricular systolic pressure is 0000000 mmHg.   Left Atrium: Left atrial size was severely dilated.   Right Atrium: Right atrial size was normal in size.   Pericardium: There is no evidence of pericardial effusion.   Mitral Valve: There is mild thickening of the mitral valve leaflet(s).   There is mild calcification of the mitral valve leaflet(s). Mild mitral  valve regurgitation.   Tricuspid Valve: The tricuspid valve is normal in structure. Tricuspid  valve regurgitation is trivial.   Aortic Valve: The aortic valve is tricuspid. Aortic valve regurgitation is  mild.   Pulmonic Valve: The pulmonic valve was normal in structure. Pulmonic valve  regurgitation is not visualized.   Aorta: The aortic root and ascending aorta are structurally normal, with  no evidence of dilitation.   IAS/Shunts: No atrial level shunt detected by color flow Doppler.       Patient Profile     89 y.o. female with a hx of chronic combined systolic and diastolic CHF, HTN, paroxysmal atrial fibrillation, GERD, hypothyroidism who is being seen 07/09/2022 for the evaluation of CHF at the request of Dr. Manuella Ghazi.   Assessment & Plan    HFpEF -- Echo 3/4 LVEF of 55 to 60%, no regional wall motion normality, grade 2 diastolic dysfunction, normal RV size and function, severe left atrial dilation, mild MR.  -- Has been diuresed with IV Lasix, net -1.6 L, weight to 132.9 pounds.  Has been weaned to room air.  Does have saturations with sleeping. -- GDMT: Continue metoprolol 12.5 mg twice daily, Farxiga, will add spironolactone 12.5 mg daily  Paroxysmal atrial fibrillation -- Remains in sinus rhythm -- Continue amiodarone 200 mg daily, Eliquis 5 mg twice daily  Hypertension -- Controlled -- Continue metoprolol 12.5 mg twice daily, add spironolactone 12.5 daily  For questions or updates, please contact [place] Please consult [email or web address] for contact info under        Signed, Reino Bellis, NP  07/10/2022, 10:41 AM     Patient seen and examined. Agree with assessment and plan.  Patient feels much improved.  There is no longer any shortness of breath.  Excellent diuresis at -1670 net urine output.  No JVD.  Lungs today are clear.  No wheezing or rales.  Rhythm regular with an  occasional ectopic complex, 1/6 systolic murmur.  Abdomen nontender.  No edema with brawny skin changes.  Telemetry reveals sinus rhythm in the 60s with an  occasional isolated PVC.  Creatinine 1.21.  Potassium 3.6.  Sodium 131.  Will give 20 mill equivalents of KCl.  Will add spironolactone 12.5 mg daily.   Troy Sine, MD, [facility] 07/10/2022 11:26 AM

## 2022-07-10 NOTE — Progress Notes (Signed)
SATURATION QUALIFICATIONS: (This note is used to comply with regulatory documentation for home oxygen)  Patient Saturations on Room Air at Rest = 90%  Patient Saturations on Room Air while Ambulating = 91%  Patient Saturations on 0 Liters of oxygen while Ambulating = 91%  Please briefly explain why patient needs home oxygen:

## 2022-07-10 NOTE — Evaluation (Addendum)
Occupational Therapy Evaluation Patient Details Name: Ellen Chandler MRN: [id number] DOB: 01/26/1931 Today's Date: 07/10/2022   History of Present Illness Ellen Chandler is a 89 y.o. female with medical history significant of chronic combined CHF, hypertension, hypothyroidism, paroxysmal A-fib on Eliquis presented to ED via EMS for evaluation of shortness of breath.  Oxygen saturation in the low 70s on room air and was placed on BiPAP with improvement of oxygen saturation to 90s.   Clinical Impression   Patient admitted for the diagnosis above.  PTA she lives at a local [place] where she performs her own ADL, family assist with iADL and community mobility.  Patient does walk the halls with her 4WRW.  She was not using O2 at baseline, now needing 1L to maintain O2 levels above 92%.  Patient's primary deficits is poor activity tolerance and new O2 need.  Currently she is needing generalized supervision for O2 cord management and cues for rest breaks, but not needing physical assist.  Patient is not interested in any post acute rehab, states she just finished HH.  OT will continue efforts in the acute setting to maximize functional status. OT will defer to her wishes regarding [facility] services.           Recommendations for follow up therapy are one component of a multi-disciplinary discharge planning process, led by the attending physician.  Recommendations may be updated based on patient status, additional functional criteria and insurance authorization.   Follow Up Recommendations  No OT follow up     Assistance Recommended at Discharge Set up Supervision/Assistance  Patient can return home with the following Assist for transportation;Direct supervision/assist for medications management;Direct supervision/assist for financial management    Functional Status Assessment  Patient has had a recent decline in their functional status and demonstrates the ability to make significant improvements in  function in a reasonable and predictable amount of time.  Equipment Recommendations  Tub/shower seat    Recommendations for Other Services       Precautions / Restrictions Precautions Precautions: Fall Restrictions Weight Bearing Restrictions: No      Mobility Bed Mobility Overal bed mobility: Needs Assistance Bed Mobility: Supine to Sit, Sit to Supine     Supine to sit: Supervision Sit to supine: Supervision     Patient Response: Cooperative  Transfers Overall transfer level: Needs assistance Equipment used: Rolling walker (2 wheels) Transfers: Sit to/from Stand Sit to Stand: Supervision                  Balance Overall balance assessment: Needs assistance Sitting-balance support: Feet supported Sitting balance-Leahy Scale: Good     Standing balance support: Reliant on assistive device for balance Standing balance-Leahy Scale: Fair                             ADL either performed or assessed with clinical judgement   ADL       Grooming: Supervision/safety;Standing               Lower Body Dressing: Supervision/safety;Sit to/from stand                       Vision Baseline Vision/History: 1 Wears glasses Patient Visual Report: No change from baseline       Perception     Praxis      Pertinent Vitals/Pain Pain Assessment Pain Assessment: No/denies pain     Hand Dominance Right  Extremity/Trunk Assessment Upper Extremity Assessment Upper Extremity Assessment: Overall WFL for tasks assessed   Lower Extremity Assessment Lower Extremity Assessment: Overall WFL for tasks assessed   Cervical / Trunk Assessment Cervical / Trunk Assessment: Kyphotic   Communication Communication Communication: No difficulties   Cognition Arousal/Alertness: Awake/alert Behavior During Therapy: WFL for tasks assessed/performed Overall Cognitive Status: Within Functional Limits for tasks assessed                                        General Comments  Pt received on 1L at 95%. Pt dropped to 88% on RA.    Exercises     Shoulder Instructions      Home Living Family/patient expects to be discharged to:: Private residence Living Arrangements: Alone Available Help at Discharge: Family;Available PRN/intermittently Type of Home: Independent living facility Home Access: Level entry     Home Layout: One level     Bathroom Shower/Tub: Occupational psychologist: Handicapped height Bathroom Accessibility: Yes   Home Equipment: Conservation officer, nature (2 wheels);Rollator (4 wheels);BSC/3in1;Grab bars - tub/shower;Grab bars - toilet          Prior Functioning/Environment Prior Level of Function : Independent/Modified Independent             Mobility Comments: Uses rollator for ambulation ADLs Comments: assist for cleaning 1x/month, family gets groceries and takes her to appointments, they check on her daily        OT Problem List: Decreased activity tolerance      OT Treatment/Interventions: Self-care/ADL training;Therapeutic activities;Balance training;Patient/family education;DME and/or AE instruction;Energy conservation    OT Goals(Current goals can be found in the care plan section) Acute Rehab OT Goals Patient Stated Goal: Return home tomorrow OT Goal Formulation: With patient Time For Goal Achievement: 07/24/22 Potential to Achieve Goals: Good ADL Goals Pt Will Perform Lower Body Dressing: with modified independence;sit to/from stand Pt Will Transfer to Toilet: with modified independence;ambulating;regular height toilet  OT Frequency: Min 2X/week    Co-evaluation              AM-PAC OT "6 Clicks" Daily Activity     Outcome Measure Help from another person eating meals?: None Help from another person taking care of personal grooming?: A Little Help from another person toileting, which includes using toliet, bedpan, or urinal?: A Little Help from another person  bathing (including washing, rinsing, drying)?: A Little Help from another person to put on and taking off regular upper body clothing?: None Help from another person to put on and taking off regular lower body clothing?: A Little 6 Click Score: 20   End of Session Equipment Utilized During Treatment: Rolling walker (2 wheels);Oxygen Nurse Communication: Mobility status  Activity Tolerance: Patient tolerated treatment well Patient left: in bed;with call bell/phone within reach  OT Visit Diagnosis: Unsteadiness on feet (R26.81)                Time: [id number] OT Time Calculation (min): 23 min Charges:  OT General Charges $OT Visit: 1 Visit OT Evaluation $OT Eval Moderate Complexity: 1 Mod OT Treatments $Self Care/Home Management : 8-22 mins  07/10/2022  RP, OTR/L  Acute Rehabilitation Services  Office:  [phone]   Metta Clines 07/10/2022, 4:55 PM

## 2022-07-10 NOTE — TOC Progression Note (Addendum)
Transition of Care [facility]) - Progression Note    Patient Details  Name: Ellen Chandler MRN: [id number] Date of Birth: February 12, 1931  [facility] CM/SW Contact  Zenon Mayo, RN Phone Number: 07/10/2022, 2:45 PM  Clinical Narrative:    From [facility], here with HF ex, got iv lasix yesterday, now on hold due to elevated cret.  Plan for overnight pulse oximetry per MD , patient will need oxygen at night. NCM spoke with patient offered choice for DME agency, she states she does not have a preference.  PT to see patient.          Expected Discharge Plan and Services                                               Social Determinants of Health (SDOH) Interventions SDOH Screenings   Food Insecurity: No Food Insecurity (07/09/2022)  Housing: Low Risk  (07/09/2022)  Transportation Needs: No Transportation Needs (07/09/2022)  Utilities: Not At Risk (07/09/2022)  Alcohol Screen: Low Risk  (07/10/2022)  Financial Resource Strain: Low Risk  (07/10/2022)  Tobacco Use: Low Risk  (07/09/2022)    Readmission Risk Interventions     No data to display

## 2022-07-10 NOTE — Consult Note (Signed)
   [facility] CM Inpatient Consult   07/10/2022  Ellen Chandler December 29, 1930 [id number]  [place] [facility] [ACO] Patient: [employer]  Primary Care Provider:  Deland Pretty, MD [facility]   Patient screened for 3 hospitalizations in the past 6 months with noted medium risk score for unplanned readmission risk for potential [place] Management service needs for post hospital transition for care coordination.  Review of patient's electronic medical record reveals patient is from [facility]. Met with patient at bedside states she just got back from walking and she is "pretty worn out from it." Explained reason for the rounding visit.  She expresses she is going to follow with the HF clinic. Review notes upcoming appointments and endorses her PCP.  She states no additional needs at this time.  Patient was given a 24 hour nurse advise line magnet and thanked this Probation officer for the information.   Plan:  Continue to follow progress and disposition to assess for post hospital community care coordination/management needs.  Referral request for community care coordination: no current follow up needs and for [facility] with providers noted.  Of note, [facility] Care Management/Population Health does not replace or interfere with any arrangements made by the Inpatient Transition of Care team.  For questions contact:   Natividad Brood, RN BSN [place]  [phone] business mobile phone Toll free office [phone]  *[place]  [phone] Fax number: [phone] [email or web address]

## 2022-07-10 NOTE — Progress Notes (Signed)
Nurse requested Mobility Specialist to perform oxygen saturation test with pt which includes removing pt from oxygen both at rest and while ambulating.  Below are the results from that testing.     Patient Saturations on Room Air at Rest = spO2 90%  Patient Saturations on Room Air while Ambulating = sp02 91% .  Rested and performed pursed lip breathing for 1 minute with sp02 at 91%.  At end of testing pt left in room on 0  Liters of oxygen.  Reported results to nurse.   [place] Specialist Please contact via SecureChat or Rehab office at [phone]

## 2022-07-10 NOTE — Progress Notes (Incomplete Revision)
Rounding Note    Patient Name: Ellen Chandler Date of Encounter: 07/10/2022  [facility] Cardiologist: Mertie Moores, MD   Subjective   Feeling much better this morning. Breathing has improved.   Inpatient Medications    Scheduled Meds:  amiodarone  200 mg Oral Daily   apixaban  5 mg Oral BID   dapagliflozin propanediol  10 mg Oral Daily   levothyroxine  88 mcg Oral QAC breakfast   lidocaine  1 patch Transdermal Q24H   metoprolol tartrate  12.5 mg Oral BID   mirabegron ER  50 mg Oral Daily   ondansetron (ZOFRAN) IV  4 mg Intravenous Once   pantoprazole  40 mg Oral BID   spironolactone  12.5 mg Oral Daily   Continuous Infusions:  PRN Meds: acetaminophen **OR** acetaminophen, mouth rinse   Vital Signs    Vitals:   07/10/22 0842 07/10/22 0845 07/10/22 0846 07/10/22 0847  BP:      Pulse:  68  70  Resp:    18  Temp:      TempSrc:      SpO2: 91%   91%  Weight:   60.3 kg   Height:        Intake/Output Summary (Last 24 hours) at 07/10/2022 1041 Last data filed at 07/09/2022 2100 Gross per 24 hour  Intake 360 ml  Output 1400 ml  Net -1040 ml       07/10/2022    8:46 AM 07/10/2022   12:05 AM 07/09/2022    5:51 AM  Last 3 Weights  Weight (lbs) 132 lb 15 oz 132 lb 15 oz 138 lb 10.7 oz  Weight (kg) 60.3 kg 60.3 kg 62.9 kg      Telemetry    Sinus Rhythm - Personally Reviewed  ECG    No new tracing   Physical Exam   GEN: No acute distress.   Neck: No JVD Cardiac: RRR, no murmurs, rubs, or gallops.  Respiratory: Diminished in bases GI: Soft, nontender, non-distended  MS: No edema; No deformity. Neuro:  Nonfocal  Psych: Normal affect   Labs    High Sensitivity Troponin:   Recent Labs  Lab 07/09/22 0156 07/09/22 0355  TROPONINIHS 8 10      Chemistry Recent Labs  Lab 07/09/22 0156 07/09/22 0517 07/10/22 0042  NA 134* 136 131*  K 4.9 4.7 3.6  CL 101 101 93*  CO2 '24 27 27  '$ GLUCOSE 163* 112* 121*  BUN 29* 28* 33*  CREATININE  1.11* 1.19* 1.22*  CALCIUM 8.5* 8.6* 8.7*  MG  --   --  2.2  PROT 6.0* 6.0*  --   ALBUMIN 3.1* 3.2*  --   AST 44* 34  --   ALT 22 22  --   ALKPHOS 37* 33*  --   BILITOT 1.3* 0.6  --   GFRNONAA 48* 44* 42*  ANIONGAP '9 8 11     '$ Lipids No results for input(s): "CHOL", "TRIG", "HDL", "LABVLDL", "LDLCALC", "CHOLHDL" in the last 168 hours.  Hematology Recent Labs  Lab 07/09/22 0156 07/09/22 0517 07/10/22 0042  WBC 7.4 10.6* 10.8*  RBC 4.72 4.65 4.08  HGB 15.4* 15.2* 13.0  HCT 45.5 44.5 38.9  MCV 96.4 95.7 95.3  MCH 32.6 32.7 31.9  MCHC 33.8 34.2 33.4  RDW 13.8 13.8 13.6  PLT 322 305 287    Thyroid  Recent Labs  Lab 07/09/22 0517  TSH 3.110     BNP Recent Labs  Lab  07/09/22 0156  BNP 484.1*     DDimer No results for input(s): "DDIMER" in the last 168 hours.   Radiology    ECHOCARDIOGRAM COMPLETE  Result Date: 07/09/2022    ECHOCARDIOGRAM REPORT   Patient Name:   Ellen Chandler Date of Exam: 07/09/2022 Medical Rec #:  [id number]         Height:       65.0 in Accession #:    [id number]        Weight:       138.7 lb Date of Birth:  June 26, 1930         BSA:          1.693 m Patient Age:    89 years          BP:           111/65 mmHg Patient Gender: F                 HR:           54 bpm. Exam Location:  Inpatient Procedure: 2D Echo, Cardiac Doppler and Color Doppler Indications:    acute systolic chf  History:        Patient has prior history of Echocardiogram examinations, most                 recent 01/04/2022. CHF, Arrythmias:Atrial Fibrillation; Risk                 Factors:Hypertension.  Sonographer:    Johny Chess RDCS Referring Phys: [id number] RATHORE  Sonographer Comments: Image acquisition challenging due to respiratory motion. IMPRESSIONS  1. Left ventricular ejection fraction, by estimation, is 55 to 60%. The left ventricle has normal function. The left ventricle has no regional wall motion abnormalities. There is mild left ventricular hypertrophy. Left  ventricular diastolic parameters are consistent with Grade II diastolic dysfunction (pseudonormalization).  2. Right ventricular systolic function is normal. The right ventricular size is normal. There is normal pulmonary artery systolic pressure.  3. Left atrial size was severely dilated.  4. Mild mitral valve regurgitation.  5. The aortic valve is tricuspid. Aortic valve regurgitation is mild. Comparison(s): The left ventricular function has improved. FINDINGS  Left Ventricle: Left ventricular ejection fraction, by estimation, is 55 to 60%. The left ventricle has normal function. The left ventricle has no regional wall motion abnormalities. The left ventricular internal cavity size was normal in size. There is  mild left ventricular hypertrophy. Left ventricular diastolic parameters are consistent with Grade II diastolic dysfunction (pseudonormalization). Right Ventricle: The right ventricular size is normal. Right vetricular wall thickness was not assessed. Right ventricular systolic function is normal. There is normal pulmonary artery systolic pressure. The tricuspid regurgitant velocity is 2.44 m/s, and with an assumed right atrial pressure of 3 mmHg, the estimated right ventricular systolic pressure is 0000000 mmHg. Left Atrium: Left atrial size was severely dilated. Right Atrium: Right atrial size was normal in size. Pericardium: There is no evidence of pericardial effusion. Mitral Valve: There is mild thickening of the mitral valve leaflet(s). There is mild calcification of the mitral valve leaflet(s). Mild mitral valve regurgitation. Tricuspid Valve: The tricuspid valve is normal in structure. Tricuspid valve regurgitation is trivial. Aortic Valve: The aortic valve is tricuspid. Aortic valve regurgitation is mild. Pulmonic Valve: The pulmonic valve was normal in structure. Pulmonic valve regurgitation is not visualized. Aorta: The aortic root and ascending aorta are structurally normal, with no evidence of  dilitation. IAS/Shunts: No atrial  level shunt detected by color flow Doppler.  LEFT VENTRICLE PLAX 2D LVIDd:         4.20 cm   Diastology LVIDs:         2.90 cm   LV e' medial:    4.90 cm/s LV PW:         1.00 cm   LV E/e' medial:  19.9 LV IVS:        1.20 cm   LV e' lateral:   7.94 cm/s LVOT diam:     1.80 cm   LV E/e' lateral: 12.3 LV SV:         41 LV SV Index:   24 LVOT Area:     2.54 cm  RIGHT VENTRICLE             IVC RV Basal diam:  2.60 cm     IVC diam: 1.40 cm RV S prime:     10.30 cm/s TAPSE (M-mode): 2.6 cm LEFT ATRIUM              Index        RIGHT ATRIUM           Index LA diam:        4.00 cm  2.36 cm/m   RA Area:     12.40 cm LA Vol (A2C):   52.0 ml  30.71 ml/m  RA Volume:   24.70 ml  14.59 ml/m LA Vol (A4C):   110.0 ml 64.97 ml/m LA Biplane Vol: 75.9 ml  44.83 ml/m  AORTIC VALVE LVOT Vmax:   89.00 cm/s LVOT Vmean:  54.800 cm/s LVOT VTI:    0.163 m  AORTA Ao Root diam: 3.30 cm Ao Asc diam:  3.30 cm MITRAL VALVE               TRICUSPID VALVE MV Area (PHT): 3.31 cm    TR Peak grad:   23.8 mmHg MV Decel Time: 229 msec    TR Vmax:        244.00 cm/s MV E velocity: 97.70 cm/s MV A velocity: 87.00 cm/s  SHUNTS MV E/A ratio:  1.12        Systemic VTI:  0.16 m                            Systemic Diam: 1.80 cm Dorris Carnes MD Electronically signed by Dorris Carnes MD Signature Date/Time: 07/09/2022/6:03:45 PM    Final    DG Chest Portable 1 View  Result Date: 07/09/2022 CLINICAL DATA:  Shortness of breath. EXAM: PORTABLE CHEST 1 VIEW COMPARISON:  02/03/2022. FINDINGS: The heart is enlarged and the mediastinal contour is stable. There is a large hiatal hernia. Pulmonary vascular congestion is present. Atherosclerotic calcification of the aorta is noted. Patchy perihilar airspace disease is noted bilaterally. No effusion or pneumothorax. No acute osseous abnormality. IMPRESSION: 1. Cardiomegaly with pulmonary vascular congestion. 2. Patchy perihilar airspace disease bilaterally suggesting edema. Correlate  clinically to exclude infection. 3. Large hiatal hernia. Electronically Signed   By: Brett Fairy M.D.   On: 07/09/2022 02:16    Cardiac Studies   Echo: 07/09/2022  IMPRESSIONS     1. Left ventricular ejection fraction, by estimation, is 55 to 60%. The  left ventricle has normal function. The left ventricle has no regional  wall motion abnormalities. There is mild left ventricular hypertrophy.  Left ventricular diastolic parameters  are consistent with Grade II diastolic dysfunction (pseudonormalization).  2. Right ventricular systolic function is normal. The right ventricular  size is normal. There is normal pulmonary artery systolic pressure.   3. Left atrial size was severely dilated.   4. Mild mitral valve regurgitation.   5. The aortic valve is tricuspid. Aortic valve regurgitation is mild.   Comparison(s): The left ventricular function has improved.   FINDINGS   Left Ventricle: Left ventricular ejection fraction, by estimation, is 55  to 60%. The left ventricle has normal function. The left ventricle has no  regional wall motion abnormalities. The left ventricular internal cavity  size was normal in size. There is   mild left ventricular hypertrophy. Left ventricular diastolic parameters  are consistent with Grade II diastolic dysfunction (pseudonormalization).   Right Ventricle: The right ventricular size is normal. Right vetricular  wall thickness was not assessed. Right ventricular systolic function is  normal. There is normal pulmonary artery systolic pressure. The tricuspid  regurgitant velocity is 2.44 m/s,  and with an assumed right atrial pressure of 3 mmHg, the estimated right  ventricular systolic pressure is 0000000 mmHg.   Left Atrium: Left atrial size was severely dilated.   Right Atrium: Right atrial size was normal in size.   Pericardium: There is no evidence of pericardial effusion.   Mitral Valve: There is mild thickening of the mitral valve leaflet(s).   There is mild calcification of the mitral valve leaflet(s). Mild mitral  valve regurgitation.   Tricuspid Valve: The tricuspid valve is normal in structure. Tricuspid  valve regurgitation is trivial.   Aortic Valve: The aortic valve is tricuspid. Aortic valve regurgitation is  mild.   Pulmonic Valve: The pulmonic valve was normal in structure. Pulmonic valve  regurgitation is not visualized.   Aorta: The aortic root and ascending aorta are structurally normal, with  no evidence of dilitation.   IAS/Shunts: No atrial level shunt detected by color flow Doppler.       Patient Profile     89 y.o. female with a hx of chronic combined systolic and diastolic CHF, HTN, paroxysmal atrial fibrillation, GERD, hypothyroidism who is being seen 07/09/2022 for the evaluation of CHF at the request of Dr. Manuella Ghazi.   Assessment & Plan    HFpEF -- Echo 3/4 LVEF of 55 to 60%, no regional wall motion normality, grade 2 diastolic dysfunction, normal RV size and function, severe left atrial dilation, mild MR.  -- Has been diuresed with IV Lasix, net -1.6 L, weight to 132.9 pounds.  Has been weaned to room air.  Does have saturations with sleeping. -- GDMT: Continue metoprolol 12.5 mg twice daily, Farxiga, will add spironolactone 12.5 mg daily  Paroxysmal atrial fibrillation -- Remains in sinus rhythm -- Continue amiodarone 200 mg daily, Eliquis 5 mg twice daily  Hypertension -- Controlled -- Continue metoprolol 12.5 mg twice daily, add spironolactone 12.5 daily  For questions or updates, please contact [place] Please consult [email or web address] for contact info under        Signed, Reino Bellis, NP  07/10/2022, 10:41 AM     Patient seen and examined. Agree with assessment and plan.  Patient feels much improved.  There is no longer any shortness of breath.  Excellent diuresis at -1670 net urine output.  No JVD.  Lungs today are clear.  No wheezing or rales.  Rhythm regular with an  occasional ectopic complex, 1/6 systolic murmur.  Abdomen nontender.  No edema with brawny skin changes.  Telemetry reveals sinus rhythm in the 60s with an  occasional isolated PVC.  Creatinine 1.21.  Potassium 3.6.  Sodium 131.  Will give 20 mill equivalents of KCl.  Will add spironolactone 12.5 mg daily.   Troy Sine, MD, [facility] 07/10/2022 11:26 AM

## 2022-07-11 ENCOUNTER — Other Ambulatory Visit (HOSPITAL_COMMUNITY): Payer: Self-pay

## 2022-07-11 DIAGNOSIS — I5033 Acute on chronic diastolic (congestive) heart failure: Secondary | ICD-10-CM

## 2022-07-11 LAB — CBC
HCT: 37.7 % (ref 36.0–46.0)
Hemoglobin: 12.9 g/dL (ref 12.0–15.0)
MCH: 32.3 pg (ref 26.0–34.0)
MCHC: 34.2 g/dL (ref 30.0–36.0)
MCV: 94.3 fL (ref 80.0–100.0)
Platelets: 279 10*3/uL (ref 150–400)
RBC: 4 MIL/uL (ref 3.87–5.11)
RDW: 13.5 % (ref 11.5–15.5)
WBC: 9.2 10*3/uL (ref 4.0–10.5)
nRBC: 0 % (ref 0.0–0.2)

## 2022-07-11 LAB — BASIC METABOLIC PANEL
Anion gap: 10 (ref 5–15)
BUN: 30 mg/dL — ABNORMAL HIGH (ref 8–23)
CO2: 25 mmol/L (ref 22–32)
Calcium: 8.9 mg/dL (ref 8.9–10.3)
Chloride: 96 mmol/L — ABNORMAL LOW (ref 98–111)
Creatinine, Ser: 1.09 mg/dL — ABNORMAL HIGH (ref 0.44–1.00)
GFR, Estimated: 49 mL/min — ABNORMAL LOW (ref >60)
Glucose, Bld: 149 mg/dL — ABNORMAL HIGH (ref 70–99)
Potassium: 4 mmol/L (ref 3.5–5.1)
Sodium: 131 mmol/L — ABNORMAL LOW (ref 135–145)

## 2022-07-11 LAB — MAGNESIUM: Magnesium: 2 mg/dL (ref 1.7–2.4)

## 2022-07-11 MED ORDER — METOPROLOL TARTRATE 25 MG PO TABS
12.5000 mg | ORAL_TABLET | Freq: Two times a day (BID) | ORAL | 0 refills | Status: DC
  Filled 2022-07-11: qty 30, 30d supply, fill #0

## 2022-07-11 MED ORDER — SPIRONOLACTONE 25 MG PO TABS
12.5000 mg | ORAL_TABLET | Freq: Every day | ORAL | 0 refills | Status: DC
  Filled 2022-07-11: qty 15, 30d supply, fill #0

## 2022-07-11 MED ORDER — FUROSEMIDE 20 MG PO TABS
20.0000 mg | ORAL_TABLET | Freq: Every day | ORAL | 0 refills | Status: DC
  Filled 2022-07-11: qty 30, 30d supply, fill #0

## 2022-07-11 NOTE — TOC Progression Note (Signed)
Transition of Care [facility]) - Progression Note    Patient Details  Name: Ellen Chandler MRN: [id number] Date of Birth: 10-Dec-1930  [facility] CM/SW Contact  Zenon Mayo, RN Phone Number: 07/11/2022, 11:55 AM  Clinical Narrative:    NCM spoke with patient , offered choice for HHPT, she states she does not want HHPT she already had it, also she state she has a nice shower and she does not need a tub bench.  She states her son will transport her home when she is dc.  Awaiting order for oxygen.  An overnight pulse ox was done.          Expected Discharge Plan and Services                                               Social Determinants of Health (SDOH) Interventions SDOH Screenings   Food Insecurity: No Food Insecurity (07/09/2022)  Housing: Low Risk  (07/09/2022)  Transportation Needs: No Transportation Needs (07/09/2022)  Utilities: Not At Risk (07/09/2022)  Alcohol Screen: Low Risk  (07/10/2022)  Financial Resource Strain: Low Risk  (07/10/2022)  Tobacco Use: Low Risk  (07/09/2022)    Readmission Risk Interventions     No data to display

## 2022-07-11 NOTE — TOC Transition Note (Signed)
Transition of Care [facility]) - CM/SW Discharge Note   Patient Details  Name: Ellen Chandler MRN: [id number] Date of Birth: 10/22/1930  [facility]) CM/SW Contact:  Zenon Mayo, RN Phone Number: 07/11/2022, 12:10 PM   Clinical Narrative:    NCM is for dc today, an overnight pulse oximetry was done but she did not desat below 89 , so she does not qualify for oxygen .  Patient states her son will transport her home.  NCM offered choice for HHPT, she states she does not want it she already did this per patient.  She also states she does not want a tub bench.     Final next level of care: Home/Self Care Barriers to Discharge: No Barriers Identified   Patient Goals and CMS Choice CMS Medicare.gov Compare Post Acute Care list provided to:: Patient Choice offered to / list presented to : Patient  Discharge Placement                         Discharge Plan and Services Additional resources added to the After Visit Summary for                  DME Arranged: Oxygen DME Agency: [employer] Date DME Agency Contacted: 07/11/22 Time DME Agency Contacted: [phone] Representative spoke with at DME Agency: Cyril Mourning HH Arranged: Patient Refused Toyah, PT          Social Determinants of Health ([place]) Interventions SDOH Screenings   Food Insecurity: No Food Insecurity (07/09/2022)  Housing: Low Risk  (07/09/2022)  Transportation Needs: No Transportation Needs (07/09/2022)  Utilities: Not At Risk (07/09/2022)  Alcohol Screen: Low Risk  (07/10/2022)  Financial Resource Strain: Low Risk  (07/10/2022)  Tobacco Use: Low Risk  (07/09/2022)     Readmission Risk Interventions     No data to display

## 2022-07-11 NOTE — Assessment & Plan Note (Signed)
Continue rate control with metoprolol and anticoagulation with apixaban.

## 2022-07-11 NOTE — Plan of Care (Signed)

## 2022-07-11 NOTE — Discharge Summary (Signed)
Physician Discharge Summary   Patient: Ellen Chandler MRN: [id number] DOB: 1931/02/13  Admit date:     07/09/2022  Discharge date: 07/11/22  Discharge Physician: Jimmy Picket Yitzchak Kothari   PCP: Deland Pretty, MD   Recommendations at discharge:   Furosemide changed from as needed to daily 20 mg and added spironolactone.  Decreased metoprolol to 12,5 mg po bid. Follow up renal function and electrolytes in 7 days. Follow up with Dr Shelia Media in 7 to 10 days. Follow up with Cardiology as scheduled.   Discharge Diagnoses: Principal Problem:   Acute on chronic diastolic CHF (congestive heart failure) (HCC) Active Problems:   Essential hypertension   Paroxysmal A-fib (HCC)   Hyponatremia   Hypothyroidism  Resolved Problems:   * No resolved hospital problems. [facility] Course: Mrs. Ellen Chandler was admitted to the hospital with the working diagnosis of decompensated heart failure.   89 yo female with the past medical history of heart failure, hypertension, and paroxysmal atrial fibrillation who presented with dyspnea. Rapid onset of worsening dyspnea. EMS was called and she was found hypoxemic  low 70's and in respiratory distress. In the ED she was placed on Bipap, blood pressure 160/91. HR 63, RR 25 and 02 saturation 91%, she had rales bilaterally, heart with S1 and S2 present and bradycardic, abdomen with no distention, and no lower extremity edema.   Na 136, K 4,7 CL 101 bicarbonate 27, glucose 112, bun 28 cr 1,19 Wbc 10,6 hgb 15.2 plt 305  Sars covid 19 negative.  Influenza negative   Chest radiograph with bilateral hilar vascular congestion, bilateral interstitial and alveolar infiltrates centrally and symmetrical, no effusions. Large hiatal hernia.   EKG 66 bpm, normal axis, normal intervals, sinus rhythm with positive PVC, no significant ST segment changes, negative T wave lead II, III, AvF, lead V1 and V2.   Patient was placed on furosemide for diuresis with improvement in her  volume status.   Assessment and Plan: * Acute on chronic diastolic CHF (congestive heart failure) (HCC) Echocardiogram with preserved LV systolic function EF 55 to 60%, mild LVH, RV systolic function preserved. LA with severe dilatation. No significant valvular disease.   Patient was placed on furosemide for diuresis, negative fluid balance was achieved, - 2,250 ml, and 4 kg weight loss, with significant improvement in her symptoms.   Patient will continue dapagliflozin, metoprolol and spironolactone.  Diuresis with furosemide.   Essential hypertension Continue blood pressure control with metoprolol.   Paroxysmal A-fib (HCC) Continue rate control with metoprolol and anticoagulation with apixaban.   Hyponatremia Renal function with serum cr at 1,0 with K at 4,0 and serum bicarbonate at 25. Na 131 and Mg 2,0  Plan to continue diuresis with furosemide, spironolactone and SGLT 2 inh.  Follow up renal function and electrolytes in am.   Hypothyroidism Continue levothyroxine.          Consultants: cardiology  Procedures performed: none   Disposition: Home Diet recommendation:  Discharge Diet Orders (From admission, onward)     Start     Ordered   07/11/22 0000  Diet - low sodium heart healthy        07/11/22 1236           Cardiac diet DISCHARGE MEDICATION: Allergies as of 07/11/2022   No Known Allergies      Medication List     TAKE these medications    acetaminophen 500 MG tablet Commonly known as: TYLENOL Take 1,000 mg by mouth every 6 (six)  hours as needed for moderate pain.   amiodarone 200 MG tablet Commonly known as: PACERONE Take 1 tablet (200 mg total) by mouth daily.   apixaban 5 MG Tabs tablet Commonly known as: ELIQUIS Take 1 tablet (5 mg total) by mouth 2 (two) times daily.   Farxiga 10 MG Tabs tablet Generic drug: dapagliflozin propanediol Take 10 mg by mouth daily.   furosemide 20 MG tablet Commonly known as: LASIX Take 1 tablet (20  mg total) by mouth daily. What changed:  when to take this reasons to take this   lactose free nutrition Liqd Take 237 mLs by mouth at bedtime.   levothyroxine 88 MCG tablet Commonly known as: SYNTHROID Take 88 mcg by mouth daily before breakfast.   metoprolol tartrate 25 MG tablet Commonly known as: LOPRESSOR Take 0.5 tablets (12.5 mg total) by mouth 2 (two) times daily. What changed: how much to take   multivitamin with minerals Tabs tablet Take 1 tablet by mouth daily.   Myrbetriq 50 MG Tb24 tablet Generic drug: mirabegron ER Take 50 mg by mouth daily.   pantoprazole 40 MG tablet Commonly known as: PROTONIX Take 40 mg by mouth 2 (two) times daily.   spironolactone 25 MG tablet Commonly known as: ALDACTONE Take 0.5 tablets (12.5 mg total) by mouth daily. Start taking on: July 12, 2022        Follow-up Information     [facility]. Go in 9 day(s).   Specialty: Cardiology Why: Hospital follow up 07/20/2022 @ 9:30 am PLEASE bring a current medication list to appointment FREE valet parking, Entrance C, off [employer] information: [address] [id number] [place] [phone]               Discharge Exam: Filed Weights   07/10/22 0846 07/11/22 0052 07/11/22 0500  Weight: 60.3 kg 64 kg 64 kg   BP 117/62 (BP Location: Left Arm)   Pulse 68   Temp 98 F (36.7 C) (Oral)   Resp (!) 22   Ht '5\' 5"'$  (1.651 m)   Wt 64 kg   SpO2 90%   BMI 23.48 kg/m   Patient feeling better, no dyspnea or edema  Neurology awake and alert ENT with mild pallor  Cardiovascular with S1 and S2 present and rhythmic with no gallops or rubs No JVD No lower extremity edema Respiratory with no rales or wheezing, no rhonchi Abdomen with no distention   Condition at discharge: stable  The results of significant diagnostics from this hospitalization (including imaging, microbiology,  ancillary and laboratory) are listed below for reference.   Imaging Studies: ECHOCARDIOGRAM COMPLETE  Result Date: 07/09/2022    ECHOCARDIOGRAM REPORT   Patient Name:   Ellen Chandler Date of Exam: 07/09/2022 Medical Rec #:  [id number]         Height:       65.0 in Accession #:    [id number]        Weight:       138.7 lb Date of Birth:  May 19, 1930         BSA:          1.693 m Patient Age:    89 years          BP:           111/65 mmHg Patient Gender: F                 HR:  54 bpm. Exam Location:  Inpatient Procedure: 2D Echo, Cardiac Doppler and Color Doppler Indications:    acute systolic chf  History:        Patient has prior history of Echocardiogram examinations, most                 recent 01/04/2022. CHF, Arrythmias:Atrial Fibrillation; Risk                 Factors:Hypertension.  Sonographer:    Johny Chess RDCS Referring Phys: [id number] RATHORE  Sonographer Comments: Image acquisition challenging due to respiratory motion. IMPRESSIONS  1. Left ventricular ejection fraction, by estimation, is 55 to 60%. The left ventricle has normal function. The left ventricle has no regional wall motion abnormalities. There is mild left ventricular hypertrophy. Left ventricular diastolic parameters are consistent with Grade II diastolic dysfunction (pseudonormalization).  2. Right ventricular systolic function is normal. The right ventricular size is normal. There is normal pulmonary artery systolic pressure.  3. Left atrial size was severely dilated.  4. Mild mitral valve regurgitation.  5. The aortic valve is tricuspid. Aortic valve regurgitation is mild. Comparison(s): The left ventricular function has improved. FINDINGS  Left Ventricle: Left ventricular ejection fraction, by estimation, is 55 to 60%. The left ventricle has normal function. The left ventricle has no regional wall motion abnormalities. The left ventricular internal cavity size was normal in size. There is  mild left ventricular  hypertrophy. Left ventricular diastolic parameters are consistent with Grade II diastolic dysfunction (pseudonormalization). Right Ventricle: The right ventricular size is normal. Right vetricular wall thickness was not assessed. Right ventricular systolic function is normal. There is normal pulmonary artery systolic pressure. The tricuspid regurgitant velocity is 2.44 m/s, and with an assumed right atrial pressure of 3 mmHg, the estimated right ventricular systolic pressure is 0000000 mmHg. Left Atrium: Left atrial size was severely dilated. Right Atrium: Right atrial size was normal in size. Pericardium: There is no evidence of pericardial effusion. Mitral Valve: There is mild thickening of the mitral valve leaflet(s). There is mild calcification of the mitral valve leaflet(s). Mild mitral valve regurgitation. Tricuspid Valve: The tricuspid valve is normal in structure. Tricuspid valve regurgitation is trivial. Aortic Valve: The aortic valve is tricuspid. Aortic valve regurgitation is mild. Pulmonic Valve: The pulmonic valve was normal in structure. Pulmonic valve regurgitation is not visualized. Aorta: The aortic root and ascending aorta are structurally normal, with no evidence of dilitation. IAS/Shunts: No atrial level shunt detected by color flow Doppler.  LEFT VENTRICLE PLAX 2D LVIDd:         4.20 cm   Diastology LVIDs:         2.90 cm   LV e' medial:    4.90 cm/s LV PW:         1.00 cm   LV E/e' medial:  19.9 LV IVS:        1.20 cm   LV e' lateral:   7.94 cm/s LVOT diam:     1.80 cm   LV E/e' lateral: 12.3 LV SV:         41 LV SV Index:   24 LVOT Area:     2.54 cm  RIGHT VENTRICLE             IVC RV Basal diam:  2.60 cm     IVC diam: 1.40 cm RV S prime:     10.30 cm/s TAPSE (M-mode): 2.6 cm LEFT ATRIUM  Index        RIGHT ATRIUM           Index LA diam:        4.00 cm  2.36 cm/m   RA Area:     12.40 cm LA Vol (A2C):   52.0 ml  30.71 ml/m  RA Volume:   24.70 ml  14.59 ml/m LA Vol (A4C):   110.0  ml 64.97 ml/m LA Biplane Vol: 75.9 ml  44.83 ml/m  AORTIC VALVE LVOT Vmax:   89.00 cm/s LVOT Vmean:  54.800 cm/s LVOT VTI:    0.163 m  AORTA Ao Root diam: 3.30 cm Ao Asc diam:  3.30 cm MITRAL VALVE               TRICUSPID VALVE MV Area (PHT): 3.31 cm    TR Peak grad:   23.8 mmHg MV Decel Time: 229 msec    TR Vmax:        244.00 cm/s MV E velocity: 97.70 cm/s MV A velocity: 87.00 cm/s  SHUNTS MV E/A ratio:  1.12        Systemic VTI:  0.16 m                            Systemic Diam: 1.80 cm Dorris Carnes MD Electronically signed by Dorris Carnes MD Signature Date/Time: 07/09/2022/6:03:45 PM    Final    DG Chest Portable 1 View  Result Date: 07/09/2022 CLINICAL DATA:  Shortness of breath. EXAM: PORTABLE CHEST 1 VIEW COMPARISON:  02/03/2022. FINDINGS: The heart is enlarged and the mediastinal contour is stable. There is a large hiatal hernia. Pulmonary vascular congestion is present. Atherosclerotic calcification of the aorta is noted. Patchy perihilar airspace disease is noted bilaterally. No effusion or pneumothorax. No acute osseous abnormality. IMPRESSION: 1. Cardiomegaly with pulmonary vascular congestion. 2. Patchy perihilar airspace disease bilaterally suggesting edema. Correlate clinically to exclude infection. 3. Large hiatal hernia. Electronically Signed   By: Brett Fairy M.D.   On: 07/09/2022 02:16    Microbiology: Results for orders placed or performed during the hospital encounter of 07/09/22  Resp panel by RT-PCR (RSV, Flu A&B, Covid) Anterior Nasal Swab     Status: None   Collection Time: 07/09/22  3:47 AM   Specimen: Anterior Nasal Swab  Result Value Ref Range Status   SARS Coronavirus 2 by RT PCR NEGATIVE NEGATIVE Final   Influenza A by PCR NEGATIVE NEGATIVE Final   Influenza B by PCR NEGATIVE NEGATIVE Final    Comment: (NOTE) The Xpert Xpress SARS-CoV-2/FLU/RSV plus assay is intended as an aid in the diagnosis of influenza from Nasopharyngeal swab specimens and should not be used as a  sole basis for treatment. Nasal washings and aspirates are unacceptable for Xpert Xpress SARS-CoV-2/FLU/RSV testing.  Fact Sheet for Patients: EntrepreneurPulse.com.au  Fact Sheet for Healthcare Providers: IncredibleEmployment.be  This test is not yet approved or cleared by the [place] FDA and has been authorized for detection and/or diagnosis of SARS-CoV-2 by FDA under an Emergency Use Authorization (EUA). This EUA will remain in effect (meaning this test can be used) for the duration of the COVID-19 declaration under Section 564(b)(1) of the Act, 21 U.S.C. section 360bbb-3(b)(1), unless the authorization is terminated or revoked.     Resp Syncytial Virus by PCR NEGATIVE NEGATIVE Final    Comment: (NOTE) Fact Sheet for Patients: EntrepreneurPulse.com.au  Fact Sheet for Healthcare Providers: IncredibleEmployment.be  This test is not  yet approved or cleared by the [place] and has been authorized for detection and/or diagnosis of SARS-CoV-2 by FDA under an Emergency Use Authorization (EUA). This EUA will remain in effect (meaning this test can be used) for the duration of the COVID-19 declaration under Section 564(b)(1) of the Act, 21 U.S.C. section 360bbb-3(b)(1), unless the authorization is terminated or revoked.  Performed at [facility], [place] [address] [place] [employer], [place] [address]     Labs: CBC: Recent Labs  Lab 07/09/22 [phone] 07/09/22 0517 07/10/22 0042 07/11/22 0050  WBC 7.4 10.6* 10.8* 9.2  NEUTROABS 4.3  --   --   --   HGB 15.4* 15.2* 13.0 12.9  HCT 45.5 44.5 38.9 37.7  MCV 96.4 95.7 95.3 94.3  PLT 322 305 287 [id number]   Basic Metabolic Panel: Recent Labs  Lab 07/09/22 0156 07/09/22 0517 07/10/22 0042 07/11/22 0050  NA 134* 136 131* 131*  K 4.9 4.7 3.6 4.0  CL 101 101 93* 96*  CO2 '24 27 27 25  '$ GLUCOSE 163* 112* 121* 149*  BUN 29* 28* 33* 30*  CREATININE 1.11*  1.19* 1.22* 1.09*  CALCIUM 8.5* 8.6* 8.7* 8.9  MG  --   --  2.2 2.0   Liver Function Tests: Recent Labs  Lab 07/09/22 0156 07/09/22 0517  AST 44* 34  ALT 22 22  ALKPHOS 37* 33*  BILITOT 1.3* 0.6  PROT 6.0* 6.0*  ALBUMIN 3.1* 3.2*   CBG: No results for input(s): "GLUCAP" in the last 168 hours.  Discharge time spent: greater than 30 minutes.  Signed: Tawni Millers, MD [facility] 07/11/2022

## 2022-07-11 NOTE — Progress Notes (Addendum)
Rounding Note    Patient Name: Ellen Chandler Date of Encounter: 07/11/2022  [place] Cardiologist: Mertie Moores, MD   Subjective   Feeling well this morning. Breathing has improved  Inpatient Medications    Scheduled Meds:  amiodarone  200 mg Oral Daily   apixaban  5 mg Oral BID   dapagliflozin propanediol  10 mg Oral Daily   levothyroxine  88 mcg Oral QAC breakfast   lidocaine  1 patch Transdermal Q24H   metoprolol tartrate  12.5 mg Oral BID   mirabegron ER  50 mg Oral Daily   ondansetron (ZOFRAN) IV  4 mg Intravenous Once   pantoprazole  40 mg Oral BID   spironolactone  12.5 mg Oral Daily   Continuous Infusions:  PRN Meds: acetaminophen **OR** acetaminophen, mouth rinse   Vital Signs    Vitals:   07/11/22 0433 07/11/22 0500 07/11/22 0730 07/11/22 0756  BP: 131/66  (!) 140/62   Pulse: 60  66 72  Resp: 20 19 (!) 21 (!) 21  Temp: 97.8 F (36.6 C)  97.8 F (36.6 C)   TempSrc: Oral  Oral   SpO2: 96%  95% 92%  Weight:  64 kg    Height:        Intake/Output Summary (Last 24 hours) at 07/11/2022 1031 Last data filed at 07/11/2022 0805 Gross per 24 hour  Intake 490 ml  Output 1250 ml  Net -760 ml      07/11/2022    5:00 AM 07/11/2022   12:52 AM 07/10/2022    8:46 AM  Last 3 Weights  Weight (lbs) 141 lb 1.5 oz 141 lb 1.5 oz 132 lb 15 oz  Weight (kg) 64 kg 64 kg 60.3 kg      Telemetry    Sinus Rhythm - Personally Reviewed  ECG    No new tracing  Physical Exam   GEN: No acute distress.   Neck: No JVD Cardiac: RRR, no murmurs, rubs, or gallops.  Respiratory: Clear to auscultation bilaterally. GI: Soft, nontender, non-distended  MS: No edema; No deformity. Neuro:  Nonfocal  Psych: Normal affect   Labs    High Sensitivity Troponin:   Recent Labs  Lab 07/09/22 0156 07/09/22 0355  TROPONINIHS 8 10     Chemistry Recent Labs  Lab 07/09/22 0156 07/09/22 0517 07/10/22 0042 07/11/22 0050  NA 134* 136 131* 131*  K 4.9 4.7 3.6  4.0  CL 101 101 93* 96*  CO2 '24 27 27 25  '$ GLUCOSE 163* 112* 121* 149*  BUN 29* 28* 33* 30*  CREATININE 1.11* 1.19* 1.22* 1.09*  CALCIUM 8.5* 8.6* 8.7* 8.9  MG  --   --  2.2 2.0  PROT 6.0* 6.0*  --   --   ALBUMIN 3.1* 3.2*  --   --   AST 44* 34  --   --   ALT 22 22  --   --   ALKPHOS 37* 33*  --   --   BILITOT 1.3* 0.6  --   --   GFRNONAA 48* 44* 42* 49*  ANIONGAP '9 8 11 10    '$ Lipids No results for input(s): "CHOL", "TRIG", "HDL", "LABVLDL", "LDLCALC", "CHOLHDL" in the last 168 hours.  Hematology Recent Labs  Lab 07/09/22 0517 07/10/22 0042 07/11/22 0050  WBC 10.6* 10.8* 9.2  RBC 4.65 4.08 4.00  HGB 15.2* 13.0 12.9  HCT 44.5 38.9 37.7  MCV 95.7 95.3 94.3  MCH 32.7 31.9 32.3  MCHC 34.2  33.4 34.2  RDW 13.8 13.6 13.5  PLT 305 287 279   Thyroid  Recent Labs  Lab 07/09/22 0517  TSH 3.110    BNP Recent Labs  Lab 07/09/22 0156  BNP 484.1*    DDimer No results for input(s): "DDIMER" in the last 168 hours.   Radiology    ECHOCARDIOGRAM COMPLETE  Result Date: 07/09/2022    ECHOCARDIOGRAM REPORT   Patient Name:   ANAYELI FELICIANO Savarino Date of Exam: 07/09/2022 Medical Rec #:  [id number]         Height:       65.0 in Accession #:    [id number]        Weight:       138.7 lb Date of Birth:  1931/04/02         BSA:          1.693 m Patient Age:    43 years          BP:           111/65 mmHg Patient Gender: F                 HR:           54 bpm. Exam Location:  Inpatient Procedure: 2D Echo, Cardiac Doppler and Color Doppler Indications:    acute systolic chf  History:        Patient has prior history of Echocardiogram examinations, most                 recent 01/04/2022. CHF, Arrythmias:Atrial Fibrillation; Risk                 Factors:Hypertension.  Sonographer:    Johny Chess RDCS Referring Phys: [id number] RATHORE  Sonographer Comments: Image acquisition challenging due to respiratory motion. IMPRESSIONS  1. Left ventricular ejection fraction, by estimation, is 55 to 60%.  The left ventricle has normal function. The left ventricle has no regional wall motion abnormalities. There is mild left ventricular hypertrophy. Left ventricular diastolic parameters are consistent with Grade II diastolic dysfunction (pseudonormalization).  2. Right ventricular systolic function is normal. The right ventricular size is normal. There is normal pulmonary artery systolic pressure.  3. Left atrial size was severely dilated.  4. Mild mitral valve regurgitation.  5. The aortic valve is tricuspid. Aortic valve regurgitation is mild. Comparison(s): The left ventricular function has improved. FINDINGS  Left Ventricle: Left ventricular ejection fraction, by estimation, is 55 to 60%. The left ventricle has normal function. The left ventricle has no regional wall motion abnormalities. The left ventricular internal cavity size was normal in size. There is  mild left ventricular hypertrophy. Left ventricular diastolic parameters are consistent with Grade II diastolic dysfunction (pseudonormalization). Right Ventricle: The right ventricular size is normal. Right vetricular wall thickness was not assessed. Right ventricular systolic function is normal. There is normal pulmonary artery systolic pressure. The tricuspid regurgitant velocity is 2.44 m/s, and with an assumed right atrial pressure of 3 mmHg, the estimated right ventricular systolic pressure is 0000000 mmHg. Left Atrium: Left atrial size was severely dilated. Right Atrium: Right atrial size was normal in size. Pericardium: There is no evidence of pericardial effusion. Mitral Valve: There is mild thickening of the mitral valve leaflet(s). There is mild calcification of the mitral valve leaflet(s). Mild mitral valve regurgitation. Tricuspid Valve: The tricuspid valve is normal in structure. Tricuspid valve regurgitation is trivial. Aortic Valve: The aortic valve is tricuspid. Aortic valve regurgitation is mild. Pulmonic Valve:  The pulmonic valve was normal in  structure. Pulmonic valve regurgitation is not visualized. Aorta: The aortic root and ascending aorta are structurally normal, with no evidence of dilitation. IAS/Shunts: No atrial level shunt detected by color flow Doppler.  LEFT VENTRICLE PLAX 2D LVIDd:         4.20 cm   Diastology LVIDs:         2.90 cm   LV e' medial:    4.90 cm/s LV PW:         1.00 cm   LV E/e' medial:  19.9 LV IVS:        1.20 cm   LV e' lateral:   7.94 cm/s LVOT diam:     1.80 cm   LV E/e' lateral: 12.3 LV SV:         41 LV SV Index:   24 LVOT Area:     2.54 cm  RIGHT VENTRICLE             IVC RV Basal diam:  2.60 cm     IVC diam: 1.40 cm RV S prime:     10.30 cm/s TAPSE (M-mode): 2.6 cm LEFT ATRIUM              Index        RIGHT ATRIUM           Index LA diam:        4.00 cm  2.36 cm/m   RA Area:     12.40 cm LA Vol (A2C):   52.0 ml  30.71 ml/m  RA Volume:   24.70 ml  14.59 ml/m LA Vol (A4C):   110.0 ml 64.97 ml/m LA Biplane Vol: 75.9 ml  44.83 ml/m  AORTIC VALVE LVOT Vmax:   89.00 cm/s LVOT Vmean:  54.800 cm/s LVOT VTI:    0.163 m  AORTA Ao Root diam: 3.30 cm Ao Asc diam:  3.30 cm MITRAL VALVE               TRICUSPID VALVE MV Area (PHT): 3.31 cm    TR Peak grad:   23.8 mmHg MV Decel Time: 229 msec    TR Vmax:        244.00 cm/s MV E velocity: 97.70 cm/s MV A velocity: 87.00 cm/s  SHUNTS MV E/A ratio:  1.12        Systemic VTI:  0.16 m                            Systemic Diam: 1.80 cm Dorris Carnes MD Electronically signed by Dorris Carnes MD Signature Date/Time: 07/09/2022/6:03:45 PM    Final     Cardiac Studies   Echo: 07/09/2022   IMPRESSIONS     1. Left ventricular ejection fraction, by estimation, is 55 to 60%. The  left ventricle has normal function. The left ventricle has no regional  wall motion abnormalities. There is mild left ventricular hypertrophy.  Left ventricular diastolic parameters  are consistent with Grade II diastolic dysfunction (pseudonormalization).   2. Right ventricular systolic function is normal. The  right ventricular  size is normal. There is normal pulmonary artery systolic pressure.   3. Left atrial size was severely dilated.   4. Mild mitral valve regurgitation.   5. The aortic valve is tricuspid. Aortic valve regurgitation is mild.   Comparison(s): The left ventricular function has improved.   FINDINGS   Left Ventricle: Left ventricular ejection fraction, by estimation,  is 55  to 60%. The left ventricle has normal function. The left ventricle has no  regional wall motion abnormalities. The left ventricular internal cavity  size was normal in size. There is   mild left ventricular hypertrophy. Left ventricular diastolic parameters  are consistent with Grade II diastolic dysfunction (pseudonormalization).   Right Ventricle: The right ventricular size is normal. Right vetricular  wall thickness was not assessed. Right ventricular systolic function is  normal. There is normal pulmonary artery systolic pressure. The tricuspid  regurgitant velocity is 2.44 m/s,  and with an assumed right atrial pressure of 3 mmHg, the estimated right  ventricular systolic pressure is 0000000 mmHg.   Left Atrium: Left atrial size was severely dilated.   Right Atrium: Right atrial size was normal in size.   Pericardium: There is no evidence of pericardial effusion.   Mitral Valve: There is mild thickening of the mitral valve leaflet(s).  There is mild calcification of the mitral valve leaflet(s). Mild mitral  valve regurgitation.   Tricuspid Valve: The tricuspid valve is normal in structure. Tricuspid  valve regurgitation is trivial.   Aortic Valve: The aortic valve is tricuspid. Aortic valve regurgitation is  mild.   Pulmonic Valve: The pulmonic valve was normal in structure. Pulmonic valve  regurgitation is not visualized.   Aorta: The aortic root and ascending aorta are structurally normal, with  no evidence of dilitation.   IAS/Shunts: No atrial level shunt detected by color flow  Doppler.   Patient Profile     89 y.o. female with a hx of chronic combined systolic and diastolic CHF, HTN, paroxysmal atrial fibrillation, GERD, hypothyroidism who is being seen 07/09/2022 for the evaluation of CHF at the request of Dr. Manuella Ghazi.   Assessment & Plan    HFpEF -- Echo 3/4 LVEF of 55 to 60%, no regional wall motion normality, grade 2 diastolic dysfunction, normal RV size and function, severe left atrial dilation, mild MR.  -- Has been diuresed with IV Lasix, net -2.3 L, weights are not accurate. Has been weaned to room air.  Does have desaturations with sleeping. -- GDMT: Continue metoprolol 12.5 mg twice daily, Farxiga, spironolactone 12.5 mg daily   Paroxysmal atrial fibrillation -- Remains in sinus rhythm -- Continue amiodarone 200 mg daily, Eliquis 5 mg twice daily   Hypertension -- Controlled -- Continue metoprolol 12.5 mg twice daily, spironolactone 12.5 daily  Will arrange for outpatient follow up appt  For questions or updates, please contact [place] Please consult [email or web address] for contact info under        Signed, Reino Bellis, NP  07/11/2022, 10:31 AM      Patient seen and examined. Agree with assessment and plan. I/O since admission - 3110.  Breathing better; SOB resolved.  Peripheral edema resolved.  Plan for discharge today with follow-up with Dr. Acie Fredrickson.   Troy Sine, MD, [facility] 07/11/2022 2:03 PM

## 2022-07-11 NOTE — Assessment & Plan Note (Signed)
Continue blood pressure control with metoprolol.  

## 2022-07-11 NOTE — Assessment & Plan Note (Signed)
Renal function with serum cr at 1,0 with K at 4,0 and serum bicarbonate at 25. Na 131 and Mg 2,0  Plan to continue diuresis with furosemide, spironolactone and SGLT 2 inh.  Follow up renal function and electrolytes in am.

## 2022-07-11 NOTE — Hospital Course (Signed)
Ellen Chandler was admitted to the hospital with the working diagnosis of decompensated heart failure.   89 yo female with the past medical history of heart failure, hypertension, and paroxysmal atrial fibrillation who presented with dyspnea. Rapid onset of worsening dyspnea. EMS was called and she was found hypoxemic  low 70's and in respiratory distress. In the ED she was placed on Bipap, blood pressure 160/91. HR 63, RR 25 and 02 saturation 91%, she had rales bilaterally, heart with S1 and S2 present and bradycardic, abdomen with no distention, and no lower extremity edema.   Na 136, K 4,7 CL 101 bicarbonate 27, glucose 112, bun 28 cr 1,19 Wbc 10,6 hgb 15.2 plt 305  Sars covid 19 negative.  Influenza negative   Chest radiograph with bilateral hilar vascular congestion, bilateral interstitial and alveolar infiltrates centrally and symmetrical, no effusions. Large hiatal hernia.   EKG 66 bpm, normal axis, normal intervals, sinus rhythm with positive PVC, no significant ST segment changes, negative T wave lead II, III, AvF, lead V1 and V2.   Patient was placed on furosemide for diuresis with improvement in her volume status.

## 2022-07-11 NOTE — Assessment & Plan Note (Addendum)
Echocardiogram with preserved LV systolic function EF 55 to 60%, mild LVH, RV systolic function preserved. LA with severe dilatation. No significant valvular disease.   Patient was placed on furosemide for diuresis, negative fluid balance was achieved, - 2,250 ml, and 4 kg weight loss, with significant improvement in her symptoms.   Patient will continue dapagliflozin, metoprolol and spironolactone.  Diuresis with furosemide.

## 2022-07-11 NOTE — Assessment & Plan Note (Signed)
Continue levothyroxine 

## 2022-07-11 NOTE — Progress Notes (Signed)
   Heart Failure Stewardship Pharmacist Progress Note   PCP: Deland Pretty, MD PCP-Cardiologist: Mertie Moores, MD    HPI:  89 yo F with PMH of CHF, HTN, hypothyroidism, and afib.    3 recent back to back admissions for CHF 8/23, 9/23 and 10/23.   Echo done 8/23 hospitalization. EF down to 40-45%, RV normal, in the setting of new atrial fibrillation. Diuresed w/ IV Lasix. Converted to SR on amio gtt. Transitioned to metoprolol and Eliquis. Discharged home w/ PRN lasix. Presented back 9/23 w/ recurrent SOB and CP. CXR showed mild pulmonary edema. Back in Afib w/ RVR. HS trop mildly elevated, peaked at 279. Diuresed w/ IV Lasix. Placed on PO amio for rhythm control w/ conversion back to SR.  R/LHC after diuresis showed normal cors and low filling pressures, mRAP 1, mPCW 3, PAP 12/5, CI 5. SGLT2i was added to regimen. Instructed to continue Lasix PRN. Discharged on 10/4. D/c wt 138 lb. Referred to [facility] clinic. Improved back to baseline, weight 142 lbs. Stable during cardiology follow up on 2/20, weight was 141 lbs.   Presented back to the ED on 3/4 with shortness of breath and hypoxia. No weight gain or LE edema. CXR with cardiomegaly and pulmonary vascular congestion. ECHO 3/4 showed improvement of EF to 55-60%, mild LVH, G2DD, and RV normal.   Discharge HF Medications: Diuretic: furosemide 20 mg daily Beta Blocker: metoprolol tartrate 12.5 mg BID MRA: spironolactone 12.5 mg daily SGLT2i: Farxiga 10 mg daily  Prior to admission HF Medications: Diuretic: furosemide 20 mg daily PRN Beta blocker: metoprolol tartrate 25 mg BID SGLT2i: Farxiga 10 mg daily  Pertinent Lab Values: Serum creatinine 1.09, BUN 30, Potassium 4.0, Sodium 131, BNP 484.1, Magnesium 2.0  Vital Signs: Weight: 141 lbs (admission weight: 138 lbs) Blood pressure: 120-140/60s  Heart rate: 60-70s  I/O: -0.2L yesterday; net -3.1L  Medication Assistance / Insurance Benefits Check: Does the patient have prescription  insurance?  Yes Type of insurance plan: [employer]  Outpatient Pharmacy:  Prior to admission [facility] Is the patient willing to use [facility] pharmacy at discharge? Yes Is the patient willing to transition their outpatient pharmacy to utilize a [facility] outpatient pharmacy?   Pending    Assessment: 1. Acute on chronic diastolic CHF (LVEF 0000000), due to NICM. NYHA class II symptoms. - Has been diuresed with IV lasix, symptoms improved. Agree with furosemide 20 mg daily for discharge. - Continue metoprolol tartrate 12.5 mg BID - Continue spironolactone 12.5 mg daily  - Continue Farxiga 10 mg daily   Plan: 1) Medication changes recommended at this time: - Agree with changes  2) Patient assistance: - None pending  3)  Education  - Patient has been educated on current HF medications and potential additions to HF medication regimen - Patient verbalizes understanding that over the next few months, these medication doses may change and more medications may be added to optimize HF regimen - Patient has been educated on basic disease state pathophysiology and goals of therapy   Kerby Nora, PharmD, BCPS Heart Failure Stewardship Pharmacist Phone [phone]

## 2022-07-19 NOTE — Progress Notes (Signed)
HEART & VASCULAR TRANSITION OF CARE NOTE   Referring Physician: Dr. Acie Fredrickson  Primary Care: Deland Pretty, MD Primary Cardiologist: Mertie Moores, MD   HPI: Referred to clinic by Dr. Acie Fredrickson for heart failure consultation.   89 y.o. female w/ chronic combined systolic and diastolic heart failure, recently diagnosed atrial fibrillation on Eliquis, HTN and hypothyroidism.   Echo 04/2013 EF 60-65%, RV normal  2 recent back to back admissions for CHF, 8/23 and again 9/23. Echo done 8/23 hospitalization. EF down to 40-45%, RV normal, in the setting of new atrial fibrillation.  Diuresed w/ IV Lasix. Converted to SR on amio gtt. Transitioned to metoprolol and Eliquis. Discharged home w/ PRN lasix.   Presented back 9/23 w/ recurrent SOB and CP. CXR showed mild pulmonary edema. Back in Afib w/ RVR. HS trop mildly elevated, peaked at 279. Diuresed w/ IV Lasix. Placed on PO amio for rhythm control w/ conversion back to SR.  R/LHC after diuresis showed normal cors and low filling pressures, mRAP 1, mPCW 3, PAP 12/5, CI 5. SGLT2i was added to regimen. Instructed to continue Lasix PRN. Discharged on 10/4. D/c wt 138 lb. Referred to [facility].   Seen in [facility] 10/23, NYHA II, maintaining NSR and volume stable. Referred to Gen Cards. Follow up with Dr. Acie Fredrickson 2/24 and was stable from HF and AF perspective.   Admitted 3/24 with a/c HF. Echo showed EF 55-60%, grade 2DD. Diuresed with IV lasix. Maintaining SR on amiodarone 200 mg daily. She was discharged home, weight 141 lbs.  Today she returns to [facility] for post hospital HF follow up with her daughter in law. Overall feeling fine. She lives at an independent living facility and walks the halls at her facility with her RW without dyspnea. She has been taking increased dose of Toprol and spiro for past few days by accident and has been dizzy. Daughter says recent admission occurred because Kolie did not take her lasix when she needed it. Her only symptoms with  volume overload is raspy voice (no weight gain or swelling). Denies palpitations, abnormal bleeding, CP, edema, or PND/Orthopnea. Appetite ok. No fever or chills. She is not weighing at home. Daughter in law and son live nearby and help often.  Cardiac Testing   - Echo (3/24): EF 55-60%, grade 2DD  - R/LHC (9/23): normal right dominant coronary circulation with no obs CAD RA 1 (mean) PCWP 3 (mean) CO/CI 8.5/5.1  - Echo (8/23): EF 40-45%, grade I DD, RV normal  Past Medical History:  Diagnosis Date   Abnormal ECG    a. Pt aware of long hx of abnl ecg->h/o normal stress testing in [place] ~ 2000.   Diastolic dysfunction    a. 04/2013 Echo: EF 60-65%, No rwma, Gr 1 DD, mild MR.   GERD (gastroesophageal reflux disease)    Hypertension    Hypothyroidism    Current Outpatient Medications  Medication Sig Dispense Refill   amiodarone (PACERONE) 200 MG tablet Take 1 tablet (200 mg total) by mouth daily. 90 tablet 3   apixaban (ELIQUIS) 5 MG TABS tablet Take 1 tablet (5 mg total) by mouth 2 (two) times daily. 60 tablet 11   dapagliflozin propanediol (FARXIGA) 10 MG TABS tablet Take 10 mg by mouth daily.     furosemide (LASIX) 20 MG tablet Take 1 tablet (20 mg total) by mouth daily. 30 tablet 0   lactose free nutrition (BOOST) LIQD Take 237 mLs by mouth at bedtime.  levothyroxine (SYNTHROID, LEVOTHROID) 88 MCG tablet Take 88 mcg by mouth daily before breakfast.     metoprolol tartrate (LOPRESSOR) 25 MG tablet Take 0.5 tablets (12.5 mg total) by mouth 2 (two) times daily. 30 tablet 0   Multiple Vitamin (MULTIVITAMIN WITH MINERALS) TABS tablet Take 1 tablet by mouth daily.     MYRBETRIQ 50 MG TB24 tablet Take 50 mg by mouth daily.     pantoprazole (PROTONIX) 40 MG tablet Take 40 mg by mouth 2 (two) times daily.     spironolactone (ALDACTONE) 25 MG tablet Take 0.5 tablets (12.5 mg total) by mouth daily. (Patient taking differently: Take 12.5 mg by mouth daily. Taking bid) 15 tablet 0    acetaminophen (TYLENOL) 500 MG tablet Take 1,000 mg by mouth every 6 (six) hours as needed for moderate pain. (Patient not taking: Reported on 07/20/2022)     No current facility-administered medications for this encounter.   No Known Allergies  Social History   Socioeconomic History   Marital status: Widowed    Spouse name: Not on file   Number of children: 4   Years of education: Not on file   Highest education level: High school graduate  Occupational History   Occupation: Retired  Tobacco Use   Smoking status: Never   Smokeless tobacco: Never  Vaping Use   Vaping Use: Never used  Substance and Sexual Activity   Alcohol use: Never   Drug use: No   Sexual activity: Not on file  Other Topics Concern   Not on file  Social History Narrative   Moved to [place] from [place] in June 2014.  Lives at [facility].  Was exercising daily.   Social Determinants of Health   Financial Resource Strain: Low Risk  (07/10/2022)   Overall Financial Resource Strain (CARDIA)    Difficulty of Paying Living Expenses: Not hard at all  Food Insecurity: No Food Insecurity (07/09/2022)   Hunger Vital Sign    Worried About Running Out of Food in the Last Year: Never true    Ran Out of Food in the Last Year: Never true  Transportation Needs: No Transportation Needs (07/09/2022)   PRAPARE - Hydrologist (Medical): No    Lack of Transportation (Non-Medical): No  Physical Activity: Not on file  Stress: Not on file  Social Connections: Not on file  Intimate Partner Violence: Not At Risk (07/09/2022)   Humiliation, Afraid, Rape, and Kick questionnaire    Fear of Current or Ex-Partner: No    Emotionally Abused: No    Physically Abused: No    Sexually Abused: No   Family History  Problem Relation Age of Onset   Heart attack Mother        died @ 50.   Diabetes type II Father        died @ 52.   Breast cancer Sister        died @ 17.   BP 110/80    Ht 5\' 5"  (1.651 m)   Wt 61.5 kg (135 lb 9.6 oz)   SpO2 95%   BMI 22.57 kg/m   Wt Readings from Last 3 Encounters:  07/20/22 61.5 kg (135 lb 9.6 oz)  07/11/22 64 kg (141 lb 1.5 oz)  06/26/22 64.1 kg (141 lb 6.4 oz)   PHYSICAL EXAM: General:  NAD. No resp difficulty, elderly, walked into clinic with rolling walker. HEENT: Normal Neck: Supple. No JVD. Carotids 2+ bilat; no bruits. No  lymphadenopathy or thryomegaly appreciated. Cor: PMI nondisplaced. Regular brady rate & rhythm. No rubs, gallops or murmurs. Lungs: Clear Abdomen: Soft, nontender, nondistended. No hepatosplenomegaly. No bruits or masses. Good bowel sounds. Extremities: No cyanosis, clubbing, rash, edema Neuro: Alert & oriented x 3, cranial nerves grossly intact. Moves all 4 extremities w/o difficulty. Affect pleasant.  ECG (personally reviewed): SB with 1AVB, 55 bpm  ASSESSMENT & PLAN: 1. Chronic Combined Systolic and Diastolic Heart Failure - Echo 2014 normal EF - Echo (8/23): EF 40-45%, RV ok. Suspect tachy mediated from rapid afib - L/RHC (9/23): w/ normal cors, low normal filling pressures and normal CO  - Echo (3/24): EF EF 55-60%, grade 2DD - NYHA Class II. She is not volume overloaded on exam, weight down 6 lbs, may be on the dry side. - Will try to minimize bill burden as she manages her own medications (would avoid bid doses if at all possible) - Stop lopressor tartrate. - Start Toprol XL 12.5 mg daily. - Decrease spironolactone to 12.5 mg daily. - Continue Farxiga 10 mg daily.  - Continue Lasix 20 mg daily. May be able to pull back next visit.  - Labs today.  2. PAF - SB on ECG today. QTc 461 msec  - Continue amiodarone 200 mg daily. - Cardiology to follow amio labs - Continue Eliquis 5 mg bid. Denies abnormal bleeding - CBC today.  3. Hypertension  - BP well-controlled. - No change.  4. Sinus Bradycardia - HR 55 on ECG today, on amiodarone and metoprolol for mantanence of SR  - Asymptomatic.  Will continue current regiment for now, as she does not tolerate Afib well w/ CHF  NYHA II GDMT  Diuretic- Lasix 20 mg daily.  BB- Toprol XL 12.5 Ace/ARB/ARNI No MRA: spiro 12.5  SGLT2i : Farxiga 10 mg daily   Referred to HFSW (PCP, Medications, Transportation, ETOH Abuse, Drug Abuse, Insurance, Museum/gallery curator ): No  Refer to Pharmacy: No Refer to Home Health: No Refer to Advanced Heart Failure Clinic: No  Refer to General Cardiology: Yes, next visit.  Follow up with TOC APP in 3 weeks for med/fluid check (consider changing Lasix to MWF). Then refer back to General Cardiology.  Allena Katz, FNP-BC 07/20/22

## 2022-07-20 ENCOUNTER — Ambulatory Visit (HOSPITAL_COMMUNITY)
Admit: 2022-07-20 | Discharge: 2022-07-20 | Disposition: A | Discharge status: Home / Self Care | Payer: PPO | Attending: Family Medicine | Admitting: Family Medicine

## 2022-07-20 ENCOUNTER — Encounter (HOSPITAL_COMMUNITY): Payer: Self-pay

## 2022-07-20 VITALS — BP 110/80 | HR 55 | Ht 65.0 in | Wt 135.6 lb

## 2022-07-20 DIAGNOSIS — Z7901 Long term (current) use of anticoagulants: Secondary | ICD-10-CM | POA: Diagnosis not present

## 2022-07-20 DIAGNOSIS — E039 Hypothyroidism, unspecified: Secondary | ICD-10-CM | POA: Diagnosis not present

## 2022-07-20 DIAGNOSIS — R001 Bradycardia, unspecified: Secondary | ICD-10-CM | POA: Diagnosis not present

## 2022-07-20 DIAGNOSIS — Z7984 Long term (current) use of oral hypoglycemic drugs: Secondary | ICD-10-CM | POA: Insufficient documentation

## 2022-07-20 DIAGNOSIS — I5042 Chronic combined systolic (congestive) and diastolic (congestive) heart failure: Secondary | ICD-10-CM | POA: Diagnosis not present

## 2022-07-20 DIAGNOSIS — Z79899 Other long term (current) drug therapy: Secondary | ICD-10-CM | POA: Diagnosis not present

## 2022-07-20 DIAGNOSIS — I480 Paroxysmal atrial fibrillation: Secondary | ICD-10-CM | POA: Diagnosis not present

## 2022-07-20 DIAGNOSIS — I10 Essential (primary) hypertension: Secondary | ICD-10-CM | POA: Diagnosis not present

## 2022-07-20 DIAGNOSIS — I110 Hypertensive heart disease with heart failure: Secondary | ICD-10-CM | POA: Diagnosis not present

## 2022-07-20 LAB — BASIC METABOLIC PANEL
Anion gap: 9 (ref 5–15)
BUN: 38 mg/dL — ABNORMAL HIGH (ref 8–23)
CO2: 29 mmol/L (ref 22–32)
Calcium: 9.3 mg/dL (ref 8.9–10.3)
Chloride: 93 mmol/L — ABNORMAL LOW (ref 98–111)
Creatinine, Ser: 1.23 mg/dL — ABNORMAL HIGH (ref 0.44–1.00)
GFR, Estimated: 42 mL/min — ABNORMAL LOW (ref >60)
Glucose, Bld: 74 mg/dL (ref 70–99)
Potassium: 4.6 mmol/L (ref 3.5–5.1)
Sodium: 131 mmol/L — ABNORMAL LOW (ref 135–145)

## 2022-07-20 LAB — CBC
HCT: 42.9 % (ref 36.0–46.0)
Hemoglobin: 14.4 g/dL (ref 12.0–15.0)
MCH: 31.5 pg (ref 26.0–34.0)
MCHC: 33.6 g/dL (ref 30.0–36.0)
MCV: 93.9 fL (ref 80.0–100.0)
Platelets: 429 10*3/uL — ABNORMAL HIGH (ref 150–400)
RBC: 4.57 MIL/uL (ref 3.87–5.11)
RDW: 13.1 % (ref 11.5–15.5)
WBC: 7 10*3/uL (ref 4.0–10.5)
nRBC: 0 % (ref 0.0–0.2)

## 2022-07-20 LAB — BRAIN NATRIURETIC PEPTIDE: B Natriuretic Peptide: 65.8 pg/mL (ref 0.0–100.0)

## 2022-07-20 MED ORDER — METOPROLOL SUCCINATE ER 25 MG PO TB24: 12.5000 mg | ORAL_TABLET | Freq: Every day | ORAL | 3 refills | Status: DC

## 2022-07-20 MED ORDER — SPIRONOLACTONE 25 MG PO TABS: 12.5000 mg | ORAL_TABLET | Freq: Every day | ORAL | 0 refills | Status: AC

## 2022-07-20 NOTE — Patient Instructions (Addendum)
Decrease Spiro to 12.5 mg (1/2 tablet) once daily. Stop Metoprolol (Lopressor). Start Toprol XL 12.5 mg (1/2 tablet) daily.  Labs drawn today, will call you if abnormal. Return to [facility] in 3 weeks.

## 2022-07-24 ENCOUNTER — Inpatient Hospital Stay (HOSPITAL_COMMUNITY)
Admission: EM | Admit: 2022-07-24 | Discharge: 2022-07-31 | DRG: 445 | Disposition: A | Discharge status: Skilled Nursing Facility | Payer: PPO | Attending: Family Medicine | Admitting: Family Medicine

## 2022-07-24 ENCOUNTER — Other Ambulatory Visit: Payer: Self-pay

## 2022-07-24 ENCOUNTER — Emergency Department (HOSPITAL_COMMUNITY): Payer: PPO

## 2022-07-24 ENCOUNTER — Encounter (HOSPITAL_COMMUNITY): Payer: Self-pay | Admitting: Emergency Medicine

## 2022-07-24 DIAGNOSIS — R109 Unspecified abdominal pain: Secondary | ICD-10-CM | POA: Diagnosis not present

## 2022-07-24 DIAGNOSIS — Z8249 Family history of ischemic heart disease and other diseases of the circulatory system: Secondary | ICD-10-CM

## 2022-07-24 DIAGNOSIS — Z7901 Long term (current) use of anticoagulants: Secondary | ICD-10-CM

## 2022-07-24 DIAGNOSIS — R1010 Upper abdominal pain, unspecified: Secondary | ICD-10-CM | POA: Diagnosis not present

## 2022-07-24 DIAGNOSIS — K449 Diaphragmatic hernia without obstruction or gangrene: Secondary | ICD-10-CM | POA: Diagnosis present

## 2022-07-24 DIAGNOSIS — I130 Hypertensive heart and chronic kidney disease with heart failure and stage 1 through stage 4 chronic kidney disease, or unspecified chronic kidney disease: Secondary | ICD-10-CM | POA: Diagnosis present

## 2022-07-24 DIAGNOSIS — N1832 Chronic kidney disease, stage 3b: Secondary | ICD-10-CM | POA: Diagnosis present

## 2022-07-24 DIAGNOSIS — Z66 Do not resuscitate: Secondary | ICD-10-CM | POA: Diagnosis present

## 2022-07-24 DIAGNOSIS — R1084 Generalized abdominal pain: Secondary | ICD-10-CM | POA: Diagnosis not present

## 2022-07-24 DIAGNOSIS — I700 Atherosclerosis of aorta: Secondary | ICD-10-CM | POA: Diagnosis present

## 2022-07-24 DIAGNOSIS — E860 Dehydration: Secondary | ICD-10-CM | POA: Diagnosis present

## 2022-07-24 DIAGNOSIS — K5792 Diverticulitis of intestine, part unspecified, without perforation or abscess without bleeding: Secondary | ICD-10-CM | POA: Diagnosis not present

## 2022-07-24 DIAGNOSIS — M6281 Muscle weakness (generalized): Secondary | ICD-10-CM | POA: Diagnosis not present

## 2022-07-24 DIAGNOSIS — K5730 Diverticulosis of large intestine without perforation or abscess without bleeding: Secondary | ICD-10-CM | POA: Diagnosis present

## 2022-07-24 DIAGNOSIS — E785 Hyperlipidemia, unspecified: Secondary | ICD-10-CM | POA: Diagnosis present

## 2022-07-24 DIAGNOSIS — I5042 Chronic combined systolic (congestive) and diastolic (congestive) heart failure: Secondary | ICD-10-CM | POA: Diagnosis present

## 2022-07-24 DIAGNOSIS — E871 Hypo-osmolality and hyponatremia: Secondary | ICD-10-CM | POA: Diagnosis present

## 2022-07-24 DIAGNOSIS — K8000 Calculus of gallbladder with acute cholecystitis without obstruction: Secondary | ICD-10-CM | POA: Diagnosis present

## 2022-07-24 DIAGNOSIS — Z833 Family history of diabetes mellitus: Secondary | ICD-10-CM

## 2022-07-24 DIAGNOSIS — Z434 Encounter for attention to other artificial openings of digestive tract: Secondary | ICD-10-CM | POA: Diagnosis not present

## 2022-07-24 DIAGNOSIS — F1011 Alcohol abuse, in remission: Secondary | ICD-10-CM | POA: Diagnosis not present

## 2022-07-24 DIAGNOSIS — E1122 Type 2 diabetes mellitus with diabetic chronic kidney disease: Secondary | ICD-10-CM | POA: Diagnosis present

## 2022-07-24 DIAGNOSIS — Z6823 Body mass index (BMI) 23.0-23.9, adult: Secondary | ICD-10-CM

## 2022-07-24 DIAGNOSIS — I5032 Chronic diastolic (congestive) heart failure: Secondary | ICD-10-CM | POA: Diagnosis present

## 2022-07-24 DIAGNOSIS — K5900 Constipation, unspecified: Secondary | ICD-10-CM | POA: Diagnosis not present

## 2022-07-24 DIAGNOSIS — E039 Hypothyroidism, unspecified: Secondary | ICD-10-CM | POA: Diagnosis present

## 2022-07-24 DIAGNOSIS — E222 Syndrome of inappropriate secretion of antidiuretic hormone: Secondary | ICD-10-CM | POA: Diagnosis present

## 2022-07-24 DIAGNOSIS — I480 Paroxysmal atrial fibrillation: Secondary | ICD-10-CM | POA: Diagnosis present

## 2022-07-24 DIAGNOSIS — E440 Moderate protein-calorie malnutrition: Secondary | ICD-10-CM | POA: Diagnosis present

## 2022-07-24 DIAGNOSIS — K219 Gastro-esophageal reflux disease without esophagitis: Secondary | ICD-10-CM | POA: Diagnosis present

## 2022-07-24 DIAGNOSIS — I10 Essential (primary) hypertension: Secondary | ICD-10-CM | POA: Diagnosis present

## 2022-07-24 DIAGNOSIS — J9601 Acute respiratory failure with hypoxia: Secondary | ICD-10-CM | POA: Diagnosis not present

## 2022-07-24 DIAGNOSIS — I5043 Acute on chronic combined systolic (congestive) and diastolic (congestive) heart failure: Secondary | ICD-10-CM | POA: Diagnosis not present

## 2022-07-24 DIAGNOSIS — N179 Acute kidney failure, unspecified: Secondary | ICD-10-CM | POA: Diagnosis present

## 2022-07-24 DIAGNOSIS — Z79899 Other long term (current) drug therapy: Secondary | ICD-10-CM

## 2022-07-24 DIAGNOSIS — R0602 Shortness of breath: Secondary | ICD-10-CM | POA: Diagnosis not present

## 2022-07-24 DIAGNOSIS — R079 Chest pain, unspecified: Secondary | ICD-10-CM | POA: Diagnosis not present

## 2022-07-24 DIAGNOSIS — K810 Acute cholecystitis: Secondary | ICD-10-CM | POA: Diagnosis present

## 2022-07-24 DIAGNOSIS — Z79890 Hormone replacement therapy (postmenopausal): Secondary | ICD-10-CM | POA: Diagnosis not present

## 2022-07-24 DIAGNOSIS — K6389 Other specified diseases of intestine: Secondary | ICD-10-CM | POA: Diagnosis not present

## 2022-07-24 DIAGNOSIS — K8010 Calculus of gallbladder with chronic cholecystitis without obstruction: Secondary | ICD-10-CM | POA: Diagnosis not present

## 2022-07-24 DIAGNOSIS — R0902 Hypoxemia: Secondary | ICD-10-CM | POA: Diagnosis present

## 2022-07-24 DIAGNOSIS — R0989 Other specified symptoms and signs involving the circulatory and respiratory systems: Secondary | ICD-10-CM | POA: Diagnosis not present

## 2022-07-24 DIAGNOSIS — R52 Pain, unspecified: Secondary | ICD-10-CM | POA: Diagnosis not present

## 2022-07-24 DIAGNOSIS — R2689 Other abnormalities of gait and mobility: Secondary | ICD-10-CM | POA: Diagnosis not present

## 2022-07-24 DIAGNOSIS — R9431 Abnormal electrocardiogram [ECG] [EKG]: Secondary | ICD-10-CM | POA: Diagnosis not present

## 2022-07-24 DIAGNOSIS — E861 Hypovolemia: Secondary | ICD-10-CM | POA: Diagnosis not present

## 2022-07-24 DIAGNOSIS — I5033 Acute on chronic diastolic (congestive) heart failure: Secondary | ICD-10-CM | POA: Diagnosis not present

## 2022-07-24 DIAGNOSIS — J9811 Atelectasis: Secondary | ICD-10-CM | POA: Diagnosis present

## 2022-07-24 DIAGNOSIS — R1030 Lower abdominal pain, unspecified: Secondary | ICD-10-CM | POA: Diagnosis not present

## 2022-07-24 DIAGNOSIS — R278 Other lack of coordination: Secondary | ICD-10-CM | POA: Diagnosis not present

## 2022-07-24 LAB — COMPREHENSIVE METABOLIC PANEL
ALT: 47 U/L — ABNORMAL HIGH (ref 0–44)
AST: 43 U/L — ABNORMAL HIGH (ref 15–41)
Albumin: 3.7 g/dL (ref 3.5–5.0)
Alkaline Phosphatase: 38 U/L (ref 38–126)
Anion gap: 13 (ref 5–15)
BUN: 34 mg/dL — ABNORMAL HIGH (ref 8–23)
CO2: 28 mmol/L (ref 22–32)
Calcium: 9.4 mg/dL (ref 8.9–10.3)
Chloride: 91 mmol/L — ABNORMAL LOW (ref 98–111)
Creatinine, Ser: 1.2 mg/dL — ABNORMAL HIGH (ref 0.44–1.00)
GFR, Estimated: 43 mL/min — ABNORMAL LOW (ref >60)
Glucose, Bld: 129 mg/dL — ABNORMAL HIGH (ref 70–99)
Potassium: 3.9 mmol/L (ref 3.5–5.1)
Sodium: 132 mmol/L — ABNORMAL LOW (ref 135–145)
Total Bilirubin: 1.1 mg/dL (ref 0.3–1.2)
Total Protein: 7.2 g/dL (ref 6.5–8.1)

## 2022-07-24 LAB — CBC WITH DIFFERENTIAL/PLATELET
Abs Immature Granulocytes: 0.27 10*3/uL — ABNORMAL HIGH (ref 0.00–0.07)
Basophils Absolute: 0.1 10*3/uL (ref 0.0–0.1)
Basophils Relative: 0 %
Eosinophils Absolute: 0.1 10*3/uL (ref 0.0–0.5)
Eosinophils Relative: 0 %
HCT: 47.8 % — ABNORMAL HIGH (ref 36.0–46.0)
Hemoglobin: 16.2 g/dL — ABNORMAL HIGH (ref 12.0–15.0)
Immature Granulocytes: 1 %
Lymphocytes Relative: 7 %
Lymphs Abs: 1.6 10*3/uL (ref 0.7–4.0)
MCH: 32 pg (ref 26.0–34.0)
MCHC: 33.9 g/dL (ref 30.0–36.0)
MCV: 94.5 fL (ref 80.0–100.0)
Monocytes Absolute: 1.8 10*3/uL — ABNORMAL HIGH (ref 0.1–1.0)
Monocytes Relative: 8 %
Neutro Abs: 19.4 10*3/uL — ABNORMAL HIGH (ref 1.7–7.7)
Neutrophils Relative %: 84 %
Platelets: 402 10*3/uL — ABNORMAL HIGH (ref 150–400)
RBC: 5.06 MIL/uL (ref 3.87–5.11)
RDW: 13.3 % (ref 11.5–15.5)
WBC: 23.2 10*3/uL — ABNORMAL HIGH (ref 4.0–10.5)
nRBC: 0 % (ref 0.0–0.2)

## 2022-07-24 LAB — LIPASE, BLOOD: Lipase: 22 U/L (ref 11–51)

## 2022-07-24 LAB — LACTIC ACID, PLASMA
Lactic Acid, Venous: 1.5 mmol/L (ref 0.5–1.9)
Lactic Acid, Venous: 1.4 mmol/L (ref 0.5–1.9)

## 2022-07-24 MED ORDER — LEVOTHYROXINE SODIUM 88 MCG PO TABS
88.0000 ug | ORAL_TABLET | Freq: Every day | ORAL | Status: DC
  Administered 2022-07-25 – 2022-07-31 (×6): 88 ug via ORAL
  Filled 2022-07-24 (×6): qty 1

## 2022-07-24 MED ORDER — HYDROCODONE-ACETAMINOPHEN 5-325 MG PO TABS
1.0000 | ORAL_TABLET | Freq: Once | ORAL | Status: AC
  Administered 2022-07-24: 1 via ORAL
  Filled 2022-07-24: qty 1

## 2022-07-24 MED ORDER — PANTOPRAZOLE SODIUM 40 MG PO TBEC
40.0000 mg | DELAYED_RELEASE_TABLET | Freq: Two times a day (BID) | ORAL | Status: DC
  Administered 2022-07-24 – 2022-07-31 (×14): 40 mg via ORAL
  Filled 2022-07-24 (×14): qty 1

## 2022-07-24 MED ORDER — SPIRONOLACTONE 12.5 MG HALF TABLET
12.5000 mg | ORAL_TABLET | Freq: Every day | ORAL | Status: DC
  Filled 2022-07-24: qty 1

## 2022-07-24 MED ORDER — SODIUM CHLORIDE 0.9 % IV SOLN
2.0000 g | Freq: Once | INTRAVENOUS | Status: AC
  Administered 2022-07-24: 2 g via INTRAVENOUS
  Filled 2022-07-24: qty 20

## 2022-07-24 MED ORDER — MIRABEGRON ER 25 MG PO TB24
50.0000 mg | ORAL_TABLET | Freq: Every day | ORAL | Status: DC
  Administered 2022-07-25 – 2022-07-26 (×2): 50 mg via ORAL
  Filled 2022-07-24 (×3): qty 2

## 2022-07-24 MED ORDER — OXYCODONE HCL 5 MG PO TABS
5.0000 mg | ORAL_TABLET | ORAL | Status: DC | PRN
  Administered 2022-07-25 – 2022-07-31 (×21): 5 mg via ORAL
  Filled 2022-07-24 (×23): qty 1

## 2022-07-24 MED ORDER — AMIODARONE HCL 200 MG PO TABS
200.0000 mg | ORAL_TABLET | Freq: Every day | ORAL | Status: DC
  Administered 2022-07-24 – 2022-07-31 (×8): 200 mg via ORAL
  Filled 2022-07-24 (×8): qty 1

## 2022-07-24 MED ORDER — ACETAMINOPHEN 325 MG PO TABS
650.0000 mg | ORAL_TABLET | Freq: Four times a day (QID) | ORAL | Status: DC | PRN
  Administered 2022-07-25 – 2022-07-29 (×7): 650 mg via ORAL
  Filled 2022-07-24 (×7): qty 2

## 2022-07-24 MED ORDER — METOPROLOL SUCCINATE ER 25 MG PO TB24
12.5000 mg | ORAL_TABLET | Freq: Every day | ORAL | Status: DC
  Administered 2022-07-25 – 2022-07-26 (×2): 12.5 mg via ORAL
  Filled 2022-07-24 (×3): qty 1

## 2022-07-24 MED ORDER — SODIUM CHLORIDE 0.9 % IV BOLUS
1000.0000 mL | Freq: Once | INTRAVENOUS | Status: AC
  Administered 2022-07-24: 1000 mL via INTRAVENOUS

## 2022-07-24 MED ORDER — IOHEXOL 300 MG/ML  SOLN
80.0000 mL | Freq: Once | INTRAMUSCULAR | Status: AC | PRN
  Administered 2022-07-24: 80 mL via INTRAVENOUS

## 2022-07-24 MED ORDER — ACETAMINOPHEN 650 MG RE SUPP: 650.0000 mg | Freq: Four times a day (QID) | RECTAL | Status: DC | PRN

## 2022-07-24 MED ORDER — PIPERACILLIN-TAZOBACTAM 3.375 G IVPB 30 MIN
3.3750 g | Freq: Three times a day (TID) | INTRAVENOUS | Status: DC
  Administered 2022-07-24 – 2022-07-25 (×3): 3.375 g via INTRAVENOUS
  Filled 2022-07-24 (×5): qty 50

## 2022-07-24 NOTE — Consult Note (Signed)
CC: Right-sided pain  Requesting provider: Charmaine Downs, PA  HPI: Ellen Chandler is an 89 y.o. female who is here for evaluation for right-sided abdominal pain.  Past medical history of combined systolic and diastolic heart failure, recent diagnosis of PAF on Eliquis, hypothyroidism, hypertension who reports that she developed right-sided abdominal pain Monday morning after breakfast.  It persisted.  It was always there but it would wax and wane.  No fever or chills.  Decreased appetite after the discomfort started.  Last bowel movement was earlier today prior to coming to the emergency room.  She denies prior symptoms.  She took some Metamucil to see if that would help but it did not.  Nausea or vomiting.  Denies prior abdominal surgery.  Her daughter-in-law helps with her medication she states.  She is on Eliquis.  Patient had recent hospitalization in March for acute on chronic heart failure.  Echocardiogram showed EF of 55 to 60%.  Patient was diuresed with Lasix and maintain sinus rhythm on amiodarone.  She has had outpatient follow-up with the cardiology NP.  She had had prior hospitalizations back in August and September for CHF exacerbations.  Past Medical History:  Diagnosis Date   Abnormal ECG    a. Pt aware of long hx of abnl ecg->h/o normal stress testing in [place] ~ 2000.   Diastolic dysfunction    a. 04/2013 Echo: EF 60-65%, No rwma, Gr 1 DD, mild MR.   GERD (gastroesophageal reflux disease)    Hypertension    Hypothyroidism     Past Surgical History:  Procedure Laterality Date   ABDOMINAL HYSTERECTOMY     JOINT REPLACEMENT     a. knee left - injury resulted after being attacked by a dog.   LEFT HEART CATH AND CORONARY ANGIOGRAPHY N/A 01/05/2022   Procedure: LEFT HEART CATH AND CORONARY ANGIOGRAPHY;  Surgeon: Belva Crome, MD;  Location: [facility];  Service: Cardiovascular;  Laterality: N/A;   RIGHT/LEFT HEART CATH AND CORONARY ANGIOGRAPHY N/A 01/09/2022    Procedure: RIGHT/LEFT HEART CATH AND CORONARY ANGIOGRAPHY;  Surgeon: Early Osmond, MD;  Location: [facility];  Service: Cardiovascular;  Laterality: N/A;    Family History  Problem Relation Age of Onset   Heart attack Mother        died @ 63.   Diabetes type II Father        died @ 74.   Breast cancer Sister        died @ 15.    Social:  reports that she has never smoked. She has never used smokeless tobacco. She reports that she does not drink alcohol and does not use drugs.  Allergies: No Known Allergies  Medications: I have reviewed the patient's current medications.   ROS - all of the below systems have been reviewed with the patient and positives are indicated with bold text General: chills, fever or night sweats Eyes: blurry vision or double vision ENT: epistaxis or sore throat Allergy/Immunology: itchy/watery eyes or nasal congestion Hematologic/Lymphatic: bleeding problems, blood clots or swollen lymph nodes Endocrine: temperature intolerance or unexpected weight changes Breast: new or changing breast lumps or nipple discharge Resp: cough, shortness of breath, or wheezing CV: chest pain or dyspnea on exertion GI: as per HPI GU: dysuria, trouble voiding, or hematuria MSK: joint pain or joint stiffness Neuro: TIA or stroke symptoms Derm: pruritus and skin lesion changes Psych: anxiety and depression  PE Blood pressure (!) 100/49, pulse 81, temperature  97.6 F (36.4 C), resp. rate (!) 21, height 5\' 5"  (1.651 m), weight 63.5 kg, SpO2 (!) 88 %. Constitutional: NAD; conversant; no deformities; slightly ill appearance Eyes: Moist conjunctiva; no lid lag; anicteric; PERRL Neck: Trachea midline; no thyromegaly Lungs: Normal respiratory effort; no tactile fremitus CV: RRR; no palpable thrills; no pitting edema GI: Abd soft, a little bloated, tenderness to palpation in the upper abdomen, right upper quadrant and epigastric.  No peritonitis.; no palpable  hepatosplenomegaly MSK:  no clubbing/cyanosis Psychiatric: Appropriate affect; alert and oriented x3 Lymphatic: No palpable cervical or axillary lymphadenopathy Skin: No edema  Results for orders placed or performed during the hospital encounter of 07/24/22 (from the past 48 hour(s))  CBC with Differential     Status: Abnormal   Collection Time: 07/24/22  1:25 PM  Result Value Ref Range   WBC 23.2 (H) 4.0 - 10.5 K/uL   RBC 5.06 3.87 - 5.11 MIL/uL   Hemoglobin 16.2 (H) 12.0 - 15.0 g/dL   HCT 47.8 (H) 36.0 - 46.0 %   MCV 94.5 80.0 - 100.0 fL   MCH 32.0 26.0 - 34.0 pg   MCHC 33.9 30.0 - 36.0 g/dL   RDW 13.3 11.5 - 15.5 %   Platelets 402 (H) 150 - 400 K/uL   nRBC 0.0 0.0 - 0.2 %   Neutrophils Relative % 84 %   Neutro Abs 19.4 (H) 1.7 - 7.7 K/uL   Lymphocytes Relative 7 %   Lymphs Abs 1.6 0.7 - 4.0 K/uL   Monocytes Relative 8 %   Monocytes Absolute 1.8 (H) 0.1 - 1.0 K/uL   Eosinophils Relative 0 %   Eosinophils Absolute 0.1 0.0 - 0.5 K/uL   Basophils Relative 0 %   Basophils Absolute 0.1 0.0 - 0.1 K/uL   Immature Granulocytes 1 %   Abs Immature Granulocytes 0.27 (H) 0.00 - 0.07 K/uL    Comment: Performed at [facility], [place] [address] [place], [place] [address]  Comprehensive metabolic panel     Status: Abnormal   Collection Time: 07/24/22  1:25 PM  Result Value Ref Range   Sodium 132 (L) 135 - 145 mmol/L   Potassium 3.9 3.5 - 5.1 mmol/L   Chloride 91 (L) 98 - 111 mmol/L   CO2 28 22 - 32 mmol/L   Glucose, Bld 129 (H) 70 - 99 mg/dL    Comment: Glucose reference range applies only to samples taken after fasting for at least 8 hours.   BUN 34 (H) 8 - 23 mg/dL   Creatinine, Ser 1.20 (H) 0.44 - 1.00 mg/dL   Calcium 9.4 8.9 - 10.3 mg/dL   Total Protein 7.2 6.5 - 8.1 g/dL   Albumin 3.7 3.5 - 5.0 g/dL   AST 43 (H) 15 - 41 U/L   ALT 47 (H) 0 - 44 U/L   Alkaline Phosphatase 38 38 - 126 U/L   Total Bilirubin 1.1 0.3 - 1.2 mg/dL   GFR, Estimated 43 (L) >60  mL/min    Comment: (NOTE) Calculated using the CKD-EPI Creatinine Equation (2021)    Anion gap 13 5 - 15    Comment: Performed at [facility], [place] [address], [place], [place] [address]  Lipase, blood     Status: None   Collection Time: 07/24/22  1:25 PM  Result Value Ref Range   Lipase 22 11 - 51 U/L    Comment: Performed at [facility], [place] [address], [place], [place] [address]  Lactic acid, plasma     Status: None   Collection Time: 07/24/22  2:11 PM  Result Value Ref Range   Lactic Acid, Venous 1.5 0.5 - 1.9 mmol/L    Comment: Performed at [facility], [place] [address] [place], [place] [address]    CT ABDOMEN PELVIS W CONTRAST  Result Date: 07/24/2022 CLINICAL DATA:  Right lower quadrant pain EXAM: CT ABDOMEN AND PELVIS WITH CONTRAST TECHNIQUE: Multidetector CT imaging of the abdomen and pelvis was performed using the standard protocol following bolus administration of intravenous contrast. RADIATION DOSE REDUCTION: This exam was performed according to the departmental dose-optimization program which includes automated exposure control, adjustment of the mA and/or kV according to patient size and/or use of iterative reconstruction technique. CONTRAST:  29mL OMNIPAQUE IOHEXOL 300 MG/ML  SOLN COMPARISON:  09/20/2017 FINDINGS: Lower chest: Moderate hiatal hernia involving the fundus and cardia, incompletely visualized, with some adjacent compressive atelectasis medially in both lung bases. No pleural or pericardial effusion. Heart size normal. Scattered ground-glass opacities in the visualized right lower lung. No pleural or pericardial effusion. Hepatobiliary: Marked dilatation of the gallbladder containing stones measured up to 3.1 cm diameter. There is wall thickening and mucosal enhancement with marked surrounding edema and inflammation. There is no intrahepatic biliary ductal dilatation. No focal liver lesion. Pancreas:  Unremarkable. No pancreatic ductal dilatation or surrounding inflammatory changes. Spleen: Normal in size without focal abnormality. Adrenals/Urinary Tract: Adrenal glands are unremarkable. Kidneys are normal, without renal calculi, focal lesion, or hydronephrosis. Bladder is physiologically distended. Stomach/Bowel: Large hiatal hernia involving gastric fundus and cardia. Dilated duodenal bulb, with focal narrowing in the proximal duodenum related to the distended inflamed gallbladder. There is gas and fluid distention of proximal small bowel loops. Distal small bowel decompressed, without discrete transition point. There is gaseous distention of the cecum. There is tapered narrowing of the ascending colon related to mass effect from the gallbladder inflammation and distension. The more distal colon is nondilated with a few scattered sigmoid diverticula; no adjacent inflammatory change. Vascular/Lymphatic: Scattered aortoiliac calcified plaque without aneurysm. Portal vein patent. No abdominal or pelvic adenopathy. Reproductive: Status post hysterectomy. No adnexal masses. Other: No ascites.  No free air. Musculoskeletal: Moderate L1 compression deformity with approximately 20% loss of height anteriorly, and diffuse sclerosis, new since most recent radiograph 06/17/2021. Chronic L2 compression deformity stable. Grade 1 anterolisthesis L5-S1 as before. IMPRESSION: 1. Acute cholecystitis with cholelithiasis. Recommend surgical consultation. 2. Proximal small bowel distention suggesting ileus. 3. Large hiatal hernia. 4. L1 compression deformity, new since 06/17/2021. 5. Sigmoid diverticulosis. 6. Scattered ground-glass opacities in the visualized right lower lung, possibly infectious/inflammatory. Non-contrast chest CT at 3-6 months is recommended. If nodules persist, subsequent management will be based upon the most suspicious nodule(s). This recommendation follows the consensus statement: Guidelines for Management  of Incidental Pulmonary Nodules Detected on CT Images:From the Fleischner Society 2017; published online before print (10.1148/radiol.SG:5268862). 7.  Aortic Atherosclerosis (ICD10-I70.0). Electronically Signed   By: Lucrezia Europe M.D.   On: 07/24/2022 16:07   DG Chest 2 View  Result Date: 07/24/2022 CLINICAL DATA:  Hypoxia, abdominal pain EXAM: CHEST - 2 VIEW COMPARISON:  07/09/2022 FINDINGS: Transverse diameter of heart is increased. There is large fixed hiatal hernia. There is significant interval improvement in aeration in both lungs. Small residual patchy infiltrates are seen in right parahilar region. There are no signs of alveolar pulmonary edema. There is no significant pleural effusion or pneumothorax. IMPRESSION: There is interval clearing of pulmonary vascular congestion.  Small residual patchy densities are seen in right parahilar region suggesting residual scarring or subsegmental atelectasis. There is no focal pulmonary consolidation. Large fixed hiatal hernia. Electronically Signed   By: Elmer Picker M.D.   On: 07/24/2022 14:55    Imaging: Personally reviewed  A/P: Ellen Chandler is an 89 y.o. female with  Acute calculous cholecystitis Proximal small bowel distension  Essential hypertension   Paroxysmal A-fib (HCC) on Eliquis   Mild Hyponatremia   Hypothyroidism chronic systolic and diastolic CHF (congestive heart failure)  Large hiatal hernia  Even though patient reports only about a 36-hour to 48-hour history of symptoms she has fairly impressive right upper quadrant inflammation involving her gallbladder.  There is some reactive  proximal small bowel distention.  She has a leukocytosis of 23.  She has some mild elevated AST and ALT.  Admit inpatient IV Zosyn N.p.o. except sips of liquids, meds Hold Eliquis Recommend nonurgent cardiology evaluation for risk stratification Given the degree of inflammation in the right upper quadrant-we may manage this initially with  cholecystostomy tube. Will Discuss with Dr. Marcello Moores our hospital surgeon this week in the morning.  Final plan to be determined Trend labs  High medical decision making-, extensive data review, decision for hospitalization, identified risk factors for complications such as her age, chronic heart failure, degree of inflammation involving the gallbladder  Data reviewed: Echocardiogram July 09, 2022; hospital discharge summary March 10/2022, cardiology progress note July 20, 2022; comprehensive metabolic panel March 19, March 4; CBC March 19, March 15, March 4 through 6, vitals x 6 hours, lipase and lactate March 19; right/left heart catheterization September 2023; hospitalist H&P  Leighton Ruff. Redmond Pulling, MD, FACS General, Bariatric, & Minimally Invasive Surgery [place]

## 2022-07-24 NOTE — ED Notes (Signed)
Patient transported to CT 

## 2022-07-24 NOTE — ED Notes (Signed)
Pt stated when she is completely still her pain is a 0/10, but if she moves even slightly it goes up to a 10/10

## 2022-07-24 NOTE — ED Provider Notes (Signed)
[facility] Provider Note   CSN: [id number] Arrival date & time: 07/24/22  1302     History  Chief Complaint  Patient presents with   Abdominal Pain    Ellen Chandler is a 89 y.o. female with medical history of diastolic dysfunction, GERD, hypertension, hypothyroid.  Patient presents to ED for evaluation of right-sided abdominal pain.  Patient reports that beginning yesterday morning she developed right-sided abdominal pain after eating and drinking her coffee and cereal.  Patient reports that abdominal pain has persisted throughout the day.  Patient denies any nausea, vomiting, diarrhea.  Patient reports that she did take MiraLAX yesterday and had 2 bowel movements as a result.  Patient denies fevers, chest pain, shortness of breath.     Abdominal Pain Associated symptoms: no chest pain, no constipation, no diarrhea, no fever, no nausea, no shortness of breath and no vomiting        Home Medications Prior to Admission medications   Medication Sig Start Date End Date Taking? Authorizing Provider  acetaminophen (TYLENOL) 500 MG tablet Take 1,000 mg by mouth every 6 (six) hours as needed for moderate pain. Patient not taking: Reported on 07/20/2022    [provider]  amiodarone (PACERONE) 200 MG tablet Take 1 tablet (200 mg total) by mouth daily. 02/14/22   Lyda Jester M, PA-C  apixaban (ELIQUIS) 5 MG TABS tablet Take 1 tablet (5 mg total) by mouth 2 (two) times daily. 02/14/22   Lyda Jester M, PA-C  dapagliflozin propanediol (FARXIGA) 10 MG TABS tablet Take 10 mg by mouth daily.    [provider]  furosemide (LASIX) 20 MG tablet Take 1 tablet (20 mg total) by mouth daily. 07/11/22 08/10/22  Arrien, Jimmy Picket, MD  lactose free nutrition (BOOST) LIQD Take 237 mLs by mouth at bedtime.    [provider]  levothyroxine (SYNTHROID, LEVOTHROID) 88 MCG tablet Take 88 mcg by mouth daily before  breakfast.    [provider]  metoprolol succinate (TOPROL XL) 25 MG 24 hr tablet Take 0.5 tablets (12.5 mg total) by mouth daily. 07/20/22   Rafael Bihari, FNP  Multiple Vitamin (MULTIVITAMIN WITH MINERALS) TABS tablet Take 1 tablet by mouth daily.    [provider]  MYRBETRIQ 50 MG TB24 tablet Take 50 mg by mouth daily. 10/03/21   [provider]  pantoprazole (PROTONIX) 40 MG tablet Take 40 mg by mouth 2 (two) times daily. 09/18/21   [provider]  spironolactone (ALDACTONE) 25 MG tablet Take 0.5 tablets (12.5 mg total) by mouth daily. 07/20/22 08/19/22  Rafael Bihari, FNP      Allergies    Patient has no known allergies.    Review of Systems   Review of Systems  Constitutional:  Negative for fever.  Respiratory:  Negative for shortness of breath.   Cardiovascular:  Negative for chest pain.  Gastrointestinal:  Positive for abdominal pain. Negative for blood in stool, constipation, diarrhea, nausea and vomiting.    Physical Exam Updated Vital Signs BP 107/62   Pulse 74   Temp (!) 97.5 F (36.4 C) (Oral)   Resp (!) 22   Ht 5\' 5"  (1.651 m)   Wt 63.5 kg   SpO2 94%   BMI 23.30 kg/m  Physical Exam Vitals and nursing note reviewed.  Constitutional:      General: She is not in acute distress.    Appearance: Normal appearance. She is not ill-appearing, toxic-appearing or diaphoretic.  HENT:     Head: Normocephalic and atraumatic.     Nose: Nose normal.     Mouth/Throat:     Mouth: Mucous membranes are moist.     Pharynx: Oropharynx is clear.  Eyes:     Extraocular Movements: Extraocular movements intact.     Conjunctiva/sclera: Conjunctivae normal.     Pupils: Pupils are equal, round, and reactive to light.  Cardiovascular:     Rate and Rhythm: Normal rate and regular rhythm.  Pulmonary:     Effort: Pulmonary effort is normal.     Breath sounds: Normal breath sounds. No wheezing.  Abdominal:     Tenderness: There is abdominal  tenderness. There is guarding. There is no right CVA tenderness or left CVA tenderness.  Musculoskeletal:     Cervical back: Normal range of motion and neck supple. No tenderness.     Right lower leg: No edema.     Left lower leg: No edema.  Skin:    General: Skin is warm and dry.     Capillary Refill: Capillary refill takes less than 2 seconds.  Neurological:     Mental Status: She is alert and oriented to person, place, and time.     ED Results / Procedures / Treatments   Labs (all labs ordered are listed, but only abnormal results are displayed) Labs Reviewed  CBC WITH DIFFERENTIAL/PLATELET - Abnormal; Notable for the following components:      Result Value   WBC 23.2 (*)    Hemoglobin 16.2 (*)    HCT 47.8 (*)    Platelets 402 (*)    Neutro Abs 19.4 (*)    Monocytes Absolute 1.8 (*)    Abs Immature Granulocytes 0.27 (*)    All other components within normal limits  COMPREHENSIVE METABOLIC PANEL - Abnormal; Notable for the following components:   Sodium 132 (*)    Chloride 91 (*)    Glucose, Bld 129 (*)    BUN 34 (*)    Creatinine, Ser 1.20 (*)    AST 43 (*)    ALT 47 (*)    GFR, Estimated 43 (*)    All other components within normal limits  LIPASE, BLOOD  LACTIC ACID, PLASMA  URINALYSIS, ROUTINE W REFLEX MICROSCOPIC  LACTIC ACID, PLASMA    EKG None  Radiology DG Chest 2 View  Result Date: 07/24/2022 CLINICAL DATA:  Hypoxia, abdominal pain EXAM: CHEST - 2 VIEW COMPARISON:  07/09/2022 FINDINGS: Transverse diameter of heart is increased. There is large fixed hiatal hernia. There is significant interval improvement in aeration in both lungs. Small residual patchy infiltrates are seen in right parahilar region. There are no signs of alveolar pulmonary edema. There is no significant pleural effusion or pneumothorax. IMPRESSION: There is interval clearing of pulmonary vascular congestion. Small residual patchy densities are seen in right parahilar region suggesting  residual scarring or subsegmental atelectasis. There is no focal pulmonary consolidation. Large fixed hiatal hernia. Electronically Signed   By: Elmer Picker M.D.   On: 07/24/2022 14:55    Procedures Procedures   Medications Ordered in ED Medications  iohexol (OMNIPAQUE) 300 MG/ML solution 80 mL (has no administration in time range)  sodium chloride 0.9 % bolus 1,000 mL (1,000 mLs Intravenous New Bag/Given 07/24/22 1422)  HYDROcodone-acetaminophen (NORCO/VICODIN) 5-325 MG per tablet 1 tablet (1 tablet Oral Given 07/24/22 1422)    ED Course/ Medical Decision Making/ A&P  Medical Decision Making Amount and/or Complexity of Data Reviewed Labs: ordered. Radiology: ordered.  Risk Prescription drug management.   89 year old female presents to the ED for evaluation.  Please see HPI for further details.  On examination the patient is afebrile, nontachycardic.  Lung sounds are clear bilaterally, her oxygen saturation is 94% on the monitor in the room.  Abdomen is soft and compressible however the patient does have tenderness to her right upper and lower quadrants which is nonfocal.  Patient oriented x 3.  No lower extremity edema.  CBC with leukocytosis of 23.2.  4 days ago the patient white blood cell count was 4.  Hemoglobin elevated at 16.2.  Lipase within normal limits.  Lactic acid 1.5.  CMP with decreased sodium 132, creatinine of 1.2 which is baseline.  Urinalysis pending.  Patient provided 5 mg hydrocodone for pain, 1 L fluid.  Patient does have history of CHF however ejection fraction 65% on recent echocardiogram.  Chest x-ray does not show any evidence of consolidations.  Patient CT scan of abdomen pelvis pending at this time.  Patient will be signed out to oncoming provider Charmaine Downs, PA-C for further management.   Final Clinical Impression(s) / ED Diagnoses Final diagnoses:  Abdominal pain, unspecified abdominal location    Rx / DC Orders ED Discharge Orders     None          Azucena Cecil, PA-C 07/24/22 1534    Charlesetta Shanks, MD 07/24/22 1557

## 2022-07-24 NOTE — ED Triage Notes (Signed)
BIB Ems for generalized abd pain x3 days worse today. Pt had BM last night and this morning. Pain is worse on left lower side today. Denies any nausea or vomiting.

## 2022-07-24 NOTE — ED Provider Notes (Signed)
Accepted handoff at shift change from [facility]. Please see prior provider note for more detail.   Briefly: Patient is 89 y.o. presenting to the ED complaining of right-sided abdominal pain that began yesterday morning and has been persisting since.  Denies nausea, vomiting, diarrhea, fevers.    DDX: concern for acute cholecystitis, hepatitis, appendicitis  Plan: Obtain CT of abdomen pelvis and determine disposition based on results   Results for orders placed or performed during the hospital encounter of 07/24/22  CBC with Differential  Result Value Ref Range   WBC 23.2 (H) 4.0 - 10.5 K/uL   RBC 5.06 3.87 - 5.11 MIL/uL   Hemoglobin 16.2 (H) 12.0 - 15.0 g/dL   HCT 47.8 (H) 36.0 - 46.0 %   MCV 94.5 80.0 - 100.0 fL   MCH 32.0 26.0 - 34.0 pg   MCHC 33.9 30.0 - 36.0 g/dL   RDW 13.3 11.5 - 15.5 %   Platelets 402 (H) 150 - 400 K/uL   nRBC 0.0 0.0 - 0.2 %   Neutrophils Relative % 84 %   Neutro Abs 19.4 (H) 1.7 - 7.7 K/uL   Lymphocytes Relative 7 %   Lymphs Abs 1.6 0.7 - 4.0 K/uL   Monocytes Relative 8 %   Monocytes Absolute 1.8 (H) 0.1 - 1.0 K/uL   Eosinophils Relative 0 %   Eosinophils Absolute 0.1 0.0 - 0.5 K/uL   Basophils Relative 0 %   Basophils Absolute 0.1 0.0 - 0.1 K/uL   Immature Granulocytes 1 %   Abs Immature Granulocytes 0.27 (H) 0.00 - 0.07 K/uL  Comprehensive metabolic panel  Result Value Ref Range   Sodium 132 (L) 135 - 145 mmol/L   Potassium 3.9 3.5 - 5.1 mmol/L   Chloride 91 (L) 98 - 111 mmol/L   CO2 28 22 - 32 mmol/L   Glucose, Bld 129 (H) 70 - 99 mg/dL   BUN 34 (H) 8 - 23 mg/dL   Creatinine, Ser 1.20 (H) 0.44 - 1.00 mg/dL   Calcium 9.4 8.9 - 10.3 mg/dL   Total Protein 7.2 6.5 - 8.1 g/dL   Albumin 3.7 3.5 - 5.0 g/dL   AST 43 (H) 15 - 41 U/L   ALT 47 (H) 0 - 44 U/L   Alkaline Phosphatase 38 38 - 126 U/L   Total Bilirubin 1.1 0.3 - 1.2 mg/dL   GFR, Estimated 43 (L) >60 mL/min   Anion gap 13 5 - 15  Lipase, blood  Result Value Ref Range   Lipase 22  11 - 51 U/L  Lactic acid, plasma  Result Value Ref Range   Lactic Acid, Venous 1.5 0.5 - 1.9 mmol/L   CT ABDOMEN PELVIS W CONTRAST  Result Date: 07/24/2022 CLINICAL DATA:  Right lower quadrant pain EXAM: CT ABDOMEN AND PELVIS WITH CONTRAST TECHNIQUE: Multidetector CT imaging of the abdomen and pelvis was performed using the standard protocol following bolus administration of intravenous contrast. RADIATION DOSE REDUCTION: This exam was performed according to the departmental dose-optimization program which includes automated exposure control, adjustment of the mA and/or kV according to patient size and/or use of iterative reconstruction technique. CONTRAST:  49mL OMNIPAQUE IOHEXOL 300 MG/ML  SOLN COMPARISON:  09/20/2017 FINDINGS: Lower chest: Moderate hiatal hernia involving the fundus and cardia, incompletely visualized, with some adjacent compressive atelectasis medially in both lung bases. No pleural or pericardial effusion. Heart size normal. Scattered ground-glass opacities in the visualized right lower lung. No pleural or pericardial effusion. Hepatobiliary: Marked dilatation of the  gallbladder containing stones measured up to 3.1 cm diameter. There is wall thickening and mucosal enhancement with marked surrounding edema and inflammation. There is no intrahepatic biliary ductal dilatation. No focal liver lesion. Pancreas: Unremarkable. No pancreatic ductal dilatation or surrounding inflammatory changes. Spleen: Normal in size without focal abnormality. Adrenals/Urinary Tract: Adrenal glands are unremarkable. Kidneys are normal, without renal calculi, focal lesion, or hydronephrosis. Bladder is physiologically distended. Stomach/Bowel: Large hiatal hernia involving gastric fundus and cardia. Dilated duodenal bulb, with focal narrowing in the proximal duodenum related to the distended inflamed gallbladder. There is gas and fluid distention of proximal small bowel loops. Distal small bowel decompressed,  without discrete transition point. There is gaseous distention of the cecum. There is tapered narrowing of the ascending colon related to mass effect from the gallbladder inflammation and distension. The more distal colon is nondilated with a few scattered sigmoid diverticula; no adjacent inflammatory change. Vascular/Lymphatic: Scattered aortoiliac calcified plaque without aneurysm. Portal vein patent. No abdominal or pelvic adenopathy. Reproductive: Status post hysterectomy. No adnexal masses. Other: No ascites.  No free air. Musculoskeletal: Moderate L1 compression deformity with approximately 20% loss of height anteriorly, and diffuse sclerosis, new since most recent radiograph 06/17/2021. Chronic L2 compression deformity stable. Grade 1 anterolisthesis L5-S1 as before. IMPRESSION: 1. Acute cholecystitis with cholelithiasis. Recommend surgical consultation. 2. Proximal small bowel distention suggesting ileus. 3. Large hiatal hernia. 4. L1 compression deformity, new since 06/17/2021. 5. Sigmoid diverticulosis. 6. Scattered ground-glass opacities in the visualized right lower lung, possibly infectious/inflammatory. Non-contrast chest CT at 3-6 months is recommended. If nodules persist, subsequent management will be based upon the most suspicious nodule(s). This recommendation follows the consensus statement: Guidelines for Management of Incidental Pulmonary Nodules Detected on CT Images:From the Fleischner Society 2017; published online before print (10.1148/radiol.SG:5268862). 7.  Aortic Atherosclerosis (ICD10-I70.0). Electronically Signed   By: Lucrezia Europe M.D.   On: 07/24/2022 16:07   DG Chest 2 View  Result Date: 07/24/2022 CLINICAL DATA:  Hypoxia, abdominal pain EXAM: CHEST - 2 VIEW COMPARISON:  07/09/2022 FINDINGS: Transverse diameter of heart is increased. There is large fixed hiatal hernia. There is significant interval improvement in aeration in both lungs. Small residual patchy infiltrates are seen in  right parahilar region. There are no signs of alveolar pulmonary edema. There is no significant pleural effusion or pneumothorax. IMPRESSION: There is interval clearing of pulmonary vascular congestion. Small residual patchy densities are seen in right parahilar region suggesting residual scarring or subsegmental atelectasis. There is no focal pulmonary consolidation. Large fixed hiatal hernia. Electronically Signed   By: Elmer Picker M.D.   On: 07/24/2022 14:55   ECHOCARDIOGRAM COMPLETE  Result Date: 07/09/2022    ECHOCARDIOGRAM REPORT   Patient Name:   Ellen Chandler Date of Exam: 07/09/2022 Medical Rec #:  [id number]         Height:       65.0 in Accession #:    [id number]        Weight:       138.7 lb Date of Birth:  [birth year]         BSA:          1.693 m Patient Age:    69 years          BP:           111/65 mmHg Patient Gender: F                 HR:  54 bpm. Exam Location:  Inpatient Procedure: 2D Echo, Cardiac Doppler and Color Doppler Indications:    acute systolic chf  History:        Patient has prior history of Echocardiogram examinations, most                 recent 01/04/2022. CHF, Arrythmias:Atrial Fibrillation; Risk                 Factors:Hypertension.  Sonographer:    Johny Chess RDCS Referring Phys: [id number] RATHORE  Sonographer Comments: Image acquisition challenging due to respiratory motion. IMPRESSIONS  1. Left ventricular ejection fraction, by estimation, is 55 to 60%. The left ventricle has normal function. The left ventricle has no regional wall motion abnormalities. There is mild left ventricular hypertrophy. Left ventricular diastolic parameters are consistent with Grade II diastolic dysfunction (pseudonormalization).  2. Right ventricular systolic function is normal. The right ventricular size is normal. There is normal pulmonary artery systolic pressure.  3. Left atrial size was severely dilated.  4. Mild mitral valve regurgitation.  5. The aortic valve  is tricuspid. Aortic valve regurgitation is mild. Comparison(s): The left ventricular function has improved. FINDINGS  Left Ventricle: Left ventricular ejection fraction, by estimation, is 55 to 60%. The left ventricle has normal function. The left ventricle has no regional wall motion abnormalities. The left ventricular internal cavity size was normal in size. There is  mild left ventricular hypertrophy. Left ventricular diastolic parameters are consistent with Grade II diastolic dysfunction (pseudonormalization). Right Ventricle: The right ventricular size is normal. Right vetricular wall thickness was not assessed. Right ventricular systolic function is normal. There is normal pulmonary artery systolic pressure. The tricuspid regurgitant velocity is 2.44 m/s, and with an assumed right atrial pressure of 3 mmHg, the estimated right ventricular systolic pressure is 0000000 mmHg. Left Atrium: Left atrial size was severely dilated. Right Atrium: Right atrial size was normal in size. Pericardium: There is no evidence of pericardial effusion. Mitral Valve: There is mild thickening of the mitral valve leaflet(s). There is mild calcification of the mitral valve leaflet(s). Mild mitral valve regurgitation. Tricuspid Valve: The tricuspid valve is normal in structure. Tricuspid valve regurgitation is trivial. Aortic Valve: The aortic valve is tricuspid. Aortic valve regurgitation is mild. Pulmonic Valve: The pulmonic valve was normal in structure. Pulmonic valve regurgitation is not visualized. Aorta: The aortic root and ascending aorta are structurally normal, with no evidence of dilitation. IAS/Shunts: No atrial level shunt detected by color flow Doppler.  LEFT VENTRICLE PLAX 2D LVIDd:         4.20 cm   Diastology LVIDs:         2.90 cm   LV e' medial:    4.90 cm/s LV PW:         1.00 cm   LV E/e' medial:  19.9 LV IVS:        1.20 cm   LV e' lateral:   7.94 cm/s LVOT diam:     1.80 cm   LV E/e' lateral: 12.3 LV SV:          41 LV SV Index:   24 LVOT Area:     2.54 cm  RIGHT VENTRICLE             IVC RV Basal diam:  2.60 cm     IVC diam: 1.40 cm RV S prime:     10.30 cm/s TAPSE (M-mode): 2.6 cm LEFT ATRIUM  Index        RIGHT ATRIUM           Index LA diam:        4.00 cm  2.36 cm/m   RA Area:     12.40 cm LA Vol (A2C):   52.0 ml  30.71 ml/m  RA Volume:   24.70 ml  14.59 ml/m LA Vol (A4C):   110.0 ml 64.97 ml/m LA Biplane Vol: 75.9 ml  44.83 ml/m  AORTIC VALVE LVOT Vmax:   89.00 cm/s LVOT Vmean:  54.800 cm/s LVOT VTI:    0.163 m  AORTA Ao Root diam: 3.30 cm Ao Asc diam:  3.30 cm MITRAL VALVE               TRICUSPID VALVE MV Area (PHT): 3.31 cm    TR Peak grad:   23.8 mmHg MV Decel Time: 229 msec    TR Vmax:        244.00 cm/s MV E velocity: 97.70 cm/s MV A velocity: 87.00 cm/s  SHUNTS MV E/A ratio:  1.12        Systemic VTI:  0.16 m                            Systemic Diam: 1.80 cm Dorris Carnes MD Electronically signed by Dorris Carnes MD Signature Date/Time: 07/09/2022/6:03:45 PM    Final    DG Chest Portable 1 View  Result Date: 07/09/2022 CLINICAL DATA:  Shortness of breath. EXAM: PORTABLE CHEST 1 VIEW COMPARISON:  02/03/2022. FINDINGS: The heart is enlarged and the mediastinal contour is stable. There is a large hiatal hernia. Pulmonary vascular congestion is present. Atherosclerotic calcification of the aorta is noted. Patchy perihilar airspace disease is noted bilaterally. No effusion or pneumothorax. No acute osseous abnormality. IMPRESSION: 1. Cardiomegaly with pulmonary vascular congestion. 2. Patchy perihilar airspace disease bilaterally suggesting edema. Correlate clinically to exclude infection. 3. Large hiatal hernia. Electronically Signed   By: Brett Fairy M.D.   On: 07/09/2022 02:16      Physical Exam  BP (!) 106/58   Pulse 78   Temp 97.6 F (36.4 C)   Resp 18   Ht 5\' 5"  (1.651 m)   Wt 63.5 kg   SpO2 91%   BMI 23.30 kg/m   Physical Exam Vitals and nursing note reviewed.  Constitutional:       General: She is not in acute distress.    Appearance: Normal appearance. She is not ill-appearing or diaphoretic.  Cardiovascular:     Rate and Rhythm: Normal rate and regular rhythm.  Pulmonary:     Effort: Pulmonary effort is normal.  Abdominal:     General: Abdomen is flat.     Palpations: Abdomen is soft.     Tenderness: There is abdominal tenderness in the right upper quadrant and right lower quadrant.  Skin:    General: Skin is warm and dry.     Capillary Refill: Capillary refill takes less than 2 seconds.  Neurological:     Mental Status: She is alert. Mental status is at baseline.  Psychiatric:        Mood and Affect: Mood normal.        Behavior: Behavior normal.     Procedures  Procedures  ED Course / MDM    Medical Decision Making Amount and/or Complexity of Data Reviewed Labs: ordered. Radiology: ordered.  Risk Prescription drug management.   Patient resting comfortably in  bed upon reassessment.  She states that she has minimal pain if she is not moving around.  Patient's CT with evidence of acute cholecystitis with cholelithiasis, largest stone is 3.1cm in diameter.    I requested consultation with general surgery and spoke with on-call provider, Greer Pickerel, who agreed to consult.  Dr. Redmond Pulling recommended admission to medicine with non-urgent risk stratification by cardiology for surgery.  Eliquis will be held also.   I requested consultation with on-call hospitalist provider and spoke with Emilee Hero who agreed with hospital admission.    Patient will be admitted to hospital for treatment/management of acute calculous cholecystitis.         Theressa Stamps R, [place] 07/24/22 1848    Kemper Durie, DO 07/26/22 1459

## 2022-07-24 NOTE — H&P (Signed)
History and Physical    LAIRA GIFT [id number] DOB: [birth year] DOA: 07/24/2022  PCP: Deland Pretty, MD   Chief Complaint: abd pain  HPI: Ellen Chandler is a 89 y.o. female with medical history significant of GERD, hypertension, hyperlipidemia, hypothyroidism who presented to the emergency department with right-sided abdominal pain.  Patient states that she had acute onset right-sided pain after eating yesterday.  Pain is persistent throughout the day.  She was worried that she was constipated so she took some MiraLAX.  Due to persistence of her pain she presented to the ER.  On arrival to the emergency department she was afebrile hemodynamically stable.  Labs were obtained on presentation which revealed WBC 23.2, hemoglobin 16.2, platelets 402, sodium 132, potassium 3.9, creatinine 1.2, AST 43, ALT 47, bilirubin 1.1, lipase 22, lactic acid 1.5.  Patient underwent chest x-ray which showed large hiatal hernia.  CT abdomen pelvis with contrast demonstrated acute cholecystitis with cholelithiasis and recommended surgical consultation.  There is also small bowel distention concerning for ileus.  General surgery was consulted for surgical intervention however patient remains Eliquis and will need 2-day washout.  On admission patient was endorsing vague right upper quadrant abdominal pain.  She denies infectious complaints including fevers or chills.  She denies prior history of biliary colic.  Regarding her cardiac history she had an echocardiogram on 07/09/2022 which showed preserved systolic function with grade 2 diastolic dysfunction.  She had a heart catheterization September 2023 which showed no obstructive coronary artery disease.  In addition, she was recently admitted to the hospital for decompensated diastolic heart failure.  She was diuresed and eventually discharged.   Review of Systems: Review of Systems  All other systems reviewed and are negative.    As per HPI otherwise 10 point  review of systems negative.   No Known Allergies  Past Medical History:  Diagnosis Date   Abnormal ECG    a. Pt aware of long hx of abnl ecg->h/o normal stress testing in [place] ~ 2000.   Diastolic dysfunction    a. 04/2013 Echo: EF 60-65%, No rwma, Gr 1 DD, mild MR.   GERD (gastroesophageal reflux disease)    Hypertension    Hypothyroidism     Past Surgical History:  Procedure Laterality Date   ABDOMINAL HYSTERECTOMY     JOINT REPLACEMENT     a. knee left - injury resulted after being attacked by a dog.   LEFT HEART CATH AND CORONARY ANGIOGRAPHY N/A 01/05/2022   Procedure: LEFT HEART CATH AND CORONARY ANGIOGRAPHY;  Surgeon: Belva Crome, MD;  Location: [facility];  Service: Cardiovascular;  Laterality: N/A;   RIGHT/LEFT HEART CATH AND CORONARY ANGIOGRAPHY N/A 01/09/2022   Procedure: RIGHT/LEFT HEART CATH AND CORONARY ANGIOGRAPHY;  Surgeon: Early Osmond, MD;  Location: [facility];  Service: Cardiovascular;  Laterality: N/A;     reports that she has never smoked. She has never used smokeless tobacco. She reports that she does not drink alcohol and does not use drugs.  Family History  Problem Relation Age of Onset   Heart attack Mother        died @ 78.   Diabetes type II Father        died @ 53.   Breast cancer Sister        died @ 108.    Prior to Admission medications   Medication Sig Start Date End Date Taking? Authorizing Provider  acetaminophen (TYLENOL) 500 MG tablet Take  1,000 mg by mouth every 6 (six) hours as needed for moderate pain. Patient not taking: Reported on 07/20/2022    [provider]  amiodarone (PACERONE) 200 MG tablet Take 1 tablet (200 mg total) by mouth daily. 02/14/22   Lyda Jester M, PA-C  apixaban (ELIQUIS) 5 MG TABS tablet Take 1 tablet (5 mg total) by mouth 2 (two) times daily. 02/14/22   Lyda Jester M, PA-C  dapagliflozin propanediol (FARXIGA) 10 MG TABS tablet Take 10 mg by mouth daily.     [provider]  furosemide (LASIX) 20 MG tablet Take 1 tablet (20 mg total) by mouth daily. 07/11/22 08/10/22  Arrien, Jimmy Picket, MD  lactose free nutrition (BOOST) LIQD Take 237 mLs by mouth at bedtime.    [provider]  levothyroxine (SYNTHROID, LEVOTHROID) 88 MCG tablet Take 88 mcg by mouth daily before breakfast.    [provider]  metoprolol succinate (TOPROL XL) 25 MG 24 hr tablet Take 0.5 tablets (12.5 mg total) by mouth daily. 07/20/22   Rafael Bihari, FNP  Multiple Vitamin (MULTIVITAMIN WITH MINERALS) TABS tablet Take 1 tablet by mouth daily.    [provider]  MYRBETRIQ 50 MG TB24 tablet Take 50 mg by mouth daily. 10/03/21   [provider]  pantoprazole (PROTONIX) 40 MG tablet Take 40 mg by mouth 2 (two) times daily. 09/18/21   [provider]  spironolactone (ALDACTONE) 25 MG tablet Take 0.5 tablets (12.5 mg total) by mouth daily. 07/20/22 08/19/22  Rafael Bihari, FNP    Physical Exam: Vitals:   07/24/22 1715 07/24/22 1800 07/24/22 1830 07/24/22 1838  BP: (!) 106/58 (!) 100/55 (!) 100/49   Pulse: 78 79 81 78  Resp: 18 20 (!) 21 20  Temp:      TempSrc:      SpO2: 91% 90% (S) (!) 88% (S) 95%  Weight:      Height:       Physical Exam Vitals reviewed.  Constitutional:      Appearance: She is normal weight.  HENT:     Mouth/Throat:     Mouth: Mucous membranes are moist.  Cardiovascular:     Rate and Rhythm: Normal rate and regular rhythm.     Heart sounds: Normal heart sounds.  Pulmonary:     Effort: Pulmonary effort is normal.     Breath sounds: Normal breath sounds.  Abdominal:     General: Abdomen is flat. Bowel sounds are normal.     Palpations: Abdomen is soft.  Skin:    General: Skin is warm.  Neurological:     General: No focal deficit present.     Mental Status: She is alert and oriented to person, place, and time.        Labs on Admission: I have personally reviewed the patients's labs  and imaging studies.  Assessment/Plan Principal Problem:   Acute cholecystitis -Patient presenting with 1 day of right upper quadrant pain found to have acute cholecystitis on CT - Remains on Eliquis for atrial fibrillation -Surgery consulted in the emergency department for operative intervention - Mildly elevated LFTs Plan: Placed on IV Zosyn Hold Eliquis Follow-up surgical recommendations regarding operative intervention  History of heart failure with preserved ejection fraction, not in exacerbation-continue home metoprolol, spironolactone, [place].  Hold p.o. Lasix while being evaluated for surgery  Paroxysmal A-fib-continue amiodarone, hold Eliquis  Urinary retention-continue Myrbetriq's  Hypothyroidism-continue levothyroxine  GERD-continue Protonix  Hyponatremia-trend BMP  CKD 3B-trend creatinine    Admission  status: Observation Med-Surg  Certification: The appropriate patient status for this patient is OBSERVATION. Observation status is judged to be reasonable and necessary in order to provide the required intensity of service to ensure the patient's safety. The patient's presenting symptoms, physical exam findings, and initial radiographic and laboratory data in the context of their medical condition is felt to place them at decreased risk for further clinical deterioration. Furthermore, it is anticipated that the patient will be medically stable for discharge from the hospital within 2 midnights of admission.     Emilee Hero MD [facility] If 7PM-7AM, please contact night-coverage [email or web address]  07/24/2022, 7:05 PM

## 2022-07-25 DIAGNOSIS — Z833 Family history of diabetes mellitus: Secondary | ICD-10-CM | POA: Diagnosis not present

## 2022-07-25 DIAGNOSIS — K219 Gastro-esophageal reflux disease without esophagitis: Secondary | ICD-10-CM | POA: Diagnosis present

## 2022-07-25 DIAGNOSIS — R0902 Hypoxemia: Secondary | ICD-10-CM | POA: Diagnosis present

## 2022-07-25 DIAGNOSIS — I5032 Chronic diastolic (congestive) heart failure: Secondary | ICD-10-CM | POA: Diagnosis not present

## 2022-07-25 DIAGNOSIS — E039 Hypothyroidism, unspecified: Secondary | ICD-10-CM | POA: Diagnosis present

## 2022-07-25 DIAGNOSIS — E440 Moderate protein-calorie malnutrition: Secondary | ICD-10-CM | POA: Diagnosis present

## 2022-07-25 DIAGNOSIS — I480 Paroxysmal atrial fibrillation: Secondary | ICD-10-CM | POA: Diagnosis present

## 2022-07-25 DIAGNOSIS — I130 Hypertensive heart and chronic kidney disease with heart failure and stage 1 through stage 4 chronic kidney disease, or unspecified chronic kidney disease: Secondary | ICD-10-CM | POA: Diagnosis present

## 2022-07-25 DIAGNOSIS — E871 Hypo-osmolality and hyponatremia: Secondary | ICD-10-CM | POA: Diagnosis not present

## 2022-07-25 DIAGNOSIS — E861 Hypovolemia: Secondary | ICD-10-CM | POA: Diagnosis not present

## 2022-07-25 DIAGNOSIS — R109 Unspecified abdominal pain: Secondary | ICD-10-CM | POA: Diagnosis not present

## 2022-07-25 DIAGNOSIS — E1122 Type 2 diabetes mellitus with diabetic chronic kidney disease: Secondary | ICD-10-CM | POA: Diagnosis present

## 2022-07-25 DIAGNOSIS — I700 Atherosclerosis of aorta: Secondary | ICD-10-CM | POA: Diagnosis present

## 2022-07-25 DIAGNOSIS — J9811 Atelectasis: Secondary | ICD-10-CM | POA: Diagnosis present

## 2022-07-25 DIAGNOSIS — N179 Acute kidney failure, unspecified: Secondary | ICD-10-CM | POA: Diagnosis not present

## 2022-07-25 DIAGNOSIS — K449 Diaphragmatic hernia without obstruction or gangrene: Secondary | ICD-10-CM | POA: Diagnosis present

## 2022-07-25 DIAGNOSIS — Z79890 Hormone replacement therapy (postmenopausal): Secondary | ICD-10-CM | POA: Diagnosis not present

## 2022-07-25 DIAGNOSIS — K8000 Calculus of gallbladder with acute cholecystitis without obstruction: Secondary | ICD-10-CM | POA: Diagnosis present

## 2022-07-25 DIAGNOSIS — Z8249 Family history of ischemic heart disease and other diseases of the circulatory system: Secondary | ICD-10-CM | POA: Diagnosis not present

## 2022-07-25 DIAGNOSIS — N1832 Chronic kidney disease, stage 3b: Secondary | ICD-10-CM | POA: Diagnosis present

## 2022-07-25 DIAGNOSIS — E860 Dehydration: Secondary | ICD-10-CM | POA: Diagnosis present

## 2022-07-25 DIAGNOSIS — K810 Acute cholecystitis: Secondary | ICD-10-CM | POA: Diagnosis present

## 2022-07-25 DIAGNOSIS — I5042 Chronic combined systolic (congestive) and diastolic (congestive) heart failure: Secondary | ICD-10-CM | POA: Diagnosis present

## 2022-07-25 DIAGNOSIS — Z66 Do not resuscitate: Secondary | ICD-10-CM | POA: Diagnosis present

## 2022-07-25 DIAGNOSIS — K5730 Diverticulosis of large intestine without perforation or abscess without bleeding: Secondary | ICD-10-CM | POA: Diagnosis present

## 2022-07-25 DIAGNOSIS — Z7901 Long term (current) use of anticoagulants: Secondary | ICD-10-CM | POA: Diagnosis not present

## 2022-07-25 DIAGNOSIS — E222 Syndrome of inappropriate secretion of antidiuretic hormone: Secondary | ICD-10-CM | POA: Diagnosis present

## 2022-07-25 DIAGNOSIS — E785 Hyperlipidemia, unspecified: Secondary | ICD-10-CM | POA: Diagnosis present

## 2022-07-25 LAB — PROTIME-INR
INR: 1.8 — ABNORMAL HIGH (ref 0.8–1.2)
Prothrombin Time: 21 seconds — ABNORMAL HIGH (ref 11.4–15.2)

## 2022-07-25 LAB — CBC
HCT: 43.2 % (ref 36.0–46.0)
Hemoglobin: 14.3 g/dL (ref 12.0–15.0)
MCH: 31.8 pg (ref 26.0–34.0)
MCHC: 33.1 g/dL (ref 30.0–36.0)
MCV: 96.2 fL (ref 80.0–100.0)
Platelets: 331 10*3/uL (ref 150–400)
RBC: 4.49 MIL/uL (ref 3.87–5.11)
RDW: 13.9 % (ref 11.5–15.5)
WBC: 18.5 10*3/uL — ABNORMAL HIGH (ref 4.0–10.5)
nRBC: 0 % (ref 0.0–0.2)

## 2022-07-25 LAB — COMPREHENSIVE METABOLIC PANEL
ALT: 59 U/L — ABNORMAL HIGH (ref 0–44)
AST: 51 U/L — ABNORMAL HIGH (ref 15–41)
Albumin: 2.8 g/dL — ABNORMAL LOW (ref 3.5–5.0)
Alkaline Phosphatase: 37 U/L — ABNORMAL LOW (ref 38–126)
Anion gap: 10 (ref 5–15)
BUN: 39 mg/dL — ABNORMAL HIGH (ref 8–23)
CO2: 27 mmol/L (ref 22–32)
Calcium: 8.5 mg/dL — ABNORMAL LOW (ref 8.9–10.3)
Chloride: 90 mmol/L — ABNORMAL LOW (ref 98–111)
Creatinine, Ser: 1.58 mg/dL — ABNORMAL HIGH (ref 0.44–1.00)
GFR, Estimated: 31 mL/min — ABNORMAL LOW (ref >60)
Glucose, Bld: 143 mg/dL — ABNORMAL HIGH (ref 70–99)
Potassium: 4.1 mmol/L (ref 3.5–5.1)
Sodium: 127 mmol/L — ABNORMAL LOW (ref 135–145)
Total Bilirubin: 0.6 mg/dL (ref 0.3–1.2)
Total Protein: 6.1 g/dL — ABNORMAL LOW (ref 6.5–8.1)

## 2022-07-25 LAB — SURGICAL PCR SCREEN
MRSA, PCR: NEGATIVE
Staphylococcus aureus: POSITIVE — AB

## 2022-07-25 MED ORDER — POTASSIUM CHLORIDE 2 MEQ/ML IV SOLN
INTRAVENOUS | Status: DC
  Filled 2022-07-25 (×6): qty 1000

## 2022-07-25 MED ORDER — SODIUM CHLORIDE 0.9 % IV SOLN
2.0000 g | INTRAVENOUS | Status: DC
  Administered 2022-07-25 – 2022-07-26 (×2): 2 g via INTRAVENOUS
  Filled 2022-07-25 (×2): qty 20

## 2022-07-25 MED ORDER — SODIUM CHLORIDE 0.9 % IV SOLN: INTRAVENOUS | Status: DC

## 2022-07-25 MED ORDER — SODIUM CHLORIDE 0.9 % IV BOLUS
500.0000 mL | Freq: Once | INTRAVENOUS | Status: AC
  Administered 2022-07-25: 500 mL via INTRAVENOUS

## 2022-07-25 MED ORDER — LIP MEDEX EX OINT
1.0000 | TOPICAL_OINTMENT | CUTANEOUS | Status: DC | PRN
  Administered 2022-07-27: 1 via TOPICAL
  Filled 2022-07-25 (×3): qty 7

## 2022-07-25 MED ORDER — METRONIDAZOLE 500 MG/100ML IV SOLN
500.0000 mg | Freq: Two times a day (BID) | INTRAVENOUS | Status: DC
  Administered 2022-07-25 – 2022-07-29 (×8): 500 mg via INTRAVENOUS
  Filled 2022-07-25 (×8): qty 100

## 2022-07-25 NOTE — Assessment & Plan Note (Addendum)
BP soft - Hold metoprolol, furos - Continue spironolactone

## 2022-07-25 NOTE — Assessment & Plan Note (Addendum)
HR controlled - Hold metoprolol - Resume Eliquis - Continue amiodarone

## 2022-07-25 NOTE — Assessment & Plan Note (Signed)
Continue levothyroxine 

## 2022-07-25 NOTE — Assessment & Plan Note (Addendum)
Na slightly up.   Appears euvolemic.  Una and Uosm implies combination SIADH and hypovolemia - Hold furosemide - Continue sodium tabs

## 2022-07-25 NOTE — Progress Notes (Signed)
  Progress Note   Patient: Ellen Chandler [id number] DOB: 1931/04/27 DOA: 07/24/2022     0 DOS: the patient was seen and examined on 07/25/2022 at 11:30 AM      Brief hospital course: Ellen Chandler is an 89 y.o. F with dCHF, recent admit for CHF, HTN, hypothyroidism and pAF on Eliquis who presented with acute right upper quadrant abdominal pain.  In the ER, WBC 23K, LFTs slightly up, CT showed acute cholecystitis with cholelithiasis.     3/19: Admitted, General Surgery consutled, recommended surgery after Eliquis washout 3/20: IR consulted     Assessment and Plan: * Acute cholecystitis - Continue antibiotics, narrow to non-PsA coverage - Consult IR for perc tube - Appreciate Surgical expertise    AKI (acute kidney injury) ([place]) Baseline Cr 0.7-0/9 last fall.  Up to 1.1-1.2 this March, but >1.5 on admission.  Worsening in the hospital. - Continue IV fluids - Avoid nephrotoxins and hypotension   Hyponatremia Na slightly worse, appears hypovolemic. - Continue IV fluids - Trend Na  Paroxysmal A-fib (H[facility]) Tachycardic at present - Hold Eliquis - Continue amiodarone and metoprolol  Hypothyroidism - Continue levothyroxine  Chronic diastolic CHF (congestive heart failure) (HCC) Appears dehydrated - Hold spiro and furos  Essential hypertension BP normal - Continue metoprolol - Hold furos and spiro          Subjective: Patient feels generalized malaise, still a lot of abdominal pain, weak and poor oral intake, very nauseated.  General surgery plan for IR consultation.     Physical Exam: BP 138/81 (BP Location: Right Arm)   Pulse 100   Temp 98 F (36.7 C) (Oral)   Resp 19   Ht 5\' 5"  (1.651 m)   Wt 63.5 kg   SpO2 93%   BMI 23.30 kg/m   Frail elderly female, lying in bed, weak and tired, mucous membranes appear dry Tachycardic, sounds regular actually, no murmurs, no peripheral edema Respiratory rate normal, lungs clear without rales or  wheezes Substantial abdominal tenderness, voluntary guarding, pain is worse in the right, no rigidity or rebound Attentive to conversation, affect blunted by pain, oriented to person, place, and time, moves upper extremities with generalized weakness but symmetric strength, face symmetric, speech fluent   Data Reviewed: Discussed with general surgery Basic metabolic panel normal for slightly worse hyponatremia, creatinine up to 1.58 CBC notable for white blood cell count down slightly to 18,000 CT abdomen report reviewed and summarized above    Family Communication: Son and daughter-in-law at the bedside    Disposition: Status is: Inpatient Patient is admitted for cholecystitis, she requires ongoing IV antibiotics and IV fluids, she will have a percutaneous drain, likely will need SNF at the end        Author: Edwin Dada, MD 07/25/2022 1:53 PM  For on call review [email or web address].

## 2022-07-25 NOTE — Assessment & Plan Note (Addendum)
Today some wheezing.  Responded to albuterol.  CXR clear, clinically not volume overloaded, doubt CHF - Okay to resume spironolactone and furosemide

## 2022-07-25 NOTE — Progress Notes (Signed)
Progress Note     Subjective: Pt reports right sided abdominal pain. Some nausea and does not feel hungry. She denies urinary symptoms and is passing some flatus. No family at bedside early this AM but she reports her daughter in law was on her way in.   Objective: Vital signs in last 24 hours: Temp:  [97.4 F (36.3 C)-98 F (36.7 C)] 97.5 F (36.4 C) (03/20 0917) Pulse Rate:  [74-102] 102 (03/20 0917) Resp:  [16-22] 18 (03/20 0917) BP: (86-121)/(49-63) 112/63 (03/20 0917) SpO2:  [88 %-96 %] 92 % (03/20 0917) Weight:  [63.5 kg] 63.5 kg (03/19 1310) Last BM Date : 07/24/22  Intake/Output from previous day: 03/19 0701 - 03/20 0700 In: 455.8 [P.O.:360; IV Piggyback:95.8] Out: 0  Intake/Output this shift: Total I/O In: 240 [P.O.:240] Out: 0   PE: General: pleasant, WD, elderly female who is laying in bed and appears to not feel well HEENT: sclera anicteric  Heart: regular, rate, and rhythm.   Lungs:   Respiratory effort nonlabored Abd: soft, ttp along right abdomen and epigastrium, ND, +BS   Lab Results:  Recent Labs    07/24/22 1325 07/25/22 0440  WBC 23.2* 18.5*  HGB 16.2* 14.3  HCT 47.8* 43.2  PLT 402* 331   BMET Recent Labs    07/24/22 1325 07/25/22 0440  NA 132* 127*  K 3.9 4.1  CL 91* 90*  CO2 28 27  GLUCOSE 129* 143*  BUN 34* 39*  CREATININE 1.20* 1.58*  CALCIUM 9.4 8.5*   PT/INR Recent Labs    07/25/22 0440  LABPROT 21.0*  INR 1.8*   CMP     Component Value Date/Time   NA 127 (L) 07/25/2022 0440   K 4.1 07/25/2022 0440   CL 90 (L) 07/25/2022 0440   CO2 27 07/25/2022 0440   GLUCOSE 143 (H) 07/25/2022 0440   BUN 39 (H) 07/25/2022 0440   CREATININE 1.58 (H) 07/25/2022 0440   CALCIUM 8.5 (L) 07/25/2022 0440   PROT 6.1 (L) 07/25/2022 0440   ALBUMIN 2.8 (L) 07/25/2022 0440   AST 51 (H) 07/25/2022 0440   ALT 59 (H) 07/25/2022 0440   ALKPHOS 37 (L) 07/25/2022 0440   BILITOT 0.6 07/25/2022 0440   GFRNONAA 31 (L) 07/25/2022 0440    GFRAA >60 09/20/2017 0602   Lipase     Component Value Date/Time   LIPASE 22 07/24/2022 1325       Studies/Results: CT ABDOMEN PELVIS W CONTRAST  Result Date: 07/24/2022 CLINICAL DATA:  Right lower quadrant pain EXAM: CT ABDOMEN AND PELVIS WITH CONTRAST TECHNIQUE: Multidetector CT imaging of the abdomen and pelvis was performed using the standard protocol following bolus administration of intravenous contrast. RADIATION DOSE REDUCTION: This exam was performed according to the departmental dose-optimization program which includes automated exposure control, adjustment of the mA and/or kV according to patient size and/or use of iterative reconstruction technique. CONTRAST:  105mL OMNIPAQUE IOHEXOL 300 MG/ML  SOLN COMPARISON:  09/20/2017 FINDINGS: Lower chest: Moderate hiatal hernia involving the fundus and cardia, incompletely visualized, with some adjacent compressive atelectasis medially in both lung bases. No pleural or pericardial effusion. Heart size normal. Scattered ground-glass opacities in the visualized right lower lung. No pleural or pericardial effusion. Hepatobiliary: Marked dilatation of the gallbladder containing stones measured up to 3.1 cm diameter. There is wall thickening and mucosal enhancement with marked surrounding edema and inflammation. There is no intrahepatic biliary ductal dilatation. No focal liver lesion. Pancreas: Unremarkable. No pancreatic ductal dilatation or surrounding  inflammatory changes. Spleen: Normal in size without focal abnormality. Adrenals/Urinary Tract: Adrenal glands are unremarkable. Kidneys are normal, without renal calculi, focal lesion, or hydronephrosis. Bladder is physiologically distended. Stomach/Bowel: Large hiatal hernia involving gastric fundus and cardia. Dilated duodenal bulb, with focal narrowing in the proximal duodenum related to the distended inflamed gallbladder. There is gas and fluid distention of proximal small bowel loops. Distal small  bowel decompressed, without discrete transition point. There is gaseous distention of the cecum. There is tapered narrowing of the ascending colon related to mass effect from the gallbladder inflammation and distension. The more distal colon is nondilated with a few scattered sigmoid diverticula; no adjacent inflammatory change. Vascular/Lymphatic: Scattered aortoiliac calcified plaque without aneurysm. Portal vein patent. No abdominal or pelvic adenopathy. Reproductive: Status post hysterectomy. No adnexal masses. Other: No ascites.  No free air. Musculoskeletal: Moderate L1 compression deformity with approximately 20% loss of height anteriorly, and diffuse sclerosis, new since most recent radiograph 06/17/2021. Chronic L2 compression deformity stable. Grade 1 anterolisthesis L5-S1 as before. IMPRESSION: 1. Acute cholecystitis with cholelithiasis. Recommend surgical consultation. 2. Proximal small bowel distention suggesting ileus. 3. Large hiatal hernia. 4. L1 compression deformity, new since 06/17/2021. 5. Sigmoid diverticulosis. 6. Scattered ground-glass opacities in the visualized right lower lung, possibly infectious/inflammatory. Non-contrast chest CT at 3-6 months is recommended. If nodules persist, subsequent management will be based upon the most suspicious nodule(s). This recommendation follows the consensus statement: Guidelines for Management of Incidental Pulmonary Nodules Detected on CT Images:From the Fleischner Society 2017; published online before print (10.1148/radiol.0102725366). 7.  Aortic Atherosclerosis (ICD10-I70.0). Electronically Signed   By: Lucrezia Europe M.D.   On: 07/24/2022 16:07   DG Chest 2 View  Result Date: 07/24/2022 CLINICAL DATA:  Hypoxia, abdominal pain EXAM: CHEST - 2 VIEW COMPARISON:  07/09/2022 FINDINGS: Transverse diameter of heart is increased. There is large fixed hiatal hernia. There is significant interval improvement in aeration in both lungs. Small residual patchy  infiltrates are seen in right parahilar region. There are no signs of alveolar pulmonary edema. There is no significant pleural effusion or pneumothorax. IMPRESSION: There is interval clearing of pulmonary vascular congestion. Small residual patchy densities are seen in right parahilar region suggesting residual scarring or subsegmental atelectasis. There is no focal pulmonary consolidation. Large fixed hiatal hernia. Electronically Signed   By: Elmer Picker M.D.   On: 07/24/2022 14:55    Anti-infectives: Anti-infectives (From admission, onward)    Start     Dose/Rate Route Frequency Ordered Stop   07/24/22 2000  piperacillin-tazobactam (ZOSYN) IVPB 3.375 g        3.375 g 12.5 mL/hr over 240 Minutes Intravenous Every 8 hours 07/24/22 1856     07/24/22 1815  cefTRIAXone (ROCEPHIN) 2 g in sodium chloride 0.9 % 100 mL IVPB        2 g 200 mL/hr over 30 Minutes Intravenous  Once 07/24/22 1800 07/24/22 1859        Assessment/Plan  Acute cholecystitis  - CT yesterday with significant gallbladder wall thickening and surrounding inflammation and edema, dilated duodenal bulb likely secondary to inflamed gallbladder as well - Tbili WNL and AST/ALT 51/59 from 43/47 - WBC 18 from 23, afebrile - given degree of inflammation and being on blood thinners at baseline, would recommend IR percutaneous cholecystostomy at this time - can determine candidacy for interval lap chole at future follow up appointment, but no plans for immediate surgical intervention - we will follow    FEN: NPO, IVF per [facility] VTE:  Eliquis on hold ID: Zosyn 3/19>>  - below per [facility] -  HTN PAF on Eliquis Hypothyroidism Combined chronic CHF Large hiatal hernia  Hyponatremia    LOS: 0 days   I reviewed hospitalist notes, last 24 h vitals and pain scores, last 48 h intake and output, last 24 h labs and trends, last 24 h imaging results, and discussed patient with hospitalist and IR .    Norm Parcel, [facility] 07/25/2022, 11:00 AM Please see Amion for pager number during day hours 7:00am-4:30pm

## 2022-07-25 NOTE — Assessment & Plan Note (Addendum)
WBC improved, still in a lot of pain.  Aspirate from drain growing Citrobacter - Continue Flagyl - Stop Rocephin, change to Cipro

## 2022-07-25 NOTE — Hospital Course (Addendum)
Ellen Chandler is an 89 y.o. F with dCHF, recent admit for CHF, HTN, hypothyroidism and pAF on Eliquis who presented with acute right upper quadrant abdominal pain.  In the ER, WBC 23K, LFTs slightly up, CT showed acute cholecystitis with cholelithiasis.     3/19: Admitted, General Surgery consutled, recommended surgery after Eliquis washout 3/20: IR consulted 3/21: Percutaneous drain placed 3/23: Medical work up complete

## 2022-07-25 NOTE — Assessment & Plan Note (Addendum)
Resolved with fluids

## 2022-07-25 NOTE — Consult Note (Cosign Needed Addendum)
Chief Complaint: Patient was seen in consultation today for percutaneous cholecystostomy Chief Complaint  Patient presents with   Abdominal Pain    Referring Physician(s): Benavides  Supervising Physician: Henn,A  Patient Status: [facility] - In-pt  History of Present Illness: Ellen Chandler is an 89 y.o. female with PMH CHF, GERD, HTN, hypothyroidism, recent diagnosis of PAF on eliquis (LD 3/18 pm), prior hysterectomy who presented to the [facility] ED on 07/24/22 with right-sided abdominal pain. Patient states that she had acute onset right-sided pain after eating 3/18.  Pain is persistent throughout the day. She was worried that she was constipated so she took some MiraLAX. Due to persistence of her pain she presented to the ER. On arrival to the emergency department she was afebrile ,hemodynamically stable. Labs were obtained on presentation which revealed WBC 23.2, hemoglobin 16.2, platelets 402, sodium 132, potassium 3.9, creatinine 1.2, AST 43, ALT 47, bilirubin 1.1, lipase 22, lactic acid 1.5. Patient underwent chest x-ray which showed large hiatal hernia. CT abdomen pelvis on 3/19 revealed:   1. Acute cholecystitis with cholelithiasis. Recommend surgical consultation. 2. Proximal small bowel distention suggesting ileus. 3. Large hiatal hernia. 4. L1 compression deformity, new since 06/17/2021. 5. Sigmoid diverticulosis. 6. Scattered ground-glass opacities in the visualized right lower lung, possibly infectious/inflammatory. Non-contrast chest CT at 3-6 months is recommended. If nodules persist, subsequent management will be based upon the most suspicious nodule(s). 7. Aortic Atherosclerosis    Pt currently afebrile, sl tachycardic at 102, WBC 18.5(23.2), hgb nl, plts nl, creat 1.58, t bili nl, other LFT's sl elevated; PT 21 INR 1.8. On IV Flagyl/Rocephin.  Request now received from [facility] for perc cholecystostomy.     Past Medical History:  Diagnosis Date   Abnormal ECG    a. Pt  aware of long hx of abnl ecg->h/o normal stress testing in [place] ~ 2000.   Diastolic dysfunction    a. 04/2013 Echo: EF 60-65%, No rwma, Gr 1 DD, mild MR.   GERD (gastroesophageal reflux disease)    Hypertension    Hypothyroidism     Past Surgical History:  Procedure Laterality Date   ABDOMINAL HYSTERECTOMY     JOINT REPLACEMENT     a. knee left - injury resulted after being attacked by a dog.   LEFT HEART CATH AND CORONARY ANGIOGRAPHY N/A 01/05/2022   Procedure: LEFT HEART CATH AND CORONARY ANGIOGRAPHY;  Surgeon: Belva Crome, MD;  Location: [place] [facility];  Service: Cardiovascular;  Laterality: N/A;   RIGHT/LEFT HEART CATH AND CORONARY ANGIOGRAPHY N/A 01/09/2022   Procedure: RIGHT/LEFT HEART CATH AND CORONARY ANGIOGRAPHY;  Surgeon: Early Osmond, MD;  Location: [place] [facility];  Service: Cardiovascular;  Laterality: N/A;    Allergies: Patient has no known allergies.  Medications: Prior to Admission medications   Medication Sig Start Date End Date Taking? Authorizing Provider  acetaminophen (TYLENOL) 500 MG tablet Take 500-1,000 mg by mouth every 6 (six) hours as needed for moderate pain.   Yes [provider]  amiodarone (PACERONE) 200 MG tablet Take 1 tablet (200 mg total) by mouth daily. Patient taking differently: Take 200 mg by mouth in the morning. 02/14/22  Yes Lyda Jester M, PA-C  amoxicillin (AMOXIL) 500 MG capsule Take 2,000 mg by mouth See admin instructions. Take 2,000 mg by mouth one hour prior to dental appointments   Yes [provider]  apixaban (ELIQUIS) 5 MG TABS tablet Take 1 tablet (5 mg total) by mouth 2 (two) times daily.  02/14/22  Yes Lyda Jester M, PA-C  dapagliflozin propanediol (FARXIGA) 10 MG TABS tablet Take 10 mg by mouth daily.   Yes [provider]  furosemide (LASIX) 20 MG tablet Take 1 tablet (20 mg total) by mouth daily. 07/11/22 08/10/22 Yes Arrien, Jimmy Picket, MD  lactose free nutrition  (BOOST) LIQD Take 237 mLs by mouth at bedtime.   Yes [provider]  levothyroxine (SYNTHROID, LEVOTHROID) 88 MCG tablet Take 88 mcg by mouth daily before breakfast.   Yes [provider]  metoprolol succinate (TOPROL XL) 25 MG 24 hr tablet Take 0.5 tablets (12.5 mg total) by mouth daily. 07/20/22  Yes Milford, Maricela Bo, FNP  Multiple Vitamins-Minerals (ONE-A-DAY WOMENS 50+ ADVANTAGE) TABS Take 1 tablet by mouth daily with breakfast.   Yes [provider]  MYRBETRIQ 50 MG TB24 tablet Take 50 mg by mouth in the morning. 10/03/21  Yes [provider]  pantoprazole (PROTONIX) 40 MG tablet Take 40 mg by mouth 2 (two) times daily. 09/18/21  Yes [provider]  polyethylene glycol powder (GLYCOLAX/MIRALAX) 17 GM/SCOOP powder Take 17 g by mouth daily as needed for mild constipation.   Yes [provider]  spironolactone (ALDACTONE) 25 MG tablet Take 0.5 tablets (12.5 mg total) by mouth daily. 07/20/22 08/19/22 Yes Milford, Maricela Bo, FNP     Family History  Problem Relation Age of Onset   Heart attack Mother        died @ 57.   Diabetes type II Father        died @ 51.   Breast cancer Sister        died @ 57.    Social History   Socioeconomic History   Marital status: Widowed    Spouse name: Not on file   Number of children: 4   Years of education: Not on file   Highest education level: High school graduate  Occupational History   Occupation: Retired  Tobacco Use   Smoking status: Never   Smokeless tobacco: Never  Vaping Use   Vaping Use: Never used  Substance and Sexual Activity   Alcohol use: Never   Drug use: No   Sexual activity: Not on file  Other Topics Concern   Not on file  Social History Narrative   Moved to [place] from [place] in June 2014.  Lives at [facility].  Was exercising daily.   Social Determinants of Health   Financial Resource Strain: Low Risk  (07/10/2022)   Overall Financial  Resource Strain (CARDIA)    Difficulty of Paying Living Expenses: Not hard at all  Food Insecurity: No Food Insecurity (07/24/2022)   Hunger Vital Sign    Worried About Running Out of Food in the Last Year: Never true    Ran Out of Food in the Last Year: Never true  Transportation Needs: No Transportation Needs (07/24/2022)   PRAPARE - Hydrologist (Medical): No    Lack of Transportation (Non-Medical): No  Physical Activity: Not on file  Stress: Not on file  Social Connections: Not on file      Review of Systems currently denies fever, chest pain, worsening dyspnea, nausea, vomiting bleeding; remains weak, has a mild headache as well as moderate RUQ /epigastric comfort  Vital Signs: BP 112/63 (BP Location: Right Arm)   Pulse (!) 102   Temp (!) 97.5 F (36.4 C) (Oral)   Resp 18   Ht 5\' 5"  (1.651 m)  Wt 140 lb (63.5 kg)   SpO2 92%   BMI 23.30 kg/m     Physical Exam patient drowsy but arousable.  Appears weak.  Family in room.  Chest with some slightly diminished breath sounds bases.  Heart with slightly tachycardic but regular rhythm.  Abdomen soft, few bowel sounds, mod tender epigastric /right upper quadrant regions ; no LE edma  Imaging: CT ABDOMEN PELVIS W CONTRAST  Result Date: 07/24/2022 CLINICAL DATA:  Right lower quadrant pain EXAM: CT ABDOMEN AND PELVIS WITH CONTRAST TECHNIQUE: Multidetector CT imaging of the abdomen and pelvis was performed using the standard protocol following bolus administration of intravenous contrast. RADIATION DOSE REDUCTION: This exam was performed according to the departmental dose-optimization program which includes automated exposure control, adjustment of the mA and/or kV according to patient size and/or use of iterative reconstruction technique. CONTRAST:  6mL OMNIPAQUE IOHEXOL 300 MG/ML  SOLN COMPARISON:  09/20/2017 FINDINGS: Lower chest: Moderate hiatal hernia involving the fundus and cardia, incompletely  visualized, with some adjacent compressive atelectasis medially in both lung bases. No pleural or pericardial effusion. Heart size normal. Scattered ground-glass opacities in the visualized right lower lung. No pleural or pericardial effusion. Hepatobiliary: Marked dilatation of the gallbladder containing stones measured up to 3.1 cm diameter. There is wall thickening and mucosal enhancement with marked surrounding edema and inflammation. There is no intrahepatic biliary ductal dilatation. No focal liver lesion. Pancreas: Unremarkable. No pancreatic ductal dilatation or surrounding inflammatory changes. Spleen: Normal in size without focal abnormality. Adrenals/Urinary Tract: Adrenal glands are unremarkable. Kidneys are normal, without renal calculi, focal lesion, or hydronephrosis. Bladder is physiologically distended. Stomach/Bowel: Large hiatal hernia involving gastric fundus and cardia. Dilated duodenal bulb, with focal narrowing in the proximal duodenum related to the distended inflamed gallbladder. There is gas and fluid distention of proximal small bowel loops. Distal small bowel decompressed, without discrete transition point. There is gaseous distention of the cecum. There is tapered narrowing of the ascending colon related to mass effect from the gallbladder inflammation and distension. The more distal colon is nondilated with a few scattered sigmoid diverticula; no adjacent inflammatory change. Vascular/Lymphatic: Scattered aortoiliac calcified plaque without aneurysm. Portal vein patent. No abdominal or pelvic adenopathy. Reproductive: Status post hysterectomy. No adnexal masses. Other: No ascites.  No free air. Musculoskeletal: Moderate L1 compression deformity with approximately 20% loss of height anteriorly, and diffuse sclerosis, new since most recent radiograph 06/17/2021. Chronic L2 compression deformity stable. Grade 1 anterolisthesis L5-S1 as before. IMPRESSION: 1. Acute cholecystitis with  cholelithiasis. Recommend surgical consultation. 2. Proximal small bowel distention suggesting ileus. 3. Large hiatal hernia. 4. L1 compression deformity, new since 06/17/2021. 5. Sigmoid diverticulosis. 6. Scattered ground-glass opacities in the visualized right lower lung, possibly infectious/inflammatory. Non-contrast chest CT at 3-6 months is recommended. If nodules persist, subsequent management will be based upon the most suspicious nodule(s). This recommendation follows the consensus statement: Guidelines for Management of Incidental Pulmonary Nodules Detected on CT Images:From the Fleischner Society 2017; published online before print (10.1148/radiol.SG:5268862). 7.  Aortic Atherosclerosis (ICD10-I70.0). Electronically Signed   By: Lucrezia Europe M.D.   On: 07/24/2022 16:07   DG Chest 2 View  Result Date: 07/24/2022 CLINICAL DATA:  Hypoxia, abdominal pain EXAM: CHEST - 2 VIEW COMPARISON:  07/09/2022 FINDINGS: Transverse diameter of heart is increased. There is large fixed hiatal hernia. There is significant interval improvement in aeration in both lungs. Small residual patchy infiltrates are seen in right parahilar region. There are no signs of alveolar pulmonary edema. There is  no significant pleural effusion or pneumothorax. IMPRESSION: There is interval clearing of pulmonary vascular congestion. Small residual patchy densities are seen in right parahilar region suggesting residual scarring or subsegmental atelectasis. There is no focal pulmonary consolidation. Large fixed hiatal hernia. Electronically Signed   By: Elmer Picker M.D.   On: 07/24/2022 14:55   ECHOCARDIOGRAM COMPLETE  Result Date: 07/09/2022    ECHOCARDIOGRAM REPORT   Patient Name:   Ellen Chandler Matheny Date of Exam: 07/09/2022 Medical Rec #:  [id number]         Height:       65.0 in Accession #:    [id number]        Weight:       138.7 lb Date of Birth:  Jul 24, 1930         BSA:          1.693 m Patient Age:    53 years          BP:            111/65 mmHg Patient Gender: F                 HR:           54 bpm. Exam Location:  Inpatient Procedure: 2D Echo, Cardiac Doppler and Color Doppler Indications:    acute systolic chf  History:        Patient has prior history of Echocardiogram examinations, most                 recent 01/04/2022. CHF, Arrythmias:Atrial Fibrillation; Risk                 Factors:Hypertension.  Sonographer:    Johny Chess RDCS Referring Phys: [id number] RATHORE  Sonographer Comments: Image acquisition challenging due to respiratory motion. IMPRESSIONS  1. Left ventricular ejection fraction, by estimation, is 55 to 60%. The left ventricle has normal function. The left ventricle has no regional wall motion abnormalities. There is mild left ventricular hypertrophy. Left ventricular diastolic parameters are consistent with Grade II diastolic dysfunction (pseudonormalization).  2. Right ventricular systolic function is normal. The right ventricular size is normal. There is normal pulmonary artery systolic pressure.  3. Left atrial size was severely dilated.  4. Mild mitral valve regurgitation.  5. The aortic valve is tricuspid. Aortic valve regurgitation is mild. Comparison(s): The left ventricular function has improved. FINDINGS  Left Ventricle: Left ventricular ejection fraction, by estimation, is 55 to 60%. The left ventricle has normal function. The left ventricle has no regional wall motion abnormalities. The left ventricular internal cavity size was normal in size. There is  mild left ventricular hypertrophy. Left ventricular diastolic parameters are consistent with Grade II diastolic dysfunction (pseudonormalization). Right Ventricle: The right ventricular size is normal. Right vetricular wall thickness was not assessed. Right ventricular systolic function is normal. There is normal pulmonary artery systolic pressure. The tricuspid regurgitant velocity is 2.44 m/s, and with an assumed right atrial pressure of 3  mmHg, the estimated right ventricular systolic pressure is 0000000 mmHg. Left Atrium: Left atrial size was severely dilated. Right Atrium: Right atrial size was normal in size. Pericardium: There is no evidence of pericardial effusion. Mitral Valve: There is mild thickening of the mitral valve leaflet(s). There is mild calcification of the mitral valve leaflet(s). Mild mitral valve regurgitation. Tricuspid Valve: The tricuspid valve is normal in structure. Tricuspid valve regurgitation is trivial. Aortic Valve: The aortic valve is tricuspid. Aortic valve regurgitation is mild. Pulmonic Valve:  The pulmonic valve was normal in structure. Pulmonic valve regurgitation is not visualized. Aorta: The aortic root and ascending aorta are structurally normal, with no evidence of dilitation. IAS/Shunts: No atrial level shunt detected by color flow Doppler.  LEFT VENTRICLE PLAX 2D LVIDd:         4.20 cm   Diastology LVIDs:         2.90 cm   LV e' medial:    4.90 cm/s LV PW:         1.00 cm   LV E/e' medial:  19.9 LV IVS:        1.20 cm   LV e' lateral:   7.94 cm/s LVOT diam:     1.80 cm   LV E/e' lateral: 12.3 LV SV:         41 LV SV Index:   24 LVOT Area:     2.54 cm  RIGHT VENTRICLE             IVC RV Basal diam:  2.60 cm     IVC diam: 1.40 cm RV S prime:     10.30 cm/s TAPSE (M-mode): 2.6 cm LEFT ATRIUM              Index        RIGHT ATRIUM           Index LA diam:        4.00 cm  2.36 cm/m   RA Area:     12.40 cm LA Vol (A2C):   52.0 ml  30.71 ml/m  RA Volume:   24.70 ml  14.59 ml/m LA Vol (A4C):   110.0 ml 64.97 ml/m LA Biplane Vol: 75.9 ml  44.83 ml/m  AORTIC VALVE LVOT Vmax:   89.00 cm/s LVOT Vmean:  54.800 cm/s LVOT VTI:    0.163 m  AORTA Ao Root diam: 3.30 cm Ao Asc diam:  3.30 cm MITRAL VALVE               TRICUSPID VALVE MV Area (PHT): 3.31 cm    TR Peak grad:   23.8 mmHg MV Decel Time: 229 msec    TR Vmax:        244.00 cm/s MV E velocity: 97.70 cm/s MV A velocity: 87.00 cm/s  SHUNTS MV E/A ratio:  1.12         Systemic VTI:  0.16 m                            Systemic Diam: 1.80 cm Dorris Carnes MD Electronically signed by Dorris Carnes MD Signature Date/Time: 07/09/2022/6:03:45 PM    Final    DG Chest Portable 1 View  Result Date: 07/09/2022 CLINICAL DATA:  Shortness of breath. EXAM: PORTABLE CHEST 1 VIEW COMPARISON:  02/03/2022. FINDINGS: The heart is enlarged and the mediastinal contour is stable. There is a large hiatal hernia. Pulmonary vascular congestion is present. Atherosclerotic calcification of the aorta is noted. Patchy perihilar airspace disease is noted bilaterally. No effusion or pneumothorax. No acute osseous abnormality. IMPRESSION: 1. Cardiomegaly with pulmonary vascular congestion. 2. Patchy perihilar airspace disease bilaterally suggesting edema. Correlate clinically to exclude infection. 3. Large hiatal hernia. Electronically Signed   By: Brett Fairy M.D.   On: 07/09/2022 02:16    Labs:  CBC: Recent Labs    07/11/22 0050 07/20/22 1022 07/24/22 1325 07/25/22 0440  WBC 9.2 7.0 23.2* 18.5*  HGB 12.9 14.4 16.2* 14.3  HCT 37.7 42.9 47.8* 43.2  PLT 279 429* 402* 331    COAGS: Recent Labs    07/25/22 0440  INR 1.8*    BMP: Recent Labs    07/11/22 0050 07/20/22 1022 07/24/22 1325 07/25/22 0440  NA 131* 131* 132* 127*  K 4.0 4.6 3.9 4.1  CL 96* 93* 91* 90*  CO2 25 29 28 27   GLUCOSE 149* 74 129* 143*  BUN 30* 38* 34* 39*  CALCIUM 8.9 9.3 9.4 8.5*  CREATININE 1.09* 1.23* 1.20* 1.58*  GFRNONAA 49* 42* 43* 31*    LIVER FUNCTION TESTS: Recent Labs    07/09/22 0156 07/09/22 0517 07/24/22 1325 07/25/22 0440  BILITOT 1.3* 0.6 1.1 0.6  AST 44* 34 43* 51*  ALT 22 22 47* 59*  ALKPHOS 37* 33* 38 37*  PROT 6.0* 6.0* 7.2 6.1*  ALBUMIN 3.1* 3.2* 3.7 2.8*    TUMOR MARKERS: No results for input(s): "AFPTM", "CEA", "CA199", "CHROMGRNA" in the last 8760 hours.  Assessment and Plan: 89 y.o. female with PMH CHF, GERD, HTN, hypothyroidism, recent diagnosis of PAF on eliquis  (LD 3/18 pm), prior hysterectomy who presented to the [facility] ED on 07/24/22 with right-sided abdominal pain. Patient states that she had acute onset right-sided pain after eating 3/18.  Pain is persistent throughout the day. She was worried that she was constipated so she took some MiraLAX. Due to persistence of her pain she presented to the ER. On arrival to the emergency department she was afebrile ,hemodynamically stable. Labs were obtained on presentation which revealed WBC 23.2, hemoglobin 16.2, platelets 402, sodium 132, potassium 3.9, creatinine 1.2, AST 43, ALT 47, bilirubin 1.1, lipase 22, lactic acid 1.5. Patient underwent chest x-ray which showed large hiatal hernia. CT abdomen pelvis on 3/19 revealed:   1. Acute cholecystitis with cholelithiasis. Recommend surgical consultation. 2. Proximal small bowel distention suggesting ileus. 3. Large hiatal hernia. 4. L1 compression deformity, new since 06/17/2021. 5. Sigmoid diverticulosis. 6. Scattered ground-glass opacities in the visualized right lower lung, possibly infectious/inflammatory. Non-contrast chest CT at 3-6 months is recommended. If nodules persist, subsequent management will be based upon the most suspicious nodule(s). 7. Aortic Atherosclerosis    Pt currently afebrile, sl tachycardic at 102, WBC 18.5(23.2), hgb nl, plts nl, creat 1.58, t bili nl, other LFT's sl elevated; PT 21 INR 1.8. On IV Flagyl/Rocephin. Request now received from [facility] for perc cholecystostomy. Imaging studies have been reviewed by Dr. Vernard Gambles. Risks and benefits discussed with the patient /family including bleeding, infection, damage to adjacent structures, need for prolonged drainage and sepsis.  All of the patient's questions were answered, patient is agreeable to proceed. Consent signed and in chart.  Patient had Ensure a few moments ago therefore procedure will be done on 3/21. [facility] updated. Hold eliquis until after procedure.     Thank you for this  interesting consult.  I greatly enjoyed meeting Ellen Chandler and look forward to participating in their care.  A copy of this report was sent to the requesting provider on this date.  Electronically Signed: D. Rowe Robert, PA-C 07/25/2022, 10:42 AM   I spent a total of 25 minutes  in face to face in clinical consultation, greater than 50% of which was counseling/coordinating care for percutaneous cholecystostomy

## 2022-07-26 ENCOUNTER — Inpatient Hospital Stay (HOSPITAL_COMMUNITY): Payer: PPO

## 2022-07-26 DIAGNOSIS — N179 Acute kidney failure, unspecified: Secondary | ICD-10-CM

## 2022-07-26 DIAGNOSIS — E039 Hypothyroidism, unspecified: Secondary | ICD-10-CM

## 2022-07-26 DIAGNOSIS — E871 Hypo-osmolality and hyponatremia: Secondary | ICD-10-CM | POA: Diagnosis not present

## 2022-07-26 DIAGNOSIS — K810 Acute cholecystitis: Secondary | ICD-10-CM | POA: Diagnosis not present

## 2022-07-26 DIAGNOSIS — I10 Essential (primary) hypertension: Secondary | ICD-10-CM

## 2022-07-26 DIAGNOSIS — I5032 Chronic diastolic (congestive) heart failure: Secondary | ICD-10-CM

## 2022-07-26 LAB — CBC WITH DIFFERENTIAL/PLATELET
Abs Immature Granulocytes: 0.89 10*3/uL — ABNORMAL HIGH (ref 0.00–0.07)
Basophils Absolute: 0 10*3/uL (ref 0.0–0.1)
Basophils Relative: 0 %
Eosinophils Absolute: 0 10*3/uL (ref 0.0–0.5)
Eosinophils Relative: 0 %
HCT: 36.9 % (ref 36.0–46.0)
Hemoglobin: 12.2 g/dL (ref 12.0–15.0)
Immature Granulocytes: 6 %
Lymphocytes Relative: 5 %
Lymphs Abs: 0.8 10*3/uL (ref 0.7–4.0)
MCH: 31.8 pg (ref 26.0–34.0)
MCHC: 33.1 g/dL (ref 30.0–36.0)
MCV: 96.1 fL (ref 80.0–100.0)
Monocytes Absolute: 0.7 10*3/uL (ref 0.1–1.0)
Monocytes Relative: 5 %
Neutro Abs: 11.8 10*3/uL — ABNORMAL HIGH (ref 1.7–7.7)
Neutrophils Relative %: 84 %
Platelets: 268 10*3/uL (ref 150–400)
RBC: 3.84 MIL/uL — ABNORMAL LOW (ref 3.87–5.11)
RDW: 14.1 % (ref 11.5–15.5)
WBC: 14.2 10*3/uL — ABNORMAL HIGH (ref 4.0–10.5)
nRBC: 0 % (ref 0.0–0.2)

## 2022-07-26 LAB — COMPREHENSIVE METABOLIC PANEL
ALT: 43 U/L (ref 0–44)
AST: 26 U/L (ref 15–41)
Albumin: 2.4 g/dL — ABNORMAL LOW (ref 3.5–5.0)
Alkaline Phosphatase: 40 U/L (ref 38–126)
Anion gap: 7 (ref 5–15)
BUN: 33 mg/dL — ABNORMAL HIGH (ref 8–23)
CO2: 26 mmol/L (ref 22–32)
Calcium: 8.1 mg/dL — ABNORMAL LOW (ref 8.9–10.3)
Chloride: 95 mmol/L — ABNORMAL LOW (ref 98–111)
Creatinine, Ser: 1.27 mg/dL — ABNORMAL HIGH (ref 0.44–1.00)
GFR, Estimated: 40 mL/min — ABNORMAL LOW (ref >60)
Glucose, Bld: 117 mg/dL — ABNORMAL HIGH (ref 70–99)
Potassium: 4.4 mmol/L (ref 3.5–5.1)
Sodium: 128 mmol/L — ABNORMAL LOW (ref 135–145)
Total Bilirubin: 0.5 mg/dL (ref 0.3–1.2)
Total Protein: 5.3 g/dL — ABNORMAL LOW (ref 6.5–8.1)

## 2022-07-26 LAB — PROTIME-INR
INR: 1.5 — ABNORMAL HIGH (ref 0.8–1.2)
Prothrombin Time: 17.6 seconds — ABNORMAL HIGH (ref 11.4–15.2)

## 2022-07-26 MED ORDER — FLUMAZENIL 0.5 MG/5ML IV SOLN
INTRAVENOUS | Status: AC
  Filled 2022-07-26: qty 5

## 2022-07-26 MED ORDER — FENTANYL CITRATE (PF) 100 MCG/2ML IJ SOLN
INTRAMUSCULAR | Status: AC | PRN
  Administered 2022-07-26: 50 ug via INTRAVENOUS

## 2022-07-26 MED ORDER — ALBUTEROL SULFATE (2.5 MG/3ML) 0.083% IN NEBU: 2.5000 mg | INHALATION_SOLUTION | Freq: Four times a day (QID) | RESPIRATORY_TRACT | Status: DC | PRN

## 2022-07-26 MED ORDER — MIDAZOLAM HCL 2 MG/2ML IJ SOLN
INTRAMUSCULAR | Status: AC | PRN
  Administered 2022-07-26: 1 mg via INTRAVENOUS

## 2022-07-26 MED ORDER — FENTANYL CITRATE (PF) 100 MCG/2ML IJ SOLN
INTRAMUSCULAR | Status: AC
  Filled 2022-07-26: qty 4

## 2022-07-26 MED ORDER — BISACODYL 10 MG RE SUPP
10.0000 mg | Freq: Every day | RECTAL | Status: AC | PRN
  Administered 2022-07-26: 10 mg via RECTAL
  Filled 2022-07-26: qty 1

## 2022-07-26 MED ORDER — CHLORHEXIDINE GLUCONATE CLOTH 2 % EX PADS
6.0000 | MEDICATED_PAD | Freq: Every day | CUTANEOUS | Status: AC
  Administered 2022-07-26: 6 via TOPICAL

## 2022-07-26 MED ORDER — MIDAZOLAM HCL 2 MG/2ML IJ SOLN
INTRAMUSCULAR | Status: AC
  Filled 2022-07-26: qty 4

## 2022-07-26 MED ORDER — LEVALBUTEROL HCL 0.63 MG/3ML IN NEBU
0.6300 mg | INHALATION_SOLUTION | Freq: Four times a day (QID) | RESPIRATORY_TRACT | Status: AC | PRN
  Administered 2022-07-26 – 2022-07-28 (×3): 0.63 mg via RESPIRATORY_TRACT
  Filled 2022-07-26 (×5): qty 3

## 2022-07-26 MED ORDER — MUPIROCIN 2 % EX OINT
1.0000 | TOPICAL_OINTMENT | Freq: Two times a day (BID) | CUTANEOUS | Status: AC
  Administered 2022-07-26 – 2022-07-30 (×10): 1 via NASAL
  Filled 2022-07-26 (×4): qty 22

## 2022-07-26 MED ORDER — NALOXONE HCL 0.4 MG/ML IJ SOLN
INTRAMUSCULAR | Status: AC
  Filled 2022-07-26: qty 1

## 2022-07-26 MED ORDER — LIDOCAINE HCL (PF) 1 % IJ SOLN
INTRAMUSCULAR | Status: AC | PRN
  Administered 2022-07-26: 10 mL

## 2022-07-26 NOTE — Progress Notes (Signed)
PT Cancellation Note  Patient Details Name: Ellen Chandler MRN: [id number] DOB: [birth year]   Cancelled Treatment:    Reason Eval/Treat Not Completed: Patient at procedure or test/unavailable (pt is having drain placed, will attempt later this afternoon.)  Philomena Doheny PT 07/26/2022  [place]  Office [phone]

## 2022-07-26 NOTE — Evaluation (Signed)
Physical Therapy Evaluation Patient Details Name: Ellen Chandler MRN: [id number] DOB: May 13, 1930 Today's Date: 07/26/2022  History of Present Illness  89 y.o. female with medical history significant of GERD, hypertension, hyperlipidemia, hypothyroidism who presented to the emergency department with right-sided abdominal pain. Dx of acute cholecystitis. Recent admission for respiratory failure 3/4-07/11/22.  Clinical Impression  Pt admitted with above diagnosis. Pt reports she's very weak. At baseline she ambulates with a rollator. Max assist for supine to sit. Pt had poor sitting balance with posterior lean. She reported pain and fatigue with sitting at edge of bed and declined attempt to stand, she requested to lie back down. Benefits of mobility and risks of bedrest were explained. Pt reports she'll be able to do more after she eats some food to get some strength.  Pt currently with functional limitations due to the deficits listed below (see PT Problem List). Pt will benefit from skilled PT to increase their independence and safety with mobility to allow discharge to the venue listed below.          Recommendations for follow up therapy are one component of a multi-disciplinary discharge planning process, led by the attending physician.  Recommendations may be updated based on patient status, additional functional criteria and insurance authorization.  Follow Up Recommendations Skilled nursing-short term rehab (<3 hours/day)      Assistance Recommended at Discharge Frequent or constant Supervision/Assistance  Patient can return home with the following  A lot of help with walking and/or transfers;A lot of help with bathing/dressing/bathroom;Assistance with cooking/housework;Assist for transportation;Help with stairs or ramp for entrance    Equipment Recommendations None recommended by PT  Recommendations for Other Services       Functional Status Assessment Patient has had a recent  decline in their functional status and demonstrates the ability to make significant improvements in function in a reasonable and predictable amount of time.     Precautions / Restrictions Precautions Precautions: Fall Precaution Comments: denies falls in past 6 months Restrictions Weight Bearing Restrictions: No      Mobility  Bed Mobility Overal bed mobility: Needs Assistance       Supine to sit: Max assist Sit to supine: Max assist   General bed mobility comments: assist to raise trunk and pivot hips to edge of bed, assist to control descent and bring BLEs into bed; poor sitting balance, pt c/o pain and fatigue and declined to attempt standing    Transfers                   General transfer comment: pt refused    Ambulation/Gait               General Gait Details: pt refused  Stairs            Wheelchair Mobility    Modified Rankin (Stroke Patients Only)       Balance Overall balance assessment: Needs assistance Sitting-balance support: Feet supported, No upper extremity supported Sitting balance-Leahy Scale: Poor Sitting balance - Comments: posterior lean requiring min A Postural control: Posterior lean Standing balance support: Reliant on assistive device for balance Standing balance-Leahy Scale: Fair                               Pertinent Vitals/Pain Pain Assessment Pain Assessment: 0-10 Pain Score: 6  Pain Location: drain site with movement Pain Descriptors / Indicators: Moaning, Grimacing, Guarding Pain Intervention(s): Limited activity within patient's  tolerance, Monitored during session, Patient requesting pain meds-RN notified    Home Living Family/patient expects to be discharged to:: Private residence Living Arrangements: Alone Available Help at Discharge: Family;Available PRN/intermittently Type of Home: Independent living facility Home Access: Level entry       Home Layout: One level Home Equipment:  Conservation officer, nature (2 wheels);Rollator (4 wheels);BSC/3in1;Grab bars - tub/shower;Grab bars - toilet      Prior Function Prior Level of Function : Independent/Modified Independent             Mobility Comments: Uses rollator for ambulation ADLs Comments: assist for cleaning 1x/month, family gets groceries and takes her to appointments, they check on her daily     Hand Dominance   Dominant Hand: Right    Extremity/Trunk Assessment   Upper Extremity Assessment Upper Extremity Assessment: Defer to OT evaluation    Lower Extremity Assessment Lower Extremity Assessment: Generalized weakness;RLE deficits/detail;LLE deficits/detail RLE Deficits / Details: knee ext -4/5 RLE Sensation: WNL LLE Deficits / Details: knee ext AROM -15* (pt reports this is 2* prior TKA), knee ext -4/5 LLE Sensation: WNL    Cervical / Trunk Assessment Cervical / Trunk Assessment: Kyphotic  Communication   Communication: No difficulties  Cognition Arousal/Alertness: Awake/alert Behavior During Therapy: WFL for tasks assessed/performed Overall Cognitive Status: Within Functional Limits for tasks assessed                                          General Comments      Exercises     Assessment/Plan    PT Assessment Patient needs continued PT services  PT Problem List Decreased strength;Decreased activity tolerance;Decreased mobility;Decreased coordination;Decreased knowledge of use of DME;Cardiopulmonary status limiting activity       PT Treatment Interventions Gait training;Stair training;DME instruction;Functional mobility training;Therapeutic activities;Therapeutic exercise;Neuromuscular re-education;Patient/family education    PT Goals (Current goals can be found in the Care Plan section)  Acute Rehab PT Goals Patient Stated Goal: "to get stronger" PT Goal Formulation: With patient Time For Goal Achievement: 08/09/22 Potential to Achieve Goals: Good    Frequency Min  3X/week     Co-evaluation               AM-PAC PT "6 Clicks" Mobility  Outcome Measure Help needed turning from your back to your side while in a flat bed without using bedrails?: A Lot Help needed moving from lying on your back to sitting on the side of a flat bed without using bedrails?: A Lot Help needed moving to and from a bed to a chair (including a wheelchair)?: Total Help needed standing up from a chair using your arms (e.g., wheelchair or bedside chair)?: Total Help needed to walk in hospital room?: Total Help needed climbing 3-5 steps with a railing? : Total 6 Click Score: 8    End of Session Equipment Utilized During Treatment: Gait belt;Oxygen Activity Tolerance: Patient limited by fatigue Patient left: in bed;with call bell/phone within reach;with bed alarm set Nurse Communication: Mobility status;Patient requests pain meds PT Visit Diagnosis: Other abnormalities of gait and mobility (R26.89);Difficulty in walking, not elsewhere classified (R26.2);Pain    Time: 1351-1402 PT Time Calculation (min) (ACUTE ONLY): 11 min   Charges:   PT Evaluation $PT Eval Moderate Complexity: 1 Mod          Philomena Doheny PT 07/26/2022  Acute Rehabilitation Services  Office [phone]

## 2022-07-26 NOTE — Progress Notes (Signed)
  Progress Note   Patient: Ellen Chandler [id number] DOB: 08-01-1930 DOA: 07/24/2022     1 DOS: the patient was seen and examined on 07/26/2022 at 8:24 AM      Brief hospital course: Mrs. Waechter is an 89 y.o. F with dCHF, recent admit for CHF, HTN, hypothyroidism and pAF on Eliquis who presented with acute right upper quadrant abdominal pain.  In the ER, WBC 23K, LFTs slightly up, CT showed acute cholecystitis with cholelithiasis.     3/19: Admitted, General Surgery consutled, recommended surgery after Eliquis washout 3/20: IR consulted 3/21: Plan for percutaneous drain today      Assessment and Plan: * Acute cholecystitis - Continue Rocephin and Flagyl - Consult IR for perc tube, plan for placement today - Appreciate Surgical expertise -N.p.o. and IV fluids    AKI (acute kidney injury) ([place]) Baseline Cr 0.7-0.9 last fall.  Up to 1.1-1.2 this March, but >1.5 on admission.   Improved overnight with fluids - Continue IV fluids - Avoid nephrotoxins and hypotension   Hyponatremia Sodium unchanged, likely hypovolemic Maybe some component inappropriate water handling, but will need fluids given no PO intake - Continue IV fluids - Trend Na  Paroxysmal A-fib (HCC) HR controlled - Hold Eliquis and metoprolol - Continue amiodarone   Hypothyroidism - Continue levothyroxine  Chronic diastolic CHF (congestive heart failure) (HCC) Appears dehydrated - Hold s spironolactone and furosemide  Essential hypertension BP soft - Hold metoprolol, furosemide, spironolactone          Subjective: Patient is extremely weak and tired, she has had no fever overnight, no significant confusion, no respiratory distress, no nursing concerns.  IR plan for PERC drain today     Physical Exam: BP 106/77 (BP Location: Right Arm)   Pulse 74   Temp (!) 97.5 F (36.4 C) (Oral)   Resp 17   Ht 5\' 5"  (1.651 m)   Wt 63.5 kg   SpO2 95%   BMI 23.30 kg/m   Elderly adult  female, appears weak and tired, makes eye contact and responds to questions, but "extremely weak" RRR, no murmurs, no peripheral edema Respiratory rate normal, lungs clear without rales or wheezes Asked me not examined her belly because it hurts so much, no appearance of rigidity Attentive to questions, makes eye contact, responds appropriately, follows commands, generalized weakness bilaterally, face symmetric, speech fluent     Data Reviewed: INR normal Sodium unchanged at 128 White blood cell count slightly down to 14K Hemoglobin normal LFTs improved Creatinine down to 1.2      Disposition: Status is: Inpatient The patient will require percutaneous drain for cholecystitis, continue IV antibiotics, afterwards advance diet, work with physical therapy, likely SNF in 2-3 days        Author: Edwin Dada, MD 07/26/2022 10:20 AM  For on call review [email or web address].

## 2022-07-26 NOTE — TOC CM/SW Note (Addendum)
  Transition of Care [facility]) Screening Note   Patient Details  Name: Ellen Chandler Date of Birth: 1931-03-27   [facility]) CM/SW Contact:    Lennart Pall, LCSW Phone Number: 07/26/2022, 10:10 AM    Transition of Care Department [facility]) has reviewed patient and no TOC needs have been identified at this time. We will continue to monitor patient advancement through interdisciplinary progression rounds. If new patient transition needs arise, please place a TOC consult.  Markis Langland, LCSW

## 2022-07-26 NOTE — Progress Notes (Addendum)
Audible wheezing noted. Patient c/o back of neck pain. Was reported that the patient took a drink of water and started coughing. Paged NP Owens Shark. Tylenol given. Tolerated well. Also increased her 0xygen from 1L to 3 L due to shortness of breath. HOB elevated at 45 degrees at this time. Expratory wheezes noted. Awaiting orders at this time. \   2334- Patient attempted to use bedpan. Afterwards patient expiratory wheezing noted again. 02 sats 91-93% on 3L 02. Repositioned patient up in bed to accommodate her breathing. Patient says that felt better. Patient is requesting to see the on call NP. Paged NP Jola Baptist.   [phone]- This nurse and tech repositioned patient up in the bed. Explained to patient that her chest x ray did not reveal fluid build up but showed lung atelectasis. Working with patient on incentive spirometer. After patient was moved up in bed, her breathing gradually got better.   2350- No audible wheezing noted. Patient has calmed down. Continue to work on breathing exercises with patient 02 sat 92-93%.   Amulius.Bland- NP Zebedee Iba here at patient bedside.  0029- Provided ice chips to patient. Awaiting orders. B/p 114/69. 02 sats 95% on 3L, will decrease to 2L- 02 sat 96%  0451- Patient has greatly improved during the night since after lasix given IV. Have attempted to have bowel movement without success. Tolerates ice chips well. Remains Npo otherwise until SLP comes to evaluate and treat her. Will hold po meds for now.

## 2022-07-26 NOTE — Procedures (Signed)
Interventional Radiology Procedure:   Indications: Acute cholecystitis  Procedure: CT guided cholecystostomy tube placement  Findings: 10 Fr drain placed directly into the gallbladder and 130 ml of red/brown fluid removed.   Complications: None     EBL: Minimal  Plan: Send fluid for culture.   Ellen Chandler R. Anselm Pancoast, MD  Pager: [phone]

## 2022-07-26 NOTE — Progress Notes (Signed)
Progress Note     Subjective: Pt feeling weak this AM. Was given ensure yesterday, so IR drain placement moved to today. Some right sided abdominal pain still present. Passing flatus, denies n/v.   Objective: Vital signs in last 24 hours: Temp:  [97.5 F (36.4 C)-99.1 F (37.3 C)] 97.5 F (36.4 C) (03/21 0617) Pulse Rate:  [74-102] 74 (03/21 0617) Resp:  [17-19] 17 (03/21 0617) BP: (106-167)/(63-85) 106/77 (03/21 0617) SpO2:  [91 %-95 %] 95 % (03/21 0617) Last BM Date : 07/24/22  Intake/Output from previous day: 03/20 0701 - 03/21 0700 In: 2226.5 [P.O.:330; I.V.:1696.5; IV Piggyback:200] Out: 500 [Urine:500] Intake/Output this shift: Total I/O In: 0  Out: 150 [Urine:150]  PE: General: pleasant, WD, elderly female who is laying in bed and appears tired HEENT: sclera anicteric  Heart: regular, rate, and rhythm.   Lungs:   Respiratory effort nonlabored Abd: soft, ttp along right abdomen and epigastrium, ND, +BS   Lab Results:  Recent Labs    07/25/22 0440 07/26/22 0444  WBC 18.5* 14.2*  HGB 14.3 12.2  HCT 43.2 36.9  PLT 331 268    BMET Recent Labs    07/25/22 0440 07/26/22 0444  NA 127* 128*  K 4.1 4.4  CL 90* 95*  CO2 27 26  GLUCOSE 143* 117*  BUN 39* 33*  CREATININE 1.58* 1.27*  CALCIUM 8.5* 8.1*    PT/INR Recent Labs    07/25/22 0440 07/26/22 0444  LABPROT 21.0* 17.6*  INR 1.8* 1.5*    CMP     Component Value Date/Time   NA 128 (L) 07/26/2022 0444   K 4.4 07/26/2022 0444   CL 95 (L) 07/26/2022 0444   CO2 26 07/26/2022 0444   GLUCOSE 117 (H) 07/26/2022 0444   BUN 33 (H) 07/26/2022 0444   CREATININE 1.27 (H) 07/26/2022 0444   CALCIUM 8.1 (L) 07/26/2022 0444   PROT 5.3 (L) 07/26/2022 0444   ALBUMIN 2.4 (L) 07/26/2022 0444   AST 26 07/26/2022 0444   ALT 43 07/26/2022 0444   ALKPHOS 40 07/26/2022 0444   BILITOT 0.5 07/26/2022 0444   GFRNONAA 40 (L) 07/26/2022 0444   GFRAA >60 09/20/2017 0602   Lipase     Component Value  Date/Time   LIPASE 22 07/24/2022 1325       Studies/Results: CT ABDOMEN PELVIS W CONTRAST  Result Date: 07/24/2022 CLINICAL DATA:  Right lower quadrant pain EXAM: CT ABDOMEN AND PELVIS WITH CONTRAST TECHNIQUE: Multidetector CT imaging of the abdomen and pelvis was performed using the standard protocol following bolus administration of intravenous contrast. RADIATION DOSE REDUCTION: This exam was performed according to the departmental dose-optimization program which includes automated exposure control, adjustment of the mA and/or kV according to patient size and/or use of iterative reconstruction technique. CONTRAST:  46mL OMNIPAQUE IOHEXOL 300 MG/ML  SOLN COMPARISON:  09/20/2017 FINDINGS: Lower chest: Moderate hiatal hernia involving the fundus and cardia, incompletely visualized, with some adjacent compressive atelectasis medially in both lung bases. No pleural or pericardial effusion. Heart size normal. Scattered ground-glass opacities in the visualized right lower lung. No pleural or pericardial effusion. Hepatobiliary: Marked dilatation of the gallbladder containing stones measured up to 3.1 cm diameter. There is wall thickening and mucosal enhancement with marked surrounding edema and inflammation. There is no intrahepatic biliary ductal dilatation. No focal liver lesion. Pancreas: Unremarkable. No pancreatic ductal dilatation or surrounding inflammatory changes. Spleen: Normal in size without focal abnormality. Adrenals/Urinary Tract: Adrenal glands are unremarkable. Kidneys are normal, without renal  calculi, focal lesion, or hydronephrosis. Bladder is physiologically distended. Stomach/Bowel: Large hiatal hernia involving gastric fundus and cardia. Dilated duodenal bulb, with focal narrowing in the proximal duodenum related to the distended inflamed gallbladder. There is gas and fluid distention of proximal small bowel loops. Distal small bowel decompressed, without discrete transition point. There  is gaseous distention of the cecum. There is tapered narrowing of the ascending colon related to mass effect from the gallbladder inflammation and distension. The more distal colon is nondilated with a few scattered sigmoid diverticula; no adjacent inflammatory change. Vascular/Lymphatic: Scattered aortoiliac calcified plaque without aneurysm. Portal vein patent. No abdominal or pelvic adenopathy. Reproductive: Status post hysterectomy. No adnexal masses. Other: No ascites.  No free air. Musculoskeletal: Moderate L1 compression deformity with approximately 20% loss of height anteriorly, and diffuse sclerosis, new since most recent radiograph 06/17/2021. Chronic L2 compression deformity stable. Grade 1 anterolisthesis L5-S1 as before. IMPRESSION: 1. Acute cholecystitis with cholelithiasis. Recommend surgical consultation. 2. Proximal small bowel distention suggesting ileus. 3. Large hiatal hernia. 4. L1 compression deformity, new since 06/17/2021. 5. Sigmoid diverticulosis. 6. Scattered ground-glass opacities in the visualized right lower lung, possibly infectious/inflammatory. Non-contrast chest CT at 3-6 months is recommended. If nodules persist, subsequent management will be based upon the most suspicious nodule(s). This recommendation follows the consensus statement: Guidelines for Management of Incidental Pulmonary Nodules Detected on CT Images:From the Fleischner Society 2017; published online before print (10.1148/radiol.IJ:2314499). 7.  Aortic Atherosclerosis (ICD10-I70.0). Electronically Signed   By: Lucrezia Europe M.D.   On: 07/24/2022 16:07   DG Chest 2 View  Result Date: 07/24/2022 CLINICAL DATA:  Hypoxia, abdominal pain EXAM: CHEST - 2 VIEW COMPARISON:  07/09/2022 FINDINGS: Transverse diameter of heart is increased. There is large fixed hiatal hernia. There is significant interval improvement in aeration in both lungs. Small residual patchy infiltrates are seen in right parahilar region. There are no  signs of alveolar pulmonary edema. There is no significant pleural effusion or pneumothorax. IMPRESSION: There is interval clearing of pulmonary vascular congestion. Small residual patchy densities are seen in right parahilar region suggesting residual scarring or subsegmental atelectasis. There is no focal pulmonary consolidation. Large fixed hiatal hernia. Electronically Signed   By: Elmer Picker M.D.   On: 07/24/2022 14:55    Anti-infectives: Anti-infectives (From admission, onward)    Start     Dose/Rate Route Frequency Ordered Stop   07/25/22 1800  cefTRIAXone (ROCEPHIN) 2 g in sodium chloride 0.9 % 100 mL IVPB        2 g 200 mL/hr over 30 Minutes Intravenous Every 24 hours 07/25/22 1140     07/25/22 1700  metroNIDAZOLE (FLAGYL) IVPB 500 mg        500 mg 100 mL/hr over 60 Minutes Intravenous Every 12 hours 07/25/22 1140     07/24/22 2000  piperacillin-tazobactam (ZOSYN) IVPB 3.375 g  Status:  Discontinued        3.375 g 12.5 mL/hr over 240 Minutes Intravenous Every 8 hours 07/24/22 1856 07/25/22 1140   07/24/22 1815  cefTRIAXone (ROCEPHIN) 2 g in sodium chloride 0.9 % 100 mL IVPB        2 g 200 mL/hr over 30 Minutes Intravenous  Once 07/24/22 1800 07/24/22 1859        Assessment/Plan  Acute cholecystitis  - CT yesterday with significant gallbladder wall thickening and surrounding inflammation and edema, dilated duodenal bulb likely secondary to inflamed gallbladder as well - Tbili WNL and LFTs otherwise normalized - WBC 14 from 23  on admission, afebrile - given degree of inflammation and being on blood thinners at baseline, would recommend IR percutaneous cholecystostomy at this time - can determine candidacy for interval lap chole at future follow up appointment, but no plans for immediate surgical intervention - we will follow    FEN: NPO, IVF per [facility] VTE: Eliquis on hold ID: Zosyn 3/19>>  - below per [facility] -  HTN PAF on Eliquis Hypothyroidism Combined chronic  CHF Large hiatal hernia  Hyponatremia    LOS: 1 day   I reviewed Consultant IR notes, hospitalist notes, last 24 h vitals and pain scores, last 48 h intake and output, and last 24 h labs and trends.    Norm Parcel, [facility] 07/26/2022, 9:14 AM Please see Amion for pager number during day hours 7:00am-4:30pm

## 2022-07-27 DIAGNOSIS — I5032 Chronic diastolic (congestive) heart failure: Secondary | ICD-10-CM | POA: Diagnosis not present

## 2022-07-27 DIAGNOSIS — I480 Paroxysmal atrial fibrillation: Secondary | ICD-10-CM

## 2022-07-27 DIAGNOSIS — E440 Moderate protein-calorie malnutrition: Secondary | ICD-10-CM | POA: Insufficient documentation

## 2022-07-27 DIAGNOSIS — K810 Acute cholecystitis: Secondary | ICD-10-CM | POA: Diagnosis not present

## 2022-07-27 DIAGNOSIS — E871 Hypo-osmolality and hyponatremia: Secondary | ICD-10-CM | POA: Diagnosis not present

## 2022-07-27 DIAGNOSIS — R0902 Hypoxemia: Secondary | ICD-10-CM | POA: Insufficient documentation

## 2022-07-27 DIAGNOSIS — N179 Acute kidney failure, unspecified: Secondary | ICD-10-CM | POA: Diagnosis not present

## 2022-07-27 LAB — BRAIN NATRIURETIC PEPTIDE: B Natriuretic Peptide: 196.9 pg/mL — ABNORMAL HIGH (ref 0.0–100.0)

## 2022-07-27 LAB — COMPREHENSIVE METABOLIC PANEL
ALT: 38 U/L (ref 0–44)
AST: 28 U/L (ref 15–41)
Albumin: 2.5 g/dL — ABNORMAL LOW (ref 3.5–5.0)
Alkaline Phosphatase: 50 U/L (ref 38–126)
Anion gap: 9 (ref 5–15)
BUN: 30 mg/dL — ABNORMAL HIGH (ref 8–23)
CO2: 22 mmol/L (ref 22–32)
Calcium: 7.9 mg/dL — ABNORMAL LOW (ref 8.9–10.3)
Chloride: 96 mmol/L — ABNORMAL LOW (ref 98–111)
Creatinine, Ser: 1.14 mg/dL — ABNORMAL HIGH (ref 0.44–1.00)
GFR, Estimated: 46 mL/min — ABNORMAL LOW (ref >60)
Glucose, Bld: 127 mg/dL — ABNORMAL HIGH (ref 70–99)
Potassium: 4.4 mmol/L (ref 3.5–5.1)
Sodium: 127 mmol/L — ABNORMAL LOW (ref 135–145)
Total Bilirubin: 0.4 mg/dL (ref 0.3–1.2)
Total Protein: 5.8 g/dL — ABNORMAL LOW (ref 6.5–8.1)

## 2022-07-27 LAB — CBC
HCT: 38.8 % (ref 36.0–46.0)
Hemoglobin: 12.4 g/dL (ref 12.0–15.0)
MCH: 31.6 pg (ref 26.0–34.0)
MCHC: 32 g/dL (ref 30.0–36.0)
MCV: 99 fL (ref 80.0–100.0)
Platelets: 272 10*3/uL (ref 150–400)
RBC: 3.92 MIL/uL (ref 3.87–5.11)
RDW: 14.3 % (ref 11.5–15.5)
WBC: 9.5 10*3/uL (ref 4.0–10.5)
nRBC: 0 % (ref 0.0–0.2)

## 2022-07-27 MED ORDER — SPIRONOLACTONE 12.5 MG HALF TABLET
12.5000 mg | ORAL_TABLET | Freq: Every day | ORAL | Status: DC
  Administered 2022-07-27 – 2022-07-31 (×5): 12.5 mg via ORAL
  Filled 2022-07-27 (×5): qty 1

## 2022-07-27 MED ORDER — FUROSEMIDE 40 MG PO TABS
40.0000 mg | ORAL_TABLET | Freq: Every day | ORAL | Status: DC
  Administered 2022-07-27 – 2022-07-29 (×3): 40 mg via ORAL
  Filled 2022-07-27 (×3): qty 1

## 2022-07-27 MED ORDER — APIXABAN 5 MG PO TABS
5.0000 mg | ORAL_TABLET | Freq: Two times a day (BID) | ORAL | Status: DC
  Administered 2022-07-27 – 2022-07-31 (×9): 5 mg via ORAL
  Filled 2022-07-27 (×9): qty 1

## 2022-07-27 MED ORDER — ADULT MULTIVITAMIN W/MINERALS CH
1.0000 | ORAL_TABLET | Freq: Every day | ORAL | Status: DC
  Administered 2022-07-27 – 2022-07-31 (×5): 1 via ORAL
  Filled 2022-07-27 (×5): qty 1

## 2022-07-27 MED ORDER — CIPROFLOXACIN IN D5W 400 MG/200ML IV SOLN
400.0000 mg | Freq: Two times a day (BID) | INTRAVENOUS | Status: DC
  Administered 2022-07-27 – 2022-07-29 (×5): 400 mg via INTRAVENOUS
  Filled 2022-07-27 (×5): qty 200

## 2022-07-27 MED ORDER — ENSURE ENLIVE PO LIQD
237.0000 mL | Freq: Two times a day (BID) | ORAL | Status: DC
  Administered 2022-07-27 – 2022-07-31 (×8): 237 mL via ORAL

## 2022-07-27 MED ORDER — FUROSEMIDE 10 MG/ML IJ SOLN
20.0000 mg | Freq: Once | INTRAMUSCULAR | Status: AC
  Administered 2022-07-27: 20 mg via INTRAVENOUS
  Filled 2022-07-27: qty 2

## 2022-07-27 NOTE — Progress Notes (Signed)
Progress Note     Subjective: Pt reports trouble breathing overnight, denies pain with inspiration. On supplemental oxygen. Some right sided abdominal pain. No family at bedside this AM.    Objective: Vital signs in last 24 hours: Temp:  [97.6 F (36.4 C)-99.3 F (37.4 C)] 97.6 F (36.4 C) (03/22 0000) Pulse Rate:  [70-79] 74 (03/22 0740) Resp:  [16-24] 18 (03/22 0617) BP: (97-160)/(54-78) 112/70 (03/22 0617) SpO2:  [91 %-98 %] 94 % (03/22 0857) Last BM Date : 07/23/22  Intake/Output from previous day: 03/21 0701 - 03/22 0700 In: 700 [P.O.:600; IV Piggyback:100] Out: 1500 [Urine:900; Drains:600] Intake/Output this shift: Total I/O In: 0  Out: 105 [Urine:100; Drains:5]  PE: General: pleasant, WD, elderly female who is laying in bed and appears tired HEENT: sclera anicteric  Heart: regular, rate, and rhythm.   Lungs:   Respiratory effort nonlabored Abd: soft, ttp along right abdomen and epigastrium, ND, +BS, RUQ drain with bilious fluid    Lab Results:  Recent Labs    07/26/22 0444 07/27/22 0038  WBC 14.2* 9.5  HGB 12.2 12.4  HCT 36.9 38.8  PLT 268 272    BMET Recent Labs    07/26/22 0444 07/27/22 0038  NA 128* 127*  K 4.4 4.4  CL 95* 96*  CO2 26 22  GLUCOSE 117* 127*  BUN 33* 30*  CREATININE 1.27* 1.14*  CALCIUM 8.1* 7.9*    PT/INR Recent Labs    07/25/22 0440 07/26/22 0444  LABPROT 21.0* 17.6*  INR 1.8* 1.5*    CMP     Component Value Date/Time   NA 127 (L) 07/27/2022 0038   K 4.4 07/27/2022 0038   CL 96 (L) 07/27/2022 0038   CO2 22 07/27/2022 0038   GLUCOSE 127 (H) 07/27/2022 0038   BUN 30 (H) 07/27/2022 0038   CREATININE 1.14 (H) 07/27/2022 0038   CALCIUM 7.9 (L) 07/27/2022 0038   PROT 5.8 (L) 07/27/2022 0038   ALBUMIN 2.5 (L) 07/27/2022 0038   AST 28 07/27/2022 0038   ALT 38 07/27/2022 0038   ALKPHOS 50 07/27/2022 0038   BILITOT 0.4 07/27/2022 0038   GFRNONAA 46 (L) 07/27/2022 0038   GFRAA >60 09/20/2017 0602   Lipase      Component Value Date/Time   LIPASE 22 07/24/2022 1325       Studies/Results: DG Chest Port 1 View  Result Date: 07/26/2022 CLINICAL DATA:  Shortness of breath EXAM: PORTABLE CHEST 1 VIEW COMPARISON:  07/24/2022, 06/17/2021, 01/10/2022 FINDINGS: Low lung volumes. Large air distended hiatal hernia. Hazy atelectasis left base. No consolidation, definitive pleural effusion or pneumothorax. Pigtail catheter in the right upper quadrant IMPRESSION: Low lung volumes with hazy atelectasis at the left base. Large air distended hiatal hernia. Electronically Signed   By: Donavan Foil M.D.   On: 07/26/2022 22:07   CT PERC CHOLECYSTOSTOMY  Result Date: 07/26/2022 INDICATION: 89 year old with acute cholecystitis. Patient is not a surgical candidate at this time. EXAM: CT-GUIDED PLACEMENT OF CHOLECYSTOSTOMY TUBE MEDICATIONS: Moderate sedation ANESTHESIA/SEDATION: Moderate (conscious) sedation was employed during this procedure. A total of Versed 1 mg and Fentanyl 50 mcg was administered intravenously by the radiology nurse. Total intra-service moderate Sedation Time: 18 minutes. The patient's level of consciousness and vital signs were monitored continuously by radiology nursing throughout the procedure under my direct supervision. FLUOROSCOPY: None COMPLICATIONS: None immediate. PROCEDURE: Informed written consent was obtained from the patient after a thorough discussion of the procedural risks, benefits and alternatives. All questions were addressed.  Maximal Sterile Barrier Technique was utilized including caps, mask, sterile gowns, sterile gloves, sterile drape, hand hygiene and skin antiseptic. A timeout was performed prior to the initiation of the procedure. Patient was placed supine on CT scanner. Images through the abdomen were obtained. The large abnormal gallbladder was identified with CT. Right side of the abdomen was prepped with chlorhexidine and sterile field was created. Skin was anesthetized using  1% lidocaine. Small incision was made. Using CT guidance, an 18 gauge trocar needle was directed into the gallbladder and brownish red fluid was aspirated. Superstiff Amplatz wire was advanced into the gallbladder and the tract was dilated to accommodate a 10 [place] multipurpose drain. Approximately 130 mL of fluid was removed. Drain was attached to a gravity bag. Fluid sample was sent for culture. Follow up CT images were obtained. Drain was sutured to skin and a dressing was placed. FINDINGS: The gallbladder was markedly distended with surrounding inflammatory change and large hyperdense stones. Drain was placed within the gallbladder without traversing liver parenchyma. Gallbladder was partially decompressed at the end of the procedure. IMPRESSION: CT-guided placement of a cholecystostomy tube. Fluid was sent for culture. Electronically Signed   By: Markus Daft M.D.   On: 07/26/2022 13:59    Anti-infectives: Anti-infectives (From admission, onward)    Start     Dose/Rate Route Frequency Ordered Stop   07/25/22 1800  cefTRIAXone (ROCEPHIN) 2 g in sodium chloride 0.9 % 100 mL IVPB        2 g 200 mL/hr over 30 Minutes Intravenous Every 24 hours 07/25/22 1140     07/25/22 1700  metroNIDAZOLE (FLAGYL) IVPB 500 mg        500 mg 100 mL/hr over 60 Minutes Intravenous Every 12 hours 07/25/22 1140     07/24/22 2000  piperacillin-tazobactam (ZOSYN) IVPB 3.375 g  Status:  Discontinued        3.375 g 12.5 mL/hr over 240 Minutes Intravenous Every 8 hours 07/24/22 1856 07/25/22 1140   07/24/22 1815  cefTRIAXone (ROCEPHIN) 2 g in sodium chloride 0.9 % 100 mL IVPB        2 g 200 mL/hr over 30 Minutes Intravenous  Once 07/24/22 1800 07/24/22 1859        Assessment/Plan  Acute cholecystitis  - CT 3/19 with significant gallbladder wall thickening and surrounding inflammation and edema, dilated duodenal bulb likely secondary to inflamed gallbladder as well - s/p IR perc chole 3/21 - Cx with G- rods  - Tbili  WNL and LFTs otherwise normalized - WBC normalized, afebrile - ok to advance diet as tolerated from surgical standpoint once evaluated by SLP - should complete 10-14 day total course of abx - IR to arrange follow up in drain clinic and I will ensure surgical follow up, although given age and co-morbidities I'm not sure that she is a candidate for interval cholecystectomy   FEN: NPO, IVF per TRH VTE: Eliquis on hold ID: Zosyn 3/19>>  - below per TRH -  HTN PAF on Eliquis Hypothyroidism Combined chronic CHF Large hiatal hernia  Hyponatremia    LOS: 2 days   I reviewed Consultant IR notes, hospitalist notes, last 24 h vitals and pain scores, last 48 h intake and output, and last 24 h labs and trends.    Norm Parcel, [facility] 07/27/2022, 9:02 AM Please see Amion for pager number during day hours 7:00am-4:30pm

## 2022-07-27 NOTE — NC FL2 (Signed)
[place] LEVEL OF CARE FORM     IDENTIFICATION  Patient Name: Ellen Chandler Birthdate: [birth year] Sex: female Admission Date (Current Location): 07/24/2022  [facility] and [place] Number:  Herbalist and Address:  [facility],  [address] [place], [place]      Provider Number: [id number]  Attending Physician Name and Address:  Edwin Dada, *  Relative Name and Phone Number:  Royalti Dewaters, daughter-in-law @ [phone]    Current Level of Care: Hospital Recommended Level of Care: [place] Prior Approval Number:    Date Approved/Denied:   PASRR Number: [id number]  Discharge Plan: SNF    Current Diagnoses: Patient Active Problem List   Diagnosis Date Noted   Malnutrition of moderate degree 07/27/2022   Hypoxia 07/27/2022   Acute cholecystitis 07/24/2022   Acute on chronic diastolic CHF (congestive heart failure) ([place]) 07/09/2022   Acute hypoxemic respiratory failure ([place]) 07/09/2022   Paroxysmal A-fib ([place]) 07/09/2022   AKI (acute kidney injury) ([place]) 02/06/2022   Atrial flutter ([place]) 02/04/2022   Acute on chronic combined systolic and diastolic CHF (congestive heart failure) ([place]) 02/03/2022   GERD (gastroesophageal reflux disease) 02/03/2022   Hypothyroidism 02/03/2022   Acute on chronic combined systolic and diastolic HF (heart failure) ([place]) 01/17/2022   Non-ST elevation (NSTEMI) myocardial infarction ([place]) 01/05/2022   History of ETOH abuse 01/04/2022   Chronic diastolic CHF (congestive heart failure) ([place]) 01/04/2022   UTI (urinary tract infection) 06/17/2016   Nonspecific abnormal electrocardiogram (ECG) (EKG) 05/06/2013   Acute bronchitis 05/06/2013   Essential hypertension [id number]   Acute diastolic CHF (congestive heart failure) ([place]) 05/06/2013   Dehydration 05/05/2013   CAP (community acquired pneumonia) 05/05/2013   Orthostatic hypotension 05/05/2013   Hyponatremia 05/05/2013     Orientation RESPIRATION BLADDER Height & Weight     Self, Time, Situation, Place  O2 Continent, External catheter (currently with purewick) Weight: 140 lb (63.5 kg) Height:  5\' 5"  (165.1 cm)  BEHAVIORAL SYMPTOMS/MOOD NEUROLOGICAL BOWEL NUTRITION STATUS      Continent Diet (regular)  AMBULATORY STATUS COMMUNICATION OF NEEDS Skin   Extensive Assist Verbally Other (Comment) (currently with IR cholecystostomy drain)                       Personal Care Assistance Level of Assistance  Bathing, Feeding, Dressing Bathing Assistance: Limited assistance Feeding assistance: Limited assistance Dressing Assistance: Limited assistance     Functional Limitations Info  Sight, Speech, Hearing Sight Info: Adequate Hearing Info: Adequate Speech Info: Adequate    SPECIAL CARE FACTORS FREQUENCY  PT (By licensed PT), OT (By licensed OT)     PT Frequency: 5x/wk OT Frequency: 5x/wk            Contractures Contractures Info: Not present    Additional Factors Info  Code Status, Allergies Code Status Info: DNR Allergies Info: NKDA           Current Medications (07/27/2022):  This is the current hospital active medication list Current Facility-Administered Medications  Medication Dose Route Frequency Provider Last Rate Last Admin   acetaminophen (TYLENOL) tablet 650 mg  650 mg Oral Q6H PRN Emilee Hero, MD   650 mg at 07/26/22 2009   Or   acetaminophen (TYLENOL) suppository 650 mg  650 mg Rectal Q6H PRN Emilee Hero, MD       amiodarone (PACERONE) tablet 200 mg  200 mg Oral Daily Dorrell, Robert, MD   200 mg at  07/27/22 0916   apixaban (ELIQUIS) tablet 5 mg  5 mg Oral BID Edwin Dada, MD   5 mg at 07/27/22 [id number]   Chlorhexidine Gluconate Cloth 2 % PADS 6 each  6 each Topical Q0600 Edwin Dada, MD   6 each at 07/26/22 0800   ciprofloxacin (CIPRO) IVPB 400 mg  400 mg Intravenous Q12H Danford, Suann Larry, MD       feeding supplement (ENSURE ENLIVE /  ENSURE PLUS) liquid 237 mL  237 mL Oral BID BM Danford, Suann Larry, MD       furosemide (LASIX) tablet 40 mg  40 mg Oral Daily Edwin Dada, MD   40 mg at 07/27/22 0916   levalbuterol (XOPENEX) nebulizer solution 0.63 mg  0.63 mg Nebulization Q6H PRN Raenette Rover, NP   0.63 mg at 07/27/22 [id number]   levothyroxine (SYNTHROID) tablet 88 mcg  88 mcg Oral QAC breakfast Emilee Hero, MD   88 mcg at 07/26/22 [id number]   lip balm (CARMEX) ointment 1 Application  1 Application Topical PRN Edwin Dada, MD       metroNIDAZOLE (FLAGYL) IVPB 500 mg  500 mg Intravenous Q12H Edwin Dada, MD 100 mL/hr at 07/27/22 0400 500 mg at 07/27/22 0400   multivitamin with minerals tablet 1 tablet  1 tablet Oral Daily Danford, Suann Larry, MD       mupirocin ointment (BACTROBAN) 2 % 1 Application  1 Application Nasal BID Edwin Dada, MD   1 Application at [id number]   oxyCODONE (Oxy IR/ROXICODONE) immediate release tablet 5 mg  5 mg Oral Q4H PRN Emilee Hero, MD   5 mg at 07/27/22 1342   pantoprazole (PROTONIX) EC tablet 40 mg  40 mg Oral BID Emilee Hero, MD   40 mg at 07/27/22 [id number]   spironolactone (ALDACTONE) tablet 12.5 mg  12.5 mg Oral Daily Edwin Dada, MD   12.5 mg at 07/27/22 [id number]     Discharge Medications: Please see discharge summary for a list of discharge medications.  Relevant Imaging Results:  Relevant Lab Results:   Additional Information [id number]  Lennart Pall, LCSW

## 2022-07-27 NOTE — Assessment & Plan Note (Signed)
As evidenced by reduced energy intake for over a week, mild fat depletion, moderate muscle depletion.

## 2022-07-27 NOTE — Progress Notes (Signed)
    Patient Name: Ellen Chandler           DOB: 06/22/1930  MRN: [id number]      Admission Date: 07/24/2022  Attending Provider: Edwin Dada, *  Primary Diagnosis: Acute cholecystitis   Level of care: Med-Surg    CROSS COVER NOTE   Date of Service   07/27/2022   [name], 89 y.o. female, was admitted on 07/24/2022 for Acute cholecystitis.    HPI/Events of Note   RN reports patient is experiencing wheezing and dyspnea.  Some improvement noted after receiving breathing treatment and being repositioning in bed.   Patient denies chest pain, palpitations, cough, sputum production, fever, abdominal discomfort, nausea, or vomiting.  She endorses shortness of breath.   At bedside, pt is A/O x4, respiratory distress noted with activity.  Respiratory: bilateral exp wheezing, fine crackles RLL, labored on 2L [place] [facility]: Regular rate and rhythm, no murmurs / rubs / gallops. No extremity edema.    Currently not receiving IV fluids and has hx of CHF on lasix and spironolactone. Diuretics initially held due to soft BP, recent SBP 120-160. Based on respiratory change, concerned for CHF exacerbation, however chest x-ray not showing vascular congestion.  Patient to continue neb treatment as needed, incentive spirometer use, and will also add on 20 mg Lasix.   Interventions/ Plan   Xopenex  Chest xray- Low lung volumes, atelectasis  Incentive spirometry  BNP- 196 Lasix x1        Raenette Rover, DNP, [place]

## 2022-07-27 NOTE — Progress Notes (Addendum)
Referring Physician(s): Dr. Leighton Ruff  Supervising Physician: Mir, Sharen Heck  Patient Status:  [facility] - In-pt  Chief Complaint: Acute cholecystitis   Subjective: Resting comfortably in bed.  States "I'm just go weak.  I need to get my strength back."  Feels slightly improved after drain placement.   Allergies: Patient has no known allergies.  Medications: Prior to Admission medications   Medication Sig Start Date End Date Taking? Authorizing Provider  acetaminophen (TYLENOL) 500 MG tablet Take 500-1,000 mg by mouth every 6 (six) hours as needed for moderate pain.   Yes [provider]  amiodarone (PACERONE) 200 MG tablet Take 1 tablet (200 mg total) by mouth daily. Patient taking differently: Take 200 mg by mouth in the morning. 02/14/22  Yes Lyda Jester M, PA-C  amoxicillin (AMOXIL) 500 MG capsule Take 2,000 mg by mouth See admin instructions. Take 2,000 mg by mouth one hour prior to dental appointments   Yes [provider]  apixaban (ELIQUIS) 5 MG TABS tablet Take 1 tablet (5 mg total) by mouth 2 (two) times daily. 02/14/22  Yes Lyda Jester M, PA-C  dapagliflozin propanediol (FARXIGA) 10 MG TABS tablet Take 10 mg by mouth daily.   Yes [provider]  furosemide (LASIX) 20 MG tablet Take 1 tablet (20 mg total) by mouth daily. 07/11/22 08/10/22 Yes Arrien, Jimmy Picket, MD  lactose free nutrition (BOOST) LIQD Take 237 mLs by mouth at bedtime.   Yes [provider]  levothyroxine (SYNTHROID, LEVOTHROID) 88 MCG tablet Take 88 mcg by mouth daily before breakfast.   Yes [provider]  metoprolol succinate (TOPROL XL) 25 MG 24 hr tablet Take 0.5 tablets (12.5 mg total) by mouth daily. 07/20/22  Yes Milford, Maricela Bo, FNP  Multiple Vitamins-Minerals (ONE-A-DAY WOMENS 50+ ADVANTAGE) TABS Take 1 tablet by mouth daily with breakfast.   Yes [provider]  MYRBETRIQ 50 MG TB24 tablet Take 50 mg by mouth in the morning.  10/03/21  Yes [provider]  pantoprazole (PROTONIX) 40 MG tablet Take 40 mg by mouth 2 (two) times daily. 09/18/21  Yes [provider]  polyethylene glycol powder (GLYCOLAX/MIRALAX) 17 GM/SCOOP powder Take 17 g by mouth daily as needed for mild constipation.   Yes [provider]  spironolactone (ALDACTONE) 25 MG tablet Take 0.5 tablets (12.5 mg total) by mouth daily. 07/20/22 08/19/22 Yes Milford, Maricela Bo, FNP     Vital Signs: BP 122/71 (BP Location: Left Arm)   Pulse 76   Temp (!) 97.4 F (36.3 C) (Oral)   Resp 18   Ht 5\' 5"  (1.651 m)   Wt 140 lb (63.5 kg)   SpO2 95%   BMI 23.30 kg/m   Physical Exam NAD, alert, lying in bed Abdomen: soft, non-tender.  RUQ drain site intact, clean, and dry. Dressing replaced. Flushes and aspirates easily.  Cloudy, bilious drainage in gravity bag.   Imaging: DG Chest Port 1 View  Result Date: 07/26/2022 CLINICAL DATA:  Shortness of breath EXAM: PORTABLE CHEST 1 VIEW COMPARISON:  07/24/2022, 06/17/2021, 01/10/2022 FINDINGS: Low lung volumes. Large air distended hiatal hernia. Hazy atelectasis left base. No consolidation, definitive pleural effusion or pneumothorax. Pigtail catheter in the right upper quadrant IMPRESSION: Low lung volumes with hazy atelectasis at the left base. Large air distended hiatal hernia. Electronically Signed   By: Donavan Foil M.D.   On: 07/26/2022 22:07   CT PERC CHOLECYSTOSTOMY  Result Date: 07/26/2022 INDICATION: 89 year old with acute cholecystitis. Patient is not a  surgical candidate at this time. EXAM: CT-GUIDED PLACEMENT OF CHOLECYSTOSTOMY TUBE MEDICATIONS: Moderate sedation ANESTHESIA/SEDATION: Moderate (conscious) sedation was employed during this procedure. A total of Versed 1 mg and Fentanyl 50 mcg was administered intravenously by the radiology nurse. Total intra-service moderate Sedation Time: 18 minutes. The patient's level of consciousness and vital signs were monitored continuously by  radiology nursing throughout the procedure under my direct supervision. FLUOROSCOPY: None COMPLICATIONS: None immediate. PROCEDURE: Informed written consent was obtained from the patient after a thorough discussion of the procedural risks, benefits and alternatives. All questions were addressed. Maximal Sterile Barrier Technique was utilized including caps, mask, sterile gowns, sterile gloves, sterile drape, hand hygiene and skin antiseptic. A timeout was performed prior to the initiation of the procedure. Patient was placed supine on CT scanner. Images through the abdomen were obtained. The large abnormal gallbladder was identified with CT. Right side of the abdomen was prepped with chlorhexidine and sterile field was created. Skin was anesthetized using 1% lidocaine. Small incision was made. Using CT guidance, an 18 gauge trocar needle was directed into the gallbladder and brownish red fluid was aspirated. Superstiff Amplatz wire was advanced into the gallbladder and the tract was dilated to accommodate a 10 [place] multipurpose drain. Approximately 130 mL of fluid was removed. Drain was attached to a gravity bag. Fluid sample was sent for culture. Follow up CT images were obtained. Drain was sutured to skin and a dressing was placed. FINDINGS: The gallbladder was markedly distended with surrounding inflammatory change and large hyperdense stones. Drain was placed within the gallbladder without traversing liver parenchyma. Gallbladder was partially decompressed at the end of the procedure. IMPRESSION: CT-guided placement of a cholecystostomy tube. Fluid was sent for culture. Electronically Signed   By: Markus Daft M.D.   On: 07/26/2022 13:59   CT ABDOMEN PELVIS W CONTRAST  Result Date: 07/24/2022 CLINICAL DATA:  Right lower quadrant pain EXAM: CT ABDOMEN AND PELVIS WITH CONTRAST TECHNIQUE: Multidetector CT imaging of the abdomen and pelvis was performed using the standard protocol following bolus administration  of intravenous contrast. RADIATION DOSE REDUCTION: This exam was performed according to the departmental dose-optimization program which includes automated exposure control, adjustment of the mA and/or kV according to patient size and/or use of iterative reconstruction technique. CONTRAST:  30mL OMNIPAQUE IOHEXOL 300 MG/ML  SOLN COMPARISON:  09/20/2017 FINDINGS: Lower chest: Moderate hiatal hernia involving the fundus and cardia, incompletely visualized, with some adjacent compressive atelectasis medially in both lung bases. No pleural or pericardial effusion. Heart size normal. Scattered ground-glass opacities in the visualized right lower lung. No pleural or pericardial effusion. Hepatobiliary: Marked dilatation of the gallbladder containing stones measured up to 3.1 cm diameter. There is wall thickening and mucosal enhancement with marked surrounding edema and inflammation. There is no intrahepatic biliary ductal dilatation. No focal liver lesion. Pancreas: Unremarkable. No pancreatic ductal dilatation or surrounding inflammatory changes. Spleen: Normal in size without focal abnormality. Adrenals/Urinary Tract: Adrenal glands are unremarkable. Kidneys are normal, without renal calculi, focal lesion, or hydronephrosis. Bladder is physiologically distended. Stomach/Bowel: Large hiatal hernia involving gastric fundus and cardia. Dilated duodenal bulb, with focal narrowing in the proximal duodenum related to the distended inflamed gallbladder. There is gas and fluid distention of proximal small bowel loops. Distal small bowel decompressed, without discrete transition point. There is gaseous distention of the cecum. There is tapered narrowing of the ascending colon related to mass effect from the gallbladder inflammation and distension. The more distal colon is nondilated with a few  scattered sigmoid diverticula; no adjacent inflammatory change. Vascular/Lymphatic: Scattered aortoiliac calcified plaque without  aneurysm. Portal vein patent. No abdominal or pelvic adenopathy. Reproductive: Status post hysterectomy. No adnexal masses. Other: No ascites.  No free air. Musculoskeletal: Moderate L1 compression deformity with approximately 20% loss of height anteriorly, and diffuse sclerosis, new since most recent radiograph 06/17/2021. Chronic L2 compression deformity stable. Grade 1 anterolisthesis L5-S1 as before. IMPRESSION: 1. Acute cholecystitis with cholelithiasis. Recommend surgical consultation. 2. Proximal small bowel distention suggesting ileus. 3. Large hiatal hernia. 4. L1 compression deformity, new since 06/17/2021. 5. Sigmoid diverticulosis. 6. Scattered ground-glass opacities in the visualized right lower lung, possibly infectious/inflammatory. Non-contrast chest CT at 3-6 months is recommended. If nodules persist, subsequent management will be based upon the most suspicious nodule(s). This recommendation follows the consensus statement: Guidelines for Management of Incidental Pulmonary Nodules Detected on CT Images:From the Fleischner Society 2017; published online before print (10.1148/radiol.SG:5268862). 7.  Aortic Atherosclerosis (ICD10-I70.0). Electronically Signed   By: Lucrezia Europe M.D.   On: 07/24/2022 16:07   DG Chest 2 View  Result Date: 07/24/2022 CLINICAL DATA:  Hypoxia, abdominal pain EXAM: CHEST - 2 VIEW COMPARISON:  07/09/2022 FINDINGS: Transverse diameter of heart is increased. There is large fixed hiatal hernia. There is significant interval improvement in aeration in both lungs. Small residual patchy infiltrates are seen in right parahilar region. There are no signs of alveolar pulmonary edema. There is no significant pleural effusion or pneumothorax. IMPRESSION: There is interval clearing of pulmonary vascular congestion. Small residual patchy densities are seen in right parahilar region suggesting residual scarring or subsegmental atelectasis. There is no focal pulmonary consolidation. Large  fixed hiatal hernia. Electronically Signed   By: Elmer Picker M.D.   On: 07/24/2022 14:55    Labs:  CBC: Recent Labs    07/24/22 1325 07/25/22 0440 07/26/22 0444 07/27/22 0038  WBC 23.2* 18.5* 14.2* 9.5  HGB 16.2* 14.3 12.2 12.4  HCT 47.8* 43.2 36.9 38.8  PLT 402* 331 268 272    COAGS: Recent Labs    07/25/22 0440 07/26/22 0444  INR 1.8* 1.5*    BMP: Recent Labs    07/24/22 1325 07/25/22 0440 07/26/22 0444 07/27/22 0038  NA 132* 127* 128* 127*  K 3.9 4.1 4.4 4.4  CL 91* 90* 95* 96*  CO2 28 27 26 22   GLUCOSE 129* 143* 117* 127*  BUN 34* 39* 33* 30*  CALCIUM 9.4 8.5* 8.1* 7.9*  CREATININE 1.20* 1.58* 1.27* 1.14*  GFRNONAA 43* 31* 40* 46*    LIVER FUNCTION TESTS: Recent Labs    07/24/22 1325 07/25/22 0440 07/26/22 0444 07/27/22 0038  BILITOT 1.1 0.6 0.5 0.4  AST 43* 51* 26 28  ALT 47* 59* 43 38  ALKPHOS 38 37* 40 50  PROT 7.2 6.1* 5.3* 5.8*  ALBUMIN 3.7 2.8* 2.4* 2.5*    Assessment and Plan: Acute cholecystitis s/p percutaneou cholecystostomy tube placement 07/26/22 by Dr. Anselm Pancoast WBC now WNL Afebrile.  Culture positive for Citrobacter freundii On abx.   Drain Location: RUQ Size: Fr size: 10 Fr Date of placement: 07/26/22  Currently to: Drain collection device: gravity 24 hour output:  Output by Drain (mL) 07/25/22 0701 - 07/25/22 1900 07/25/22 1901 - 07/26/22 0700 07/26/22 0701 - 07/26/22 1900 07/26/22 1901 - 07/27/22 0700 07/27/22 0701 - 07/27/22 1507  Closed System Drain 1 RUQ Other (Comment) 10 Fr.   300 300 85    Plan: Continue TID flushes with 5 cc NS. Record output Q shift. Dressing  changes QD or PRN if soiled.  Call IR APP or on call IR MD if difficulty flushing or sudden change in drain output.   Discharge planning: Drain to remain in place for 6-12 weeks pending ongoing surgical eval.  Therefore, drain expected to remain in place at discharge.  Orders placed for outpatient follow-up with Interventional  Radiology.   Electronically Signed: Docia Barrier, PA 07/27/2022, 3:03 PM   I spent a total of 15 Minutes at the the patient's bedside AND on the patient's hospital floor or unit, greater than 50% of which was counseling/coordinating care for acute cholecystitis.

## 2022-07-27 NOTE — Progress Notes (Signed)
  Progress Note   Patient: Ellen Chandler [id number] DOB: March 10, 1931 DOA: 07/24/2022     2 DOS: the patient was seen and examined on 07/27/2022 at 8:05AM      Brief hospital course: Ellen Chandler is an 89 y.o. F with dCHF, recent admit for CHF, HTN, hypothyroidism and pAF on Eliquis who presented with acute right upper quadrant abdominal pain.  In the ER, WBC 23K, LFTs slightly up, CT showed acute cholecystitis with cholelithiasis.     3/19: Admitted, General Surgery consutled, recommended surgery after Eliquis washout 3/20: IR consulted 3/21: Plan for percutaneous drain today      Assessment and Plan: * Acute cholecystitis WBC improved, still in a lot of pain.  Aspirate from drain growing Citrobacter - Continue Flagyl - Stop Rocephin, change to Cipro    AKI (acute kidney injury) ([place]) Baseline Cr 0.7-0.9 last fall.  Up to 1.1-1.2 this March, but >1.5 on admission.   Resolved with fluids - Avoid nephrotoxins and hypotension   Hyponatremia Sodium unchanged - Hold IV fluids - Trend Na  Hypoxia No respiratory failure.  Mild hypoxia overnight due to hypoventilation (from infradiaphragmatic pain) and atelectasis and hernia.    SLP cleared swallow.  Has nonmodifiable risk for aspiration due to debility, not oropharyngeal dysfunction.   - IS, pulmonary toilet, mobilization - Upright for all oral intake - Meals in chair  Malnutrition of moderate degree As evidenced by reduced energy intake for over a week, mild fat depletion, moderate muscle depletion.   Paroxysmal A-fib (HCC) HR controlled - Hold metoprolol - Resume Eliquis - Continue amiodarone   Hypothyroidism - Continue levothyroxine  Chronic diastolic CHF (congestive heart failure) (HCC) Today some wheezing.  Responded to albuterol.  CXR clear, clinically not volume overloaded, doubt CHF - Okay to resume spironolactone and furosemide  Essential hypertension BP improved - Hold metop - Resume  furosemide, spironolactone          Subjective: Had some hypoxia overnight, wheezing this morning, improved with albuterol.  No swelling, no chest pain, no confusion, does feel short of breath.  No fever.  Still has a lot of abdominal pain.  She is very hungry.     Physical Exam: BP 122/71 (BP Location: Left Arm)   Pulse 76   Temp (!) 97.4 F (36.3 C) (Oral)   Resp 18   Ht 5\' 5"  (1.651 m)   Wt 63.5 kg   SpO2 95%   BMI 23.30 kg/m   Frail elderly female, severely debilitated, responds to questions RRR, no murmurs, no peripheral edema Respiratory rate slightly tachypneic, wheezing bilaterally, lung sounds overall very diminished and poor air movement Abdomen extremely tender throughout, voluntary guarding noted No distention Attentive to questions, face symmetric, affect blunted by pain, moves upper extremities with severe generalized weakness, oriented x 3    Data Reviewed: Discussed with general surgery team CBC unremarkable Chest x-ray shows hiatal hernia, poor expansion, no edema or pleural effusion Patient metabolic panel shows persistent hyponatremia, normal renal function    Family Communication: Daughter-in-law by phone    Disposition: Status is: Inpatient Will need SNF when O2 weaned down, able to take PO, maybe in 1-2 days        Author: Edwin Dada, MD 07/27/2022 2:05 PM  For on call review [email or web address].

## 2022-07-27 NOTE — Progress Notes (Signed)
PT Cancellation Note  Patient Details Name: Ellen Chandler MRN: [id number] DOB: 1931-04-29   Cancelled Treatment:    Reason Eval/Treat Not Completed: Fatigue/lethargy limiting ability to participatePT returned in PM to engage pt with therapeutic activity, pt indicated she could not do it and that it would be torture to move her today. PT provided ed on importance of mobility to improve overall functional strength and attempted to have pt sit EOB. Pt adamantly declined and stated maybe another day.    Baird Lyons, PT  Adair Patter 07/27/2022, 2:34 PM

## 2022-07-27 NOTE — Evaluation (Signed)
Clinical/Bedside Swallow Evaluation Patient Details  Name: MAKETA JILLSON MRN: [id number] Date of Birth: 1931/01/13  Today's Date: 07/27/2022 Time: SLP Start Time (ACUTE ONLY): 1344 SLP Stop Time (ACUTE ONLY): 1354 SLP Time Calculation (min) (ACUTE ONLY): 10 min  Past Medical History:  Past Medical History:  Diagnosis Date   Abnormal ECG    a. Pt aware of long hx of abnl ecg->h/o normal stress testing in [place] ~ 2000.   Diastolic dysfunction    a. 04/2013 Echo: EF 60-65%, No rwma, Gr 1 DD, mild MR.   GERD (gastroesophageal reflux disease)    Hypertension    Hypothyroidism    Past Surgical History:  Past Surgical History:  Procedure Laterality Date   ABDOMINAL HYSTERECTOMY     JOINT REPLACEMENT     a. knee left - injury resulted after being attacked by a dog.   LEFT HEART CATH AND CORONARY ANGIOGRAPHY N/A 01/05/2022   Procedure: LEFT HEART CATH AND CORONARY ANGIOGRAPHY;  Surgeon: Belva Crome, MD;  Location: [facility];  Service: Cardiovascular;  Laterality: N/A;   RIGHT/LEFT HEART CATH AND CORONARY ANGIOGRAPHY N/A 01/09/2022   Procedure: RIGHT/LEFT HEART CATH AND CORONARY ANGIOGRAPHY;  Surgeon: Early Osmond, MD;  Location: [facility];  Service: Cardiovascular;  Laterality: N/A;   HPI:  89 y.o. female with medical history significant for GERD, hypertension, hyperlipidemia, hypothyroidism who presented to the emergency department with right-sided abdominal pain. Dx of acute cholecystitis. Experienced coughing episode while drinking water night of 3/21 and was made NPO; swallowing eval ordered. Recent admission for respiratory failure 3/4-07/11/22.    Assessment / Plan / Recommendation  Clinical Impression  Pt presents with normal oropharyngeal swallow with no s/s of dysphagia nor aspiration. Oral mechanism exam was normal. She demonstrated thorough mastication, the appearance of a brisk swallow, and preserved ability to drink sequential swallows of thin  liquids and mixed solid/liquid consistencies with no s/s of aspiration.  Episode of coughing with POs last night appears to have been an isolated event and not related to dysphagia. Resume regular diet, thin liquids. No SLP f/u needed. SLP Visit Diagnosis: Dysphagia, unspecified (R13.10)    Aspiration Risk  No limitations    Diet Recommendation   Regular, thin liquids  Medication Administration: Whole meds with liquid    Other  Recommendations Oral Care Recommendations: Oral care BID    Recommendations for follow up therapy are one component of a multi-disciplinary discharge planning process, led by the attending physician.  Recommendations may be updated based on patient status, additional functional criteria and insurance authorization.  Follow up Recommendations No SLP follow up        [place] Date of Onset: 07/24/22 HPI: 89 y.o. female with medical history significant for GERD, hypertension, hyperlipidemia, hypothyroidism who presented to the emergency department with right-sided abdominal pain. Dx of acute cholecystitis. Experienced coughing episode while drinking water night of 3/21 and was made NPO; swallowing eval ordered. Recent admission for respiratory failure 3/4-07/11/22. Type of Study: Bedside Swallow Evaluation Previous Swallow Assessment: no Diet Prior to this Study: NPO Temperature Spikes Noted: No Respiratory Status: Nasal cannula History of Recent Intubation: No Behavior/Cognition: Alert;Cooperative Oral Cavity Assessment: Within Functional Limits Oral Care Completed by SLP: No Oral Cavity - Dentition: Adequate natural dentition Vision: Functional for self-feeding Self-Feeding Abilities: Able to feed self Patient Positioning: Upright in bed Baseline Vocal Quality: Normal Volitional Cough: Strong Volitional Swallow: Able to elicit    Oral/Motor/Sensory Function Overall  Oral Motor/Sensory Function: Within functional limits   Ice Chips Ice chips:  Within functional limits   Thin Liquid Thin Liquid: Within functional limits    Nectar Thick Nectar Thick Liquid: Not tested   Honey Thick Honey Thick Liquid: Not tested   Puree Puree: Within functional limits   Solid     Solid: Within functional limits      Juan Quam Laurice 07/27/2022,1:57 PM  Estill Bamberg L. Tivis Ringer, MA CCC/SLP Clinical Specialist - [place] Office number [phone]

## 2022-07-27 NOTE — Progress Notes (Signed)
PT Cancellation Note  Patient Details Name: Ellen Chandler MRN: [id number] DOB: 10-Jan-1931   Cancelled Treatment:    Reason Eval/Treat Not Completed: Fatigue/lethargy limiting ability to participate. Pt had change in medical status including desaturation due to possible aspiration (NPO with SLP eval ordered) with pt now on 2 L/min and 95% at rest.  CXR (07/26/2022) indicating pt has low lung volumes with hazy atelectasis at the left base of the lung. Pt reported she was too tired to do therapy this am. PT will return for tx intervention as time allows.    Baird Lyons, PT   Adair Patter 07/27/2022, 10:32 AM

## 2022-07-27 NOTE — TOC Initial Note (Addendum)
Transition of Care [facility]) - Initial/Assessment Note    Patient Details  Name: Ellen Chandler MRN: [id number] Date of Birth: [birth year]  Transition of Care [facility]) CM/SW Contact:    Lennart Pall, LCSW Phone Number: 07/27/2022, 2:40 PM  Clinical Narrative:                  Met with pt and have spoken with daughter-in-law to review therapy recommendations for SNF rehab.  Pt is very fatigued but states she understands and is agreeable with this plan.  Family very agreeable with plan as pt lives alone in her [place] apt at [place].  Have alerted [employer] to begin the insurance authorization process. FL2  sent out today to facilities and will ask weekend TOC to follow up with pt/ daughter-in-law on any bed offers.  Expected Discharge Plan: Skilled Nursing Facility Barriers to Discharge: Continued Medical Work up, Ship broker, SNF Pending bed offer   Patient Goals and CMS Choice Patient states their goals for this hospitalization and ongoing recovery are:: eventually return to her [place] apt   Choice offered to / list presented to : Patient, Adult Children      Expected Discharge Plan and Services In-house Referral: Clinical Social Work   Post Acute Care Choice: [place] Living arrangements for the past 2 months: Apartment ([place] apt)                 DME Arranged: N/A DME Agency: NA                  Prior Living Arrangements/Services Living arrangements for the past 2 months: Apartment ([place] apt) Lives with:: Self Patient language and need for interpreter reviewed:: Yes Do you feel safe going back to the place where you live?: Yes      Need for Family Participation in Patient Care: No (Comment) Care giver support system in place?: Yes (comment)   Criminal Activity/Legal Involvement Pertinent to Current Situation/Hospitalization: No - Comment as needed  Activities of Daily Living Home Assistive Devices/Equipment: Walker (specify  type) ADL Screening (condition at time of admission) Patient's cognitive ability adequate to safely complete daily activities?: Yes Is the patient deaf or have difficulty hearing?: No Does the patient have difficulty seeing, even when wearing glasses/contacts?: No Does the patient have difficulty concentrating, remembering, or making decisions?: No Patient able to express need for assistance with ADLs?: No Does the patient have difficulty dressing or bathing?: No Independently performs ADLs?: Yes (appropriate for developmental age) Does the patient have difficulty walking or climbing stairs?: No Weakness of Legs: None Weakness of Arms/Hands: None  Permission Sought/Granted Permission sought to share information with : Family Supports, Customer service manager Permission granted to share information with : Yes, Verbal Permission Granted  Share Information with NAME: daughter-in-law, Pansie Sontag @ [phone]  Permission granted to share info w AGENCY: SNFs        Emotional Assessment   Attitude/Demeanor/Rapport: Lethargic Affect (typically observed): Accepting Orientation: : Oriented to Self, Oriented to Place, Oriented to  Time, Oriented to Situation   Psych Involvement: No (comment)  Admission diagnosis:  Acute cholecystitis [K81.0] Acute calculous cholecystitis [K80.00] Abdominal pain, unspecified abdominal location [R10.9] Patient Active Problem List   Diagnosis Date Noted   Malnutrition of moderate degree 07/27/2022   Hypoxia 07/27/2022   Acute cholecystitis 07/24/2022   Acute on chronic diastolic CHF (congestive heart failure) ([place]) 07/09/2022   Acute hypoxemic respiratory failure (HCC) 07/09/2022   Paroxysmal A-fib ([place])  07/09/2022   AKI (acute kidney injury) ([place]) 02/06/2022   Atrial flutter ([place]) 02/04/2022   Acute on chronic combined systolic and diastolic CHF (congestive heart failure) ([place]) 02/03/2022   GERD (gastroesophageal reflux disease) 02/03/2022    Hypothyroidism 02/03/2022   Acute on chronic combined systolic and diastolic HF (heart failure) ([place]) 01/17/2022   Non-ST elevation (NSTEMI) myocardial infarction ([place]) 01/05/2022   History of ETOH abuse 01/04/2022   Chronic diastolic CHF (congestive heart failure) ([place]) 01/04/2022   UTI (urinary tract infection) 06/17/2016   Nonspecific abnormal electrocardiogram (ECG) (EKG) 05/06/2013   Acute bronchitis 05/06/2013   Essential hypertension [id number]   Acute diastolic CHF (congestive heart failure) ([place]) 05/06/2013   Dehydration 05/05/2013   CAP (community acquired pneumonia) 05/05/2013   Orthostatic hypotension 05/05/2013   Hyponatremia 05/05/2013   PCP:  Deland Pretty, MD Pharmacy:   [id number] [address] [place] [address] Phone: [phone] [phone]     Social Determinants of Health (SDOH) Social History: SDOH Screenings   Food Insecurity: No Food Insecurity (07/24/2022)  Housing: Low Risk  (07/24/2022)  Transportation Needs: No Transportation Needs (07/24/2022)  Utilities: Not At Risk (07/24/2022)  Alcohol Screen: Low Risk  (07/10/2022)  Financial Resource Strain: Low Risk  (07/10/2022)  Tobacco Use: Low Risk  (07/24/2022)   SDOH Interventions:     Readmission Risk Interventions    07/27/2022    2:36 PM  Readmission Risk Prevention Plan  Transportation Screening Complete  PCP or Specialist Appt within 3-5 Days Complete  HRI or [place] Complete  Social Work Consult for [place] Planning/Counseling Complete  Palliative Care Screening Not Applicable  Medication Review Press photographer) Complete

## 2022-07-27 NOTE — Plan of Care (Signed)
  Problem: Health Behavior/Discharge Planning: Goal: Ability to manage health-related needs will improve Outcome: Progressing   Problem: Clinical Measurements: Goal: Ability to maintain clinical measurements within normal limits will improve Outcome: Progressing   Problem: Clinical Measurements: Goal: Will remain free from infection Outcome: Progressing   

## 2022-07-27 NOTE — Progress Notes (Signed)
Initial Nutrition Assessment  DOCUMENTATION CODES:   Non-severe (moderate) malnutrition in context of acute illness/injury  INTERVENTION:   Once diet advanced: -Ensure Plus High Protein po BID, each supplement provides 350 kcal and 20 grams of protein. -chocolate -Multivitamin with minerals daily  NUTRITION DIAGNOSIS:   Moderate Malnutrition related to acute illness (abdominal pain r/t acute cholecystitis) as evidenced by energy intake < 75% for > 7 days, mild fat depletion, moderate muscle depletion.  GOAL:   Patient will meet greater than or equal to 90% of their needs  MONITOR:   PO intake, Supplement acceptance, Labs, Weight trends, I & O's  REASON FOR ASSESSMENT:   Malnutrition Screening Tool    ASSESSMENT:   89 y.o. F with dCHF, recent admit for CHF, HTN, hypothyroidism and pAF on Eliquis who presented with acute right upper quadrant abdominal pain. CT showed acute cholecystitis with cholelithiasis  3/21: s/p CT guided cholecystostomy tube placement   Patient in room, states she feels better now than she did earlier this morning. She is still NPO, awaiting SLP evaluation.  Pt reports she hasn't had issues with swallowing at home.  States she has not been eating well since having stomach pain which started several months ago. She eats 3 small meals daily. For breakfast will have honey nut cheerios with blueberries. For lunch she makes a sandwich with ham, eggs and pineapple. For dinner she states this varies and could be leftovers, did not specify what this could be. Pt states she drinks a Boost or Ensure at night. Is agreeable to drinking these while here once her diet is advanced.   Per weight records, no significant weight changes noted.  Medications: Lasix, Aldactone  Labs reviewed: Low Na    NUTRITION - FOCUSED PHYSICAL EXAM:  Flowsheet Row Most Recent Value  Orbital Region Mild depletion  Upper Arm Region Mild depletion  Thoracic and Lumbar Region Unable  to assess  Buccal Region Mild depletion  Temple Region Mild depletion  Clavicle Bone Region Severe depletion  Clavicle and Acromion Bone Region Moderate depletion  Scapular Bone Region Unable to assess  Dorsal Hand Moderate depletion  [left hand, mild on dominant hand]  Patellar Region Mild depletion  Anterior Thigh Region Mild depletion  Posterior Calf Region Moderate depletion  Edema (RD Assessment) None  Hair Reviewed  Eyes Reviewed  Mouth Reviewed  [dry]  Skin Reviewed  [ecchymosis of arms and legs]  Nails Reviewed       Diet Order:   Diet Order             Diet NPO time specified Except for: Ice Chips  Diet effective now                   EDUCATION NEEDS:   No education needs have been identified at this time  Skin:  Skin Assessment: Reviewed RN Assessment  Last BM:  3/18  Height:   Ht Readings from Last 1 Encounters:  07/24/22 5\' 5"  (1.651 m)    Weight:   Wt Readings from Last 1 Encounters:  07/24/22 63.5 kg    BMI:  Body mass index is 23.3 kg/m.  Estimated Nutritional Needs:   Kcal:  1550-1750  Protein:  75-85g  Fluid:  1.7L/day   Clayton Bibles, MS, RD, LDN Inpatient Clinical Dietitian Contact information available via Amion

## 2022-07-27 NOTE — Assessment & Plan Note (Signed)
No respiratory failure.  Mild hypoxia overnight due to hypoventilation (from infradiaphragmatic pain) and atelectasis and hernia.   - IS, pulmonary toilet, mobilization

## 2022-07-27 NOTE — Progress Notes (Addendum)
Dressing to right abdomen changed this am. No drainage around dressing. Placed 4x4 gauze and taped down with cloth tape. Tolerated well. Sutures intact. Drain is emptying out brown thick fluid. Recorded in chart. Levothyroxine held this am due to NPO status. Ice chips only given to patient. SLP is to come see patient today for evaluation and treat.

## 2022-07-28 DIAGNOSIS — I5032 Chronic diastolic (congestive) heart failure: Secondary | ICD-10-CM | POA: Diagnosis not present

## 2022-07-28 DIAGNOSIS — K810 Acute cholecystitis: Secondary | ICD-10-CM | POA: Diagnosis not present

## 2022-07-28 DIAGNOSIS — N179 Acute kidney failure, unspecified: Secondary | ICD-10-CM | POA: Diagnosis not present

## 2022-07-28 DIAGNOSIS — E871 Hypo-osmolality and hyponatremia: Secondary | ICD-10-CM | POA: Diagnosis not present

## 2022-07-28 LAB — CBC
HCT: 36.3 % (ref 36.0–46.0)
Hemoglobin: 11.8 g/dL — ABNORMAL LOW (ref 12.0–15.0)
MCH: 31.1 pg (ref 26.0–34.0)
MCHC: 32.5 g/dL (ref 30.0–36.0)
MCV: 95.8 fL (ref 80.0–100.0)
Platelets: 276 10*3/uL (ref 150–400)
RBC: 3.79 MIL/uL — ABNORMAL LOW (ref 3.87–5.11)
RDW: 14.3 % (ref 11.5–15.5)
WBC: 7.6 10*3/uL (ref 4.0–10.5)
nRBC: 0.3 % — ABNORMAL HIGH (ref 0.0–0.2)

## 2022-07-28 LAB — COMPREHENSIVE METABOLIC PANEL
ALT: 29 U/L (ref 0–44)
AST: 18 U/L (ref 15–41)
Albumin: 2.2 g/dL — ABNORMAL LOW (ref 3.5–5.0)
Alkaline Phosphatase: 43 U/L (ref 38–126)
Anion gap: 9 (ref 5–15)
BUN: 30 mg/dL — ABNORMAL HIGH (ref 8–23)
CO2: 26 mmol/L (ref 22–32)
Calcium: 7.3 mg/dL — ABNORMAL LOW (ref 8.9–10.3)
Chloride: 93 mmol/L — ABNORMAL LOW (ref 98–111)
Creatinine, Ser: 1.02 mg/dL — ABNORMAL HIGH (ref 0.44–1.00)
GFR, Estimated: 53 mL/min — ABNORMAL LOW (ref >60)
Glucose, Bld: 129 mg/dL — ABNORMAL HIGH (ref 70–99)
Potassium: 3.4 mmol/L — ABNORMAL LOW (ref 3.5–5.1)
Sodium: 128 mmol/L — ABNORMAL LOW (ref 135–145)
Total Bilirubin: 0.6 mg/dL (ref 0.3–1.2)
Total Protein: 5.4 g/dL — ABNORMAL LOW (ref 6.5–8.1)

## 2022-07-28 NOTE — TOC Progression Note (Addendum)
Transition of Care [facility]) - Progression Note    Patient Details  Name: Ellen Chandler MRN: [id number] Date of Birth: May 05, 1931  Transition of [facility]) CM/SW Contact  Henrietta Dine, RN Phone Number: 07/28/2022, 8:41 AM  Clinical Narrative:    Received call from Marlowe Kays at [facility]; she says auth for SNF is still under review; she gave auth for ambulance, auth # [id number], and it is valid for 90 days; awaiting SNF auth.  [id number]- called by Marlowe Kays w/ [facility]; she says pt has been approved for SNF auth # [id number]; and the [facility] is good for 7 days; she says pt has 5 business days to transition to facility.   Expected Discharge Plan: Skilled Nursing Facility Barriers to Discharge: Continued Medical Work up, Ship broker, SNF Pending bed offer  Expected Discharge Plan and Services In-house Referral: Clinical Social Work   Post Acute Care Choice: [place] Living arrangements for the past 2 months: Apartment ([place] apt)                 DME Arranged: N/A DME Agency: NA                   Social Determinants of Health (SDOH) Interventions SDOH Screenings   Food Insecurity: No Food Insecurity (07/24/2022)  Housing: Low Risk  (07/24/2022)  Transportation Needs: No Transportation Needs (07/24/2022)  Utilities: Not At Risk (07/24/2022)  Alcohol Screen: Low Risk  (07/10/2022)  Financial Resource Strain: Low Risk  (07/10/2022)  Tobacco Use: Low Risk  (07/24/2022)    Readmission Risk Interventions    07/27/2022    2:36 PM  Readmission Risk Prevention Plan  Transportation Screening Complete  PCP or Specialist Appt within 3-5 Days Complete  HRI or [place] Complete  Social Work Consult for [place] Planning/Counseling Complete  Palliative Care Screening Not Applicable  Medication Review Press photographer) Complete

## 2022-07-28 NOTE — Progress Notes (Signed)
Patient restless through the night. O2 wean to 1 liter. During the night pt c/o of shortness of breath and was given neb tx. PRN pain medication given several times during the shift.

## 2022-07-28 NOTE — Progress Notes (Signed)
Progress Note   Patient: Ellen Chandler [id number] DOB: [birth year] DOA: 07/24/2022     3 DOS: the patient was seen and examined on 07/28/2022 at 9:12 AM      Brief hospital course: Ellen Chandler is an 89 y.o. F with dCHF, recent admit for CHF, HTN, hypothyroidism and pAF on Eliquis who presented with acute right upper quadrant abdominal pain.  In the ER, WBC 23K, LFTs slightly up, CT showed acute cholecystitis with cholelithiasis.     3/19: Admitted, General Surgery consutled, recommended surgery after Eliquis washout 3/20: IR consulted 3/21: Percutaneous drain placed 3/23: Medical work up complete      Assessment and Plan: * Acute cholecystitis Admitted on antibiotics, Gen Surg consulted, not surgical candidate. IR drain placed 3/21, aspirate from drain growing Citrobacter Improving - Continue Cipro/Flagyl   AKI (acute kidney injury) ([place]) Baseline Cr 0.7-0.9 last fall.  Up to 1.1-1.2 this March, but >1.5 on admission.   Resolved with fluids - Avoid nephrotoxins and hypotension   Hyponatremia Sodium stable, asymptomatic. Maybe contributing to weakness, but quite mild.  Poor prognostic sign.  - Hold fluids  Hypoxia No respiratory failure.  Mild hypoxia due to hypoventilation (from infradiaphragmatic pain) and atelectasis and hernia.   - IS, pulmonary toilet, mobilization  Malnutrition of moderate degree As evidenced by reduced energy intake for over a week, mild fat depletion, moderate muscle depletion.   Paroxysmal A-fib (HCC) HR controlled - Hold metoprolol - Continue Eliquis and amiodarone   Hypothyroidism - Continue levothyroxine  Chronic diastolic CHF (congestive heart failure) (HCC) Breathing stable.  No clincal CHF - Continue spironolactone and furosemide  Essential hypertension BP controlled - Hold metoprolol - Continue furosemide, spironolactone          Subjective: No events overnight, no nursing concerns.  Patient feels tired she  has generalized weakness.  No fever.  She still has abdominal pain although this is gradually improving.  Her appetite is good.     Physical Exam: BP 108/62 (BP Location: Left Arm)   Pulse 78   Temp (!) 97.5 F (36.4 C) (Oral)   Resp 20   Ht 5\' 5"  (1.651 m)   Wt 63.5 kg   SpO2 96%   BMI 23.30 kg/m   Elderly adult female, severely debilitated, interactive and appropriate RRR, no murmurs, no peripheral edema Respiratory rate increased, respirations very shallow, lung sounds diminished, no rales or wheezes appreciated Abdomen soft, diffuse tenderness, worse on the right, a lot of wincing and grimacing and voluntary guarding, no rigidity or rebound or distention Responds appropriately to questions, makes eye contact, oriented to person, place, time, face symmetric, speech fluent, severe generalized symmetric weakness    Data Reviewed: Basic metabolic panel shows sodium 128, improving Potassium 3.4, creatinine 1.02 and stable Corrected calcium is normal CBC shows improving white count  Family Communication: None present    Disposition: Status is: Inpatient All expected diagnostic testing that requires acute inpatient care has been completed.    At this point, the patient is medically ready to transition to an outpatient treatment plan, but because of her severely diminished functional status from baseline (mostly independent with ADLs at baseline, currently unable to sit on edge of bed without assistance), age, and percutaneous drain, it is my medical opinion that discharge home at this time would pose a high risk unnecessary harm.  Safe disposition is being sought, and the patient will be ready to transition to that setting as soon as arranged.  Author: Edwin Dada, MD 07/28/2022 10:14 AM  For on call review [email or web address].

## 2022-07-29 DIAGNOSIS — I5032 Chronic diastolic (congestive) heart failure: Secondary | ICD-10-CM | POA: Diagnosis not present

## 2022-07-29 DIAGNOSIS — N179 Acute kidney failure, unspecified: Secondary | ICD-10-CM | POA: Diagnosis not present

## 2022-07-29 DIAGNOSIS — K810 Acute cholecystitis: Secondary | ICD-10-CM | POA: Diagnosis not present

## 2022-07-29 DIAGNOSIS — E871 Hypo-osmolality and hyponatremia: Secondary | ICD-10-CM | POA: Diagnosis not present

## 2022-07-29 LAB — COMPREHENSIVE METABOLIC PANEL
ALT: 25 U/L (ref 0–44)
AST: 19 U/L (ref 15–41)
Albumin: 2.5 g/dL — ABNORMAL LOW (ref 3.5–5.0)
Alkaline Phosphatase: 41 U/L (ref 38–126)
Anion gap: 9 (ref 5–15)
BUN: 30 mg/dL — ABNORMAL HIGH (ref 8–23)
CO2: 26 mmol/L (ref 22–32)
Calcium: 7.5 mg/dL — ABNORMAL LOW (ref 8.9–10.3)
Chloride: 90 mmol/L — ABNORMAL LOW (ref 98–111)
Creatinine, Ser: 1.01 mg/dL — ABNORMAL HIGH (ref 0.44–1.00)
GFR, Estimated: 53 mL/min — ABNORMAL LOW (ref >60)
Glucose, Bld: 122 mg/dL — ABNORMAL HIGH (ref 70–99)
Potassium: 3.4 mmol/L — ABNORMAL LOW (ref 3.5–5.1)
Sodium: 125 mmol/L — ABNORMAL LOW (ref 135–145)
Total Bilirubin: 0.6 mg/dL (ref 0.3–1.2)
Total Protein: 5.7 g/dL — ABNORMAL LOW (ref 6.5–8.1)

## 2022-07-29 LAB — CBC
HCT: 38.5 % (ref 36.0–46.0)
Hemoglobin: 12.8 g/dL (ref 12.0–15.0)
MCH: 31.8 pg (ref 26.0–34.0)
MCHC: 33.2 g/dL (ref 30.0–36.0)
MCV: 95.5 fL (ref 80.0–100.0)
Platelets: 292 10*3/uL (ref 150–400)
RBC: 4.03 MIL/uL (ref 3.87–5.11)
RDW: 14.3 % (ref 11.5–15.5)
WBC: 8.8 10*3/uL (ref 4.0–10.5)
nRBC: 0 % (ref 0.0–0.2)

## 2022-07-29 LAB — AEROBIC/ANAEROBIC CULTURE W GRAM STAIN (SURGICAL/DEEP WOUND): Gram Stain: NONE SEEN

## 2022-07-29 LAB — OSMOLALITY, URINE: Osmolality, Ur: 362 mOsm/kg (ref 300–900)

## 2022-07-29 LAB — NA AND K (SODIUM & POTASSIUM), RAND UR
Potassium Urine: 20 mmol/L
Sodium, Ur: 13 mmol/L

## 2022-07-29 MED ORDER — VANCOMYCIN HCL IN DEXTROSE 1-5 GM/200ML-% IV SOLN
1000.0000 mg | Freq: Once | INTRAVENOUS | Status: DC
  Filled 2022-07-29: qty 200

## 2022-07-29 MED ORDER — PIPERACILLIN-TAZOBACTAM 3.375 G IVPB
3.3750 g | Freq: Three times a day (TID) | INTRAVENOUS | Status: DC
  Administered 2022-07-29: 3.375 g via INTRAVENOUS
  Filled 2022-07-29: qty 50

## 2022-07-29 MED ORDER — CIPROFLOXACIN IN D5W 400 MG/200ML IV SOLN
400.0000 mg | Freq: Two times a day (BID) | INTRAVENOUS | Status: DC
  Administered 2022-07-30 – 2022-07-31 (×3): 400 mg via INTRAVENOUS
  Filled 2022-07-29 (×3): qty 200

## 2022-07-29 MED ORDER — LEVALBUTEROL HCL 0.63 MG/3ML IN NEBU
0.6300 mg | INHALATION_SOLUTION | Freq: Four times a day (QID) | RESPIRATORY_TRACT | Status: AC | PRN
  Administered 2022-07-29 (×2): 0.63 mg via RESPIRATORY_TRACT
  Filled 2022-07-29 (×3): qty 3

## 2022-07-29 MED ORDER — SODIUM CHLORIDE 1 G PO TABS: 1.0000 g | ORAL_TABLET | Freq: Three times a day (TID) | ORAL | Status: DC

## 2022-07-29 MED ORDER — SODIUM CHLORIDE 1 G PO TABS
1.0000 g | ORAL_TABLET | Freq: Two times a day (BID) | ORAL | Status: DC
  Administered 2022-07-29 – 2022-07-31 (×4): 1 g via ORAL
  Filled 2022-07-29 (×4): qty 1

## 2022-07-29 MED ORDER — VANCOMYCIN HCL 750 MG/150ML IV SOLN: 750.0000 mg | INTRAVENOUS | Status: DC

## 2022-07-29 MED ORDER — PIPERACILLIN-TAZOBACTAM 3.375 G IVPB
3.3750 g | Freq: Three times a day (TID) | INTRAVENOUS | Status: DC
  Administered 2022-07-29 – 2022-07-31 (×5): 3.375 g via INTRAVENOUS
  Filled 2022-07-29 (×6): qty 50

## 2022-07-29 NOTE — Progress Notes (Signed)
  Progress Note   Patient: Ellen Chandler [id number] DOB: [birth year] DOA: 07/24/2022     4 DOS: the patient was seen and examined on 07/29/2022 at 11:20AM      Brief hospital course: Mrs. Applebaum is an 89 y.o. F with dCHF, recent admit for CHF, HTN, hypothyroidism and pAF on Eliquis who presented with acute right upper quadrant abdominal pain.  In the ER, WBC 23K, LFTs slightly up, CT showed acute cholecystitis with cholelithiasis.     3/19: Admitted, General Surgery consutled, recommended surgery after Eliquis washout 3/20: IR consulted 3/21: Percutaneous drain placed 3/23: Medical work up complete     Assessment and Plan: * Acute cholecystitis Admitted on antibiotics, Gen Surg consulted, not surgical candidate. IR drain placed 3/21, aspirate from drain growing Citrobacter, E faecium Unchanged  - Continue Cipro/Flagyl - Add vancomycin - Follow cultures    AKI (acute kidney injury) ([place]) Resolved with fluids   Hyponatremia Na down to 125, weak, tired, no seziures, no confusion Appears euvolemic.   - Check urine electroyltes - Start sodium tablets, low dose - Fluid restriction - Hold furos for now  Hypoxia No respiratory failure.  Mild hypoxia due to hypoventilation (from infradiaphragmatic pain) and atelectasis and hernia.   - IS, pulmonary toilet, mobilization  Malnutrition of moderate degree As evidenced by reduced energy intake for over a week, mild fat depletion, moderate muscle depletion.   Paroxysmal A-fib (HCC) HR controlled - Hold metoprolol - Continue Eliquis and amiodarone   Hypothyroidism - Continue levothyroxine  Chronic diastolic CHF (congestive heart failure) (HCC) Breathing stable.  No clinical CHF - Continue spironolactone  - Hold furos  Essential hypertension BP soft - Hold metoprolol, furos - Continue spironolactone          Subjective: Weak,tired, still significant abdominal pain.  Nursing not mobilizing patient out  of bed.  No fever, no confusion.  NO seizures, no vomiting.     Physical Exam: BP 103/62   Pulse 79   Temp 98.3 F (36.8 C) (Oral)   Resp 14   Ht 5\' 5"  (1.651 m)   Wt 63.5 kg   SpO2 100%   BMI 23.30 kg/m   Frail elderly adult female, lying in bed, tired, responds to questions RRR, no murmurs, no peripheral edema, no JVD Respiratory effort shallow, somewhat fast, no rales or wheezes, overall diminished Abdomen severe tenderness, guarding, patient is my hands away Attends to questions, affect blunted by pain, face symmetric, speech fluent, generalized weakness but symmetric strength, oriented to person, place, time    Data Reviewed: Microbiology reviewed and summarized above Basic metabolic panel shows mild hypokalemia, hyponatremia worsening, stable renal function Complete blood count normal   Family Communication:     Disposition: Status is: Inpatient No further medical work up planned.  Functional status diminshed from basline and not able to return to ILF.  Will need substantial time in rehab to return to PLoF        Author: Edwin Dada, MD 07/29/2022 12:36 PM  For on call review [email or web address].

## 2022-07-29 NOTE — Consult Note (Signed)
[facility]    Date of Admission:  07/24/2022     Reason for Consult: sepsis/cholecystitis    Referring Provider: Loleta Books     Lines:  3/21-c ruq biliary drain Peripheral iv's  Abx: 3/24-c piptazo 3/22-c cipro   3/24 vanc 3/19-22 ceftriaxone      3/19-20 piptazo  Assessment: 89 yo female admitted with sepsis on 3/19 in setting of calculous cholecystitis  Patient nonsurgical candidate and had undergone IR directed perc-biliary drain 3/21  Fluid culture 3/21 had grown ecoli, citrobacter fruendii, and e faecium  Patient is clinically improving with wbc count but still painful. The drain today 3/24 is not draining much. Lft normalized  Surgery had deemed patient high risk for interval cholecystectomy but plans to f/u outpatient   Plan: Given the current set of microbes on 3/21 biliary drain, would do cipro/piptazo Duration for this would be 7 days from today 3/24 When she is taking PO well could transition to po cipro and amox-clav to finish the 7 day course on 3/30 As long as the drain remains functional she should be fine, but is at risk for other environment bacterial infection She doesn't appear to be surgical candidate though, however She'll need to follow up with ir and surgery outpatient Will see her once more and make sure no other complication before signing off No need for id clinic f/u Discussed with primary team   I spent more than 80 minute reviewing data/chart, and coordinating care and >50% direct face to face time providing counseling/discussing diagnostics/treatment plan with patient   ------------------------------------------------ Principal Problem:   Acute cholecystitis Active Problems:   Hyponatremia   Essential hypertension   Chronic diastolic CHF (congestive heart failure) (HCC)   Hypothyroidism   AKI (acute kidney injury) ([place])   Paroxysmal A-fib ([place])   Malnutrition of moderate degree    Hypoxia    HPI: Ellen Chandler is a 89 y.o. female pmh gerd, hypothyroidism, afib on amio/eliquis, dm2, chf, admitted with sepsis on 3/19 in setting of calculous cholecystitis  Hx via patient & chart. Daughter in law also available  She developed acute 1 day onset of right abd pain and came to [facility] after it was unremitting. Associated nausea. No vomiting. No diarrhea. No f/c  No prior cholecystitis  Afebrile on presentation but with high leukocytosis Ct showed calculous cholecystitis Surgery deemed not a candidate for surgical intervention Bcx negative Started on piptazo Ir put it perc biliary drain 3/21 and cx growing e faecium/citrobacter freundii, and ecoli She is to be transitioned to cipro, and to continue piptazo  She is clinically improving with leukocytosis trend but still have abd pain and generalized weakness  Lft normalized    Family History  Problem Relation Age of Onset   Heart attack Mother        died @ 14.   Diabetes type II Father        died @ 37.   Breast cancer Sister        died @ 40.    Social History   Tobacco Use   Smoking status: Never   Smokeless tobacco: Never  Vaping Use   Vaping Use: Never used  Substance Use Topics   Alcohol use: Never   Drug use: No    No Known Allergies  Review of Systems: ROS All Other ROS was negative, except mentioned above   Past Medical History:  Diagnosis Date   Abnormal  ECG    a. Pt aware of long hx of abnl ecg->h/o normal stress testing in [place] ~ 2000.   Diastolic dysfunction    a. 04/2013 Echo: EF 60-65%, No rwma, Gr 1 DD, mild MR.   GERD (gastroesophageal reflux disease)    Hypertension    Hypothyroidism        Scheduled Meds:  amiodarone  200 mg Oral Daily   apixaban  5 mg Oral BID   Chlorhexidine Gluconate Cloth  6 each Topical Q0600   feeding supplement  237 mL Oral BID BM   levothyroxine  88 mcg Oral QAC breakfast   multivitamin with minerals  1 tablet Oral Daily    mupirocin ointment  1 Application Nasal BID   pantoprazole  40 mg Oral BID   sodium chloride  1 g Oral BID WC   spironolactone  12.5 mg Oral Daily   Continuous Infusions:  ciprofloxacin 400 mg (07/29/22 0341)   PRN Meds:.acetaminophen **OR** acetaminophen, levalbuterol, lip balm, oxyCODONE   OBJECTIVE: Blood pressure 107/63, pulse 78, temperature 97.9 F (36.6 C), temperature source Oral, resp. rate 16, height 5\' 5"  (1.651 m), weight 63.5 kg, SpO2 96 %.  Physical Exam General/constitutional: mild distress complaining of abd pain, conversant, pleasant otherwise HEENT: Normocephalic, PER, Conj Clear, EOMI, Oropharynx clear Neck supple CV: rrr no mrg Lungs: clear to auscultation, normal respiratory effort Abd: Soft, tender right sided; no rebound/guarding; the drain with catheter bag has trace green fluid but nothing much Ext: no edema Skin: No Rash Neuro: nonfocal MSK: no peripheral joint swelling/tenderness/warmth; back spines nontender       Lab Results Lab Results  Component Value Date   WBC 8.8 07/29/2022   HGB 12.8 07/29/2022   HCT 38.5 07/29/2022   MCV 95.5 07/29/2022   PLT 292 07/29/2022    Lab Results  Component Value Date   CREATININE 1.01 (H) 07/29/2022   BUN 30 (H) 07/29/2022   NA 125 (L) 07/29/2022   K 3.4 (L) 07/29/2022   CL 90 (L) 07/29/2022   CO2 26 07/29/2022    Lab Results  Component Value Date   ALT 25 07/29/2022   AST 19 07/29/2022   ALKPHOS 41 07/29/2022   BILITOT 0.6 07/29/2022      Microbiology: Recent Results (from the past 240 hour(s))  Surgical pcr screen     Status: Abnormal   Collection Time: 07/25/22  8:52 PM   Specimen: Nasal Mucosa; Nasal Swab  Result Value Ref Range Status   MRSA, PCR NEGATIVE NEGATIVE Final   Staphylococcus aureus POSITIVE (A) NEGATIVE Final    Comment: (NOTE) The Xpert SA Assay (FDA approved for NASAL specimens in patients 23 years of age and older), is one component of a comprehensive surveillance  program. It is not intended to diagnose infection nor to guide or monitor treatment. Performed at [facility], [place] [address], [place], [place] [address]   Aerobic/Anaerobic Culture w Gram Stain (surgical/deep wound)     Status: None   Collection Time: 07/26/22 12:29 PM   Specimen: BILE  Result Value Ref Range Status   Specimen Description   Final    BILE Performed at [facility] [address], [place], [place] [address]    Special Requests   Final    NONE Performed at [facility], [place] [address] [place], [place] [address]    Gram Stain   Final    NO WBC SEEN FEW GRAM POSITIVE RODS RARE  GRAM POSITIVE COCCI IN CHAINS RARE GRAM NEGATIVE RODS    Culture   Final    ABUNDANT CITROBACTER FREUNDII ABUNDANT ESCHERICHIA COLI ABUNDANT ENTEROCOCCUS FAECIUM ABUNDANT BACTEROIDES VULGATUS BETA LACTAMASE POSITIVE Performed at [facility], [place] [address] [place], [place] [address]    Report Status 07/29/2022 FINAL  Final   Organism ID, Bacteria CITROBACTER FREUNDII  Final   Organism ID, Bacteria ESCHERICHIA COLI  Final   Organism ID, Bacteria ENTEROCOCCUS FAECIUM  Final      Susceptibility   Citrobacter freundii - MIC*    CEFEPIME 2 SENSITIVE Sensitive     CEFTAZIDIME 4 SENSITIVE Sensitive     CEFTRIAXONE >=64 RESISTANT Resistant     CIPROFLOXACIN <=0.25 SENSITIVE Sensitive     GENTAMICIN >=16 RESISTANT Resistant     IMIPENEM <=0.25 SENSITIVE Sensitive     TRIMETH/SULFA >=320 RESISTANT Resistant     PIP/TAZO <=4 SENSITIVE Sensitive     * ABUNDANT CITROBACTER FREUNDII   Escherichia coli - MIC*    AMPICILLIN >=32 RESISTANT Resistant     CEFEPIME <=0.12 SENSITIVE Sensitive     CEFTAZIDIME <=1 SENSITIVE Sensitive     CEFTRIAXONE <=0.25 SENSITIVE Sensitive     CIPROFLOXACIN >=4 RESISTANT Resistant     GENTAMICIN <=1 SENSITIVE Sensitive     IMIPENEM <=0.25 SENSITIVE Sensitive     TRIMETH/SULFA >=320 RESISTANT  Resistant     AMPICILLIN/SULBACTAM 16 INTERMEDIATE Intermediate     PIP/TAZO <=4 SENSITIVE Sensitive     * ABUNDANT ESCHERICHIA COLI   Enterococcus faecium - MIC*    AMPICILLIN <=2 SENSITIVE Sensitive     VANCOMYCIN <=0.5 SENSITIVE Sensitive     GENTAMICIN SYNERGY SENSITIVE Sensitive     * ABUNDANT ENTEROCOCCUS FAECIUM     Serology:    Imaging: If present, new imagings (plain films, ct scans, and mri) have been personally visualized and interpreted; radiology reports have been reviewed. Decision making incorporated into the Impression / Recommendations.  3/19 ct abd pelv with contrast 1. Acute cholecystitis with cholelithiasis. Recommend surgical consultation. 2. Proximal small bowel distention suggesting ileus. 3. Large hiatal hernia. 4. L1 compression deformity, new since 06/17/2021. 5. Sigmoid diverticulosis. 6. Scattered ground-glass opacities in the visualized right lower lung, possibly infectious/inflammatory. Non-contrast chest CT at 3-6 months is recommended. If nodules persist, subsequent management will be based upon the most suspicious nodule(s). This recommendation follows the consensus statement: Guidelines for Management of Incidental Pulmonary Nodules Detected on CT Images:From the Fleischner Society 2017; published online before print (10.1148/radiol.SG:5268862). 7.  Aortic Atherosclerosis  Jabier Mutton, [facility] [place] [phone] pager    07/29/2022, 2:32 PM

## 2022-07-29 NOTE — Progress Notes (Addendum)
Pharmacy Antibiotic Note  Ellen Chandler is a 89 y.o. female admitted on 07/24/2022 with IAI.  Pharmacy has been consulted for vancomycin dosing.  Pt is also on ciprofloxacin 400 mg IV q12h and Flagyl 500 mg IV q12h   Plan: Vancomycin 1000 mg IV x1 then 750 mg IV q24h ( AUC 503, SCr 1.01 mg/dl, Wt 63.5 kg) ID consult to follow per MD   Height: 5\' 5"  (165.1 cm) Weight: 63.5 kg (140 lb) IBW/kg (Calculated) : 57  Temp (24hrs), Avg:98.3 F (36.8 C), Min:98.2 F (36.8 C), Max:98.3 F (36.8 C)  Recent Labs  Lab 07/24/22 1411 07/24/22 2113 07/25/22 0440 07/26/22 0444 07/27/22 0038 07/28/22 0429 07/29/22 0428  WBC  --   --  18.5* 14.2* 9.5 7.6 8.8  CREATININE  --   --  1.58* 1.27* 1.14* 1.02* 1.01*  LATICACIDVEN 1.5 1.4  --   --   --   --   --     Estimated Creatinine Clearance: 34 mL/min (A) (by C-G formula based on SCr of 1.01 mg/dL (H)).    No Known Allergies  3/19 CTX x 1, resumed 3/20 >>3/22 3/22 Cipro>>  3/19 Zosyn >> 3/20 3/20 Flagyl >>  3/24 vancomycin >>   3/20 surgical PCR: MRSA negative, Staph aureus positive 3/21 bile/surgical:  - Citrobacter freundii ( R to Ceftriaxone, getamicin and Bactrim, S to cefepime, ceftazidime, imipenem, Zosyn)  - E.coli ( R to ampicillin, Unasyn, ciprofloxacin and Bactrim. S to cefepime, ceftriaxone, getamicin, imipenem, Zosyn)  - E. Faecium ( S to ampiciilin, vancomycin)  - Bacterioides vulgatus (beta lactamase positive)   Thank you for allowing pharmacy to be a part of this patient's care.  Royetta Asal, PharmD, BCPS 07/29/2022 1:50 PM   ADDENDUM - per discussion with M, ID recommends Cipro and Zosyn  - ID to see   .Zosyn 3.375 gr IV q8h EI.    Royetta Asal, PharmD, BCPS 07/29/2022 2:34 PM

## 2022-07-30 ENCOUNTER — Inpatient Hospital Stay (HOSPITAL_COMMUNITY): Payer: PPO

## 2022-07-30 DIAGNOSIS — K810 Acute cholecystitis: Secondary | ICD-10-CM | POA: Diagnosis not present

## 2022-07-30 DIAGNOSIS — I5032 Chronic diastolic (congestive) heart failure: Secondary | ICD-10-CM | POA: Diagnosis not present

## 2022-07-30 DIAGNOSIS — R109 Unspecified abdominal pain: Secondary | ICD-10-CM | POA: Diagnosis not present

## 2022-07-30 DIAGNOSIS — K449 Diaphragmatic hernia without obstruction or gangrene: Secondary | ICD-10-CM

## 2022-07-30 DIAGNOSIS — E871 Hypo-osmolality and hyponatremia: Secondary | ICD-10-CM | POA: Diagnosis not present

## 2022-07-30 DIAGNOSIS — K8000 Calculus of gallbladder with acute cholecystitis without obstruction: Secondary | ICD-10-CM

## 2022-07-30 DIAGNOSIS — N179 Acute kidney failure, unspecified: Secondary | ICD-10-CM | POA: Diagnosis not present

## 2022-07-30 LAB — CBC
HCT: 39.1 % (ref 36.0–46.0)
Hemoglobin: 12.9 g/dL (ref 12.0–15.0)
MCH: 31.5 pg (ref 26.0–34.0)
MCHC: 33 g/dL (ref 30.0–36.0)
MCV: 95.4 fL (ref 80.0–100.0)
Platelets: 301 10*3/uL (ref 150–400)
RBC: 4.1 MIL/uL (ref 3.87–5.11)
RDW: 14.3 % (ref 11.5–15.5)
WBC: 10.2 10*3/uL (ref 4.0–10.5)
nRBC: 0 % (ref 0.0–0.2)

## 2022-07-30 LAB — BASIC METABOLIC PANEL
Anion gap: 12 (ref 5–15)
BUN: 28 mg/dL — ABNORMAL HIGH (ref 8–23)
CO2: 25 mmol/L (ref 22–32)
Calcium: 7.5 mg/dL — ABNORMAL LOW (ref 8.9–10.3)
Chloride: 89 mmol/L — ABNORMAL LOW (ref 98–111)
Creatinine, Ser: 0.89 mg/dL (ref 0.44–1.00)
GFR, Estimated: >60 mL/min (ref >60)
Glucose, Bld: 106 mg/dL — ABNORMAL HIGH (ref 70–99)
Potassium: 3.8 mmol/L (ref 3.5–5.1)
Sodium: 126 mmol/L — ABNORMAL LOW (ref 135–145)

## 2022-07-30 NOTE — TOC Progression Note (Signed)
Transition of Care [facility]) - Progression Note    Patient Details  Name: Ellen Chandler MRN: [id number] Date of Birth: 1930/11/16  [facility]) CM/SW Contact  Lennart Pall, LCSW Phone Number: 07/30/2022, 11:43 AM  Clinical Narrative:    Have reviewed SNF bed offers with pt and daugther-in-law and they have accepted bed with [facility].  Per MD, likely to be medically ready by tomorrow.  Insurance authorization received.   Expected Discharge Plan: Skilled Nursing Facility Barriers to Discharge: Continued Medical Work up, Ship broker, SNF Pending bed offer  Expected Discharge Plan and Services In-house Referral: Clinical Social Work   Post Acute Care Choice: [facility] Living arrangements for the past 2 months: Apartment ([place] apt)                 DME Arranged: N/A DME Agency: NA                   Social Determinants of Health (SDOH) Interventions SDOH Screenings   Food Insecurity: No Food Insecurity (07/24/2022)  Housing: Low Risk  (07/24/2022)  Transportation Needs: No Transportation Needs (07/24/2022)  Utilities: Not At Risk (07/24/2022)  Alcohol Screen: Low Risk  (07/10/2022)  Financial Resource Strain: Low Risk  (07/10/2022)  Tobacco Use: Low Risk  (07/24/2022)    Readmission Risk Interventions    07/27/2022    2:36 PM  Readmission Risk Prevention Plan  Transportation Screening Complete  PCP or Specialist Appt within 3-5 Days Complete  HRI or [place] Complete  Social Work Consult for [place] Planning/Counseling Complete  Palliative Care Screening Not Applicable  Medication Review Press photographer) Complete

## 2022-07-30 NOTE — Progress Notes (Signed)
  Progress Note   Patient: Ellen Chandler [id number] DOB: [birth year] DOA: 07/24/2022     5 DOS: the patient was seen and examined on 07/30/2022 at 9:45AM      Brief hospital course: Mrs. Dysinger is an 89 y.o. F with dCHF, recent admit for CHF, HTN, hypothyroidism and pAF on Eliquis who presented with acute right upper quadrant abdominal pain.  In the ER, WBC 23K, LFTs slightly up, CT showed acute cholecystitis with cholelithiasis.     3/19: Admitted, General Surgery consutled, recommended surgery after Eliquis washout 3/20: IR consulted 3/21: Percutaneous drain placed 3/23: Medical work up complete     Assessment and Plan: * Acute cholecystitis Admitted on antibiotics, Gen Surg consulted, not surgical candidate. IR drain placed 3/21, aspirate from drain growing E coli, Citrobacter, E faecium WBC normalized, still with pain. ID consulted, recommended Cipro/Zosyn, and transition to Augmentin/Cipro at discharge - Continue Cipro/Zosyn - At risk for other environment bacterial infection due to drain - Follow up with IR - Follow up with Gen Surg, no follow up with ID needed   AKI (acute kidney injury) ([place]) Resolved with fluids   Hyponatremia Na slightly up.   Appears euvolemic.  Una and Uosm implies combination SIADH and hypovolemia - Hold furosemide - Continue sodium tabs   Hypoxia No respiratory failure.  Mild hypoxia due to hypoventilation (from infradiaphragmatic pain) and atelectasis and hernia.   - IS, pulmonary toilet, mobilization  Malnutrition of moderate degree As evidenced by reduced energy intake for over a week, mild fat depletion, moderate muscle depletion.   Paroxysmal A-fib (HCC) HR controlled - Hold metoprolol - Continue Eliquis and amiodarone   Hypothyroidism - Continue levothyroxine  Chronic diastolic CHF (congestive heart failure) (HCC) Breathing stable.  No clinical CHF - Continue spironolactone  - Hold furosemide  Essential  hypertension BP normal - Hold metoprolol, furosemide - Continue spironolactone          Subjective: Patient still has pain, malaise, generalized weakness.  Her breathing is not comfortable.  She has had no fever, confusion.     Physical Exam: BP 120/76 (BP Location: Left Arm)   Pulse 83   Temp 97.7 F (36.5 C) (Oral)   Resp 16   Ht 5\' 5"  (1.651 m)   Wt 63.5 kg   SpO2 94%   BMI 23.30 kg/m   Frail elderly adult female, lying in bed, tired, responds appropriately to questions RRR, no murmurs, no peripheral edema, no JVD Respiratory effort shallow, somewhat fast, no rales or wheezes, overall diminished Abdomen with tenderness, guarding Responds to questions, somewhat tangential, affect blunted by pain, speech fluent, face symmetric, generalized weakness but symmetric strength    Data Reviewed: Discussed with infectious disease yesterday afternoon, microbiology results reviewed Basic metabolic panel shows slightly improved hyponatremia, stable renal function Blood counts normal     Disposition: Status is: Inpatient Medical workup complete, no safe disposition at this time        Author: Edwin Dada, MD 07/30/2022 10:27 AM  For on call review [email or web address].

## 2022-07-30 NOTE — Progress Notes (Signed)
Physical Therapy Treatment Patient Details Name: Ellen Chandler MRN: [id number] DOB: 1930-09-05 Today's Date: 07/30/2022   History of Present Illness 89 y.o. female with medical history significant of GERD, hypertension, hyperlipidemia, hypothyroidism who presented to the emergency department with right-sided abdominal pain. Dx of acute cholecystitis. Recent admission for respiratory failure 3/4-07/11/22.    PT Comments    Pt requires maximum encouragement to participate. Overall requiring less assist to mobilize however repeatedly asks to get back in bed. Encouraged OOB/time in chair, RN present and reinforced importance. Pt with 3/4 DOE, SpO2=95% on 2L, HR 85.    Recommendations for follow up therapy are one component of a multi-disciplinary discharge planning process, led by the attending physician.  Recommendations may be updated based on patient status, additional functional criteria and insurance authorization.  Follow Up Recommendations  Can patient physically be transported by private vehicle: No    Assistance Recommended at Discharge Frequent or constant Supervision/Assistance  Patient can return home with the following A lot of help with walking and/or transfers;A lot of help with bathing/dressing/bathroom;Assistance with cooking/housework;Assist for transportation;Help with stairs or ramp for entrance   Equipment Recommendations  None recommended by PT    Recommendations for Other Services       Precautions / Restrictions Precautions Precaution Comments: denies falls in past 6 months Restrictions Weight Bearing Restrictions: No     Mobility  Bed Mobility Overal bed mobility: Needs Assistance Bed Mobility: Supine to Sit     Supine to sit: HOB elevated, Mod assist     General bed mobility comments: maximum cues and encouragement to self assist; bed pad used to rotate hips and complete scooting to EOB    Transfers Overall transfer level: Needs  assistance Equipment used: Rolling walker (2 wheels) Transfers: Sit to/from Stand, Bed to chair/wheelchair/BSC Sit to Stand: Min assist   Step pivot transfers: Min assist       General transfer comment: multi-modal cues for participation, sequence, self assist; tactile cues for trunk and hip extension in standing    Ambulation/Gait                   Stairs             Wheelchair Mobility    Modified Rankin (Stroke Patients Only)       Balance     Sitting balance-Leahy Scale: Fair     Standing balance support: During functional activity, Reliant on assistive device for balance, Bilateral upper extremity supported Standing balance-Leahy Scale: Poor                              Cognition Arousal/Alertness: Awake/alert Behavior During Therapy: WFL for tasks assessed/performed Overall Cognitive Status: Within Functional Limits for tasks assessed                                          Exercises General Exercises - Lower Extremity Ankle Circles/Pumps: AROM, 5 reps, Both Long Arc Quad: AROM, Both, 5 reps, Seated Heel Slides: AROM, Both, 5 reps    General Comments        Pertinent Vitals/Pain Pain Assessment Pain Assessment: Faces Faces Pain Scale: Hurts a little bit Pain Location: diffuse, nonspecific Pain Descriptors / Indicators: Grimacing Pain Intervention(s): Limited activity within patient's tolerance, Monitored during session, Premedicated before session, Repositioned, Other (comment), RN gave pain  meds during session    Home Living                          Prior Function            PT Goals (current goals can now be found in the care plan section) Acute Rehab PT Goals PT Goal Formulation: With patient Time For Goal Achievement: 08/09/22 Potential to Achieve Goals: Good Progress towards PT goals: Progressing toward goals    Frequency    Min 2X/week      PT Plan Current plan remains  appropriate    Co-evaluation              AM-PAC PT "6 Clicks" Mobility   Outcome Measure  Help needed turning from your back to your side while in a flat bed without using bedrails?: A Lot Help needed moving from lying on your back to sitting on the side of a flat bed without using bedrails?: A Lot Help needed moving to and from a bed to a chair (including a wheelchair)?: A Lot Help needed standing up from a chair using your arms (e.g., wheelchair or bedside chair)?: A Lot Help needed to walk in hospital room?: A Lot Help needed climbing 3-5 steps with a railing? : Total 6 Click Score: 11    End of Session Equipment Utilized During Treatment: Gait belt;Oxygen Activity Tolerance: Patient limited by fatigue Patient left: in chair;with call bell/phone within reach;with chair alarm set;with nursing/sitter in room Nurse Communication: Mobility status PT Visit Diagnosis: Other abnormalities of gait and mobility (R26.89);Difficulty in walking, not elsewhere classified (R26.2)     Time: [id number] PT Time Calculation (min) (ACUTE ONLY): 17 min  Charges:  $Therapeutic Activity: 8-22 mins                     Baxter Flattery, PT  Acute Rehab Dept [facility]) [phone]  [facility] Weekend Pager [facility] only)  [phone]  07/30/2022    [place] 07/30/2022, 10:39 AM

## 2022-07-30 NOTE — Progress Notes (Signed)
[facility] for Infectious Disease  Date of Admission:  07/24/2022           Reason for visit: Follow up on cholecystitis  Current antibiotics: Cipro Zosyn   ASSESSMENT:    89 y.o. female admitted with:  Sepsis due to calculous cholecystitis: Status post IR guided percutaneous biliary drain 07/26/22 with cultures growing Citrobacter freundii, E coli, E faecium, and Bacteroides. Currently covered with Cipro and Zosyn.  RECOMMENDATIONS:    Continue ciprofloxacin and Zosyn Plan for 7 days of coverage from 3/24 through 3/31 If she discharges prior to that date, could transition to Cipro and Augmentin to finish course noting that her E. coli isolate is intermediate to ampicillin/sulbactam but probably retains some activity Will sign off, please call as needed   Principal Problem:   Acute cholecystitis Active Problems:   Hyponatremia   Essential hypertension   Chronic diastolic CHF (congestive heart failure) (HCC)   Hypothyroidism   AKI (acute kidney injury) ([place])   Paroxysmal A-fib (HCC)   Malnutrition of moderate degree   Hypoxia    MEDICATIONS:    Scheduled Meds:  amiodarone  200 mg Oral Daily   apixaban  5 mg Oral BID   Chlorhexidine Gluconate Cloth  6 each Topical Q0600   feeding supplement  237 mL Oral BID BM   levothyroxine  88 mcg Oral QAC breakfast   multivitamin with minerals  1 tablet Oral Daily   mupirocin ointment  1 Application Nasal BID   pantoprazole  40 mg Oral BID   sodium chloride  1 g Oral BID WC   spironolactone  12.5 mg Oral Daily   Continuous Infusions:  ciprofloxacin 400 mg (07/30/22 0452)   piperacillin-tazobactam (ZOSYN)  IV 3.375 g (07/30/22 0805)   PRN Meds:.acetaminophen **OR** acetaminophen, lip balm, oxyCODONE  SUBJECTIVE:   24 hour events:  No acute events noted Afebrile WBC 10.2 Creatinine downtrending  She reports pain.  No fevers or chills.  She is on 1 L nasal cannula.  Her drain has put out 150 cc since this  morning.  Review of Systems  All other systems reviewed and are negative.     OBJECTIVE:   Blood pressure 120/76, pulse 83, temperature 97.7 F (36.5 C), temperature source Oral, resp. rate 16, height 5\' 5"  (1.651 m), weight 63.5 kg, SpO2 94 %. Body mass index is 23.3 kg/m.  Physical Exam Constitutional:      General: She is not in acute distress.    Appearance: Normal appearance.  HENT:     Head: Normocephalic and atraumatic.  Abdominal:     General: Bowel sounds are normal.     Palpations: Abdomen is soft.     Comments: Percutaneous cholecystostomy tube in place  Skin:    General: Skin is warm and dry.  Neurological:     General: No focal deficit present.     Mental Status: She is alert and oriented to person, place, and time.  Psychiatric:        Mood and Affect: Mood normal.        Behavior: Behavior normal.      Lab Results: Lab Results  Component Value Date   WBC 10.2 07/30/2022   HGB 12.9 07/30/2022   HCT 39.1 07/30/2022   MCV 95.4 07/30/2022   PLT 301 07/30/2022    Lab Results  Component Value Date   NA 126 (L) 07/30/2022   K 3.8 07/30/2022   CO2 25 07/30/2022   GLUCOSE  106 (H) 07/30/2022   BUN 28 (H) 07/30/2022   CREATININE 0.89 07/30/2022   CALCIUM 7.5 (L) 07/30/2022   GFRNONAA >60 07/30/2022   GFRAA >60 09/20/2017    Lab Results  Component Value Date   ALT 25 07/29/2022   AST 19 07/29/2022   ALKPHOS 41 07/29/2022   BILITOT 0.6 07/29/2022    No results found for: "CRP"  No results found for: "ESRSEDRATE"   I have reviewed the micro and lab results in Epic.  Imaging: No results found.   Imaging independently reviewed in Epic.    Raynelle Highland for [facility] [phone] pager 07/30/2022, 10:05 AM

## 2022-07-30 NOTE — Assessment & Plan Note (Signed)
This is truly large.  CT chest last August estimated 50% of stomach was intrathoracic.  CXR here looks about the same as CXR at that time.  Certainly this contributes to her hypoxia, and represents a nontrivial risk of aspiration pneumonia, not possible to modify with surgery or endoscopy given her comorbidities and poor functional status.

## 2022-07-30 NOTE — Progress Notes (Signed)
PHARMACY NOTE -  [facility] has been assisting with dosing of Zosyn for cholecystitis. Dosage remains stable at 3.375 g IV q8 hr and further renal adjustments per institutional Pharmacy antibiotic protocol Also continues on Cipro 400 mg IV q12 hr per MD for C. freundii in bile; dosing appropriate  Pharmacy will sign off, following peripherally for culture results, dose adjustments, and length of therapy. Please reconsult if a change in clinical status warrants re-evaluation of dosage.  Reuel Boom, PharmD, BCPS [phone] 07/30/2022, 9:07 AM

## 2022-07-30 NOTE — Progress Notes (Signed)
Referring Physician(s): Dr. Leighton Ruff   Supervising Physician: Sandi Mariscal  Patient Status:  [facility] - In-pt  Chief Complaint: Acute cholecystitis s/p percutaneous cholecystostomy 07/26/22 by Dr. Anselm Pancoast.   Subjective: Patient in bed resting and watching TV. Ellen Chandler just ordered some soup for dinner. Ellen Chandler states Ellen Chandler is feeling a little bit better and that Ellen Chandler might be discharged to a rehab facility tomorrow.   Allergies: Patient has no known allergies.  Medications: Prior to Admission medications   Medication Sig Start Date End Date Taking? Authorizing Provider  acetaminophen (TYLENOL) 500 MG tablet Take 500-1,000 mg by mouth every 6 (six) hours as needed for moderate pain.   Yes [provider]  amiodarone (PACERONE) 200 MG tablet Take 1 tablet (200 mg total) by mouth daily. Patient taking differently: Take 200 mg by mouth in the morning. 02/14/22  Yes Lyda Jester M, PA-C  amoxicillin (AMOXIL) 500 MG capsule Take 2,000 mg by mouth See admin instructions. Take 2,000 mg by mouth one hour prior to dental appointments   Yes [provider]  apixaban (ELIQUIS) 5 MG TABS tablet Take 1 tablet (5 mg total) by mouth 2 (two) times daily. 02/14/22  Yes Lyda Jester M, PA-C  dapagliflozin propanediol (FARXIGA) 10 MG TABS tablet Take 10 mg by mouth daily.   Yes [provider]  furosemide (LASIX) 20 MG tablet Take 1 tablet (20 mg total) by mouth daily. 07/11/22 08/10/22 Yes Arrien, Jimmy Picket, MD  lactose free nutrition (BOOST) LIQD Take 237 mLs by mouth at bedtime.   Yes [provider]  levothyroxine (SYNTHROID, LEVOTHROID) 88 MCG tablet Take 88 mcg by mouth daily before breakfast.   Yes [provider]  metoprolol succinate (TOPROL XL) 25 MG 24 hr tablet Take 0.5 tablets (12.5 mg total) by mouth daily. 07/20/22  Yes [place]ford, Maricela Bo, FNP  Multiple Vitamins-Minerals (ONE-A-DAY WOMENS 50+ ADVANTAGE) TABS Take 1 tablet by mouth daily with  breakfast.   Yes [provider]  MYRBETRIQ 50 MG TB24 tablet Take 50 mg by mouth in the morning. 10/03/21  Yes [provider]  pantoprazole (PROTONIX) 40 MG tablet Take 40 mg by mouth 2 (two) times daily. 09/18/21  Yes [provider]  polyethylene glycol powder (GLYCOLAX/MIRALAX) 17 GM/SCOOP powder Take 17 g by mouth daily as needed for mild constipation.   Yes [provider]  spironolactone (ALDACTONE) 25 MG tablet Take 0.5 tablets (12.5 mg total) by mouth daily. 07/20/22 08/19/22 Yes Milford, Maricela Bo, FNP     Vital Signs: BP 125/68 (BP Location: Left Arm)   Pulse 77   Temp 97.8 F (36.6 C) (Oral)   Resp 16   Ht 5\' 5"  (1.651 m)   Wt 140 lb (63.5 kg)   SpO2 94%   BMI 23.30 kg/m   Physical Exam Constitutional:      General: Ellen Chandler is not in acute distress.    Appearance: Ellen Chandler is not ill-appearing.  Pulmonary:     Effort: Pulmonary effort is normal.  Abdominal:     Palpations: Abdomen is soft.     Tenderness: There is abdominal tenderness.     Comments: RUQ drain to gravity. Approximately 20 ml of bile in gravity bag. Drain easily flushed with 5 ml NS. Dressing is clean, dry and intact. Site is tender to palpation.   Skin:    General: Skin is warm and dry.  Neurological:     Mental Status: Ellen Chandler is alert and oriented to person, place, and time.  Imaging: DG Chest Port 1 View  Result Date: 07/26/2022 CLINICAL DATA:  Shortness of breath EXAM: PORTABLE CHEST 1 VIEW COMPARISON:  07/24/2022, 06/17/2021, 01/10/2022 FINDINGS: Low lung volumes. Large air distended hiatal hernia. Hazy atelectasis left base. No consolidation, definitive pleural effusion or pneumothorax. Pigtail catheter in the right upper quadrant IMPRESSION: Low lung volumes with hazy atelectasis at the left base. Large air distended hiatal hernia. Electronically Signed   By: Donavan Foil M.D.   On: 07/26/2022 22:07    Labs:  CBC: Recent Labs    07/27/22 0038 07/28/22 0429  07/29/22 0428 07/30/22 0414  WBC 9.5 7.6 8.8 10.2  HGB 12.4 11.8* 12.8 12.9  HCT 38.8 36.3 38.5 39.1  PLT 272 276 292 301    COAGS: Recent Labs    07/25/22 0440 07/26/22 0444  INR 1.8* 1.5*    BMP: Recent Labs    07/27/22 0038 07/28/22 0429 07/29/22 0428 07/30/22 0414  NA 127* 128* 125* 126*  K 4.4 3.4* 3.4* 3.8  CL 96* 93* 90* 89*  CO2 22 26 26 25   GLUCOSE 127* 129* 122* 106*  BUN 30* 30* 30* 28*  CALCIUM 7.9* 7.3* 7.5* 7.5*  CREATININE 1.14* 1.02* 1.01* 0.89  GFRNONAA 46* 53* 53* >60    LIVER FUNCTION TESTS: Recent Labs    07/26/22 0444 07/27/22 0038 07/28/22 0429 07/29/22 0428  BILITOT 0.5 0.4 0.6 0.6  AST 26 28 18 19   ALT 43 38 29 25  ALKPHOS 40 50 43 41  PROT 5.3* 5.8* 5.4* 5.7*  ALBUMIN 2.4* 2.5* 2.2* 2.5*    Assessment and Plan:  Acute cholecystitis s/p percutaneous cholecystostomy 07/26/22  Ellen Chandler is afebrile and without leukocytosis. Possible discharge to rehab facility tomorrow. Outpatient IR orders in place.   Drain Location: RUQ Size: Fr size: 10 Fr Date of placement: 07/26/22 Currently to: Drain collection device: gravity 24 hour output:  Output by Drain (mL) 07/28/22 0700 - 07/28/22 1459 07/28/22 1500 - 07/28/22 2259 07/28/22 2300 - 07/29/22 0659 07/29/22 0700 - 07/29/22 1459 07/29/22 1500 - 07/29/22 2259 07/29/22 2300 - 07/30/22 0659 07/30/22 0700 - 07/30/22 1359  Closed System Drain 1 RUQ Other (Comment) 10 Fr. 100  180 150 25 25 160    Interval imaging/drain manipulation:  none  Current examination: Flushes/aspirates easily.  Insertion site unremarkable. Suture and stat lock in place. Dressed appropriately.   Plan: Continue TID flushes with 5 cc NS. Record output Q shift. Dressing changes QD or PRN if soiled.  Call IR APP or on call IR MD if difficulty flushing or sudden change in drain output.  Repeat imaging/possible drain injection once output < 10 mL/QD (excluding flush material). Consideration for drain removal if output  is < 10 mL/QD (excluding flush material), pending discussion with the providing surgical service.  Discharge planning: Percutaneous cholecystostomy drain to remain in place at least 6-8 weeks pending ongoing surgical evaluation. Recommend fluoroscopy with injection of the drain in IR to evaluate for patency of the cystic duct. An order has been placed for the patient to follow up with IR in 6-8 weeks. A scheduler from our office will call her with a date/time of her appointment.  If the duct is patent and general surgery feels patient is stable for cholecystectomy, the drain would be removed at time of surgery. If the duct is patent and general surgery feels patient is NEVER a candidate for cholecystectomy, drain can be capped for a trial. If symptoms recur, then place to gravity bag again.  If trial is successful, discuss possible removal of the drain.  IR will continue to follow - please call with questions or concerns.  Electronically Signed: Soyla Dryer, AGACNP-BC [phone] 07/30/2022, 1:54 PM   I spent a total of 15 Minutes at the the patient's bedside AND on the patient's hospital floor or unit, greater than 50% of which was counseling/coordinating care for percutaneous cholecystostomy

## 2022-07-31 DIAGNOSIS — K449 Diaphragmatic hernia without obstruction or gangrene: Secondary | ICD-10-CM | POA: Diagnosis not present

## 2022-07-31 DIAGNOSIS — I5033 Acute on chronic diastolic (congestive) heart failure: Secondary | ICD-10-CM | POA: Diagnosis not present

## 2022-07-31 DIAGNOSIS — Y828 Other medical devices associated with adverse incidents: Secondary | ICD-10-CM | POA: Diagnosis not present

## 2022-07-31 DIAGNOSIS — Z79899 Other long term (current) drug therapy: Secondary | ICD-10-CM | POA: Diagnosis not present

## 2022-07-31 DIAGNOSIS — I10 Essential (primary) hypertension: Secondary | ICD-10-CM | POA: Diagnosis not present

## 2022-07-31 DIAGNOSIS — I5032 Chronic diastolic (congestive) heart failure: Secondary | ICD-10-CM | POA: Diagnosis not present

## 2022-07-31 DIAGNOSIS — N179 Acute kidney failure, unspecified: Secondary | ICD-10-CM | POA: Diagnosis not present

## 2022-07-31 DIAGNOSIS — L89326 Pressure-induced deep tissue damage of left buttock: Secondary | ICD-10-CM | POA: Diagnosis not present

## 2022-07-31 DIAGNOSIS — I5043 Acute on chronic combined systolic (congestive) and diastolic (congestive) heart failure: Secondary | ICD-10-CM | POA: Diagnosis not present

## 2022-07-31 DIAGNOSIS — D72829 Elevated white blood cell count, unspecified: Secondary | ICD-10-CM | POA: Diagnosis not present

## 2022-07-31 DIAGNOSIS — R1011 Right upper quadrant pain: Secondary | ICD-10-CM | POA: Diagnosis not present

## 2022-07-31 DIAGNOSIS — T85698A Other mechanical complication of other specified internal prosthetic devices, implants and grafts, initial encounter: Secondary | ICD-10-CM | POA: Diagnosis not present

## 2022-07-31 DIAGNOSIS — R278 Other lack of coordination: Secondary | ICD-10-CM | POA: Diagnosis not present

## 2022-07-31 DIAGNOSIS — E440 Moderate protein-calorie malnutrition: Secondary | ICD-10-CM | POA: Diagnosis not present

## 2022-07-31 DIAGNOSIS — I110 Hypertensive heart disease with heart failure: Secondary | ICD-10-CM | POA: Diagnosis not present

## 2022-07-31 DIAGNOSIS — Z452 Encounter for adjustment and management of vascular access device: Secondary | ICD-10-CM | POA: Diagnosis not present

## 2022-07-31 DIAGNOSIS — T85598A Other mechanical complication of other gastrointestinal prosthetic devices, implants and grafts, initial encounter: Secondary | ICD-10-CM | POA: Diagnosis not present

## 2022-07-31 DIAGNOSIS — E871 Hypo-osmolality and hyponatremia: Secondary | ICD-10-CM | POA: Diagnosis not present

## 2022-07-31 DIAGNOSIS — K810 Acute cholecystitis: Secondary | ICD-10-CM | POA: Diagnosis not present

## 2022-07-31 DIAGNOSIS — R0902 Hypoxemia: Secondary | ICD-10-CM | POA: Diagnosis not present

## 2022-07-31 DIAGNOSIS — Z96652 Presence of left artificial knee joint: Secondary | ICD-10-CM | POA: Diagnosis not present

## 2022-07-31 DIAGNOSIS — R9431 Abnormal electrocardiogram [ECG] [EKG]: Secondary | ICD-10-CM | POA: Diagnosis not present

## 2022-07-31 DIAGNOSIS — T85518A Breakdown (mechanical) of other gastrointestinal prosthetic devices, implants and grafts, initial encounter: Secondary | ICD-10-CM | POA: Diagnosis not present

## 2022-07-31 DIAGNOSIS — K5900 Constipation, unspecified: Secondary | ICD-10-CM | POA: Diagnosis not present

## 2022-07-31 DIAGNOSIS — I509 Heart failure, unspecified: Secondary | ICD-10-CM | POA: Diagnosis not present

## 2022-07-31 DIAGNOSIS — K8000 Calculus of gallbladder with acute cholecystitis without obstruction: Secondary | ICD-10-CM | POA: Diagnosis not present

## 2022-07-31 DIAGNOSIS — D649 Anemia, unspecified: Secondary | ICD-10-CM | POA: Diagnosis not present

## 2022-07-31 DIAGNOSIS — J9601 Acute respiratory failure with hypoxia: Secondary | ICD-10-CM | POA: Diagnosis not present

## 2022-07-31 DIAGNOSIS — E039 Hypothyroidism, unspecified: Secondary | ICD-10-CM | POA: Diagnosis not present

## 2022-07-31 DIAGNOSIS — T85848A Pain due to other internal prosthetic devices, implants and grafts, initial encounter: Secondary | ICD-10-CM | POA: Diagnosis not present

## 2022-07-31 DIAGNOSIS — F1011 Alcohol abuse, in remission: Secondary | ICD-10-CM | POA: Diagnosis not present

## 2022-07-31 DIAGNOSIS — I480 Paroxysmal atrial fibrillation: Secondary | ICD-10-CM | POA: Diagnosis not present

## 2022-07-31 DIAGNOSIS — K219 Gastro-esophageal reflux disease without esophagitis: Secondary | ICD-10-CM | POA: Diagnosis not present

## 2022-07-31 DIAGNOSIS — Z7901 Long term (current) use of anticoagulants: Secondary | ICD-10-CM | POA: Diagnosis not present

## 2022-07-31 DIAGNOSIS — R52 Pain, unspecified: Secondary | ICD-10-CM | POA: Diagnosis not present

## 2022-07-31 DIAGNOSIS — M6281 Muscle weakness (generalized): Secondary | ICD-10-CM | POA: Diagnosis not present

## 2022-07-31 DIAGNOSIS — R2689 Other abnormalities of gait and mobility: Secondary | ICD-10-CM | POA: Diagnosis not present

## 2022-07-31 LAB — CBC
HCT: 38.9 % (ref 36.0–46.0)
Hemoglobin: 12.7 g/dL (ref 12.0–15.0)
MCH: 31.1 pg (ref 26.0–34.0)
MCHC: 32.6 g/dL (ref 30.0–36.0)
MCV: 95.3 fL (ref 80.0–100.0)
Platelets: 323 10*3/uL (ref 150–400)
RBC: 4.08 MIL/uL (ref 3.87–5.11)
RDW: 14.2 % (ref 11.5–15.5)
WBC: 10.4 10*3/uL (ref 4.0–10.5)
nRBC: 0 % (ref 0.0–0.2)

## 2022-07-31 LAB — BASIC METABOLIC PANEL
Anion gap: 8 (ref 5–15)
BUN: 25 mg/dL — ABNORMAL HIGH (ref 8–23)
CO2: 28 mmol/L (ref 22–32)
Calcium: 7.9 mg/dL — ABNORMAL LOW (ref 8.9–10.3)
Chloride: 91 mmol/L — ABNORMAL LOW (ref 98–111)
Creatinine, Ser: 0.89 mg/dL (ref 0.44–1.00)
GFR, Estimated: >60 mL/min (ref >60)
Glucose, Bld: 104 mg/dL — ABNORMAL HIGH (ref 70–99)
Potassium: 4.3 mmol/L (ref 3.5–5.1)
Sodium: 127 mmol/L — ABNORMAL LOW (ref 135–145)

## 2022-07-31 MED ORDER — AMOXICILLIN-POT CLAVULANATE 875-125 MG PO TABS: 1.0000 | ORAL_TABLET | Freq: Two times a day (BID) | ORAL | 0 refills | Status: AC

## 2022-07-31 MED ORDER — OXYCODONE HCL 5 MG PO TABS: 5.0000 mg | ORAL_TABLET | ORAL | 0 refills | Status: DC | PRN

## 2022-07-31 MED ORDER — SODIUM CHLORIDE 1 G PO TABS: 1.0000 g | ORAL_TABLET | Freq: Two times a day (BID) | ORAL | Status: DC

## 2022-07-31 MED ORDER — CIPROFLOXACIN HCL 500 MG PO TABS: 500.0000 mg | ORAL_TABLET | Freq: Two times a day (BID) | ORAL | 0 refills | Status: AC

## 2022-07-31 NOTE — Progress Notes (Signed)
Reviewed written D/C instructions with pt and all questions answered. Pt verbalized understanding. Pt left with all belongings in stable condition with PTAR

## 2022-07-31 NOTE — TOC Transition Note (Signed)
Transition of Care [facility]) - CM/SW Discharge Note   Patient Details  Name: Ellen Chandler MRN: [id number] Date of Birth: 10/08/1930  Transition of Care [facility] CM/SW Contact:  Lennart Pall, LCSW Phone Number: 07/31/2022, 11:10 AM   Clinical Narrative:     Pt is medically cleared to dc today to [facility].  Pt/family aware and agreeable.  PTAR called at 11:05am.  RN to call report to [phone].  No further TOC needs.   Final next level of care: Skilled Nursing Facility Barriers to Discharge: Barriers Resolved   Patient Goals and CMS Choice   Choice offered to / list presented to : Patient, Adult Children  Discharge Placement     Existing PASRR number confirmed : 07/27/22          Patient chooses bed at: [facility] Patient to be transferred to facility by: [facility] Name of family member notified: daughter-in-law Patient and family notified of of transfer: 07/31/22  Discharge Plan and Services Additional resources added to the After Visit Summary for   In-house Referral: Clinical Social Work   Post Acute Care Choice: [place]          DME Arranged: N/A DME Agency: NA                  Social Determinants of Health (SDOH) Interventions SDOH Screenings   Food Insecurity: No Food Insecurity (07/24/2022)  Housing: [place]  (07/24/2022)  Transportation Needs: No Transportation Needs (07/24/2022)  Utilities: Not At Risk (07/24/2022)  Alcohol Screen: Low Risk  (07/10/2022)  Financial Resource Strain: Low Risk  (07/10/2022)  Tobacco Use: Low Risk  (07/24/2022)     Readmission Risk Interventions    07/27/2022    2:36 PM  Readmission Risk Prevention Plan  Transportation Screening Complete  PCP or Specialist Appt within 3-5 Days Complete  HRI or [place] Complete  Social Work Consult for [place] Planning/Counseling Complete  Palliative Care Screening Not Applicable  Medication Review [employer]) Complete

## 2022-07-31 NOTE — Discharge Summary (Signed)
Physician Discharge Summary   Patient: Ellen Chandler MRN: [id number] DOB: 02-20-1931  Admit date:     07/24/2022  Discharge date: 07/31/22  Discharge Physician: Edwin Dada   PCP: Deland Pretty, MD     Recommendations at discharge:  Continue Ciprofloxacin 500 BID for 7 days and Augmentin 875-125 mg BID for 9 days Drain instructions: Flush drain daily with 5-10 mL sterile saline.  Maintain log of daily ouput and bring with you to all follow-up appointments.  Change dressing as needed to keep clean and dry.  Do not submerge the drain in water.  Obtain BMP in 4 days and if Na >130, stop sodium tabs and restart furosemide Ensure patient is upright for all meals Follow up with Interventional Radiology, Dr. Anselm Pancoast (see below) Follow up with [facility] Surgery Dr. Marcello Moores (see below)      Discharge Diagnoses: Principal Problem:   Acute cholecystitis Active Problems:   AKI (acute kidney injury) ([place])   Hyponatremia   Essential hypertension   Chronic diastolic CHF (congestive heart failure) (HCC)   Hypothyroidism   Paroxysmal A-fib (HCC)   Malnutrition of moderate degree   Hypoxia   Hiatal hernia     Hospital Course: Ellen Chandler is an 89 y.o. F with dCHF, recent admit for CHF, HTN, hypothyroidism and pAF on Eliquis who presented with acute right upper quadrant abdominal pain.  In the ER, WBC 23K, LFTs slightly up, CT showed acute cholecystitis with cholelithiasis.       * Acute cholecystitis Admitted on antibiotics, Gen Surg consulted, not surgical candidate. IR drain placed 3/21, aspirate from drain growing E coli, Citrobacter, E faecium WBC normalized, taking PO well.  ID consulted, recommended Cipro/Zosyn, and transition to Augmentin/Cipro at discharge for 7 days.     AKI (acute kidney injury) ([place]) Cr 1.6 on admission, resolved to baseline 0.9 with fluids.      Hyponatremia Urine studies imply combination SIADH and hypovolemia  Started on  sodium tabs and furosemide held and Na increasing.    Repeat Na in 4 days     Hiatal hernia This is truly large.  CT chest last August estimated 50% of stomach was intrathoracic.  CXR here looks about the same as CXR at that time.  Certainly this contributes to her hypoxia, and represents a nontrivial risk of aspiration pneumonia, not possible to modify with surgery or endoscopy given her comorbidities and poor functional status.    Hypoxia No history of respiratory failure.  She had persistent hypoxia here due to  hypoventilation (from infradiaphragmatic pain), atelectasis and hernia.   - IS, pulmonary toilet, mobilization, wean O2 as ablve  Malnutrition of moderate degree As evidenced by reduced energy intake for over a week, mild fat depletion, moderate muscle depletion.   Paroxysmal A-fib (HCC) On Eliquis and amiodarone and metoprolol  Hypothyroidism On levothyroxine  Chronic diastolic CHF (congestive heart failure) ([place]) No clinical CHF here.  Her wheezing is from her hernia.  Essential hypertension             [facility] Controlled Substances Registry was reviewed for this patient prior to discharge.   Consultants: General Surgery, Interventional radiology Procedures performed: Percutaneous gallbladder drain  Disposition: Skilled nursing facility Diet recommendation:  Regular  DISCHARGE MEDICATION: Allergies as of 07/31/2022   No Known Allergies      Medication List     STOP taking these medications    amoxicillin 500 MG capsule Commonly known as: AMOXIL   Farxiga 10  MG Tabs tablet Generic drug: dapagliflozin propanediol   furosemide 20 MG tablet Commonly known as: LASIX   Myrbetriq 50 MG Tb24 tablet Generic drug: mirabegron ER       TAKE these medications    acetaminophen 500 MG tablet Commonly known as: TYLENOL Take 500-1,000 mg by mouth every 6 (six) hours as needed for moderate pain.   amiodarone 200 MG tablet Commonly  known as: PACERONE Take 1 tablet (200 mg total) by mouth daily. What changed: when to take this   amoxicillin-clavulanate 875-125 MG tablet Commonly known as: AUGMENTIN Take 1 tablet by mouth 2 (two) times daily for 9 days.   apixaban 5 MG Tabs tablet Commonly known as: ELIQUIS Take 1 tablet (5 mg total) by mouth 2 (two) times daily.   ciprofloxacin 500 MG tablet Commonly known as: Cipro Take 1 tablet (500 mg total) by mouth 2 (two) times daily for 7 days.   lactose free nutrition Liqd Take 237 mLs by mouth at bedtime.   levothyroxine 88 MCG tablet Commonly known as: SYNTHROID Take 88 mcg by mouth daily before breakfast.   metoprolol succinate 25 MG 24 hr tablet Commonly known as: Toprol XL Take 0.5 tablets (12.5 mg total) by mouth daily.   One-A-Day Womens 50+ Advantage Tabs Take 1 tablet by mouth daily with breakfast.   oxyCODONE 5 MG immediate release tablet Commonly known as: Oxy IR/ROXICODONE Take 1 tablet (5 mg total) by mouth every 4 (four) hours as needed for moderate pain.   pantoprazole 40 MG tablet Commonly known as: PROTONIX Take 40 mg by mouth 2 (two) times daily.   polyethylene glycol powder 17 GM/SCOOP powder Commonly known as: GLYCOLAX/MIRALAX Take 17 g by mouth daily as needed for mild constipation.   sodium chloride 1 g tablet Take 1 tablet (1 g total) by mouth 2 (two) times daily with a meal.   spironolactone 25 MG tablet Commonly known as: ALDACTONE Take 0.5 tablets (12.5 mg total) by mouth daily.               Discharge Care Instructions  (From admission, onward)           Start     Ordered   07/31/22 0000  Discharge wound care:       Comments: As needed   07/31/22 0919   07/27/22 0000  Change dressing (specify)       Comments: Dressing change: as needed to keep site clean and dry   07/27/22 1513            Contact information for follow-up providers     Leighton Ruff, MD. Go on 0000000.   Specialties: General  Surgery, Colon and Rectal Surgery Why: 10:20 AM, please arrive 30 min prior to appointment time. Have ID and insurance information with you. Contact information: [address] [place] [address] [phone]         Markus Daft, MD Follow up.   Specialties: Interventional Radiology, Radiology Why: Schedulers will contact you with date and time of follow-up appointment. Contact information: [place] [address] [phone]              Contact information for after-discharge care     Destination     [facility] .   Service: Skilled Nursing Contact information: [address] Address [place] [phone]  Discharge Instructions     Change dressing (specify)   Complete by: As directed    Dressing change: as needed to keep site clean and dry   Discharge instructions   Complete by: As directed    Flush drain daily with 5-10 mL sterile saline.  Maintain log of daily ouput and bring with you to all follow-up appointments.  Change dressing as needed to keep clean and dry.  Do not submerge the drain in water.  Schedulers will contact you with date and time of follow-up appointment with Interventional Radiology.   Discharge wound care:   Complete by: As directed    As needed   Increase activity slowly   Complete by: As directed        Discharge Exam: Filed Weights   07/24/22 1310  Weight: 63.5 kg    General: Pt is alert, awake, not in acute distress Cardiovascular: RRR, nl S1-S2, no murmurs appreciated.   No LE edema.   Respiratory: Normal respiratory rate and rhythm.  CTAB without rales or wheezes. Abdominal: Abdomen tender but improved, voluntary guarding only.  No distension or HSM.   Neuro/Psych: Strength symmetric in upper and lower extremities.  Judgment and insight appear normal.   Condition at discharge:  stable  The results of significant diagnostics from this hospitalization (including imaging, microbiology, ancillary and laboratory) are listed below for reference.   Imaging Studies: DG Chest 2 View  Result Date: 07/30/2022 CLINICAL DATA:  Abnormal lung sounds EXAM: CHEST - 2 VIEW COMPARISON:  X-ray 07/26/2022 and older.  CT 01/04/2022 FINDINGS: Underinflation. Once again there is an enlarged hiatal hernia. Stable cardiopericardial silhouette with bronchovascular crowding. No pneumothorax, effusion or edema. Calcified aorta. Degenerative changes of the spine with osteopenia. Pigtail catheter along the right upper quadrant of the abdomen. There is stable mild compression of the lower thoracic spine vertebral level. IMPRESSION: Underinflation with bronchovascular crowding. Large hiatal hernia. Electronically Signed   By: Jill Side M.D.   On: 07/30/2022 14:47   DG Chest Port 1 View  Result Date: 07/26/2022 CLINICAL DATA:  Shortness of breath EXAM: PORTABLE CHEST 1 VIEW COMPARISON:  07/24/2022, 06/17/2021, 01/10/2022 FINDINGS: Low lung volumes. Large air distended hiatal hernia. Hazy atelectasis left base. No consolidation, definitive pleural effusion or pneumothorax. Pigtail catheter in the right upper quadrant IMPRESSION: Low lung volumes with hazy atelectasis at the left base. Large air distended hiatal hernia. Electronically Signed   By: Donavan Foil M.D.   On: 07/26/2022 22:07   CT PERC CHOLECYSTOSTOMY  Result Date: 07/26/2022 INDICATION: 89 year old with acute cholecystitis. Patient is not a surgical candidate at this time. EXAM: CT-GUIDED PLACEMENT OF CHOLECYSTOSTOMY TUBE MEDICATIONS: Moderate sedation ANESTHESIA/SEDATION: Moderate (conscious) sedation was employed during this procedure. A total of Versed 1 mg and Fentanyl 50 mcg was administered intravenously by the radiology nurse. Total intra-service moderate Sedation Time: 18 minutes. The patient's level of consciousness and vital signs  were monitored continuously by radiology nursing throughout the procedure under my direct supervision. FLUOROSCOPY: None COMPLICATIONS: None immediate. PROCEDURE: Informed written consent was obtained from the patient after a thorough discussion of the procedural risks, benefits and alternatives. All questions were addressed. Maximal Sterile Barrier Technique was utilized including caps, mask, sterile gowns, sterile gloves, sterile drape, hand hygiene and skin antiseptic. A timeout was performed prior to the initiation of the procedure. Patient was placed supine on CT scanner. Images through the abdomen were obtained. The large abnormal gallbladder was identified with CT. Right side of the abdomen was  prepped with chlorhexidine and sterile field was created. Skin was anesthetized using 1% lidocaine. Small incision was made. Using CT guidance, an 18 gauge trocar needle was directed into the gallbladder and brownish red fluid was aspirated. Superstiff Amplatz wire was advanced into the gallbladder and the tract was dilated to accommodate a 10 [place] multipurpose drain. Approximately 130 mL of fluid was removed. Drain was attached to a gravity bag. Fluid sample was sent for culture. Follow up CT images were obtained. Drain was sutured to skin and a dressing was placed. FINDINGS: The gallbladder was markedly distended with surrounding inflammatory change and large hyperdense stones. Drain was placed within the gallbladder without traversing liver parenchyma. Gallbladder was partially decompressed at the end of the procedure. IMPRESSION: CT-guided placement of a cholecystostomy tube. Fluid was sent for culture. Electronically Signed   By: Markus Daft M.D.   On: 07/26/2022 13:59   CT ABDOMEN PELVIS W CONTRAST  Result Date: 07/24/2022 CLINICAL DATA:  Right lower quadrant pain EXAM: CT ABDOMEN AND PELVIS WITH CONTRAST TECHNIQUE: Multidetector CT imaging of the abdomen and pelvis was performed using the standard protocol  following bolus administration of intravenous contrast. RADIATION DOSE REDUCTION: This exam was performed according to the departmental dose-optimization program which includes automated exposure control, adjustment of the mA and/or kV according to patient size and/or use of iterative reconstruction technique. CONTRAST:  19mL OMNIPAQUE IOHEXOL 300 MG/ML  SOLN COMPARISON:  09/20/2017 FINDINGS: Lower chest: Moderate hiatal hernia involving the fundus and cardia, incompletely visualized, with some adjacent compressive atelectasis medially in both lung bases. No pleural or pericardial effusion. Heart size normal. Scattered ground-glass opacities in the visualized right lower lung. No pleural or pericardial effusion. Hepatobiliary: Marked dilatation of the gallbladder containing stones measured up to 3.1 cm diameter. There is wall thickening and mucosal enhancement with marked surrounding edema and inflammation. There is no intrahepatic biliary ductal dilatation. No focal liver lesion. Pancreas: Unremarkable. No pancreatic ductal dilatation or surrounding inflammatory changes. Spleen: Normal in size without focal abnormality. Adrenals/Urinary Tract: Adrenal glands are unremarkable. Kidneys are normal, without renal calculi, focal lesion, or hydronephrosis. Bladder is physiologically distended. Stomach/Bowel: Large hiatal hernia involving gastric fundus and cardia. Dilated duodenal bulb, with focal narrowing in the proximal duodenum related to the distended inflamed gallbladder. There is gas and fluid distention of proximal small bowel loops. Distal small bowel decompressed, without discrete transition point. There is gaseous distention of the cecum. There is tapered narrowing of the ascending colon related to mass effect from the gallbladder inflammation and distension. The more distal colon is nondilated with a few scattered sigmoid diverticula; no adjacent inflammatory change. Vascular/Lymphatic: Scattered aortoiliac  calcified plaque without aneurysm. Portal vein patent. No abdominal or pelvic adenopathy. Reproductive: Status post hysterectomy. No adnexal masses. Other: No ascites.  No free air. Musculoskeletal: Moderate L1 compression deformity with approximately 20% loss of height anteriorly, and diffuse sclerosis, new since most recent radiograph 06/17/2021. Chronic L2 compression deformity stable. Grade 1 anterolisthesis L5-S1 as before. IMPRESSION: 1. Acute cholecystitis with cholelithiasis. Recommend surgical consultation. 2. Proximal small bowel distention suggesting ileus. 3. Large hiatal hernia. 4. L1 compression deformity, new since 06/17/2021. 5. Sigmoid diverticulosis. 6. Scattered ground-glass opacities in the visualized right lower lung, possibly infectious/inflammatory. Non-contrast chest CT at 3-6 months is recommended. If nodules persist, subsequent management will be based upon the most suspicious nodule(s). This recommendation follows the consensus statement: Guidelines for Management of Incidental Pulmonary Nodules Detected on CT Images:From the Fleischner Society 2017; published online before  print (10.1148/radiol.SG:5268862). 7.  Aortic Atherosclerosis (ICD10-I70.0). Electronically Signed   By: Lucrezia Europe M.D.   On: 07/24/2022 16:07   DG Chest 2 View  Result Date: 07/24/2022 CLINICAL DATA:  Hypoxia, abdominal pain EXAM: CHEST - 2 VIEW COMPARISON:  07/09/2022 FINDINGS: Transverse diameter of heart is increased. There is large fixed hiatal hernia. There is significant interval improvement in aeration in both lungs. Small residual patchy infiltrates are seen in right parahilar region. There are no signs of alveolar pulmonary edema. There is no significant pleural effusion or pneumothorax. IMPRESSION: There is interval clearing of pulmonary vascular congestion. Small residual patchy densities are seen in right parahilar region suggesting residual scarring or subsegmental atelectasis. There is no focal  pulmonary consolidation. Large fixed hiatal hernia. Electronically Signed   By: Elmer Picker M.D.   On: 07/24/2022 14:55   ECHOCARDIOGRAM COMPLETE  Result Date: 07/09/2022    ECHOCARDIOGRAM REPORT   Patient Name:   BLAKLEIGH WAYCHOFF Hubers Date of Exam: 07/09/2022 Medical Rec #:  [id number]         Height:       65.0 in Accession #:    [id number]        Weight:       138.7 lb Date of Birth:  1931/01/12         BSA:          1.693 m Patient Age:    66 years          BP:           111/65 mmHg Patient Gender: F                 HR:           54 bpm. Exam Location:  Inpatient Procedure: 2D Echo, Cardiac Doppler and Color Doppler Indications:    acute systolic chf  History:        Patient has prior history of Echocardiogram examinations, most                 recent 01/04/2022. CHF, Arrythmias:Atrial Fibrillation; Risk                 Factors:Hypertension.  Sonographer:    Johny Chess RDCS Referring Phys: [id number] RATHORE  Sonographer Comments: Image acquisition challenging due to respiratory motion. IMPRESSIONS  1. Left ventricular ejection fraction, by estimation, is 55 to 60%. The left ventricle has normal function. The left ventricle has no regional wall motion abnormalities. There is mild left ventricular hypertrophy. Left ventricular diastolic parameters are consistent with Grade II diastolic dysfunction (pseudonormalization).  2. Right ventricular systolic function is normal. The right ventricular size is normal. There is normal pulmonary artery systolic pressure.  3. Left atrial size was severely dilated.  4. Mild mitral valve regurgitation.  5. The aortic valve is tricuspid. Aortic valve regurgitation is mild. Comparison(s): The left ventricular function has improved. FINDINGS  Left Ventricle: Left ventricular ejection fraction, by estimation, is 55 to 60%. The left ventricle has normal function. The left ventricle has no regional wall motion abnormalities. The left ventricular internal cavity size  was normal in size. There is  mild left ventricular hypertrophy. Left ventricular diastolic parameters are consistent with Grade II diastolic dysfunction (pseudonormalization). Right Ventricle: The right ventricular size is normal. Right vetricular wall thickness was not assessed. Right ventricular systolic function is normal. There is normal pulmonary artery systolic pressure. The tricuspid regurgitant velocity is 2.44 m/s, and with an assumed right atrial pressure of  3 mmHg, the estimated right ventricular systolic pressure is 0000000 mmHg. Left Atrium: Left atrial size was severely dilated. Right Atrium: Right atrial size was normal in size. Pericardium: There is no evidence of pericardial effusion. Mitral Valve: There is mild thickening of the mitral valve leaflet(s). There is mild calcification of the mitral valve leaflet(s). Mild mitral valve regurgitation. Tricuspid Valve: The tricuspid valve is normal in structure. Tricuspid valve regurgitation is trivial. Aortic Valve: The aortic valve is tricuspid. Aortic valve regurgitation is mild. Pulmonic Valve: The pulmonic valve was normal in structure. Pulmonic valve regurgitation is not visualized. Aorta: The aortic root and ascending aorta are structurally normal, with no evidence of dilitation. IAS/Shunts: No atrial level shunt detected by color flow Doppler.  LEFT VENTRICLE PLAX 2D LVIDd:         4.20 cm   Diastology LVIDs:         2.90 cm   LV e' medial:    4.90 cm/s LV PW:         1.00 cm   LV E/e' medial:  19.9 LV IVS:        1.20 cm   LV e' lateral:   7.94 cm/s LVOT diam:     1.80 cm   LV E/e' lateral: 12.3 LV SV:         41 LV SV Index:   24 LVOT Area:     2.54 cm  RIGHT VENTRICLE             IVC RV Basal diam:  2.60 cm     IVC diam: 1.40 cm RV S prime:     10.30 cm/s TAPSE (M-mode): 2.6 cm LEFT ATRIUM              Index        RIGHT ATRIUM           Index LA diam:        4.00 cm  2.36 cm/m   RA Area:     12.40 cm LA Vol (A2C):   52.0 ml  30.71 ml/m  RA  Volume:   24.70 ml  14.59 ml/m LA Vol (A4C):   110.0 ml 64.97 ml/m LA Biplane Vol: 75.9 ml  44.83 ml/m  AORTIC VALVE LVOT Vmax:   89.00 cm/s LVOT Vmean:  54.800 cm/s LVOT VTI:    0.163 m  AORTA Ao Root diam: 3.30 cm Ao Asc diam:  3.30 cm MITRAL VALVE               TRICUSPID VALVE MV Area (PHT): 3.31 cm    TR Peak grad:   23.8 mmHg MV Decel Time: 229 msec    TR Vmax:        244.00 cm/s MV E velocity: 97.70 cm/s MV A velocity: 87.00 cm/s  SHUNTS MV E/A ratio:  1.12        Systemic VTI:  0.16 m                            Systemic Diam: 1.80 cm Dorris Carnes MD Electronically signed by Dorris Carnes MD Signature Date/Time: 07/09/2022/6:03:45 PM    Final    DG Chest Portable 1 View  Result Date: 07/09/2022 CLINICAL DATA:  Shortness of breath. EXAM: PORTABLE CHEST 1 VIEW COMPARISON:  02/03/2022. FINDINGS: The heart is enlarged and the mediastinal contour is stable. There is a large hiatal hernia. Pulmonary vascular congestion is present. Atherosclerotic calcification of the  aorta is noted. Patchy perihilar airspace disease is noted bilaterally. No effusion or pneumothorax. No acute osseous abnormality. IMPRESSION: 1. Cardiomegaly with pulmonary vascular congestion. 2. Patchy perihilar airspace disease bilaterally suggesting edema. Correlate clinically to exclude infection. 3. Large hiatal hernia. Electronically Signed   By: Brett Fairy M.D.   On: 07/09/2022 02:16    Microbiology: Results for orders placed or performed during the hospital encounter of 07/24/22  Surgical pcr screen     Status: Abnormal   Collection Time: 07/25/22  8:52 PM   Specimen: Nasal Mucosa; Nasal Swab  Result Value Ref Range Status   MRSA, PCR NEGATIVE NEGATIVE Final   Staphylococcus aureus POSITIVE (A) NEGATIVE Final    Comment: (NOTE) The Xpert SA Assay (FDA approved for NASAL specimens in patients 14 years of age and older), is one component of a comprehensive surveillance program. It is not intended to diagnose infection nor  to guide or monitor treatment. Performed at [facility], [place] [address] [place], [place] [address]   Aerobic/Anaerobic Culture w Gram Stain (surgical/deep wound)     Status: None   Collection Time: 07/26/22 12:29 PM   Specimen: BILE  Result Value Ref Range Status   Specimen Description   Final    BILE Performed at [place] [address] [place], [place] [address]    Special Requests   Final    NONE Performed at [facility], [place] [address] [place], [place] [address]    Gram Stain   Final    NO WBC SEEN FEW GRAM POSITIVE RODS RARE GRAM POSITIVE COCCI IN CHAINS RARE GRAM NEGATIVE RODS    Culture   Final    ABUNDANT CITROBACTER FREUNDII ABUNDANT ESCHERICHIA COLI ABUNDANT ENTEROCOCCUS FAECIUM ABUNDANT BACTEROIDES VULGATUS BETA LACTAMASE POSITIVE Performed at [facility], [place] [address] [place], [place] [address]    Report Status 07/29/2022 FINAL  Final   Organism ID, Bacteria CITROBACTER FREUNDII  Final   Organism ID, Bacteria ESCHERICHIA COLI  Final   Organism ID, Bacteria ENTEROCOCCUS FAECIUM  Final      Susceptibility   Citrobacter freundii - MIC*    CEFEPIME 2 SENSITIVE Sensitive     CEFTAZIDIME 4 SENSITIVE Sensitive     CEFTRIAXONE >=64 RESISTANT Resistant     CIPROFLOXACIN <=0.25 SENSITIVE Sensitive     GENTAMICIN >=16 RESISTANT Resistant     IMIPENEM <=0.25 SENSITIVE Sensitive     TRIMETH/SULFA >=320 RESISTANT Resistant     PIP/TAZO <=4 SENSITIVE Sensitive     * ABUNDANT CITROBACTER FREUNDII   Escherichia coli - MIC*    AMPICILLIN >=32 RESISTANT Resistant     CEFEPIME <=0.12 SENSITIVE Sensitive     CEFTAZIDIME <=1 SENSITIVE Sensitive     CEFTRIAXONE <=0.25 SENSITIVE Sensitive     CIPROFLOXACIN >=4 RESISTANT Resistant     GENTAMICIN <=1 SENSITIVE Sensitive     IMIPENEM <=0.25 SENSITIVE Sensitive     TRIMETH/SULFA >=320 RESISTANT Resistant     AMPICILLIN/SULBACTAM 16 INTERMEDIATE  Intermediate     PIP/TAZO <=4 SENSITIVE Sensitive     * ABUNDANT ESCHERICHIA COLI   Enterococcus faecium - MIC*    AMPICILLIN <=2 SENSITIVE Sensitive     VANCOMYCIN <=0.5 SENSITIVE Sensitive     GENTAMICIN SYNERGY SENSITIVE Sensitive     * ABUNDANT ENTEROCOCCUS FAECIUM    Labs: CBC: Recent Labs  Lab 07/24/22 1325 07/25/22 0440 07/26/22 0444 07/27/22 0038 07/28/22 0429 07/29/22 0428 07/30/22 0414 07/31/22 0445  WBC 23.2*   < >  14.2* 9.5 7.6 8.8 10.2 10.4  NEUTROABS 19.4*  --  11.8*  --   --   --   --   --   HGB 16.2*   < > 12.2 12.4 11.8* 12.8 12.9 12.7  HCT 47.8*   < > 36.9 38.8 36.3 38.5 39.1 38.9  MCV 94.5   < > 96.1 99.0 95.8 95.5 95.4 95.3  PLT 402*   < > 268 272 276 292 301 323   < > = values in this interval not displayed.   Basic Metabolic Panel: Recent Labs  Lab 07/27/22 0038 07/28/22 0429 07/29/22 0428 07/30/22 0414 07/31/22 0445  NA 127* 128* 125* 126* 127*  K 4.4 3.4* 3.4* 3.8 4.3  CL 96* 93* 90* 89* 91*  CO2 22 26 26 25 28   GLUCOSE 127* 129* 122* 106* 104*  BUN 30* 30* 30* 28* 25*  CREATININE 1.14* 1.02* 1.01* 0.89 0.89  CALCIUM 7.9* 7.3* 7.5* 7.5* 7.9*   Liver Function Tests: Recent Labs  Lab 07/25/22 0440 07/26/22 0444 07/27/22 0038 07/28/22 0429 07/29/22 0428  AST 51* 26 28 18 19   ALT 59* 43 38 29 25  ALKPHOS 37* 40 50 43 41  BILITOT 0.6 0.5 0.4 0.6 0.6  PROT 6.1* 5.3* 5.8* 5.4* 5.7*  ALBUMIN 2.8* 2.4* 2.5* 2.2* 2.5*   CBG: No results for input(s): "GLUCAP" in the last 168 hours.  Discharge time spent: approximately 35 minutes spent on discharge counseling, evaluation of patient on day of discharge, and coordination of discharge planning with nursing, social work, pharmacy and case management  Signed: Edwin Dada, MD [facility] 07/31/2022

## 2022-07-31 NOTE — Care Management Important Message (Signed)
Important Message  Patient Details IM Letter given Name: JANAT SAUNDERSON MRN: [id number] Date of Birth: 06/21/1930   Medicare Important Message Given:  Yes     Kerin Salen 07/31/2022, 11:02 AM

## 2022-07-31 NOTE — Plan of Care (Signed)

## 2022-08-09 DIAGNOSIS — I10 Essential (primary) hypertension: Secondary | ICD-10-CM | POA: Diagnosis not present

## 2022-08-09 DIAGNOSIS — D649 Anemia, unspecified: Secondary | ICD-10-CM | POA: Diagnosis not present

## 2022-08-10 DIAGNOSIS — E871 Hypo-osmolality and hyponatremia: Secondary | ICD-10-CM | POA: Diagnosis not present

## 2022-08-10 DIAGNOSIS — I509 Heart failure, unspecified: Secondary | ICD-10-CM | POA: Diagnosis not present

## 2022-08-13 ENCOUNTER — Other Ambulatory Visit: Payer: Self-pay | Admitting: *Deleted

## 2022-08-13 DIAGNOSIS — Z79899 Other long term (current) drug therapy: Secondary | ICD-10-CM

## 2022-08-13 DIAGNOSIS — I5042 Chronic combined systolic (congestive) and diastolic (congestive) heart failure: Secondary | ICD-10-CM

## 2022-08-13 MED ORDER — METOPROLOL SUCCINATE ER 25 MG PO TB24: 12.5000 mg | ORAL_TABLET | Freq: Every day | ORAL | 3 refills | Status: DC

## 2022-08-14 ENCOUNTER — Telehealth (HOSPITAL_COMMUNITY): Payer: Self-pay | Admitting: Vascular Surgery

## 2022-08-14 NOTE — Telephone Encounter (Signed)
Pt d.IL called to cancel appt , pt is now in a facility after surg they will call back to resch 4/10 appt

## 2022-08-15 ENCOUNTER — Encounter (HOSPITAL_COMMUNITY): Payer: PPO

## 2022-08-16 DIAGNOSIS — E440 Moderate protein-calorie malnutrition: Secondary | ICD-10-CM | POA: Diagnosis not present

## 2022-08-16 DIAGNOSIS — E039 Hypothyroidism, unspecified: Secondary | ICD-10-CM | POA: Diagnosis not present

## 2022-08-16 DIAGNOSIS — L89326 Pressure-induced deep tissue damage of left buttock: Secondary | ICD-10-CM | POA: Diagnosis not present

## 2022-08-16 DIAGNOSIS — I480 Paroxysmal atrial fibrillation: Secondary | ICD-10-CM | POA: Diagnosis not present

## 2022-08-21 DIAGNOSIS — K219 Gastro-esophageal reflux disease without esophagitis: Secondary | ICD-10-CM | POA: Diagnosis not present

## 2022-08-21 DIAGNOSIS — K810 Acute cholecystitis: Secondary | ICD-10-CM | POA: Diagnosis not present

## 2022-08-21 DIAGNOSIS — K8000 Calculus of gallbladder with acute cholecystitis without obstruction: Secondary | ICD-10-CM | POA: Diagnosis not present

## 2022-08-21 DIAGNOSIS — K5900 Constipation, unspecified: Secondary | ICD-10-CM | POA: Diagnosis not present

## 2022-08-23 ENCOUNTER — Observation Stay (HOSPITAL_COMMUNITY)
Admission: EM | Admit: 2022-08-23 | Discharge: 2022-08-29 | Disposition: A | Discharge status: Skilled Nursing Facility | Payer: PPO | Attending: Internal Medicine | Admitting: Internal Medicine

## 2022-08-23 ENCOUNTER — Emergency Department (HOSPITAL_COMMUNITY): Payer: PPO

## 2022-08-23 ENCOUNTER — Other Ambulatory Visit: Payer: Self-pay

## 2022-08-23 ENCOUNTER — Encounter (HOSPITAL_COMMUNITY): Payer: Self-pay | Admitting: Student

## 2022-08-23 DIAGNOSIS — Z452 Encounter for adjustment and management of vascular access device: Secondary | ICD-10-CM | POA: Diagnosis not present

## 2022-08-23 DIAGNOSIS — E039 Hypothyroidism, unspecified: Secondary | ICD-10-CM | POA: Diagnosis not present

## 2022-08-23 DIAGNOSIS — E871 Hypo-osmolality and hyponatremia: Secondary | ICD-10-CM | POA: Insufficient documentation

## 2022-08-23 DIAGNOSIS — Z7901 Long term (current) use of anticoagulants: Secondary | ICD-10-CM | POA: Diagnosis not present

## 2022-08-23 DIAGNOSIS — I10 Essential (primary) hypertension: Secondary | ICD-10-CM | POA: Diagnosis present

## 2022-08-23 DIAGNOSIS — I110 Hypertensive heart disease with heart failure: Secondary | ICD-10-CM | POA: Diagnosis not present

## 2022-08-23 DIAGNOSIS — T85598A Other mechanical complication of other gastrointestinal prosthetic devices, implants and grafts, initial encounter: Secondary | ICD-10-CM | POA: Diagnosis not present

## 2022-08-23 DIAGNOSIS — D72829 Elevated white blood cell count, unspecified: Secondary | ICD-10-CM | POA: Diagnosis not present

## 2022-08-23 DIAGNOSIS — I5032 Chronic diastolic (congestive) heart failure: Secondary | ICD-10-CM | POA: Insufficient documentation

## 2022-08-23 DIAGNOSIS — Z79899 Other long term (current) drug therapy: Secondary | ICD-10-CM | POA: Diagnosis not present

## 2022-08-23 DIAGNOSIS — R1011 Right upper quadrant pain: Secondary | ICD-10-CM | POA: Diagnosis not present

## 2022-08-23 DIAGNOSIS — I480 Paroxysmal atrial fibrillation: Secondary | ICD-10-CM | POA: Insufficient documentation

## 2022-08-23 DIAGNOSIS — Z96652 Presence of left artificial knee joint: Secondary | ICD-10-CM | POA: Insufficient documentation

## 2022-08-23 DIAGNOSIS — L89326 Pressure-induced deep tissue damage of left buttock: Secondary | ICD-10-CM | POA: Diagnosis not present

## 2022-08-23 DIAGNOSIS — Y828 Other medical devices associated with adverse incidents: Secondary | ICD-10-CM | POA: Insufficient documentation

## 2022-08-23 DIAGNOSIS — T85848A Pain due to other internal prosthetic devices, implants and grafts, initial encounter: Secondary | ICD-10-CM | POA: Diagnosis not present

## 2022-08-23 DIAGNOSIS — K810 Acute cholecystitis: Secondary | ICD-10-CM

## 2022-08-23 DIAGNOSIS — T85698A Other mechanical complication of other specified internal prosthetic devices, implants and grafts, initial encounter: Secondary | ICD-10-CM | POA: Diagnosis not present

## 2022-08-23 DIAGNOSIS — T8579XA Infection and inflammatory reaction due to other internal prosthetic devices, implants and grafts, initial encounter: Secondary | ICD-10-CM

## 2022-08-23 DIAGNOSIS — T85518A Breakdown (mechanical) of other gastrointestinal prosthetic devices, implants and grafts, initial encounter: Secondary | ICD-10-CM | POA: Diagnosis present

## 2022-08-23 DIAGNOSIS — E440 Moderate protein-calorie malnutrition: Secondary | ICD-10-CM | POA: Diagnosis not present

## 2022-08-23 LAB — CBC WITH DIFFERENTIAL/PLATELET
Abs Immature Granulocytes: 0.67 10*3/uL — ABNORMAL HIGH (ref 0.00–0.07)
Basophils Absolute: 0.1 10*3/uL (ref 0.0–0.1)
Basophils Relative: 1 %
Eosinophils Absolute: 0.2 10*3/uL (ref 0.0–0.5)
Eosinophils Relative: 2 %
HCT: 37.4 % (ref 36.0–46.0)
Hemoglobin: 12.4 g/dL (ref 12.0–15.0)
Immature Granulocytes: 5 %
Lymphocytes Relative: 18 %
Lymphs Abs: 2.3 10*3/uL (ref 0.7–4.0)
MCH: 31.1 pg (ref 26.0–34.0)
MCHC: 33.2 g/dL (ref 30.0–36.0)
MCV: 93.7 fL (ref 80.0–100.0)
Monocytes Absolute: 1.4 10*3/uL — ABNORMAL HIGH (ref 0.1–1.0)
Monocytes Relative: 11 %
Neutro Abs: 8 10*3/uL — ABNORMAL HIGH (ref 1.7–7.7)
Neutrophils Relative %: 63 %
Platelets: 397 10*3/uL (ref 150–400)
RBC: 3.99 MIL/uL (ref 3.87–5.11)
RDW: 13.6 % (ref 11.5–15.5)
WBC: 12.7 10*3/uL — ABNORMAL HIGH (ref 4.0–10.5)
nRBC: 0 % (ref 0.0–0.2)

## 2022-08-23 LAB — COMPREHENSIVE METABOLIC PANEL
ALT: 27 U/L (ref 0–44)
AST: 23 U/L (ref 15–41)
Albumin: <1.5 g/dL — ABNORMAL LOW (ref 3.5–5.0)
Alkaline Phosphatase: 40 U/L (ref 38–126)
Anion gap: 5 (ref 5–15)
BUN: 14 mg/dL (ref 8–23)
CO2: 25 mmol/L (ref 22–32)
Calcium: 5.7 mg/dL — CL (ref 8.9–10.3)
Chloride: 105 mmol/L (ref 98–111)
Creatinine, Ser: 0.6 mg/dL (ref 0.44–1.00)
GFR, Estimated: >60 mL/min (ref >60)
Glucose, Bld: 73 mg/dL (ref 70–99)
Potassium: 2.7 mmol/L — CL (ref 3.5–5.1)
Sodium: 135 mmol/L (ref 135–145)
Total Bilirubin: 0.2 mg/dL — ABNORMAL LOW (ref 0.3–1.2)
Total Protein: 3.7 g/dL — ABNORMAL LOW (ref 6.5–8.1)

## 2022-08-23 LAB — BASIC METABOLIC PANEL
Anion gap: 12 (ref 5–15)
BUN: 18 mg/dL (ref 8–23)
CO2: 29 mmol/L (ref 22–32)
Calcium: 8.4 mg/dL — ABNORMAL LOW (ref 8.9–10.3)
Chloride: 91 mmol/L — ABNORMAL LOW (ref 98–111)
Creatinine, Ser: 0.84 mg/dL (ref 0.44–1.00)
GFR, Estimated: >60 mL/min (ref >60)
Glucose, Bld: 94 mg/dL (ref 70–99)
Potassium: 4.1 mmol/L (ref 3.5–5.1)
Sodium: 132 mmol/L — ABNORMAL LOW (ref 135–145)

## 2022-08-23 NOTE — ED Notes (Signed)
Pt provided ice chips.

## 2022-08-23 NOTE — ED Notes (Signed)
Pt clean from BM. Warm, wet wash cloth, new diaper.

## 2022-08-23 NOTE — ED Triage Notes (Signed)
Pt coming from SNF via EMS for a dislodged biliary drain on right side. Pt is a&ox4, pwd. Pt complains of discomfort to site when touched. No other complaints. Pt denies cp/shob

## 2022-08-23 NOTE — ED Notes (Signed)
Pt cleaned from BM. Wiped with warm, wet washcloth. New diaper.

## 2022-08-23 NOTE — ED Provider Notes (Signed)
Ellen Chandler   CSN: [id number] Arrival date & time: 08/23/22  1543     History  Chief Complaint  Patient presents with   Abdominal Pain    RUQ pain, cholecystotomy drain in place, decreased drainage     Ellen Chandler is a 89 y.o. female.  Ellen Chandler is a 89 y.o. female with a history of cholecystitis s/p cholecystostomy, presenting from skilled nursing facility via EMS with concern for possible displacement of the drain tube and increasing right upper quadrant pain.  Patient reports that over the past 3 days she has had decreased drainage from the tube and is started to have some increasing pain.  She reports she has noticed some redness over the site and drainage.  She reports significant discomfort anytime the site is touched.  No nausea or vomiting.  Denies chest pain or shortness of breath.  No known fevers.  Drain placed by IR.  The history is provided by the patient, a relative and medical records.       Home Medications Prior to Admission medications   Medication Sig Start Date End Date Taking? Authorizing Provider  acetaminophen (TYLENOL) 500 MG tablet Take 500-1,000 mg by mouth every 6 (six) hours as needed for moderate pain.    [provider]  amiodarone (PACERONE) 200 MG tablet Take 1 tablet (200 mg total) by mouth daily. Patient taking differently: Take 200 mg by mouth in the morning. 02/14/22   Robbie Lis M, PA-C  apixaban (ELIQUIS) 5 MG TABS tablet Take 1 tablet (5 mg total) by mouth 2 (two) times daily. 02/14/22   Robbie Lis M, PA-C  lactose free nutrition (BOOST) LIQD Take 237 mLs by mouth at bedtime.    [provider]  levothyroxine (SYNTHROID, LEVOTHROID) 88 MCG tablet Take 88 mcg by mouth daily before breakfast.    [provider]  metoprolol succinate (TOPROL XL) 25 MG 24 hr tablet Take 0.5 tablets (12.5 mg total) by mouth daily. 08/13/22   Milford,  Anderson Malta, FNP  Multiple Vitamins-Minerals (ONE-A-DAY WOMENS 50+ ADVANTAGE) TABS Take 1 tablet by mouth daily with breakfast.    [provider]  oxyCODONE (OXY IR/ROXICODONE) 5 MG immediate release tablet Take 1 tablet (5 mg total) by mouth every 4 (four) hours as needed for moderate pain. 07/31/22   Danford, Earl Lites, MD  pantoprazole (PROTONIX) 40 MG tablet Take 40 mg by mouth 2 (two) times daily. 09/18/21   [provider]  polyethylene glycol powder (GLYCOLAX/MIRALAX) 17 GM/SCOOP powder Take 17 g by mouth daily as needed for mild constipation.    [provider]  sodium chloride 1 g tablet Take 1 tablet (1 g total) by mouth 2 (two) times daily with a meal. 07/31/22   Danford, Earl Lites, MD  spironolactone (ALDACTONE) 25 MG tablet Take 0.5 tablets (12.5 mg total) by mouth daily. 07/20/22 08/19/22  Jacklynn Ganong, FNP      Allergies    Patient has no known allergies.    Review of Systems   Review of Systems  Constitutional:  Negative for chills and fever.  Respiratory:  Negative for cough and shortness of breath.   Cardiovascular:  Negative for chest pain.  Gastrointestinal:  Positive for abdominal pain. Negative for nausea and vomiting.  All other systems reviewed and are negative.   Physical Exam Updated Vital Signs BP (!) 138/46 (BP Location: Right Arm)   Pulse 60   Temp 98.2  F (36.8 C) (Oral)   Resp 14   Ht 5\' 5"  (1.651 m)   Wt 63 kg   SpO2 98%   BMI 23.13 kg/m  Physical Exam Vitals and nursing Chandler reviewed.  Constitutional:      General: She is not in acute distress.    Appearance: Normal appearance. She is well-developed. She is not ill-appearing or diaphoretic.  HENT:     Head: Normocephalic and atraumatic.  Eyes:     General:        Right eye: No discharge.        Left eye: No discharge.  Cardiovascular:     Rate and Rhythm: Normal rate and regular rhythm.     Heart sounds: Normal heart sounds.  Pulmonary:     Effort:  Pulmonary effort is normal. No respiratory distress.     Breath sounds: Normal breath sounds.  Abdominal:     Comments: Tenderness to light palpation in the right upper quadrant, cholecystostomy drain present with suture intact, there is some redness and a small amount of purulent drainage leaking from around the tube.  Small amount of thick brown drainage noted in drain bag.  Musculoskeletal:        General: No deformity.  Skin:    General: Skin is warm and dry.  Neurological:     Mental Status: She is alert and oriented to person, place, and time.     Coordination: Coordination normal.  Psychiatric:        Mood and Affect: Mood normal.        Behavior: Behavior normal.     ED Results / Procedures / Treatments   Labs (all labs ordered are listed, but only abnormal results are displayed) Labs Reviewed  CBC WITH DIFFERENTIAL/PLATELET - Abnormal; Notable for the following components:      Result Value   WBC 12.7 (*)    Neutro Abs 8.0 (*)    Monocytes Absolute 1.4 (*)    Abs Immature Granulocytes 0.67 (*)    All other components within normal limits  COMPREHENSIVE METABOLIC PANEL    EKG None  Radiology DG Abd Portable 1 View  Result Date: 08/23/2022 CLINICAL DATA:  Drain placement EXAM: PORTABLE ABDOMEN - 1 VIEW COMPARISON:  X-ray 09/20/2017. Drain placement CT procedure 07/26/2022 FINDINGS: Gas is seen in nondilated loops of small and large bowel. Scattered colonic stool. The diaphragm in the left hemi abdominal edge are clipped off the edge of the film. The film is rotated to the left. Pigtail catheter overlies the right upper quadrant of the abdomen. This very well could be in the gallbladder. However evaluation is limited by x-ray and if needed a CT or ultrasound could be considered IMPRESSION: Nonspecific bowel gas pattern. Pigtail catheter in the right upper quadrant could be within the gallbladder but indeterminate on this 1 portable view x-ray. If needed confirmatory  ultrasound or CT could be considered Electronically Signed   By: Karen Kays M.D.   On: 08/23/2022 16:54    Procedures Procedures    Medications Ordered in ED Medications - No data to display  ED Course/ Medical Decision Making/ A&P                             Medical Decision Making   89 year old female presents with right upper quadrant pain and concern for displacement of cholecystostomy drain.  Drain was placed by interventional radiology.  She reports decreased drainage and  increased pain over the past 3 days.  On arrival she is afebrile, vital stable.  Additional history obtained from a review of records from recent hospitalization as well as speaking with daughter at bedside.  Laboratory interpretation significant for mild leukocytosis, CMP is pending.  Case discussed with interventional radiology, they recommend getting 1 view abdominal x-ray to compare with placement with recent chest x-ray.  I independently viewed and interpreted imaging, pigtail catheter is noted in the right upper quadrant, appears to be in similar location compared to recent chest x-ray, radiologist read suggest placement within the gallbladder is indeterminate.  PA with interventional radiology seen and evaluated patient.  Tube was able to flush and draw back without difficulty.  Given the surrounding erythema and increased pain they will plan to do IR exchange tomorrow.  Request medicine admission.  Care signed out to Dr. Theresia Lo who will follow-up on pending CMP and call for admission.  Discussed plan with daughter.  She request social work consult to assist with placement solutions as patient's time at current rehab has exceeded insurance coverage and she cannot return to independent living.        Final Clinical Impression(s) / ED Diagnoses Final diagnoses:  Infection of cholecystostomy drain, initial encounter  RUQ pain    Rx / DC Orders ED Discharge Orders     None         Dartha Lodge, PA-C 08/23/22 1939    Rexford Maus, DO 08/23/22 2353

## 2022-08-23 NOTE — ED Notes (Signed)
Pt placed on 2 lpm for O2 sats in the high 80's. O2 resulted in increase in O2 sat 96%

## 2022-08-23 NOTE — Consult Note (Signed)
Chief Complaint: Patient was seen in consultation today for pain at drain site  Referring Physician(s): Dr. Theresia Lo  Supervising Physician: Gilmer Mor  Patient Status: [facility] - ED  History of Present Illness: Ellen Chandler is a 89 y.o. female with past medical history of HTN, GERD, recent acute cholecystitis s/p percutaneous cholecystostomy tube placement 07/29/22. She returns to ED today with complaint of site pain.   Upon assessment, patient found resting comfortably in bed.  She has exquisite pain around her drain site.  There is erythema without excoriations or deterioration in the drain site.  She has murky, thick bilious drainage with large amount of debris in her gravity bag.  There is a small amount of drainage on the dressing which appears most consistent with output.   Reports she has been eating and drinking well.  Denies nausea, vomiting, or abdominal pain at rest.  Reports tenderness at the drain site and in the RUQ with movement.   Past Medical History:  Diagnosis Date   Abnormal ECG    a. Pt aware of long hx of abnl ecg->h/o normal stress testing in [place] ~ 2000.   Diastolic dysfunction    a. 04/2013 Echo: EF 60-65%, No rwma, Gr 1 DD, mild MR.   GERD (gastroesophageal reflux disease)    Hypertension    Hypothyroidism     Past Surgical History:  Procedure Laterality Date   ABDOMINAL HYSTERECTOMY     JOINT REPLACEMENT     a. knee left - injury resulted after being attacked by a dog.   LEFT HEART CATH AND CORONARY ANGIOGRAPHY N/A 01/05/2022   Procedure: LEFT HEART CATH AND CORONARY ANGIOGRAPHY;  Surgeon: Lyn Records, MD;  Location: [facility] INVASIVE CV LAB;  Service: Cardiovascular;  Laterality: N/A;   RIGHT/LEFT HEART CATH AND CORONARY ANGIOGRAPHY N/A 01/09/2022   Procedure: RIGHT/LEFT HEART CATH AND CORONARY ANGIOGRAPHY;  Surgeon: Orbie Pyo, MD;  Location: [facility] INVASIVE CV LAB;  Service: Cardiovascular;  Laterality: N/A;    Allergies: Patient  has no known allergies.  Medications: Prior to Admission medications   Medication Sig Start Date End Date Taking? Authorizing Provider  acetaminophen (TYLENOL) 500 MG tablet Take 500-1,000 mg by mouth every 6 (six) hours as needed for moderate pain.    [provider]  amiodarone (PACERONE) 200 MG tablet Take 1 tablet (200 mg total) by mouth daily. Patient taking differently: Take 200 mg by mouth in the morning. 02/14/22   Robbie Lis M, PA-C  apixaban (ELIQUIS) 5 MG TABS tablet Take 1 tablet (5 mg total) by mouth 2 (two) times daily. 02/14/22   Robbie Lis M, PA-C  lactose free nutrition (BOOST) LIQD Take 237 mLs by mouth at bedtime.    [provider]  levothyroxine (SYNTHROID, LEVOTHROID) 88 MCG tablet Take 88 mcg by mouth daily before breakfast.    [provider]  metoprolol succinate (TOPROL XL) 25 MG 24 hr tablet Take 0.5 tablets (12.5 mg total) by mouth daily. 08/13/22   Milford, Anderson Malta, FNP  Multiple Vitamins-Minerals (ONE-A-DAY WOMENS 50+ ADVANTAGE) TABS Take 1 tablet by mouth daily with breakfast.    [provider]  oxyCODONE (OXY IR/ROXICODONE) 5 MG immediate release tablet Take 1 tablet (5 mg total) by mouth every 4 (four) hours as needed for moderate pain. 07/31/22   Danford, Earl Lites, MD  pantoprazole (PROTONIX) 40 MG tablet Take 40 mg by mouth 2 (two) times daily. 09/18/21   [provider]  polyethylene glycol powder (GLYCOLAX/MIRALAX)  17 GM/SCOOP powder Take 17 g by mouth daily as needed for mild constipation.    [provider]  sodium chloride 1 g tablet Take 1 tablet (1 g total) by mouth 2 (two) times daily with a meal. 07/31/22   Danford, Earl Lites, MD  spironolactone (ALDACTONE) 25 MG tablet Take 0.5 tablets (12.5 mg total) by mouth daily. 07/20/22 08/19/22  Jacklynn Ganong, FNP     Family History  Problem Relation Age of Onset   Heart attack Mother        died @ 11.   Diabetes type II Father         died @ 60.   Breast cancer Sister        died @ 45.    Social History   Socioeconomic History   Marital status: Widowed    Spouse name: Not on file   Number of children: 4   Years of education: Not on file   Highest education level: High school graduate  Occupational History   Occupation: Retired  Tobacco Use   Smoking status: Never   Smokeless tobacco: Never  Vaping Use   Vaping Use: Never used  Substance and Sexual Activity   Alcohol use: Never   Drug use: No   Sexual activity: Not on file  Other Topics Concern   Not on file  Social History Narrative   Moved to [place] from [place] in June 2014.  Lives at [facility].  Was exercising daily.   Social Determinants of Health   Financial Resource Strain: Low Risk  (07/10/2022)   Overall Financial Resource Strain (CARDIA)    Difficulty of Paying Living Expenses: Not hard at all  Food Insecurity: No Food Insecurity (07/24/2022)   Hunger Vital Sign    Worried About Running Out of Food in the Last Year: Never true    Ran Out of Food in the Last Year: Never true  Transportation Needs: No Transportation Needs (07/24/2022)   PRAPARE - Administrator, Civil Service (Medical): No    Lack of Transportation (Non-Medical): No  Physical Activity: Not on file  Stress: Not on file  Social Connections: Not on file     Review of Systems: A 12 point ROS discussed and pertinent positives are indicated in the HPI above.  All other systems are negative.  Review of Systems  Constitutional:  Negative for diaphoresis, fatigue and fever.  Respiratory:  Negative for cough and shortness of breath.   Cardiovascular:  Negative for chest pain.  Gastrointestinal:  Positive for abdominal pain. Negative for nausea and vomiting.  Musculoskeletal:  Negative for back pain.  Psychiatric/Behavioral:  Negative for behavioral problems and confusion.     Vital Signs: BP (!) 138/46 (BP Location: Right Arm)    Pulse 60   Temp 98.2 F (36.8 C) (Oral)   Resp 14   Ht  (1.651 m)   Wt 139 lb (63 kg)   SpO2 98%   BMI 23.13 kg/m   Physical Exam Vitals and nursing note reviewed.  Constitutional:      General: She is not in acute distress.    Appearance: Normal appearance. She is not ill-appearing.  HENT:     Mouth/Throat:     Mouth: Mucous membranes are moist.     Pharynx: Oropharynx is clear.  Cardiovascular:     Rate and Rhythm: Normal rate and regular rhythm.  Pulmonary:     Effort: Pulmonary effort is normal.  Breath sounds: Normal breath sounds.  Abdominal:     General: Abdomen is flat.     Palpations: Abdomen is soft.     Comments: RUQ drain in place.  Site intact.  Small amount of erythema.  No active leakage at time of visit but with evidence of small amount of drainage on dressing.  This appears most consistent with output in gravity bag. Drain flushes and aspirates well.   Skin:    General: Skin is warm and dry.  Neurological:     General: No focal deficit present.     Mental Status: She is alert and oriented to person, place, and time. Mental status is at baseline.  Psychiatric:        Mood and Affect: Mood normal.        Behavior: Behavior normal.        Thought Content: Thought content normal.        Judgment: Judgment normal.     Imaging: DG Chest 2 View  Result Date: 07/30/2022 CLINICAL DATA:  Abnormal lung sounds EXAM: CHEST - 2 VIEW COMPARISON:  X-ray 07/26/2022 and older.  CT 01/04/2022 FINDINGS: Underinflation. Once again there is an enlarged hiatal hernia. Stable cardiopericardial silhouette with bronchovascular crowding. No pneumothorax, effusion or edema. Calcified aorta. Degenerative changes of the spine with osteopenia. Pigtail catheter along the right upper quadrant of the abdomen. There is stable mild compression of the lower thoracic spine vertebral level. IMPRESSION: Underinflation with bronchovascular crowding. Large hiatal hernia. Electronically  Signed   By: Karen Kays M.D.   On: 07/30/2022 14:47   DG Chest Port 1 View  Result Date: 07/26/2022 CLINICAL DATA:  Shortness of breath EXAM: PORTABLE CHEST 1 VIEW COMPARISON:  07/24/2022, 06/17/2021, 01/10/2022 FINDINGS: Low lung volumes. Large air distended hiatal hernia. Hazy atelectasis left base. No consolidation, definitive pleural effusion or pneumothorax. Pigtail catheter in the right upper quadrant IMPRESSION: Low lung volumes with hazy atelectasis at the left base. Large air distended hiatal hernia. Electronically Signed   By: Jasmine Pang M.D.   On: 07/26/2022 22:07   CT PERC CHOLECYSTOSTOMY  Result Date: 07/26/2022 INDICATION: 89 year old with acute cholecystitis. Patient is not a surgical candidate at this time. EXAM: CT-GUIDED PLACEMENT OF CHOLECYSTOSTOMY TUBE MEDICATIONS: Moderate sedation ANESTHESIA/SEDATION: Moderate (conscious) sedation was employed during this procedure. A total of Versed 1 mg and Fentanyl 50 mcg was administered intravenously by the radiology nurse. Total intra-service moderate Sedation Time: 18 minutes. The patient's level of consciousness and vital signs were monitored continuously by radiology nursing throughout the procedure under my direct supervision. FLUOROSCOPY: None COMPLICATIONS: None immediate. PROCEDURE: Informed written consent was obtained from the patient after a thorough discussion of the procedural risks, benefits and alternatives. All questions were addressed. Maximal Sterile Barrier Technique was utilized including caps, mask, sterile gowns, sterile gloves, sterile drape, hand hygiene and skin antiseptic. A timeout was performed prior to the initiation of the procedure. Patient was placed supine on CT scanner. Images through the abdomen were obtained. The large abnormal gallbladder was identified with CT. Right side of the abdomen was prepped with chlorhexidine and sterile field was created. Skin was anesthetized using 1% lidocaine. Small incision  was made. Using CT guidance, an 18 gauge trocar needle was directed into the gallbladder and brownish red fluid was aspirated. Superstiff Amplatz wire was advanced into the gallbladder and the tract was dilated to accommodate a 10 [place] multipurpose drain. Approximately 130 mL of fluid was removed. Drain was attached to a gravity bag.  Fluid sample was sent for culture. Follow up CT images were obtained. Drain was sutured to skin and a dressing was placed. FINDINGS: The gallbladder was markedly distended with surrounding inflammatory change and large hyperdense stones. Drain was placed within the gallbladder without traversing liver parenchyma. Gallbladder was partially decompressed at the end of the procedure. IMPRESSION: CT-guided placement of a cholecystostomy tube. Fluid was sent for culture. Electronically Signed   By: Richarda Overlie M.D.   On: 07/26/2022 13:59    Labs:  CBC: Recent Labs    07/28/22 0429 07/29/22 0428 07/30/22 0414 07/31/22 0445  WBC 7.6 8.8 10.2 10.4  HGB 11.8* 12.8 12.9 12.7  HCT 36.3 38.5 39.1 38.9  PLT 276 292 301 323    COAGS: Recent Labs    07/25/22 0440 07/26/22 0444  INR 1.8* 1.5*    BMP: Recent Labs    07/28/22 0429 07/29/22 0428 07/30/22 0414 07/31/22 0445  NA 128* 125* 126* 127*  K 3.4* 3.4* 3.8 4.3  CL 93* 90* 89* 91*  CO2 26 26 25 28   GLUCOSE 129* 122* 106* 104*  BUN 30* 30* 28* 25*  CALCIUM 7.3* 7.5* 7.5* 7.9*  CREATININE 1.02* 1.01* 0.89 0.89  GFRNONAA 53* 53* >60 >60    LIVER FUNCTION TESTS: Recent Labs    07/26/22 0444 07/27/22 0038 07/28/22 0429 07/29/22 0428  BILITOT 0.5 0.4 0.6 0.6  AST 26 28 18 19   ALT 43 38 29 25  ALKPHOS 40 50 43 41  PROT 5.3* 5.8* 5.4* 5.7*  ALBUMIN 2.4* 2.5* 2.2* 2.5*    TUMOR MARKERS: No results for input(s): "AFPTM", "CEA", "CA199", "CHROMGRNA" in the last 8760 hours.  Assessment and Plan: Pain at cholecystostomy tube site Patient assessed in ED this afternoon for pain at her drain site. There  was concern from staff regarding a site infection.  She does have a small amount of drainage on the dressing without active leakage during exam today.  Reports drain has been leaking on/off since placement.   Drain site appears overall intact.  Small amount of erythema vs. Granulation tissue.  Suture intact.  Drain flushes and aspirates easily with return of thick, murky, bilious output.  Dr. Loreta Ave has recommended KUB for placement confirmation.  Further recommendations pending imaging and ongoing evaluation.   Thank you for this interesting consult.  I greatly enjoyed meeting CORTINA VULTAGGIO and look forward to participating in their care.  A copy of this report was sent to the requesting provider on this date.  Electronically Signed: Hoyt Koch, PA 08/23/2022, 4:49 PM   I spent a total of  15 Minutes   in face to face in clinical consultation, greater than 50% of which was counseling/coordinating care for pain at cholecystostomy drain site.

## 2022-08-23 NOTE — ED Notes (Signed)
Pt is a&ox4, pwd. Pt is complaining of tenderness to right sided biliary drain. Site is bandaged well, there is no drainage noted to dressing. Pt is attached to monitor/vitals. Pt came in on O2 but says that she does not usually oxygen at home. Side rails x 2 up, call light within reach. Covered with warm blanket.

## 2022-08-23 NOTE — ED Notes (Signed)
ED Provider at bedside. 

## 2022-08-24 ENCOUNTER — Encounter (HOSPITAL_COMMUNITY): Payer: Self-pay | Admitting: Internal Medicine

## 2022-08-24 ENCOUNTER — Observation Stay (HOSPITAL_COMMUNITY): Payer: PPO

## 2022-08-24 DIAGNOSIS — E039 Hypothyroidism, unspecified: Secondary | ICD-10-CM

## 2022-08-24 DIAGNOSIS — I10 Essential (primary) hypertension: Secondary | ICD-10-CM

## 2022-08-24 DIAGNOSIS — I480 Paroxysmal atrial fibrillation: Secondary | ICD-10-CM

## 2022-08-24 DIAGNOSIS — I5032 Chronic diastolic (congestive) heart failure: Secondary | ICD-10-CM

## 2022-08-24 DIAGNOSIS — T85848A Pain due to other internal prosthetic devices, implants and grafts, initial encounter: Secondary | ICD-10-CM | POA: Diagnosis not present

## 2022-08-24 DIAGNOSIS — T85518A Breakdown (mechanical) of other gastrointestinal prosthetic devices, implants and grafts, initial encounter: Secondary | ICD-10-CM

## 2022-08-24 HISTORY — PX: IR CHOLANGIOGRAM EXISTING TUBE: IMG6040

## 2022-08-24 HISTORY — PX: IR EXCHANGE BILIARY DRAIN: IMG6046

## 2022-08-24 LAB — CBC WITH DIFFERENTIAL/PLATELET
Abs Immature Granulocytes: 0 10*3/uL (ref 0.00–0.07)
Basophils Absolute: 0 10*3/uL (ref 0.0–0.1)
Basophils Relative: 0 %
Eosinophils Absolute: 0.1 10*3/uL (ref 0.0–0.5)
Eosinophils Relative: 1 %
HCT: 35.5 % — ABNORMAL LOW (ref 36.0–46.0)
Hemoglobin: 11.6 g/dL — ABNORMAL LOW (ref 12.0–15.0)
Lymphocytes Relative: 19 %
Lymphs Abs: 2.3 10*3/uL (ref 0.7–4.0)
MCH: 31 pg (ref 26.0–34.0)
MCHC: 32.7 g/dL (ref 30.0–36.0)
MCV: 94.9 fL (ref 80.0–100.0)
Monocytes Absolute: 0.9 10*3/uL (ref 0.1–1.0)
Monocytes Relative: 7 %
Neutro Abs: 9 10*3/uL — ABNORMAL HIGH (ref 1.7–7.7)
Neutrophils Relative %: 73 %
Platelets: 388 10*3/uL (ref 150–400)
RBC: 3.74 MIL/uL — ABNORMAL LOW (ref 3.87–5.11)
RDW: 13.7 % (ref 11.5–15.5)
WBC: 12.3 10*3/uL — ABNORMAL HIGH (ref 4.0–10.5)
nRBC: 0 % (ref 0.0–0.2)
nRBC: 0 /100 WBC

## 2022-08-24 LAB — MAGNESIUM: Magnesium: 1.9 mg/dL (ref 1.7–2.4)

## 2022-08-24 LAB — COMPREHENSIVE METABOLIC PANEL
ALT: 34 U/L (ref 0–44)
AST: 28 U/L (ref 15–41)
Albumin: 2.2 g/dL — ABNORMAL LOW (ref 3.5–5.0)
Alkaline Phosphatase: 58 U/L (ref 38–126)
Anion gap: 7 (ref 5–15)
BUN: 17 mg/dL (ref 8–23)
CO2: 30 mmol/L (ref 22–32)
Calcium: 8.1 mg/dL — ABNORMAL LOW (ref 8.9–10.3)
Chloride: 91 mmol/L — ABNORMAL LOW (ref 98–111)
Creatinine, Ser: 0.84 mg/dL (ref 0.44–1.00)
GFR, Estimated: >60 mL/min (ref >60)
Glucose, Bld: 93 mg/dL (ref 70–99)
Potassium: 3.9 mmol/L (ref 3.5–5.1)
Sodium: 128 mmol/L — ABNORMAL LOW (ref 135–145)
Total Bilirubin: 0.6 mg/dL (ref 0.3–1.2)
Total Protein: 5.4 g/dL — ABNORMAL LOW (ref 6.5–8.1)

## 2022-08-24 LAB — PROTIME-INR
INR: 1.4 — ABNORMAL HIGH (ref 0.8–1.2)
Prothrombin Time: 17.2 seconds — ABNORMAL HIGH (ref 11.4–15.2)

## 2022-08-24 MED ORDER — METOPROLOL SUCCINATE ER 25 MG PO TB24
12.5000 mg | ORAL_TABLET | Freq: Every day | ORAL | Status: DC
  Administered 2022-08-25 – 2022-08-29 (×4): 12.5 mg via ORAL
  Filled 2022-08-24 (×5): qty 1

## 2022-08-24 MED ORDER — ORAL CARE MOUTH RINSE: 15.0000 mL | OROMUCOSAL | Status: DC | PRN

## 2022-08-24 MED ORDER — FENTANYL CITRATE PF 50 MCG/ML IJ SOSY
25.0000 ug | PREFILLED_SYRINGE | INTRAMUSCULAR | Status: DC | PRN
  Administered 2022-08-24 – 2022-08-29 (×3): 25 ug via INTRAVENOUS
  Filled 2022-08-24 (×3): qty 1

## 2022-08-24 MED ORDER — ACETAMINOPHEN 650 MG RE SUPP: 650.0000 mg | Freq: Four times a day (QID) | RECTAL | Status: DC | PRN

## 2022-08-24 MED ORDER — IOHEXOL 300 MG/ML  SOLN
50.0000 mL | Freq: Once | INTRAMUSCULAR | Status: AC | PRN
  Administered 2022-08-24: 15 mL

## 2022-08-24 MED ORDER — MELATONIN 3 MG PO TABS
3.0000 mg | ORAL_TABLET | Freq: Every evening | ORAL | Status: DC | PRN
  Administered 2022-08-26 – 2022-08-28 (×2): 3 mg via ORAL
  Filled 2022-08-24 (×2): qty 1

## 2022-08-24 MED ORDER — LEVOTHYROXINE SODIUM 88 MCG PO TABS
88.0000 ug | ORAL_TABLET | Freq: Every day | ORAL | Status: DC
  Administered 2022-08-24 – 2022-08-29 (×5): 88 ug via ORAL
  Filled 2022-08-24 (×5): qty 1

## 2022-08-24 MED ORDER — SODIUM CHLORIDE 0.9% FLUSH
5.0000 mL | Freq: Three times a day (TID) | INTRAVENOUS | Status: DC
  Administered 2022-08-24 – 2022-08-29 (×15): 5 mL

## 2022-08-24 MED ORDER — NALOXONE HCL 0.4 MG/ML IJ SOLN: 0.4000 mg | INTRAMUSCULAR | Status: DC | PRN

## 2022-08-24 MED ORDER — AMIODARONE HCL 200 MG PO TABS
200.0000 mg | ORAL_TABLET | Freq: Every morning | ORAL | Status: DC
  Administered 2022-08-24 – 2022-08-29 (×6): 200 mg via ORAL
  Filled 2022-08-24 (×6): qty 1

## 2022-08-24 MED ORDER — ACETAMINOPHEN 325 MG PO TABS
650.0000 mg | ORAL_TABLET | Freq: Four times a day (QID) | ORAL | Status: DC | PRN
  Administered 2022-08-24 – 2022-08-28 (×9): 650 mg via ORAL
  Filled 2022-08-24 (×9): qty 2

## 2022-08-24 MED ORDER — APIXABAN 5 MG PO TABS
5.0000 mg | ORAL_TABLET | Freq: Two times a day (BID) | ORAL | Status: DC
  Administered 2022-08-24 – 2022-08-29 (×10): 5 mg via ORAL
  Filled 2022-08-24 (×10): qty 1

## 2022-08-24 MED ORDER — ONDANSETRON HCL 4 MG/2ML IJ SOLN: 4.0000 mg | Freq: Four times a day (QID) | INTRAMUSCULAR | Status: DC | PRN

## 2022-08-24 MED ORDER — LACTATED RINGERS IV SOLN: INTRAVENOUS | Status: AC

## 2022-08-24 MED ORDER — LIDOCAINE HCL 1 % IJ SOLN
INTRAMUSCULAR | Status: AC
  Filled 2022-08-24: qty 20

## 2022-08-24 NOTE — Progress Notes (Addendum)
CSW confirmed with [facility] that pt can return when ready for DC. Daleen Squibb, MSW, LCSW 4/19/202412:10 PM   1510: CSW contacted by pt daughter in law, [name].  Requesting medicaid screening.  Pt is currently at [facility] but in copay days and cannot really afford to continue those.  Likely needs long term care, does not have money to pay and hoping she is medicaid eligible.  They are not sure they can have her return to [facility] at DC based on the finances. Referral made to financial counseling for medicaid screening. Daleen Squibb, MSW, LCSW 4/19/20243:20 PM

## 2022-08-24 NOTE — H&P (Signed)
History and Physical      Ellen Chandler ZOX:[id number] DOB: [birth year] DOA: 08/23/2022  PCP: Eloisa Northern, MD  Patient coming from: home   I have personally briefly reviewed patient's old medical records in [facility]  Chief Complaint: Leakage around cholecystostomy tube  HPI: Ellen Chandler is a 89 y.o. female with medical history significant for paroxysmal atrial fibrillation chronically anticoagulated on Eliquis, chronic diastolic heart failure, essential pretension, acquired hypothyroidism, who is admitted to [facility] on 08/23/2022 with complication relating to recently placed cholecystostomy tube after presenting from SNF to [facility] ED complaining of leakage around cholecystostomy tube.   The patient was recently hospitalized at [facility] from 07/24/2022 to 07/31/2022 for acute cholecystitis.  That hospitalization, she was felt to be a high risk surgical candidate, prompting election to pursue cholecystostomy tube as management of her acute cholecystitis.  She was subsequently discharged back to SNF on 07/31/2022.  However, over the course of the last day, as noted progressive leakage around her cholecystostomy tube, prompting her presentation to [facility] emergency department this evening for further evaluation management of such.  Aside from the leakage around her cholecystostomy tube, the patient is without any acute complaints.  Denies any worsening abdominal discomfort.  She also denies any subjective fever, chills, rigors, or generalized myalgias.  Denies any nausea, vomiting, diarrhea.  No recent chest pain, shortness of breath, palpitations, diaphoresis, dizziness, presyncope, or syncope.  Her medical history is notable for chronic diastolic heart failure as well as paroxysmal atrial fibrillation.  Most recent echocardiogram occurred on 07/09/2022 was notable for LVEF 55 to 60%, no focal wall motion maladies, grade 2 diastolic dysfunction, normal right ventricular  systolic function, severely dilated left atrium as well as mild mitral regurgitation and mild aortic regurgitation.     ED Course:  Vital signs in the ED were notable for the following: Afebrile; heart rate 57-62; systolic blood pressures in the low 100s to 120s; respiratory rate 14-22, oxygen saturation 97 to 98% on room air.  Labs were notable for the following: BMP notable for potassium 4.1, bicarbonate 29, creatinine 0.84.  CBC notable for open cell count 12,700 with 63% neutrophils and relative to blood cell count of 23,200 on 07/24/2022, hemoglobin 12.4.  Imaging and additional notable ED work-up: Plain film of the abdomen, performed radiology read, showed indeterminate location of the pigtail catheter, without any evidence of free air under the diaphragm or any evidence of bowel obstruction.  EDP discussed patient's case with the on-call interventional radiologist, who agreed to formally consult and is planning to exchange patient's cholecystostomy tube in the morning (4/19).  While in the ED, the following were administered: None  Subsequently, the patient was admitted for overnight observation for further evaluation management of presenting complication of existing cholecystostomy tube.     Review of Systems: As per HPI otherwise 10 point review of systems negative.   Past Medical History:  Diagnosis Date   Abnormal ECG    a. Pt aware of long hx of abnl ecg->h/o normal stress testing in [place] ~ 2000.   Diastolic dysfunction    a. 04/2013 Echo: EF 60-65%, No rwma, Gr 1 DD, mild MR.   GERD (gastroesophageal reflux disease)    Hypertension    Hypothyroidism     Past Surgical History:  Procedure Laterality Date   ABDOMINAL HYSTERECTOMY     JOINT REPLACEMENT     a. knee left - injury resulted after being attacked by a dog.  LEFT HEART CATH AND CORONARY ANGIOGRAPHY N/A 01/05/2022   Procedure: LEFT HEART CATH AND CORONARY ANGIOGRAPHY;  Surgeon: Lyn Records, MD;   [facility] IN[facility];  Service: Cardiovascular;  Laterality: N/A;   RIGHT/LEFT HEART CATH AND CORONARY ANGIOGRAPHY N/A 01/09/2022   Procedure: RIGHT/LEFT HEART CATH AND CORONARY ANGIOGRAPHY;  Surgeon: Orbie Pyo, MD;  Location: [facility] INVASIVE CV LAB;  Service: Cardiovascular;  Laterality: N/A;    Social History:  reports that she has never smoked. She has never used smokeless tobacco. She reports that she does not drink alcohol and does not use drugs.   No Known Allergies  Family History  Problem Relation Age of Onset   Heart attack Mother        died @ 33.   Diabetes type II Father        died @ 83.   Breast cancer Sister        died @ 81.    Family history reviewed and not pertinent    Prior to Admission medications   Medication Sig Start Date End Date Taking? Authorizing Provider  acetaminophen (TYLENOL) 500 MG tablet Take 500-1,000 mg by mouth every 6 (six) hours as needed for moderate pain.    [provider]  amiodarone (PACERONE) 200 MG tablet Take 1 tablet (200 mg total) by mouth daily. Patient taking differently: Take 200 mg by mouth in the morning. 02/14/22   Robbie Lis M, PA-C  apixaban (ELIQUIS) 5 MG TABS tablet Take 1 tablet (5 mg total) by mouth 2 (two) times daily. 02/14/22   Robbie Lis M, PA-C  lactose free nutrition (BOOST) LIQD Take 237 mLs by mouth at bedtime.    [provider]  levothyroxine (SYNTHROID, LEVOTHROID) 88 MCG tablet Take 88 mcg by mouth daily before breakfast.    [provider]  metoprolol succinate (TOPROL XL) 25 MG 24 hr tablet Take 0.5 tablets (12.5 mg total) by mouth daily. 08/13/22   Milford, Anderson Malta, FNP  Multiple Vitamins-Minerals (ONE-A-DAY WOMENS 50+ ADVANTAGE) TABS Take 1 tablet by mouth daily with breakfast.    [provider]  oxyCODONE (OXY IR/ROXICODONE) 5 MG immediate release tablet Take 1 tablet (5 mg total) by mouth every 4 (four) hours as needed for moderate pain.  07/31/22   Danford, Earl Lites, MD  pantoprazole (PROTONIX) 40 MG tablet Take 40 mg by mouth 2 (two) times daily. 09/18/21   [provider]  polyethylene glycol powder (GLYCOLAX/MIRALAX) 17 GM/SCOOP powder Take 17 g by mouth daily as needed for mild constipation.    [provider]  sodium chloride 1 g tablet Take 1 tablet (1 g total) by mouth 2 (two) times daily with a meal. 07/31/22   Danford, Earl Lites, MD  spironolactone (ALDACTONE) 25 MG tablet Take 0.5 tablets (12.5 mg total) by mouth daily. 07/20/22 08/19/22  Jacklynn Ganong, FNP     Objective    Physical Exam: Vitals:   08/23/22 2200 08/23/22 2215 08/23/22 2230 08/23/22 2300  BP: (!) 105/44 (!) 110/42 132/83 (!) 110/53  Pulse: 60 (!) 58 (!) 59 (!) 57  Resp: Temp:      TempSrc:      SpO2: 97% 98% 97% 97%  Weight:      Height:        General: appears to be stated age; alert, oriented Skin: warm, dry, no rash Head:  AT/[place] Mouth:  Oral mucosa membranes appear dry, normal dentition Neck: supple; trachea  midline Heart:  RRR; did not appreciate any M/R/G Lungs: CTAB, did not appreciate any wheezes, rales, or rhonchi Abdomen: + BS; soft, ND, NT; cholecystostomy tube noted Vascular: 2+ pedal pulses b/l; 2+ radial pulses b/l Extremities: no peripheral edema, no muscle wasting Neuro: strength and sensation intact in upper and lower extremities b/l    Labs on Admission: I have personally reviewed following labs and imaging studies  CBC: Recent Labs  Lab 08/23/22 1609  WBC 12.7*  NEUTROABS 8.0*  HGB 12.4  HCT 37.4  MCV 93.7  PLT 397   Basic Metabolic Panel: Recent Labs  Lab 08/23/22 1609 08/23/22 2246  NA 135 132*  K 2.7* 4.1  CL 105 91*  CO2 25 29  GLUCOSE 73 94  BUN 14 18  CREATININE 0.60 0.84  CALCIUM 5.7* 8.4*   GFR: Estimated Creatinine Clearance: 40.9 mL/min (by C-G formula based on SCr of 0.84 mg/dL). Liver Function Tests: Recent Labs  Lab 08/23/22 1609  AST  23  ALT 27  ALKPHOS 40  BILITOT 0.2*  PROT 3.7*  ALBUMIN <1.5*   No results for input(s): "LIPASE", "AMYLASE" in the last 168 hours. No results for input(s): "AMMONIA" in the last 168 hours. Coagulation Profile: No results for input(s): "INR", "PROTIME" in the last 168 hours. Cardiac Enzymes: No results for input(s): "CKTOTAL", "CKMB", "CKMBINDEX", "TROPONINI" in the last 168 hours. BNP (last 3 results) No results for input(s): "PROBNP" in the last 8760 hours. HbA1C: No results for input(s): "HGBA1C" in the last 72 hours. CBG: No results for input(s): "GLUCAP" in the last 168 hours. Lipid Profile: No results for input(s): "CHOL", "HDL", "LDLCALC", "TRIG", "CHOLHDL", "LDLDIRECT" in the last 72 hours. Thyroid Function Tests: No results for input(s): "TSH", "T4TOTAL", "FREET4", "T3FREE", "THYROIDAB" in the last 72 hours. Anemia Panel: No results for input(s): "VITAMINB12", "FOLATE", "FERRITIN", "TIBC", "IRON", "RETICCTPCT" in the last 72 hours. Urine analysis:    Component Value Date/Time   COLORURINE YELLOW 02/14/2021 1815   APPEARANCEUR CLEAR 02/14/2021 1815   LABSPEC 1.011 02/14/2021 1815   PHURINE 5.0 02/14/2021 1815   GLUCOSEU NEGATIVE 02/14/2021 1815   HGBUR SMALL (A) 02/14/2021 1815   BILIRUBINUR NEGATIVE 02/14/2021 1815   KETONESUR NEGATIVE 02/14/2021 1815   PROTEINUR NEGATIVE 02/14/2021 1815   UROBILINOGEN 0.2 05/19/2014 0635   NITRITE NEGATIVE 02/14/2021 1815   LEUKOCYTESUR NEGATIVE 02/14/2021 1815    Radiological Exams on Admission: DG Abd Portable 1 View  Result Date: 08/23/2022 CLINICAL DATA:  Drain placement EXAM: PORTABLE ABDOMEN - 1 VIEW COMPARISON:  X-ray 09/20/2017. Drain placement CT procedure 07/26/2022 FINDINGS: Gas is seen in nondilated loops of small and large bowel. Scattered colonic stool. The diaphragm in the left hemi abdominal edge are clipped off the edge of the film. The film is rotated to the left. Pigtail catheter overlies the right upper  quadrant of the abdomen. This very well could be in the gallbladder. However evaluation is limited by x-ray and if needed a CT or ultrasound could be considered IMPRESSION: Nonspecific bowel gas pattern. Pigtail catheter in the right upper quadrant could be within the gallbladder but indeterminate on this 1 portable view x-ray. If needed confirmatory ultrasound or CT could be considered Electronically Signed   By: Karen Kays M.D.   On: 08/23/2022 16:54      Assessment/Plan   Principal Problem:   Cholecystostomy tube dysfunction Active Problems:   Essential hypertension   Chronic diastolic CHF (congestive heart failure)   Acquired hypothyroidism   Paroxysmal A-fib      #)  Complication of cholecystostomy tube: Relating to recently placed cholecystostomy tube during most recent prior hospitalization for acute cholecystitis, the patient presents with 1 day of leakage around her cholecystostomy tube, with ensuing plain film of the abdomen showing indeterminate location of associated pigtail catheter, prompting EDP to consult interventional radiology, who have conveyed that they will formally consult and are planning on exchange of cholecystostomy tube in the morning (4/19).  No evidence of acute peritoneal signs on physical exam of the abdomen.  As quantified above, SIRS criteria not met for sepsis, and the patient appears hemodynamically stable at this time.  Plan: Interventional radiology formally consulted for exchange of cholecystostomy tube in the morning, as above.  NPO.  Check INR.                #) Chronic diastolic heart failure: documented history of such, with most recent echocardiogram performed on 07/09/2022, which is notable for LVEF 55 to 60%, grade 2 diastolic dysfunction, normal right ventricular systolic function, with additional details as conveyed above. No clinical evidence to suggest acutely decompensated heart failure at this time. home diuretic regimen  reportedly consists of the following: Spironolactone.    Plan: monitor strict I's & O's and daily weights. Repeat CMP in AM. Check serum mag level.  In the setting of current n.p.o. status leading up to exchange of cholecystostomy tube via interventional radiology in the morning, will hold next dose of spironolactone.               #) Paroxysmal atrial fibrillation: Documented history of such. In setting of CHA2DS2-VASc score of 5, there is an indication for chronic anticoagulation for thromboembolic prophylaxis. Consistent with this, patient is chronically anticoagulated on request. Home AV nodal blocking regimen: Metoprolol succinate.  Most recent echocardiogram was performed in March 2024, with results notable for severely dilated left atrium, increasing risk for persistent atrial fibrillation.   Plan: monitor strict I's & O's and daily weights. CMP/CBC in AM. Check serum mag level. Continue home AV nodal blocking regimen.  Will hold next dose of Eliquis in the setting of IR as planned for cholecystostomy change in the morning.               #) Essential Hypertension: documented h/o such, with outpatient antihypertensive regimen including metoprolol succinate.  Number SBP's in the ED today: Normotensive.   Plan: Close monitoring of subsequent BP via routine VS. continue home beta-blocker.                #) acquired hypothyroidism: documented h/o such, on Synthroid as outpatient.   Plan: cont home Synthroid.       DVT prophylaxis: SCD's   Code Status: DNR, consistent active DNR paperwork on file Family Communication: none Disposition Plan: Per Rounding Team Consults called: EDP discussed patient's case with on-call interventional radiology, who agreed to formally consult and are planning for cholecystostomy tube exchange in the morning (4/19), as further detailed above;  Admission status: Observation     I SPENT GREATER THAN 75  MINUTES IN  CLINICAL CARE TIME/MEDICAL DECISION-MAKING IN COMPLETING THIS ADMISSION.      Chaney Born Arty Lantzy DO [facility]  From 7PM - 7AM   08/24/2022, 12:23 AM

## 2022-08-24 NOTE — ED Notes (Signed)
ED TO INPATIENT HANDOFF REPORT  ED Nurse Name and Phone #: Amil Amen [phone]  S Name/Age/Gender Ellen Chandler 89 y.o. female Room/Bed: 002C/002C  Code Status   Code Status: DNR  Home/SNF/Other Nursing Home Patient oriented to: self Is this baseline? Yes      Chief Complaint Cholecystostomy tube dysfunction [T85.518A]  Triage Note Pt coming from SNF via EMS for a dislodged biliary drain on right side. Pt is a&ox4, pwd. Pt complains of discomfort to site when touched. No other complaints. Pt denies cp/shob   Allergies No Known Allergies  Level of Care/Admitting Diagnosis ED Disposition     ED Disposition  Admit   Condition  --   Comment  Hospital Area: [facility] [[id number]]  Level of Care: Med-Surg [16]  May place patient in observation at [facility] or [facility] if equivalent level of care is available:: No  Covid Evaluation: Asymptomatic - no recent exposure (last 10 days) testing not required  Diagnosis: Cholecystostomy tube dysfunction [[id number]]  Admitting Physician: Angie Fava [[id number]]  Attending Physician: Angie Fava [[id number]]          B Medical/Surgery History Past Medical History:  Diagnosis Date   Abnormal ECG    a. Pt aware of long hx of abnl ecg->h/o normal stress testing in [place] ~ 2000.   Diastolic dysfunction    a. 04/2013 Echo: EF 60-65%, No rwma, Gr 1 DD, mild MR.   GERD (gastroesophageal reflux disease)    Hypertension    Hypothyroidism    Past Surgical History:  Procedure Laterality Date   ABDOMINAL HYSTERECTOMY     JOINT REPLACEMENT     a. knee left - injury resulted after being attacked by a dog.   LEFT HEART CATH AND CORONARY ANGIOGRAPHY N/A 01/05/2022   Procedure: LEFT HEART CATH AND CORONARY ANGIOGRAPHY;  Surgeon: Lyn Records, MD;  Location: [facility] INVASIVE CV LAB;  Service: Cardiovascular;  Laterality: N/A;   RIGHT/LEFT HEART CATH AND CORONARY ANGIOGRAPHY N/A 01/09/2022   Procedure:  RIGHT/LEFT HEART CATH AND CORONARY ANGIOGRAPHY;  Surgeon: Orbie Pyo, MD;  Location: [facility] INVASIVE CV LAB;  Service: Cardiovascular;  Laterality: N/A;     A IV Location/Drains/Wounds Patient Lines/Drains/Airways Status     Active Line/Drains/Airways     Name Placement date Placement time Site Days   Peripheral IV 08/23/22 20 G Left Antecubital 08/23/22  1720  Antecubital  1   Closed System Drain 1 RUQ Other (Comment) 10 Fr. 07/26/22  1141  RUQ  29            Intake/Output Last 24 hours No intake or output data in the 24 hours ending 08/24/22 0209  Labs/Imaging Results for orders placed or performed during the hospital encounter of 08/23/22 (from the past 48 hour(s))  Comprehensive metabolic panel     Status: Abnormal   Collection Time: 08/23/22  4:09 PM  Result Value Ref Range   Sodium 135 135 - 145 mmol/L   Potassium 2.7 (LL) 3.5 - 5.1 mmol/L    Comment: CRITICAL RESULT CALLED TO, READ BACK BY AND VERIFIED WITH J.Zissel Biederman,RN  08/23/2022 VANG.J   Chloride 105 98 - 111 mmol/L   CO2 25 22 - 32 mmol/L   Glucose, Bld 73 70 - 99 mg/dL    Comment: Glucose reference range applies only to samples taken after fasting for at least 8 hours.   BUN 14 8 - 23 mg/dL   Creatinine, Ser 8.65 0.44 - 1.00 mg/dL  Calcium 5.7 (LL) 8.9 - 10.3 mg/dL    Comment: CRITICAL RESULT CALLED TO, READ BACK BY AND VERIFIED WITH J.Jerad Dunlap,RN @2001  08/23/2022 VANG.J   Total Protein 3.7 (L) 6.5 - 8.1 g/dL   Albumin <1.6 (L) 3.5 - 5.0 g/dL   AST 23 15 - 41 U/L   ALT 27 0 - 44 U/L   Alkaline Phosphatase 40 38 - 126 U/L   Total Bilirubin 0.2 (L) 0.3 - 1.2 mg/dL   GFR, Estimated >10 >96 mL/min    Comment: (NOTE) Calculated using the CKD-EPI Creatinine Equation (2021)    Anion gap 5 5 - 15    Comment: Performed at [facility], [address], [place], [place] [address]  CBC with Differential     Status: Abnormal   Collection Time: 08/23/22  4:09 PM  Result Value Ref Range   WBC 12.7 (H)  4.0 - 10.5 K/uL   RBC 3.99 3.87 - 5.11 MIL/uL   Hemoglobin 12.4 12.0 - 15.0 g/dL   HCT 98.1 19.1 - 47.8 %   MCV 93.7 80.0 - 100.0 fL   MCH 31.1 26.0 - 34.0 pg   MCHC 33.2 30.0 - 36.0 g/dL   RDW 29.5 62.1 - 30.8 %   Platelets 397 150 - 400 K/uL   nRBC 0.0 0.0 - 0.2 %   Neutrophils Relative % 63 %   Neutro Abs 8.0 (H) 1.7 - 7.7 K/uL   Lymphocytes Relative 18 %   Lymphs Abs 2.3 0.7 - 4.0 K/uL   Monocytes Relative 11 %   Monocytes Absolute 1.4 (H) 0.1 - 1.0 K/uL   Eosinophils Relative 2 %   Eosinophils Absolute 0.2 0.0 - 0.5 K/uL   Basophils Relative 1 %   Basophils Absolute 0.1 0.0 - 0.1 K/uL   Immature Granulocytes 5 %   Abs Immature Granulocytes 0.67 (H) 0.00 - 0.07 K/uL    Comment: Performed at [facility], [address] [place], [place] [address]  Basic metabolic panel     Status: Abnormal   Collection Time: 08/23/22 10:46 PM  Result Value Ref Range   Sodium 132 (L) 135 - 145 mmol/L   Potassium 4.1 3.5 - 5.1 mmol/L    Comment: DELTA CHECK NOTED   Chloride 91 (L) 98 - 111 mmol/L   CO2 29 22 - 32 mmol/L   Glucose, Bld 94 70 - 99 mg/dL    Comment: Glucose reference range applies only to samples taken after fasting for at least 8 hours.   BUN 18 8 - 23 mg/dL   Creatinine, Ser 6.96 0.44 - 1.00 mg/dL   Calcium 8.4 (L) 8.9 - 10.3 mg/dL    Comment: DELTA CHECK NOTED   GFR, Estimated >60 >60 mL/min    Comment: (NOTE) Calculated using the CKD-EPI Creatinine Equation (2021)    Anion gap 12 5 - 15    Comment: Performed at [facility], [address] [place], [place] [address]  CBC with Differential/Platelet     Status: Abnormal (Preliminary result)   Collection Time: 08/24/22  1:30 AM  Result Value Ref Range   WBC 12.3 (H) 4.0 - 10.5 K/uL   RBC 3.74 (L) 3.87 - 5.11 MIL/uL   Hemoglobin 11.6 (L) 12.0 - 15.0 g/dL   HCT 41.3 (L) 24.4 - 01.0 %   MCV 94.9 80.0 - 100.0 fL   MCH 31.0 26.0 - 34.0 pg   MCHC 32.7 30.0 - 36.0 g/dL   RDW 27.2 53.6 -  15.5 %   Platelets  388 150 - 400 K/uL   nRBC 0.0 0.0 - 0.2 %    Comment: Performed at [facility], [address], [place], [place] [address]   Neutrophils Relative % PENDING %   Neutro Abs PENDING 1.7 - 7.7 K/uL   Band Neutrophils PENDING %   Lymphocytes Relative PENDING %   Lymphs Abs PENDING 0.7 - 4.0 K/uL   Monocytes Relative PENDING %   Monocytes Absolute PENDING 0.1 - 1.0 K/uL   Eosinophils Relative PENDING %   Eosinophils Absolute PENDING 0.0 - 0.5 K/uL   Basophils Relative PENDING %   Basophils Absolute PENDING 0.0 - 0.1 K/uL   WBC Morphology PENDING    RBC Morphology PENDING    Smear Review PENDING    Other PENDING %   nRBC PENDING 0 /100 WBC   Metamyelocytes Relative PENDING %   Myelocytes PENDING %   Promyelocytes Relative PENDING %   Blasts PENDING %   Immature Granulocytes PENDING %   Abs Immature Granulocytes PENDING 0.00 - 0.07 K/uL  Protime-INR     Status: Abnormal   Collection Time: 08/24/22  1:30 AM  Result Value Ref Range   Prothrombin Time 17.2 (H) 11.4 - 15.2 seconds   INR 1.4 (H) 0.8 - 1.2    Comment: (NOTE) INR goal varies based on device and disease states. Performed at [facility], [address] [place], [place] [address]    DG Abd Portable 1 View  Result Date: 08/23/2022 CLINICAL DATA:  Drain placement EXAM: PORTABLE ABDOMEN - 1 VIEW COMPARISON:  X-ray 09/20/2017. Drain placement CT procedure 07/26/2022 FINDINGS: Gas is seen in nondilated loops of small and large bowel. Scattered colonic stool. The diaphragm in the left hemi abdominal edge are clipped off the edge of the film. The film is rotated to the left. Pigtail catheter overlies the right upper quadrant of the abdomen. This very well could be in the gallbladder. However evaluation is limited by x-ray and if needed a CT or ultrasound could be considered IMPRESSION: Nonspecific bowel gas pattern. Pigtail catheter in the right upper quadrant could be within the gallbladder but indeterminate on this 1  portable view x-ray. If needed confirmatory ultrasound or CT could be considered Electronically Signed   By: Karen Kays M.D.   On: 08/23/2022 16:54    Pending Labs Unresulted Labs (From admission, onward)     Start     Ordered   08/24/22 0500  Comprehensive metabolic panel  Tomorrow morning,   R        08/24/22 0007   08/24/22 0500  Magnesium  Tomorrow morning,   R        08/24/22 0007            Vitals/Pain Today's Vitals   08/23/22 2232 08/23/22 2300 08/24/22 0030 08/24/22 0200  BP:  (!) 110/53 (!) 121/52 (!) 105/45  Pulse:  (!) 57 (!) 56 (!) 56  Resp:  20 17 18   Temp:      TempSrc:      SpO2:  97% 98% 97%  Weight:      Height:      PainSc: 0-No pain       Isolation Precautions No active isolations  Medications Medications  acetaminophen (TYLENOL) tablet 650 mg (has no administration in time range)    Or  acetaminophen (TYLENOL) suppository 650 mg (has no administration in time range)  melatonin tablet 3 mg (has no  administration in time range)  ondansetron (ZOFRAN) injection 4 mg (has no administration in time range)    Mobility non-ambulatory     Focused Assessments Renal Assessment Handoff:  Hemodialysis Schedule: none Last Hemodialysis date and time:    Restricted appendage:     R Recommendations: See Admitting Provider Note  Report given to:   Additional Notes: rt bilary drain in place, small amount of drainage. Pt to IR tomorrow for investigation and possible replacement.

## 2022-08-24 NOTE — Progress Notes (Signed)
Brief progress note: -Patient was admitted earlier today. -As per H&P done earlier today: "Ellen Chandler is a 89 y.o. female with medical history significant for paroxysmal atrial fibrillation chronically anticoagulated on Eliquis, chronic diastolic heart failure, essential pretension, acquired hypothyroidism, who is admitted to [facility] on 08/23/2022 with complication relating to recently placed cholecystostomy tube after presenting from SNF to [facility] ED complaining of leakage around cholecystostomy tube.    The patient was recently hospitalized at [facility] from 07/24/2022 to 07/31/2022 for acute cholecystitis.  That hospitalization, she was felt to be a high risk surgical candidate, prompting election to pursue cholecystostomy tube as management of her acute cholecystitis.  She was subsequently discharged back to SNF on 07/31/2022.  However, over the course of the last day, as noted progressive leakage around her cholecystostomy tube, prompting her presentation to [facility] emergency department this evening for further evaluation management of such.   Aside from the leakage around her cholecystostomy tube, the patient is without any acute complaints.  Denies any worsening abdominal discomfort.  She also denies any subjective fever, chills, rigors, or generalized myalgias.  Denies any nausea, vomiting, diarrhea.  No recent chest pain, shortness of breath, palpitations, diaphoresis, dizziness, presyncope, or syncope.   Her medical history is notable for chronic diastolic heart failure as well as paroxysmal atrial fibrillation.  Most recent echocardiogram occurred on 07/09/2022 was notable for LVEF 55 to 60%, no focal wall motion maladies, grade 2 diastolic dysfunction, normal right ventricular systolic function, severely dilated left atrium as well as mild mitral regurgitation and mild aortic regurgitation.         ED Course:  Vital signs in the ED were notable for the following: Afebrile;  heart rate 57-62; systolic blood pressures in the low 100s to 120s; respiratory rate 14-22, oxygen saturation 97 to 98% on room air.   Labs were notable for the following: BMP notable for potassium 4.1, bicarbonate 29, creatinine 0.84.  CBC notable for open cell count 12,700 with 63% neutrophils and relative to blood cell count of 23,200 on 07/24/2022, hemoglobin 12.4.   Imaging and additional notable ED work-up: Plain film of the abdomen, performed radiology read, showed indeterminate location of the pigtail catheter, without any evidence of free air under the diaphragm or any evidence of bowel obstruction.   EDP discussed patient's case with the on-call interventional radiologist, who agreed to formally consult and is planning to exchange patient's cholecystostomy tube in the morning (4/19).   While in the ED, the following were administered: None   Subsequently, the patient was admitted for overnight observation for further evaluation management of presenting complication of existing cholecystostomy tube."   08/24/2022: Patient seen.  No new changes.  Patient is awaiting replacement of the cholecystostomy tube.  Interventional radiology input is highly appreciated. -Likely discharge tomorrow if patient remains stable.

## 2022-08-24 NOTE — Procedures (Signed)
  Procedure:  Exchange/reposition of cholecystostomy catheter upsize to 35F, brisk purulent output Preprocedure diagnosis: The primary encounter diagnosis was Infection of cholecystostomy drain, initial encounter. A diagnosis of RUQ pain was also pertinent to this visit. Postprocedure diagnosis: same EBL:    minimal Complications:   none immediate  See full dictation in [employer].  Thora Lance MD Main # [phone] Pager  [phone] Mobile [phone]

## 2022-08-25 DIAGNOSIS — T85518A Breakdown (mechanical) of other gastrointestinal prosthetic devices, implants and grafts, initial encounter: Secondary | ICD-10-CM | POA: Diagnosis not present

## 2022-08-25 MED ORDER — METOPROLOL SUCCINATE ER 25 MG PO TB24: 12.5000 mg | ORAL_TABLET | Freq: Every day | ORAL | 0 refills | Status: AC

## 2022-08-25 NOTE — Progress Notes (Signed)
PROGRESS NOTE    BRIGITT Chandler  ZOX:[id number] DOB: Jun 02, 1930 DOA: 08/23/2022 PCP: Eloisa Northern, MD  Outpatient Specialists:     Brief Narrative:  Patient is an 89 year old female past medical history significant for paroxysmal atrial fibrillation, chronic Eliquis use, chronic diastolic heart failure, hypertension, hypothyroidism and acute cholecystitis status post cholecystostomy tube placement.  Patient was admitted with cholecystostomy tube leak and interventional radiology team has managed the cholecystostomy tube accordingly.  Patient came from an assisted living facility, however, there are concerns that patient may not be able to go back to the assisted living facility.  Social worker has been consulted.  Patient will be discharged once disposition challenges are resolved.  No new complaints today.  Assessment & Plan:   Principal Problem:   Cholecystostomy tube dysfunction Active Problems:   Essential hypertension   Chronic diastolic CHF (congestive heart failure)   Acquired hypothyroidism   Paroxysmal A-fib   Cholecystostomy leak: -Input from IR team is appreciated. -Problem has been resolved. -Pursue disposition.  Acute cholecystitis: -Currently being managed supportively. -See above documentation.  Paroxysmal atrial fibrillation: -Continue current medication.  Chronic diastolic heart failure: -Compensated.  Hypertension: -Continue to monitor and optimize. -Goal blood pressure should be less than 130/80 mmHg.  Hypothyroidism: -Continue Synthroid 88 mcg p.o. once daily.   DVT prophylaxis: Low threshold to resume Eliquis 5 Mg p.o. twice daily. Code Status: DO NOT RESUSCITATE Family Communication:  Disposition Plan: Social worker is looking at options.   Consultants:  IR  Procedures:  Exchange/repositioning of cholecystostomy catheter.  Antimicrobials:  None   Subjective: No new complaints.  Objective: Vitals:   08/25/22 0453 08/25/22 0500  08/25/22 0803 08/25/22 1431  BP: (!) 152/77  (!) 168/64 (!) 158/76  Pulse: (!) 59  (!) 59 (!) 58  Resp: 19  16 15   Temp: 98 F (36.7 C)  98.1 F (36.7 C) 98.1 F (36.7 C)  TempSrc:   Oral Oral  SpO2: 100%  98% 100%  Weight:  61.8 kg    Height:        Intake/Output Summary (Last 24 hours) at 08/25/2022 1613 Last data filed at 08/25/2022 0800 Gross per 24 hour  Intake 495 ml  Output --  Net 495 ml   Filed Weights   08/23/22 1601 08/24/22 0320 08/25/22 0500  Weight: 63 kg 64.4 kg 61.8 kg    Examination:  General exam: Appears calm and comfortable  Respiratory system: Clear to auscultation.   Cardiovascular system: S1 & S2 heard  Gastrointestinal system: Abdomen is soft and nontender.  Right-sided cholecystostomy tube is draining.   Central nervous system: Alert and oriented.  Patient moves all extremities..  Data Reviewed: I have personally reviewed following labs and imaging studies  CBC: Recent Labs  Lab 08/23/22 1609 08/24/22 0130  WBC 12.7* 12.3*  NEUTROABS 8.0* 9.0*  HGB 12.4 11.6*  HCT 37.4 35.5*  MCV 93.7 94.9  PLT 397 388   Basic Metabolic Panel: Recent Labs  Lab 08/23/22 1609 08/23/22 2246 08/24/22 0130  NA 135 132* 128*  K 2.7* 4.1 3.9  CL 105 91* 91*  CO2 25 29 30   GLUCOSE 73 94 93  BUN 14 18 17   CREATININE 0.60 0.84 0.84  CALCIUM 5.7* 8.4* 8.1*  MG  --   --  1.9   GFR: Estimated Creatinine Clearance: 40.9 mL/min (by C-G formula based on SCr of 0.84 mg/dL). Liver Function Tests: Recent Labs  Lab 08/23/22 1609 08/24/22 0130  AST 23 28  ALT  27 34  ALKPHOS 40 58  BILITOT 0.2* 0.6  PROT 3.7* 5.4*  ALBUMIN <1.5* 2.2*   No results for input(s): "LIPASE", "AMYLASE" in the last 168 hours. No results for input(s): "AMMONIA" in the last 168 hours. Coagulation Profile: Recent Labs  Lab 08/24/22 0130  INR 1.4*   Cardiac Enzymes: No results for input(s): "CKTOTAL", "CKMB", "CKMBINDEX", "TROPONINI" in the last 168 hours. BNP (last 3  results) No results for input(s): "PROBNP" in the last 8760 hours. HbA1C: No results for input(s): "HGBA1C" in the last 72 hours. CBG: No results for input(s): "GLUCAP" in the last 168 hours. Lipid Profile: No results for input(s): "CHOL", "HDL", "LDLCALC", "TRIG", "CHOLHDL", "LDLDIRECT" in the last 72 hours. Thyroid Function Tests: No results for input(s): "TSH", "T4TOTAL", "FREET4", "T3FREE", "THYROIDAB" in the last 72 hours. Anemia Panel: No results for input(s): "VITAMINB12", "FOLATE", "FERRITIN", "TIBC", "IRON", "RETICCTPCT" in the last 72 hours. Urine analysis:    Component Value Date/Time   COLORURINE YELLOW 02/14/2021 1815   APPEARANCEUR CLEAR 02/14/2021 1815   LABSPEC 1.011 02/14/2021 1815   PHURINE 5.0 02/14/2021 1815   GLUCOSEU NEGATIVE 02/14/2021 1815   HGBUR SMALL (A) 02/14/2021 1815   BILIRUBINUR NEGATIVE 02/14/2021 1815   KETONESUR NEGATIVE 02/14/2021 1815   PROTEINUR NEGATIVE 02/14/2021 1815   UROBILINOGEN 0.2 05/19/2014 0635   NITRITE NEGATIVE 02/14/2021 1815   LEUKOCYTESUR NEGATIVE 02/14/2021 1815   Sepsis Labs: (procalcitonin:4,lacticidven:4)  )No results found for this or any previous visit (from the past 240 hour(s)).       Radiology Studies: IR CHOLANGIOGRAM EXISTING TUBE  Result Date: 08/25/2022 INDICATION: Pain at cholecystectomy cholecystostomy catheter site with drainage EXAM: EXCHANGE OF CHOLECYSTOSTOMY CATHETER UNDER FLUOROSCOPY MEDICATIONS: no periprocedural antibiotics were indicated ANESTHESIA/SEDATION: Lidocaine 1% subcutaneous FLUOROSCOPY: Radiation Exposure Index (as provided by the fluoroscopic device): 4 MGy Kerma COMPLICATIONS: None immediate. PROCEDURE: Informed written consent was obtained from the patient after a thorough discussion of the procedural risks, benefits and alternatives. All questions were addressed. Maximal Sterile Barrier Technique was utilized including caps, mask, sterile gowns, sterile gloves, sterile drape,  hand hygiene and skin antiseptic. A timeout was performed prior to the initiation of the procedure. Cholecystostomy catheter and surrounding skin prepped with chlorhexidine, draped in usual sterile fashion. Contrast injection showed the catheter to be at the inferior margin of the gallbladder. No extravasation. Cystic duct not visualized. The catheter was cut and exchanged over an Amplatz wire for a new 12 French pigtail drain catheter, formed more centrally in the lumen of the gallbladder. Upon catheter repositioning, fairly copious amounts of purulent appearing material returned through the catheter. The catheter was secured externally with 0 Prolene suture and StatLock and placed to gravity drain bag. The patient tolerated the procedure well. IMPRESSION: 1. Successful revision of 6 French cholecystostomy catheter, with fluoroscopic guidance. Electronically Signed   By: Corlis Leak M.D.   On: 08/25/2022 08:37   DG Abd Portable 1 View  Result Date: 08/23/2022 CLINICAL DATA:  Drain placement EXAM: PORTABLE ABDOMEN - 1 VIEW COMPARISON:  X-ray 09/20/2017. Drain placement CT procedure 07/26/2022 FINDINGS: Gas is seen in nondilated loops of small and large bowel. Scattered colonic stool. The diaphragm in the left hemi abdominal edge are clipped off the edge of the film. The film is rotated to the left. Pigtail catheter overlies the right upper quadrant of the abdomen. This very well could be in the gallbladder. However evaluation is limited by x-ray and if needed a CT or ultrasound could be considered IMPRESSION: Nonspecific  bowel gas pattern. Pigtail catheter in the right upper quadrant could be within the gallbladder but indeterminate on this 1 portable view x-ray. If needed confirmatory ultrasound or CT could be considered Electronically Signed   By: Karen Kays M.D.   On: 08/23/2022 16:54        Scheduled Meds:  amiodarone  200 mg Oral q AM   apixaban  5 mg Oral BID   levothyroxine  88 mcg Oral QAC  breakfast   metoprolol succinate  12.5 mg Oral Daily   sodium chloride flush  5 mL Intracatheter Q8H   Continuous Infusions:   LOS: 0 days    Time spent: 35 minutes.    Berton Mount, MD  [facility] Pager #: [phone] 7PM-7AM contact night coverage as above

## 2022-08-25 NOTE — TOC Progression Note (Signed)
Transition of Care [facility]) - Progression Note    Patient Details  Name: Ellen Chandler MRN: [id number] Date of Birth: 1930/10/08  Transition of Care [facility]) CM/SW Contact  Keelyn Fjelstad [place], [place] Phone Number: 08/25/2022, 4:22 PM  Clinical Narrative:    Phone call to [facility] to follow up on patient's return, VM left for [place] and [place] requesting a return call.  Also contacted the facility-spoke with [facility] who made efforts to contact admissions, however they were not able to provide a definite response.  Phone call to patient's daughter in law to discuss discharge plan. Confirmed that patient is in co-pay days, however they are willing to pay the co-pay until a long term care facility can be identified. Plan remains that patient will return to [facility] at discharge.   Verna Czech, [place] Clinical Social Worker  [facility] Care Management [phone]        Expected Discharge Plan and Services         Expected Discharge Date: 08/25/22                                     Social Determinants of Health (SDOH) Interventions SDOH Screenings   Food Insecurity: No Food Insecurity (08/23/2022)  Housing: Low Risk  (08/23/2022)  Transportation Needs: No Transportation Needs (08/23/2022)  Utilities: Not At Risk (08/23/2022)  Alcohol Screen: Low Risk  (07/10/2022)  Financial Resource Strain: Low Risk  (07/10/2022)  Tobacco Use: Low Risk  (08/25/2022)    Readmission Risk Interventions    07/27/2022    2:36 PM  Readmission Risk Prevention Plan  Transportation Screening Complete  PCP or Specialist Appt within 3-5 Days Complete  HRI or Home Care Consult Complete  Social Work Consult for Recovery Care Planning/Counseling Complete  Palliative Care Screening Not Applicable  Medication Review Oceanographer) Complete

## 2022-08-26 DIAGNOSIS — T85518A Breakdown (mechanical) of other gastrointestinal prosthetic devices, implants and grafts, initial encounter: Secondary | ICD-10-CM | POA: Diagnosis not present

## 2022-08-26 NOTE — TOC Progression Note (Signed)
Transition of Care [facility]) - Progression Note    Patient Details  Name: Ellen Chandler MRN: [id number] Date of Birth: Sep 18, 1930  Transition of Care [facility]) CM/SW Contact  Olivette Beckmann, [place], [place] Phone Number: 08/26/2022, 9:10 AM  Clinical Narrative:    Phone call from [place] at [facility], patient will need prior authorization before returning. [employer] contacted, authorization started with [facility].  Verna Czech, [place] Clinical Social Worker  [facility] Care Management [phone]         Expected Discharge Plan and Services         Expected Discharge Date: 08/25/22                                     Social Determinants of Health (SDOH) Interventions SDOH Screenings   Food Insecurity: No Food Insecurity (08/23/2022)  Housing: Low Risk  (08/23/2022)  Transportation Needs: No Transportation Needs (08/23/2022)  Utilities: Not At Risk (08/23/2022)  Alcohol Screen: Low Risk  (07/10/2022)  Financial Resource Strain: Low Risk  (07/10/2022)  Tobacco Use: Low Risk  (08/25/2022)    Readmission Risk Interventions    07/27/2022    2:36 PM  Readmission Risk Prevention Plan  Transportation Screening Complete  PCP or Specialist Appt within 3-5 Days Complete  HRI or Home Care Consult Complete  Social Work Consult for Recovery Care Planning/Counseling Complete  Palliative Care Screening Not Applicable  Medication Review Oceanographer) Complete

## 2022-08-26 NOTE — TOC Progression Note (Signed)
Transition of Care [facility]) - Progression Note    Patient Details  Name: Ellen Chandler MRN: [id number] Date of Birth: 1930/09/20  Transition of Care [facility]) CM/SW Contact  [address] [place], [place] Phone Number: 08/26/2022, 3:50 PM  Clinical Narrative:    Follow up call to [facility] from [facility] regarding insurance authorization. Authorization remains pending at this time.  Transition of Care to continue to follow   Pieter Fooks, LCSW Transition of Care          Expected Discharge Plan and Services         Expected Discharge Date: 08/25/22                                     Social Determinants of Health (SDOH) Interventions SDOH Screenings   Food Insecurity: No Food Insecurity (08/23/2022)  Housing: Low Risk  (08/23/2022)  Transportation Needs: No Transportation Needs (08/23/2022)  Utilities: Not At Risk (08/23/2022)  Alcohol Screen: Low Risk  (07/10/2022)  Financial Resource Strain: Low Risk  (07/10/2022)  Tobacco Use: Low Risk  (08/25/2022)    Readmission Risk Interventions    07/27/2022    2:36 PM  Readmission Risk Prevention Plan  Transportation Screening Complete  PCP or Specialist Appt within 3-5 Days Complete  HRI or Home Care Consult Complete  Social Work Consult for Recovery Care Planning/Counseling Complete  Palliative Care Screening Not Applicable  Medication Review Oceanographer) Complete

## 2022-08-26 NOTE — Evaluation (Signed)
Physical Therapy Evaluation Patient Details Name: Ellen Chandler MRN: [id number] DOB: 25-Jul-1930 Today's Date: 08/26/2022  History of Present Illness  89 y.o. female presents from [facility] SN[facility] to [facility] hospital on 08/23/2022 for leakage around cholecystostomy tube and RUQ pain. Pt found to have infection of cholecystostomy drain.  S/p 4/19 exchange/reposition of cholecystostomy catheter in radiology  PMH includes CHF, HTN, hypothyroidism, PAF.  Clinical Impression  Pt residing at [facility] immediately prior to hospitalization, where pt reports she was working with therapy and was able to walk around her room with rollator and min assist from the therapist. Pt is limited in safe mobility by generalized weakness from 2 days in the bed and associated decreased balance and endurance. PT recommending pt return to [facility] as soon as possible to return to her therapy sessions. Pt hopeful for discharge today.        Recommendations for follow up therapy are one component of a multi-disciplinary discharge planning process, led by the attending physician.  Recommendations may be updated based on patient status, additional functional criteria and insurance authorization.  Follow Up Recommendations Can patient physically be transported by private vehicle: No     Assistance Recommended at Discharge Frequent or constant Supervision/Assistance  Patient can return home with the following  A lot of help with walking and/or transfers;A lot of help with bathing/dressing/bathroom;Assistance with cooking/housework;Direct supervision/assist for medications management;Direct supervision/assist for financial management;Assist for transportation;Help with stairs or ramp for entrance    Equipment Recommendations None recommended by PT     Functional Status Assessment Patient has had a recent decline in their functional status and demonstrates the ability to make significant improvements in function in a reasonable  and predictable amount of time.     Precautions / Restrictions Precautions Precautions: Fall Restrictions Weight Bearing Restrictions: No      Mobility  Bed Mobility Overal bed mobility: Needs Assistance Bed Mobility: Supine to Sit     Supine to sit: Mod assist     General bed mobility comments: pt able to come to seated with HoB elevated, however unable to scoot hips to EoB without modA for pad scoot of hips    Transfers Overall transfer level: Needs assistance Equipment used: Rolling walker (2 wheels) Transfers: Sit to/from Stand, Bed to chair/wheelchair/BSC Sit to Stand: Mod assist   Step pivot transfers: Min assist       General transfer comment: pt requiring modA for power up and steadying at RW, vc for increased forward lean and upright posture for power up, once up in standing only requires min A for stepping transfer to recliner, pt refusing further ambulation as lunch has just come.          Balance Overall balance assessment: Needs assistance Sitting-balance support: Feet unsupported, Bilateral upper extremity supported, Single extremity supported Sitting balance-Leahy Scale: Fair     Standing balance support: Bilateral upper extremity supported, During functional activity, Reliant on assistive device for balance Standing balance-Leahy Scale: Poor                               Pertinent Vitals/Pain Pain Assessment Pain Assessment: Faces Faces Pain Scale: Hurts little more Pain Location: R flank with movement Pain Descriptors / Indicators: Grimacing, Guarding, Moaning Pain Intervention(s): Limited activity within patient's tolerance, Monitored during session, Repositioned    Home Living Family/patient expects to be discharged to:: Assisted living  Additional Comments: [facility]    Prior Function Prior Level of Function : Needs assist             Mobility Comments: using Rollator for in room ambulation  with assist of 1 ADLs Comments: requires assist for ADLs and IADLS.     Hand Dominance   Dominant Hand: Right    Extremity/Trunk Assessment   Upper Extremity Assessment Upper Extremity Assessment: Overall WFL for tasks assessed;Generalized weakness    Lower Extremity Assessment Lower Extremity Assessment: Overall WFL for tasks assessed;Generalized weakness    Cervical / Trunk Assessment Cervical / Trunk Assessment: Kyphotic  Communication   Communication: No difficulties  Cognition Arousal/Alertness: Awake/alert Behavior During Therapy: WFL for tasks assessed/performed Overall Cognitive Status: Within Functional Limits for tasks assessed                                          General Comments General comments (skin integrity, edema, etc.): Pt on 2L O2 via [place] on entry, with SpO2 97%O2, pt reports she does not use O2 at baseline and is able to maintain SpO2 >94%O2        Assessment/Plan    PT Assessment Patient needs continued PT services  PT Problem List Decreased strength;Decreased activity tolerance;Decreased balance;Decreased mobility       PT Treatment Interventions DME instruction;Gait training;Functional mobility training;Therapeutic activities;Therapeutic exercise;Balance training;Cognitive remediation;Patient/family education    PT Goals (Current goals can be found in the Care Plan section)  Acute Rehab PT Goals Patient Stated Goal: get back to [facility] PT Goal Formulation: With patient Time For Goal Achievement: 09/09/22 Potential to Achieve Goals: Fair    Frequency Min 3X/week        AM-PAC PT "6 Clicks" Mobility  Outcome Measure Help needed turning from your back to your side while in a flat bed without using bedrails?: None Help needed moving from lying on your back to sitting on the side of a flat bed without using bedrails?: A Lot Help needed moving to and from a bed to a chair (including a wheelchair)?: A Lot Help needed  standing up from a chair using your arms (e.g., wheelchair or bedside chair)?: A Lot Help needed to walk in hospital room?: A Little Help needed climbing 3-5 steps with a railing? : Total 6 Click Score: 14    End of Session Equipment Utilized During Treatment: Gait belt Activity Tolerance: Patient tolerated treatment well Patient left: in chair;with call bell/phone within reach;with chair alarm set Nurse Communication: Mobility status PT Visit Diagnosis: Unsteadiness on feet (R26.81);Muscle weakness (generalized) (M62.81);Difficulty in walking, not elsewhere classified (R26.2)    Time: 1415-1450 PT Time Calculation (min) (ACUTE ONLY): 35 min   Charges:   PT Evaluation $PT Eval Moderate Complexity: 1 Mod PT Treatments $Therapeutic Activity: 8-22 mins        Ayrianna Mcginniss B. Beverely Risen PT, DPT Acute Rehabilitation Services Please use secure chat or  Call Office [phone]   Elon Alas [facility] 08/26/2022, 2:58 PM

## 2022-08-26 NOTE — Progress Notes (Signed)
PROGRESS NOTE    Ellen Chandler  ZOX:[id number] DOB: Sep 20, 1930 DOA: 08/23/2022 PCP: Eloisa Northern, MD  Outpatient Specialists:     Brief Narrative:  Patient is an 89 year old female past medical history significant for paroxysmal atrial fibrillation, chronic Eliquis use, chronic diastolic heart failure, hypertension, hypothyroidism and acute cholecystitis status post cholecystostomy tube placement.  Patient was admitted with cholecystostomy tube leak and interventional radiology team has managed the cholecystostomy tube accordingly.  Patient came from an assisted living facility, however, there are concerns that patient may not be able to go back to the assisted living facility.  Social worker has been consulted.  Patient will be discharged once disposition challenges are resolved.  No new complaints today.  08/26/2022: Patient seen.  No new complaints.  Awaiting discharge.  Physical therapy team consulted.  Transition of care team is assisting with discharge.  Assessment & Plan:   Principal Problem:   Cholecystostomy tube dysfunction Active Problems:   Essential hypertension   Chronic diastolic CHF (congestive heart failure)   Acquired hypothyroidism   Paroxysmal A-fib   Cholecystostomy leak: -Input from IR team is appreciated. -Cholecystostomy tube has been changed and repositioned.   -Pursue disposition.  Acute cholecystitis: -Currently being managed supportively. -See above documentation.  Paroxysmal atrial fibrillation: -Continue current medication.  Chronic diastolic heart failure: -Compensated.  Hypertension: -Continue to monitor and optimize. -Goal blood pressure should be less than 130/80 mmHg.  Hypothyroidism: -Continue Synthroid 88 mcg p.o. once daily.   DVT prophylaxis: Low threshold to resume Eliquis 5 Mg p.o. twice daily. Code Status: DO NOT RESUSCITATE Family Communication:  Disposition Plan: Social worker is looking at options.   Consultants:   IR  Procedures:  Exchange/repositioning of cholecystostomy catheter.  Antimicrobials:  None   Subjective: No new complaints.  Objective: Vitals:   08/25/22 2118 08/26/22 0610 08/26/22 0706 08/26/22 1425  BP: (!) 138/55 (!) 150/56  (!) 156/66  Pulse:  (!) 52  (!) 58  Resp:      Temp: 98 F (36.7 C) 99.8 F (37.7 C)  97.8 F (36.6 C)  TempSrc: Oral Oral  Oral  SpO2: 98% 99% 96% 94%  Weight:      Height:        Intake/Output Summary (Last 24 hours) at 08/26/2022 1920 Last data filed at 08/26/2022 1045 Gross per 24 hour  Intake 40 ml  Output 100 ml  Net -60 ml    Filed Weights   08/23/22 1601 08/24/22 0320 08/25/22 0500  Weight: 63 kg 64.4 kg 61.8 kg    Examination:  General exam: Appears calm and comfortable  Respiratory system: Clear to auscultation.   Cardiovascular system: S1 & S2 heard  Gastrointestinal system: Abdomen is soft and nontender.  Right-sided cholecystostomy tube is draining.   Central nervous system: Alert and oriented.  Patient moves all extremities..  Data Reviewed: I have personally reviewed following labs and imaging studies  CBC: Recent Labs  Lab 08/23/22 1609 08/24/22 0130  WBC 12.7* 12.3*  NEUTROABS 8.0* 9.0*  HGB 12.4 11.6*  HCT 37.4 35.5*  MCV 93.7 94.9  PLT 397 388    Basic Metabolic Panel: Recent Labs  Lab 08/23/22 1609 08/23/22 2246 08/24/22 0130  NA 135 132* 128*  K 2.7* 4.1 3.9  CL 105 91* 91*  CO2 GLUCOSE 73 94 93  BUN CREATININE 0.60 0.84 0.84  CALCIUM 5.7* 8.4* 8.1*  MG  --   --  1.9  GFR: Estimated Creatinine Clearance: 40.9 mL/min (by C-G formula based on SCr of 0.84 mg/dL). Liver Function Tests: Recent Labs  Lab 08/23/22 1609 08/24/22 0130  AST 23 28  ALT 27 34  ALKPHOS 40 58  BILITOT 0.2* 0.6  PROT 3.7* 5.4*  ALBUMIN <1.5* 2.2*    No results for input(s): "LIPASE", "AMYLASE" in the last 168 hours. No results for input(s): "AMMONIA" in the last 168  hours. Coagulation Profile: Recent Labs  Lab 08/24/22 0130  INR 1.4*    Cardiac Enzymes: No results for input(s): "CKTOTAL", "CKMB", "CKMBINDEX", "TROPONINI" in the last 168 hours. BNP (last 3 results) No results for input(s): "PROBNP" in the last 8760 hours. HbA1C: No results for input(s): "HGBA1C" in the last 72 hours. CBG: No results for input(s): "GLUCAP" in the last 168 hours. Lipid Profile: No results for input(s): "CHOL", "HDL", "LDLCALC", "TRIG", "CHOLHDL", "LDLDIRECT" in the last 72 hours. Thyroid Function Tests: No results for input(s): "TSH", "T4TOTAL", "FREET4", "T3FREE", "THYROIDAB" in the last 72 hours. Anemia Panel: No results for input(s): "VITAMINB12", "FOLATE", "FERRITIN", "TIBC", "IRON", "RETICCTPCT" in the last 72 hours. Urine analysis:    Component Value Date/Time   COLORURINE YELLOW 02/14/2021 1815   APPEARANCEUR CLEAR 02/14/2021 1815   LABSPEC 1.011 02/14/2021 1815   PHURINE 5.0 02/14/2021 1815   GLUCOSEU NEGATIVE 02/14/2021 1815   HGBUR SMALL (A) 02/14/2021 1815   BILIRUBINUR NEGATIVE 02/14/2021 1815   KETONESUR NEGATIVE 02/14/2021 1815   PROTEINUR NEGATIVE 02/14/2021 1815   UROBILINOGEN 0.2 05/19/2014 0635   NITRITE NEGATIVE 02/14/2021 1815   LEUKOCYTESUR NEGATIVE 02/14/2021 1815   Sepsis Labs: (procalcitonin:4,lacticidven:4)  )No results found for this or any previous visit (from the past 240 hour(s)).       Radiology Studies: IR CHOLANGIOGRAM EXISTING TUBE  Result Date: 08/25/2022 INDICATION: Pain at cholecystectomy cholecystostomy catheter site with drainage EXAM: EXCHANGE OF CHOLECYSTOSTOMY CATHETER UNDER FLUOROSCOPY MEDICATIONS: no periprocedural antibiotics were indicated ANESTHESIA/SEDATION: Lidocaine 1% subcutaneous FLUOROSCOPY: Radiation Exposure Index (as provided by the fluoroscopic device): 4 MGy Kerma COMPLICATIONS: None immediate. PROCEDURE: Informed written consent was obtained from the patient after a thorough  discussion of the procedural risks, benefits and alternatives. All questions were addressed. Maximal Sterile Barrier Technique was utilized including caps, mask, sterile gowns, sterile gloves, sterile drape, hand hygiene and skin antiseptic. A timeout was performed prior to the initiation of the procedure. Cholecystostomy catheter and surrounding skin prepped with chlorhexidine, draped in usual sterile fashion. Contrast injection showed the catheter to be at the inferior margin of the gallbladder. No extravasation. Cystic duct not visualized. The catheter was cut and exchanged over an Amplatz wire for a new 12 French pigtail drain catheter, formed more centrally in the lumen of the gallbladder. Upon catheter repositioning, fairly copious amounts of purulent appearing material returned through the catheter. The catheter was secured externally with 0 Prolene suture and StatLock and placed to gravity drain bag. The patient tolerated the procedure well. IMPRESSION: 1. Successful revision of 20 French cholecystostomy catheter, with fluoroscopic guidance. Electronically Signed   By: Corlis Leak M.D.   On: 08/25/2022 08:37        Scheduled Meds:  amiodarone  200 mg Oral q AM   apixaban  5 mg Oral BID   levothyroxine  88 mcg Oral QAC breakfast   metoprolol succinate  12.5 mg Oral Daily   sodium chloride flush  5 mL Intracatheter Q8H   Continuous Infusions:   LOS: 0 days    Time spent: 35 minutes.  Dana Allan, MD  [facility] Pager #: [phone] 7PM-7AM contact night coverage as above

## 2022-08-27 ENCOUNTER — Encounter (HOSPITAL_COMMUNITY): Payer: Self-pay

## 2022-08-27 DIAGNOSIS — T85518A Breakdown (mechanical) of other gastrointestinal prosthetic devices, implants and grafts, initial encounter: Secondary | ICD-10-CM | POA: Diagnosis not present

## 2022-08-27 DIAGNOSIS — I5032 Chronic diastolic (congestive) heart failure: Secondary | ICD-10-CM | POA: Diagnosis not present

## 2022-08-27 NOTE — Progress Notes (Signed)
PROGRESS NOTE    Ellen Chandler  ZOX:[id number] DOB: 05/17/1930 DOA: 08/23/2022 PCP: Eloisa Northern, MD   Brief Narrative:  89 year old female past medical history significant for paroxysmal atrial fibrillation, chronic Eliquis use, chronic diastolic heart failure, hypertension, hypothyroidism and acute cholecystitis status post cholecystostomy tube placement was admitted with cholecystostomy tube leak.  IR did exchange/repositioning of cholecystostomy tube on 08/24/2022.  PT recommended SNF placement.  Currently medically stable for discharge to SNF.  Assessment & Plan:   Cholecystostomy tube leak/malfunction History of acute cholecystitis status post cholecystostomy tube placement -admitted with cholecystostomy tube leak.  IR did exchange/repositioning of cholecystostomy tube on 08/24/2022.  Follow-up with IR regarding further care for cholecystostomy tube  Generalized deconditioning -PT recommended SNF placement.  Currently medically stable for discharge to SNF.  TOC following  Paroxysmal A-fib From mild intermittent bradycardia present.  Continue Eliquis, amiodarone and metoprolol succinate  Hypothyroidism -Continue levothyroxine  Chronic diastolic heart failure -Strict input and output.  Daily weights.  Currently compensated.  Hyponatremia -Sodium was 128 on 08/24/2022.  No labs since then.  Repeat a.m. labs  Leukocytosis -No recent labs.  Repeat a.m. labs  Normocytic anemia/anemia of chronic disease -Hemoglobin stable.  Monitor intermittently  Hypertension -Monitor blood pressure.  Continue metoprolol succinate    DVT prophylaxis: Eliquis Code Status: DNR Family Communication: None at bedside Disposition Plan: Status is: Observation The patient will require care spanning > 2 midnights and should be moved to inpatient because: Of severity of illness.  Need for SNF placement.    Consultants: IR  Procedures: As above  Antimicrobials: None   Subjective: Patient  seen and examined at bedside.  Slow to respond, poor historian.  Denies worsening abdominal pain, fever or vomiting.  Objective: Vitals:   08/26/22 1957 08/27/22 0424 08/27/22 0500 08/27/22 0722  BP: (!) 144/77 (!) 140/65  (!) 151/57  Pulse: 62 (!) 57  (!) 54  Resp: 18 18    Temp: 97.8 F (36.6 C) 97.8 F (36.6 C)  97.8 F (36.6 C)  TempSrc: Oral Oral  Oral  SpO2: 98% 99%  98%  Weight:   63 kg   Height:        Intake/Output Summary (Last 24 hours) at 08/27/2022 1050 Last data filed at 08/27/2022 0900 Gross per 24 hour  Intake 5 ml  Output 0 ml  Net 5 ml   Filed Weights   08/24/22 0320 08/25/22 0500 08/27/22 0500  Weight: 64.4 kg 61.8 kg 63 kg    Examination:  General exam: Appears calm and comfortable.  No distress.  Elderly female lying in bed.  On room air.  Slow to respond.  Flat affect.  Poor historian. Respiratory system: Bilateral decreased breath sounds at bases with scattered crackles Cardiovascular system: S1 & S2 heard, Rate controlled Gastrointestinal system: Abdomen is nondistended, soft and nontender. Normal bowel sounds heard.  Right upper quadrant cholecystostomy tube present Extremities: No cyanosis, clubbing; trace lower extremity edema present  Data Reviewed: I have personally reviewed following labs and imaging studies  CBC: Recent Labs  Lab 08/23/22 1609 08/24/22 0130  WBC 12.7* 12.3*  NEUTROABS 8.0* 9.0*  HGB 12.4 11.6*  HCT 37.4 35.5*  MCV 93.7 94.9  PLT 397 388   Basic Metabolic Panel: Recent Labs  Lab 08/23/22 1609 08/23/22 2246 08/24/22 0130  NA 135 132* 128*  K 2.7* 4.1 3.9  CL 105 91* 91*  CO2 GLUCOSE 73 94 93  BUN CREATININE  0.60 0.84 0.84  CALCIUM 5.7* 8.4* 8.1*  MG  --   --  1.9   GFR: Estimated Creatinine Clearance: 40.9 mL/min (by C-G formula based on SCr of 0.84 mg/dL). Liver Function Tests: Recent Labs  Lab 08/23/22 1609 08/24/22 0130  AST 23 28  ALT 27 34  ALKPHOS 40 58  BILITOT 0.2* 0.6   PROT 3.7* 5.4*  ALBUMIN <1.5* 2.2*   No results for input(s): "LIPASE", "AMYLASE" in the last 168 hours. No results for input(s): "AMMONIA" in the last 168 hours. Coagulation Profile: Recent Labs  Lab 08/24/22 0130  INR 1.4*   Cardiac Enzymes: No results for input(s): "CKTOTAL", "CKMB", "CKMBINDEX", "TROPONINI" in the last 168 hours. BNP (last 3 results) No results for input(s): "PROBNP" in the last 8760 hours. HbA1C: No results for input(s): "HGBA1C" in the last 72 hours. CBG: No results for input(s): "GLUCAP" in the last 168 hours. Lipid Profile: No results for input(s): "CHOL", "HDL", "LDLCALC", "TRIG", "CHOLHDL", "LDLDIRECT" in the last 72 hours. Thyroid Function Tests: No results for input(s): "TSH", "T4TOTAL", "FREET4", "T3FREE", "THYROIDAB" in the last 72 hours. Anemia Panel: No results for input(s): "VITAMINB12", "FOLATE", "FERRITIN", "TIBC", "IRON", "RETICCTPCT" in the last 72 hours. Sepsis Labs: No results for input(s): "PROCALCITON", "LATICACIDVEN" in the last 168 hours.  No results found for this or any previous visit (from the past 240 hour(s)).       Radiology Studies: No results found.      Scheduled Meds:  amiodarone  200 mg Oral q AM   apixaban  5 mg Oral BID   levothyroxine  88 mcg Oral QAC breakfast   metoprolol succinate  12.5 mg Oral Daily   sodium chloride flush  5 mL Intracatheter Q8H   Continuous Infusions:        Glade Lloyd, MD [facility] 08/27/2022, 10:50 AM

## 2022-08-27 NOTE — TOC Progression Note (Addendum)
Transition of Care [facility]) - Progression Note    Patient Details  Name: Ellen Chandler MRN: [id number] Date of Birth: 1930-08-04  Transition of Care [facility]) CM/SW Contact  Lorri Frederick, LCSW Phone Number: 08/27/2022, 11:23 AM  Clinical Narrative:   CSW spoke with Connie/HTA.  Auth request still pending, no decision.    Email from [employer]/financial counseling. Medicaid screening was completed, she does plan to submit medicaid application.  Son and DIL reviewing paperwork prior to having pt sign.    1340: TC Connie/HTA.  SNF auth denied.  Per Dr Logan Bores, pt is at baseline and requires LTC.  PTAR approved: [id number].  Peer to peer offered, call Dr Logan Bores [phone] before noon 4/23.     CSW spoke with DIL [name], she is quite upset, does not know what they will do, stating they do not have funds for LTC and that they are not able to bring pt to their home and care for her.  Medicaid application has been completed and submitted.  [name] does want to pursue SNF appeal.    MD notified about peer to peer.     Expected Discharge Plan and Services         Expected Discharge Date: 08/25/22                                     Social Determinants of Health (SDOH) Interventions SDOH Screenings   Food Insecurity: No Food Insecurity (08/23/2022)  Housing: Low Risk  (08/23/2022)  Transportation Needs: No Transportation Needs (08/23/2022)  Utilities: Not At Risk (08/23/2022)  Alcohol Screen: Low Risk  (07/10/2022)  Financial Resource Strain: Low Risk  (07/10/2022)  Tobacco Use: Low Risk  (08/27/2022)    Readmission Risk Interventions    07/27/2022    2:36 PM  Readmission Risk Prevention Plan  Transportation Screening Complete  PCP or Specialist Appt within 3-5 Days Complete  HRI or Home Care Consult Complete  Social Work Consult for Recovery Care Planning/Counseling Complete  Palliative Care Screening Not Applicable  Medication Review [employer]) Complete

## 2022-08-28 DIAGNOSIS — T85518A Breakdown (mechanical) of other gastrointestinal prosthetic devices, implants and grafts, initial encounter: Secondary | ICD-10-CM | POA: Diagnosis not present

## 2022-08-28 DIAGNOSIS — I5032 Chronic diastolic (congestive) heart failure: Secondary | ICD-10-CM | POA: Diagnosis not present

## 2022-08-28 LAB — COMPREHENSIVE METABOLIC PANEL
ALT: 27 U/L (ref 0–44)
AST: 26 U/L (ref 15–41)
Albumin: 2.3 g/dL — ABNORMAL LOW (ref 3.5–5.0)
Alkaline Phosphatase: 46 U/L (ref 38–126)
Anion gap: 10 (ref 5–15)
BUN: 20 mg/dL (ref 8–23)
CO2: 25 mmol/L (ref 22–32)
Calcium: 8.6 mg/dL — ABNORMAL LOW (ref 8.9–10.3)
Chloride: 92 mmol/L — ABNORMAL LOW (ref 98–111)
Creatinine, Ser: 0.76 mg/dL (ref 0.44–1.00)
GFR, Estimated: >60 mL/min (ref >60)
Glucose, Bld: 99 mg/dL (ref 70–99)
Potassium: 3.8 mmol/L (ref 3.5–5.1)
Sodium: 127 mmol/L — ABNORMAL LOW (ref 135–145)
Total Bilirubin: 0.5 mg/dL (ref 0.3–1.2)
Total Protein: 5.5 g/dL — ABNORMAL LOW (ref 6.5–8.1)

## 2022-08-28 LAB — CBC WITH DIFFERENTIAL/PLATELET
Abs Immature Granulocytes: 0.22 10*3/uL — ABNORMAL HIGH (ref 0.00–0.07)
Basophils Absolute: 0.1 10*3/uL (ref 0.0–0.1)
Basophils Relative: 1 %
Eosinophils Absolute: 0.1 10*3/uL (ref 0.0–0.5)
Eosinophils Relative: 1 %
HCT: 34.7 % — ABNORMAL LOW (ref 36.0–46.0)
Hemoglobin: 11.9 g/dL — ABNORMAL LOW (ref 12.0–15.0)
Immature Granulocytes: 2 %
Lymphocytes Relative: 20 %
Lymphs Abs: 2.4 10*3/uL (ref 0.7–4.0)
MCH: 31.4 pg (ref 26.0–34.0)
MCHC: 34.3 g/dL (ref 30.0–36.0)
MCV: 91.6 fL (ref 80.0–100.0)
Monocytes Absolute: 1.4 10*3/uL — ABNORMAL HIGH (ref 0.1–1.0)
Monocytes Relative: 12 %
Neutro Abs: 7.6 10*3/uL (ref 1.7–7.7)
Neutrophils Relative %: 64 %
Platelets: 365 10*3/uL (ref 150–400)
RBC: 3.79 MIL/uL — ABNORMAL LOW (ref 3.87–5.11)
RDW: 13.6 % (ref 11.5–15.5)
WBC: 11.8 10*3/uL — ABNORMAL HIGH (ref 4.0–10.5)
nRBC: 0 % (ref 0.0–0.2)

## 2022-08-28 LAB — MAGNESIUM: Magnesium: 2 mg/dL (ref 1.7–2.4)

## 2022-08-28 MED ORDER — SODIUM CHLORIDE 1 G PO TABS
1.0000 g | ORAL_TABLET | Freq: Three times a day (TID) | ORAL | Status: DC
  Administered 2022-08-28 – 2022-08-29 (×4): 1 g via ORAL
  Filled 2022-08-28 (×4): qty 1

## 2022-08-28 NOTE — [place] FL2 (Signed)
[place] MEDICAID FL2 LEVEL OF CARE FORM     IDENTIFICATION  Patient Name: Ellen Chandler Birthdate: May 09, 1930 Sex: female Admission Date (Current Location): 08/23/2022  [facility] and [place] Number:  Producer, television/film/video and Address:  [facility], [address] [place], [place] [address]      Provider Number: [id number]  Attending Physician Name and Address:  Glade Lloyd, MD  Relative Name and Phone Number:  Calliope, Delangel (D-I-L) Daughter   [phone]    Current Level of Care: Hospital Recommended Level of Care: Skilled Nursing Facility Prior Approval Number:    Date Approved/Denied:   PASRR Number: [id number]  Discharge Plan: SNF    Current Diagnoses: Patient Active Problem List   Diagnosis Date Noted   Cholecystostomy tube dysfunction 08/24/2022   Hiatal hernia 07/30/2022   Malnutrition of moderate degree 07/27/2022   Hypoxia 07/27/2022   Acute cholecystitis 07/24/2022   Acute on chronic diastolic CHF (congestive heart failure) 07/09/2022   Acute hypoxemic respiratory failure 07/09/2022   Paroxysmal A-fib 07/09/2022   AKI (acute kidney injury) 02/06/2022   Atrial flutter 02/04/2022   Acute on chronic combined systolic and diastolic CHF (congestive heart failure) 02/03/2022   GERD (gastroesophageal reflux disease) 02/03/2022   Acquired hypothyroidism 02/03/2022   Acute on chronic combined systolic and diastolic HF (heart failure) 01/17/2022   Non-ST elevation (NSTEMI) myocardial infarction 01/05/2022   History of ETOH abuse 01/04/2022   Chronic diastolic CHF (congestive heart failure) 01/04/2022   UTI (urinary tract infection) 06/17/2016   Nonspecific abnormal electrocardiogram (ECG) (EKG) 05/06/2013   Acute bronchitis 05/06/2013   Essential hypertension 05/06/2013   Acute diastolic CHF (congestive heart failure) (HCC) 05/06/2013   Dehydration 05/05/2013   CAP (community acquired pneumonia) 05/05/2013   Orthostatic  hypotension 05/05/2013   Hyponatremia 05/05/2013    Orientation RESPIRATION BLADDER Height & Weight     Self, Time, Situation, Place  Normal Incontinent Weight: 138 lb 14.2 oz (63 kg) Height:   (165.1 cm)  BEHAVIORAL SYMPTOMS/MOOD NEUROLOGICAL BOWEL NUTRITION STATUS      Incontinent Diet (see discharge summary)  AMBULATORY STATUS COMMUNICATION OF NEEDS Skin   Limited Assist Verbally Surgical wounds                       Personal Care Assistance Level of Assistance  Bathing, Feeding, Dressing Bathing Assistance: Limited assistance Feeding assistance: Independent Dressing Assistance: Limited assistance     Functional Limitations Info  Sight, Hearing, Speech Sight Info: Adequate Hearing Info: Adequate Speech Info: Adequate    SPECIAL CARE FACTORS FREQUENCY  PT (By licensed PT), OT (By licensed OT)     PT Frequency: 5x week OT Frequency: 5x week            Contractures Contractures Info: Not present    Additional Factors Info  Code Status, Allergies Code Status Info: DNR Allergies Info: NKA           Current Medications (08/28/2022):  This is the current hospital active medication list Current Facility-Administered Medications  Medication Dose Route Frequency Provider Last Rate Last Admin   acetaminophen (TYLENOL) tablet 650 mg  650 mg Oral Q6H PRN Howerter, Justin B, DO   650 mg at 08/28/22 0609   Or   acetaminophen (TYLENOL) suppository 650 mg  650 mg Rectal Q6H PRN Howerter, Justin B, DO       amiodarone (PACERONE) tablet 200 mg  200 mg Oral q AM Howerter, Justin B, DO  200 mg at 08/28/22 0602   apixaban (ELIQUIS) tablet 5 mg  5 mg Oral BID Howerter, Justin B, DO   5 mg at 08/28/22 1015   fentaNYL (SUBLIMAZE) injection 25 mcg  25 mcg Intravenous Q2H PRN Howerter, Justin B, DO   25 mcg at 08/24/22 1429   levothyroxine (SYNTHROID) tablet 88 mcg  88 mcg Oral QAC breakfast Howerter, Justin B, DO   88 mcg at 08/28/22 0829   melatonin tablet 3 mg  3 mg  Oral QHS PRN Howerter, Justin B, DO   3 mg at 08/26/22 2153   metoprolol succinate (TOPROL-XL) 24 hr tablet 12.5 mg  12.5 mg Oral Daily Howerter, Justin B, DO   12.5 mg at 08/28/22 1015   naloxone (NARCAN) injection 0.4 mg  0.4 mg Intravenous PRN Howerter, Justin B, DO       ondansetron (ZOFRAN) injection 4 mg  4 mg Intravenous Q6H PRN Howerter, Justin B, DO       Oral care mouth rinse  15 mL Mouth Rinse PRN Howerter, Justin B, DO       sodium chloride flush (NS) 0.9 % injection 5 mL  5 mL Intracatheter Q8H Oley Balm, MD   5 mL at 08/28/22 0602   sodium chloride tablet 1 g  1 g Oral TID WC Glade Lloyd, MD         Discharge Medications: Please see discharge summary for a list of discharge medications.  Relevant Imaging Results:  Relevant Lab Results:   Additional Information SSN [id number].  Pt admitted from [facility] for short term rehab, but will require long term care.  Medicaid application has been submitted but not yet approved.  [employer] is willing to approve more rehab with the understanding that pt will transition to LTC.  Looking for a SNF that can provide both.  Lorri Frederick, LCSW

## 2022-08-28 NOTE — Progress Notes (Signed)
Physical Therapy Treatment Patient Details Name: Ellen Chandler MRN: [id number] DOB: 11-02-1930 Today's Date: 08/28/2022   History of Present Illness 89 y.o. female presents from [facility] to [facility] hospital on 08/23/2022 for leakage around cholecystostomy tube and RUQ pain. Pt found to have infection of cholecystostomy drain.  S/p 4/19 exchange/reposition of cholecystostomy catheter in radiology  PMH includes CHF, HTN, hypothyroidism, PAF.    PT Comments    Pt received in supine and agreeable to session with encouragement. Pt able to demonstrate improved bed mobility requiring increased time, but no physical assist. Pt able to sit EOB with no UE support for ~3 mins with no LOB. Pt unable to stand from low EOB, but was able to stand from elevated EOB with min A. Pt attempting to step forward, however reported increased incision pain and returned to sitting EOB. Pt attempting to stand again, but was unable due to fatigue. Pt requesting to return to supine and was left with the bed in chair position to promote upright posture. Pt continues to benefit from PT services to progress toward functional mobility goals.    Recommendations for follow up therapy are one component of a multi-disciplinary discharge planning process, led by the attending physician.  Recommendations may be updated based on patient status, additional functional criteria and insurance authorization.  Follow Up Recommendations  Can patient physically be transported by private vehicle: No    Assistance Recommended at Discharge Frequent or constant Supervision/Assistance  Patient can return home with the following A lot of help with walking and/or transfers;A lot of help with bathing/dressing/bathroom;Assistance with cooking/housework;Direct supervision/assist for medications management;Direct supervision/assist for financial management;Assist for transportation;Help with stairs or ramp for entrance   Equipment Recommendations   None recommended by PT    Recommendations for Other Services       Precautions / Restrictions Precautions Precautions: Fall Restrictions Weight Bearing Restrictions: No     Mobility  Bed Mobility Overal bed mobility: Needs Assistance Bed Mobility: Supine to Sit, Sit to Supine     Supine to sit: Min guard, HOB elevated Sit to supine: Min guard, HOB elevated   General bed mobility comments: Pt able to perform all bed mobility with min guard and increased time. Cues for sequencing.    Transfers Overall transfer level: Needs assistance Equipment used: Rolling walker (2 wheels) Transfers: Sit to/from Stand Sit to Stand: From elevated surface, Min assist           General transfer comment: From elevated EOB with min A for power up. Pt unable to remain standing for more than a minute or perform a second stand due to fatigue and pain.    Ambulation/Gait               General Gait Details: unable      Balance Overall balance assessment: Needs assistance Sitting-balance support: Feet supported, No upper extremity supported Sitting balance-Leahy Scale: Fair Sitting balance - Comments: sitting EOB   Standing balance support: Bilateral upper extremity supported, During functional activity, Reliant on assistive device for balance Standing balance-Leahy Scale: Poor Standing balance comment: with RW support                            Cognition Arousal/Alertness: Awake/alert Behavior During Therapy: WFL for tasks assessed/performed Overall Cognitive Status: Within Functional Limits for tasks assessed  Exercises      General Comments General comments (skin integrity, edema, etc.): VSS on RA      Pertinent Vitals/Pain Pain Assessment Pain Assessment: Faces Faces Pain Scale: Hurts little more Pain Location: R flank with movement Pain Descriptors / Indicators: Grimacing, Guarding,  Moaning Pain Intervention(s): Limited activity within patient's tolerance, Monitored during session, Repositioned     PT Goals (current goals can now be found in the care plan section) Acute Rehab PT Goals Patient Stated Goal: get back to [facility] PT Goal Formulation: With patient Time For Goal Achievement: 09/09/22 Potential to Achieve Goals: Fair Progress towards PT goals: Progressing toward goals    Frequency    Min 3X/week      PT Plan Current plan remains appropriate       AM-PAC PT "6 Clicks" Mobility   Outcome Measure  Help needed turning from your back to your side while in a flat bed without using bedrails?: None Help needed moving from lying on your back to sitting on the side of a flat bed without using bedrails?: A Little Help needed moving to and from a bed to a chair (including a wheelchair)?: A Lot Help needed standing up from a chair using your arms (e.g., wheelchair or bedside chair)?: A Little Help needed to walk in hospital room?: A Lot Help needed climbing 3-5 steps with a railing? : Total 6 Click Score: 15    End of Session Equipment Utilized During Treatment: Gait belt Activity Tolerance: Patient tolerated treatment well Patient left: in bed;with call bell/phone within reach Nurse Communication: Mobility status PT Visit Diagnosis: Unsteadiness on feet (R26.81);Muscle weakness (generalized) (M62.81);Difficulty in walking, not elsewhere classified (R26.2)     Time: 1610-9604 PT Time Calculation (min) (ACUTE ONLY): 22 min  Charges:  $Therapeutic Activity: 8-22 mins                     Johny Shock, PTA Acute Rehabilitation Services Secure Chat Preferred  [phone]    Johny Shock 08/28/2022, 1:12 PM

## 2022-08-28 NOTE — TOC Progression Note (Addendum)
Transition of Care [facility]) - Progression Note    Patient Details  Name: Ellen Chandler MRN: [id number] Date of Birth: [birth year]  Transition of Care [facility]) CM/SW Contact  Ellen Frederick, LCSW Phone Number: 08/28/2022, 11:04 AM  Clinical Narrative:   CSW LM with HTA regarding process for SNF appeal.    1050: TC Dr Marcha Solders.  Discussed pt requiring LTC, medicaid pending at this time.  He is willing to potentially approve additional STR at this time if we can locate a SNF that will also provide LTC afterwards. [facility] has informed him they cannot do this, he also reports [facility] does not have Child psychotherapist to facilitate transfer to [facility] SNF from that site, so he would like to see if this new facility can be located from the hospital.  Referral sent out on hub.   1145: Sherian Maroon: SNF approval, 7 days: [id number].  PTAR: [id number].  1300: TC [facility]/DIL.  Discussed the above, shared 3 current bed offers, Ellen Chandler will review these places.  CSW reached out to [facility], [place], [place], [place] asking if they would review.         Expected Discharge Plan and Services         Expected Discharge Date: 08/25/22                                     Social Determinants of Health (SDOH) Interventions SDOH Screenings   Food Insecurity: No Food Insecurity (08/23/2022)  Housing: Low Risk  (08/23/2022)  Transportation Needs: No Transportation Needs (08/23/2022)  Utilities: Not At Risk (08/23/2022)  Alcohol Screen: Low Risk  (07/10/2022)  Financial Resource Strain: Low Risk  (07/10/2022)  Tobacco Use: Low Risk  (08/27/2022)    Readmission Risk Interventions    07/27/2022    2:36 PM  Readmission Risk Prevention Plan  Transportation Screening Complete  PCP or Specialist Appt within 3-5 Days Complete  HRI or Home Care Consult Complete  Social Work Consult for Recovery Care Planning/Counseling Complete  Palliative Care Screening Not Applicable  Medication Review Special educational needs teacher) Complete

## 2022-08-28 NOTE — Progress Notes (Signed)
PROGRESS NOTE    ALANII RAMER  ZOX:[id number] DOB: Sep 01, 1930 DOA: 08/23/2022 PCP: Eloisa Northern, MD   Brief Narrative:  89 year old female past medical history significant for paroxysmal atrial fibrillation, chronic Eliquis use, chronic diastolic heart failure, hypertension, hypothyroidism and acute cholecystitis status post cholecystostomy tube placement was admitted with cholecystostomy tube leak.  IR did exchange/repositioning of cholecystostomy tube on 08/24/2022.  PT recommended SNF placement.  Currently medically stable for discharge to SNF.  Insurance denied SNF placement.  Family has appealed the denial.  Assessment & Plan:   Cholecystostomy tube leak/malfunction History of acute cholecystitis status post cholecystostomy tube placement -admitted with cholecystostomy tube leak.  IR did exchange/repositioning of cholecystostomy tube on 08/24/2022.  Follow-up with IR regarding further care for cholecystostomy tube  Generalized deconditioning -PT recommended SNF placement.  Currently medically stable for discharge to SNF.  TOC following.  -Insurance denied SNF placement.  Family has appealed the denial.  Paroxysmal A-fib -Currently rate controlled.  Continue Eliquis, amiodarone and metoprolol succinate  Hypothyroidism -Continue levothyroxine  Chronic diastolic heart failure -Strict input and output.  Daily weights.  Currently compensated.  Hyponatremia -Sodium was 127 today.  Start salt tablets.  Encourage oral intake.  Repeat a.m. labs  Leukocytosis -Mild.  Monitor intermittently.  Normocytic anemia/anemia of chronic disease -Hemoglobin stable.  Monitor intermittently  Hypertension -Monitor blood pressure.  Continue metoprolol succinate    DVT prophylaxis: Eliquis Code Status: DNR Family Communication: None at bedside Disposition Plan: Status is: Observation The patient will require care spanning > 2 midnights and should be moved to inpatient because: Of severity  of illness.  Currently medically stable for discharge.  Family has appealed SNF denial.   Consultants: IR  Procedures: As above  Antimicrobials: None   Subjective: Patient seen and examined at bedside.  No fever, vomiting, worsening abdominal pain reported.  Still slow to respond and a poor historian. Objective: Vitals:   08/27/22 1344 08/27/22 1938 08/28/22 0502 08/28/22 0811  BP: (!) 156/62 (!) 146/67 (!) 110/57 134/63  Pulse: 71 68 74 70  Resp: Temp: 97.9 F (36.6 C) 97.9 F (36.6 C) 98 F (36.7 C) 98 F (36.7 C)  TempSrc: Oral     SpO2: 96% 93% 93% 93%  Weight:      Height:        Intake/Output Summary (Last 24 hours) at 08/28/2022 0813 Last data filed at 08/27/2022 2057 Gross per 24 hour  Intake 480 ml  Output 262 ml  Net 218 ml    Filed Weights   08/24/22 0320 08/25/22 0500 08/27/22 0500  Weight: 64.4 kg 61.8 kg 63 kg    Examination:  General exam: No acute distress.  On room air currently.  Elderly female lying in bed.  Sleepy, wakes up slightly, still slow to respond.  Flat affect.  Poor historian. Respiratory system: Decreased breath sounds at the bases bilaterally with some crackles  cardiovascular system: Rate mostly controlled; S1 and S2 heard Gastrointestinal system: Abdomen is distended slightly; soft and nontender.  Bowel sounds normally heard.  Right upper quadrant cholecystostomy tube present Extremities: Mild lower extremity edema present; no cyanosis  Data Reviewed: I have personally reviewed following labs and imaging studies  CBC: Recent Labs  Lab 08/23/22 1609 08/24/22 0130 08/28/22 0205  WBC 12.7* 12.3* 11.8*  NEUTROABS 8.0* 9.0* 7.6  HGB 12.4 11.6* 11.9*  HCT 37.4 35.5* 34.7*  MCV 93.7 94.9 91.6  PLT 397 388 365    Basic Metabolic  Panel: Recent Labs  Lab 08/23/22 1609 08/23/22 2246 08/24/22 0130 08/28/22 0205  NA 135 132* 128* 127*  K 2.7* 4.1 3.9 3.8  CL 105 91* 91* 92*  CO2 GLUCOSE 73 94 93  99  BUN CREATININE 0.60 0.84 0.84 0.76  CALCIUM 5.7* 8.4* 8.1* 8.6*  MG  --   --  1.9 2.0    GFR: Estimated Creatinine Clearance: 42.9 mL/min (by C-G formula based on SCr of 0.76 mg/dL). Liver Function Tests: Recent Labs  Lab 08/23/22 1609 08/24/22 0130 08/28/22 0205  AST ALT 27 34 27  ALKPHOS 40 58 46  BILITOT 0.2* 0.6 0.5  PROT 3.7* 5.4* 5.5*  ALBUMIN <1.5* 2.2* 2.3*    No results for input(s): "LIPASE", "AMYLASE" in the last 168 hours. No results for input(s): "AMMONIA" in the last 168 hours. Coagulation Profile: Recent Labs  Lab 08/24/22 0130  INR 1.4*    Cardiac Enzymes: No results for input(s): "CKTOTAL", "CKMB", "CKMBINDEX", "TROPONINI" in the last 168 hours. BNP (last 3 results) No results for input(s): "PROBNP" in the last 8760 hours. HbA1C: No results for input(s): "HGBA1C" in the last 72 hours. CBG: No results for input(s): "GLUCAP" in the last 168 hours. Lipid Profile: No results for input(s): "CHOL", "HDL", "LDLCALC", "TRIG", "CHOLHDL", "LDLDIRECT" in the last 72 hours. Thyroid Function Tests: No results for input(s): "TSH", "T4TOTAL", "FREET4", "T3FREE", "THYROIDAB" in the last 72 hours. Anemia Panel: No results for input(s): "VITAMINB12", "FOLATE", "FERRITIN", "TIBC", "IRON", "RETICCTPCT" in the last 72 hours. Sepsis Labs: No results for input(s): "PROCALCITON", "LATICACIDVEN" in the last 168 hours.  No results found for this or any previous visit (from the past 240 hour(s)).       Radiology Studies: No results found.      Scheduled Meds:  amiodarone  200 mg Oral q AM   apixaban  5 mg Oral BID   levothyroxine  88 mcg Oral QAC breakfast   metoprolol succinate  12.5 mg Oral Daily   sodium chloride flush  5 mL Intracatheter Q8H   Continuous Infusions:        Glade Lloyd, MD [facility] 08/28/2022, 8:13 AM

## 2022-08-28 NOTE — Plan of Care (Signed)
  Problem: Education: Goal: Knowledge of General Education information will improve Description: Including pain rating scale, medication(s)/side effects and non-pharmacologic comfort measures Outcome: Progressing   Problem: Health Behavior/Discharge Planning: Goal: Ability to manage health-related needs will improve Outcome: Progressing   Problem: Clinical Measurements: Goal: Ability to maintain clinical measurements within normal limits will improve Outcome: Progressing Goal: Will remain free from infection Outcome: Progressing   Problem: Activity: Goal: Risk for activity intolerance will decrease Outcome: Progressing   Problem: Pain Managment: Goal: General experience of comfort will improve Outcome: Progressing   Problem: Safety: Goal: Ability to remain free from injury will improve Outcome: Progressing   

## 2022-08-28 NOTE — Progress Notes (Signed)
    Chief Complaint: Patient was seen today for perc chole drain  Supervising Physician: Gilmer Mor  Patient Status: [facility] - In-pt  Subjective: S/p perc chole on 3/21 S/p exchange of tube 4/19 due to site pain. Pt doing okay, NAD. A little sleepy.  Objective: Physical Exam: BP 134/63 (BP Location: Right Arm)   Pulse 70   Temp 98 F (36.7 C)   Resp 17   Ht  (1.651 m)   Wt 138 lb 14.2 oz (63 kg)   SpO2 93%   BMI 23.11 kg/m  NAD Lungs: CTA without w/r/r Heart: Regular Abdomen: RUQ drain intact, site clean, NT. Small volume bilious output     Current Facility-Administered Medications:    acetaminophen (TYLENOL) tablet 650 mg, 650 mg, Oral, Q6H PRN, 650 mg at 08/28/22 1138 **OR** acetaminophen (TYLENOL) suppository 650 mg, 650 mg, Rectal, Q6H PRN, Howerter, Justin B, DO   amiodarone (PACERONE) tablet 200 mg, 200 mg, Oral, q AM, Howerter, Justin B, DO, 200 mg at 08/28/22 0602   apixaban (ELIQUIS) tablet 5 mg, 5 mg, Oral, BID, Howerter, Justin B, DO, 5 mg at 08/28/22 1015   fentaNYL (SUBLIMAZE) injection 25 mcg, 25 mcg, Intravenous, Q2H PRN, Howerter, Justin B, DO, 25 mcg at 08/24/22 1429   levothyroxine (SYNTHROID) tablet 88 mcg, 88 mcg, Oral, QAC breakfast, Howerter, Justin B, DO, 88 mcg at 08/28/22 0829   melatonin tablet 3 mg, 3 mg, Oral, QHS PRN, Howerter, Justin B, DO, 3 mg at 08/26/22 2153   metoprolol succinate (TOPROL-XL) 24 hr tablet 12.5 mg, 12.5 mg, Oral, Daily, Howerter, Justin B, DO, 12.5 mg at 08/28/22 1015   naloxone (NARCAN) injection 0.4 mg, 0.4 mg, Intravenous, PRN, Howerter, Justin B, DO   ondansetron (ZOFRAN) injection 4 mg, 4 mg, Intravenous, Q6H PRN, Howerter, Justin B, DO   Oral care mouth rinse, 15 mL, Mouth Rinse, PRN, Howerter, Justin B, DO   sodium chloride flush (NS) 0.9 % injection 5 mL, 5 mL, Intracatheter, Q8H, Oley Balm, MD, 5 mL at 08/28/22 0602   sodium chloride tablet 1 g, 1 g, Oral, TID WC, Alekh, Kshitiz, MD, 1 g at 08/28/22  1138  Labs: CBC Recent Labs    08/28/22 0205  WBC 11.8*  HGB 11.9*  HCT 34.7*  PLT 365   BMET Recent Labs    08/28/22 0205  NA 127*  K 3.8  CL 92*  CO2 25  GLUCOSE 99  BUN 20  CREATININE 0.76  CALCIUM 8.6*   LFT Recent Labs    08/28/22 0205  PROT 5.5*  ALBUMIN 2.3*  AST 26  ALT 27  ALKPHOS 46  BILITOT 0.5   PT/INR No results for input(s): "LABPROT", "INR" in the last 72 hours.   Studies/Results: No results found.  Assessment/Plan: Cholecystitis S/p perc chole exchange 4/19(original placement 3/21) Drain function good. Dispo pending IR will coordinate routine outpt drain exchanges.    LOS: 0 days   I spent a total of 20 minutes in face to face in clinical consultation, greater than 50% of which was counseling/coordinating care for perc chole drain  Brayton El PA-C 08/28/2022 1:01 PM

## 2022-08-29 DIAGNOSIS — M2550 Pain in unspecified joint: Secondary | ICD-10-CM | POA: Diagnosis not present

## 2022-08-29 DIAGNOSIS — E038 Other specified hypothyroidism: Secondary | ICD-10-CM | POA: Diagnosis not present

## 2022-08-29 DIAGNOSIS — E440 Moderate protein-calorie malnutrition: Secondary | ICD-10-CM | POA: Diagnosis not present

## 2022-08-29 DIAGNOSIS — R1011 Right upper quadrant pain: Secondary | ICD-10-CM

## 2022-08-29 DIAGNOSIS — I5022 Chronic systolic (congestive) heart failure: Secondary | ICD-10-CM | POA: Diagnosis not present

## 2022-08-29 DIAGNOSIS — I480 Paroxysmal atrial fibrillation: Secondary | ICD-10-CM | POA: Diagnosis not present

## 2022-08-29 DIAGNOSIS — R41841 Cognitive communication deficit: Secondary | ICD-10-CM | POA: Diagnosis not present

## 2022-08-29 DIAGNOSIS — Z434 Encounter for attention to other artificial openings of digestive tract: Secondary | ICD-10-CM | POA: Diagnosis not present

## 2022-08-29 DIAGNOSIS — K5900 Constipation, unspecified: Secondary | ICD-10-CM | POA: Diagnosis not present

## 2022-08-29 DIAGNOSIS — I10 Essential (primary) hypertension: Secondary | ICD-10-CM | POA: Diagnosis not present

## 2022-08-29 DIAGNOSIS — R5381 Other malaise: Secondary | ICD-10-CM | POA: Diagnosis not present

## 2022-08-29 DIAGNOSIS — T85518D Breakdown (mechanical) of other gastrointestinal prosthetic devices, implants and grafts, subsequent encounter: Secondary | ICD-10-CM | POA: Diagnosis not present

## 2022-08-29 DIAGNOSIS — K449 Diaphragmatic hernia without obstruction or gangrene: Secondary | ICD-10-CM | POA: Diagnosis not present

## 2022-08-29 DIAGNOSIS — E785 Hyperlipidemia, unspecified: Secondary | ICD-10-CM | POA: Diagnosis not present

## 2022-08-29 DIAGNOSIS — F32A Depression, unspecified: Secondary | ICD-10-CM | POA: Diagnosis not present

## 2022-08-29 DIAGNOSIS — K810 Acute cholecystitis: Secondary | ICD-10-CM | POA: Diagnosis not present

## 2022-08-29 DIAGNOSIS — G4700 Insomnia, unspecified: Secondary | ICD-10-CM | POA: Diagnosis not present

## 2022-08-29 DIAGNOSIS — R296 Repeated falls: Secondary | ICD-10-CM | POA: Diagnosis not present

## 2022-08-29 DIAGNOSIS — I5032 Chronic diastolic (congestive) heart failure: Secondary | ICD-10-CM | POA: Diagnosis not present

## 2022-08-29 DIAGNOSIS — T8579XA Infection and inflammatory reaction due to other internal prosthetic devices, implants and grafts, initial encounter: Secondary | ICD-10-CM | POA: Diagnosis not present

## 2022-08-29 DIAGNOSIS — Z7401 Bed confinement status: Secondary | ICD-10-CM | POA: Diagnosis not present

## 2022-08-29 DIAGNOSIS — M6281 Muscle weakness (generalized): Secondary | ICD-10-CM | POA: Diagnosis not present

## 2022-08-29 DIAGNOSIS — E871 Hypo-osmolality and hyponatremia: Secondary | ICD-10-CM | POA: Diagnosis not present

## 2022-08-29 DIAGNOSIS — R2681 Unsteadiness on feet: Secondary | ICD-10-CM | POA: Diagnosis not present

## 2022-08-29 DIAGNOSIS — K219 Gastro-esophageal reflux disease without esophagitis: Secondary | ICD-10-CM | POA: Diagnosis not present

## 2022-08-29 DIAGNOSIS — E039 Hypothyroidism, unspecified: Secondary | ICD-10-CM | POA: Diagnosis not present

## 2022-08-29 DIAGNOSIS — F1011 Alcohol abuse, in remission: Secondary | ICD-10-CM | POA: Diagnosis not present

## 2022-08-29 DIAGNOSIS — R1311 Dysphagia, oral phase: Secondary | ICD-10-CM | POA: Diagnosis not present

## 2022-08-29 DIAGNOSIS — R1310 Dysphagia, unspecified: Secondary | ICD-10-CM | POA: Diagnosis not present

## 2022-08-29 DIAGNOSIS — T85598A Other mechanical complication of other gastrointestinal prosthetic devices, implants and grafts, initial encounter: Secondary | ICD-10-CM | POA: Diagnosis not present

## 2022-08-29 DIAGNOSIS — T85518A Breakdown (mechanical) of other gastrointestinal prosthetic devices, implants and grafts, initial encounter: Secondary | ICD-10-CM | POA: Diagnosis not present

## 2022-08-29 LAB — CBC WITH DIFFERENTIAL/PLATELET
Abs Immature Granulocytes: 0.22 10*3/uL — ABNORMAL HIGH (ref 0.00–0.07)
Basophils Absolute: 0.1 10*3/uL (ref 0.0–0.1)
Basophils Relative: 1 %
Eosinophils Absolute: 0.2 10*3/uL (ref 0.0–0.5)
Eosinophils Relative: 2 %
HCT: 34.2 % — ABNORMAL LOW (ref 36.0–46.0)
Hemoglobin: 11.3 g/dL — ABNORMAL LOW (ref 12.0–15.0)
Immature Granulocytes: 2 %
Lymphocytes Relative: 25 %
Lymphs Abs: 2.5 10*3/uL (ref 0.7–4.0)
MCH: 30.9 pg (ref 26.0–34.0)
MCHC: 33 g/dL (ref 30.0–36.0)
MCV: 93.4 fL (ref 80.0–100.0)
Monocytes Absolute: 1.2 10*3/uL — ABNORMAL HIGH (ref 0.1–1.0)
Monocytes Relative: 12 %
Neutro Abs: 6.1 10*3/uL (ref 1.7–7.7)
Neutrophils Relative %: 58 %
Platelets: 342 10*3/uL (ref 150–400)
RBC: 3.66 MIL/uL — ABNORMAL LOW (ref 3.87–5.11)
RDW: 13.8 % (ref 11.5–15.5)
WBC: 10.3 10*3/uL (ref 4.0–10.5)
nRBC: 0 % (ref 0.0–0.2)

## 2022-08-29 LAB — BASIC METABOLIC PANEL
Anion gap: 9 (ref 5–15)
BUN: 18 mg/dL (ref 8–23)
CO2: 27 mmol/L (ref 22–32)
Calcium: 8.6 mg/dL — ABNORMAL LOW (ref 8.9–10.3)
Chloride: 95 mmol/L — ABNORMAL LOW (ref 98–111)
Creatinine, Ser: 0.91 mg/dL (ref 0.44–1.00)
GFR, Estimated: >60 mL/min (ref >60)
Glucose, Bld: 96 mg/dL (ref 70–99)
Potassium: 3.9 mmol/L (ref 3.5–5.1)
Sodium: 131 mmol/L — ABNORMAL LOW (ref 135–145)

## 2022-08-29 LAB — MAGNESIUM: Magnesium: 2 mg/dL (ref 1.7–2.4)

## 2022-08-29 MED ORDER — OXYCODONE HCL 5 MG PO TABS: 5.0000 mg | ORAL_TABLET | ORAL | 0 refills | Status: DC | PRN

## 2022-08-29 MED ORDER — SODIUM CHLORIDE 0.9% FLUSH: 5.0000 mL | Freq: Every day | INTRAVENOUS | 0 refills | Status: AC

## 2022-08-29 NOTE — Progress Notes (Addendum)
Mobility Specialist Progress Note   08/29/22 1355  Mobility  Activity Moved into chair position in bed (Bed level exercise)  Level of Assistance Standby assist, set-up cues, supervision of patient - no hands on  Assistive Device None  Range of Motion/Exercises Active;All extremities  Activity Response Tolerated well   Patient received in supine and agreeable to participate with encouragement. Deferred OOB activity for unspecified reasons. Opted to bed level exercise and AROM. Completed BLE exercise and UE AROM with supervision. Tolerated without complaint or incident. Was left in supine with all needs met, call bell in reach.   Ellen Chandler, BS EXP Mobility Specialist Please contact via SecureChat or Rehab office at [phone]

## 2022-08-29 NOTE — Discharge Summary (Signed)
Physician Discharge Summary   Patient: Ellen Chandler MRN: [id number] DOB: 28-Dec-1930  Admit date:     08/23/2022  Discharge date: 08/29/22  Discharge Physician: Thad Ranger, MD    PCP: Eloisa Northern, MD   Recommendations at discharge:    Follow BMET in 1 week IR to coordinate routine outpatient drain exchanges  Discharge Diagnoses:    Cholecystostomy tube dysfunction   Essential hypertension   Chronic diastolic CHF (congestive heart failure)   Acquired hypothyroidism   Paroxysmal A-fib Chronic hyponatremia Generalized debility   Hospital Course: 89 year old female past medical history significant for paroxysmal atrial fibrillation, chronic Eliquis use, chronic diastolic heart failure, hypertension, hypothyroidism and acute cholecystitis status post cholecystostomy tube placement was admitted with cholecystostomy tube leak. IR did exchange/repositioning of cholecystostomy tube on 08/24/2022. PT recommended SNF placement     Assessment and Plan:  Cholecystostomy tube leak/malfunction History of acute cholecystitis status post cholecystostomy tube placement -Patient was admitted with cholecystostomy tube leak.  -IR was consulted and patient underwent exchange/repositioning of cholecystostomy tube on 08/24/2022. -Currently no acute issues, follow-up with IR regarding further care for cholecystostomy tube   Generalized debility -PT recommended SNF placement.   Paroxysmal A-fib -Currently rate controlled.  Continue amiodarone, metoprolol succinate - Continue Eliquis.   Hypothyroidism -Continue levothyroxine   Chronic diastolic heart failure -Compensated and euvolemic  -Continue Toprol-XL, Aldactone   Chronic hyponatremia -Sodium 127 on 4/23, resumed outpatient salt tabs, improving to 131 - patient has chronic hyponatremia, baseline 127-1 32 -Tolerating diet, no acute issues  Leukocytosis -Mild.  Resolved   Normocytic anemia/anemia of chronic disease H&H stable,  appears close to baseline   Hypertension -BP stable, current Patient antihypertensives      \       Pain control - [facility] Controlled Substance Reporting System database was reviewed. and patient was instructed, not to drive, operate heavy machinery, perform activities at heights, swimming or participation in water activities or provide baby-sitting services while on Pain, Sleep and Anxiety Medications; until their outpatient Physician has advised to do so again. Also recommended to not to take more than prescribed Pain, Sleep and Anxiety Medications.  Consultants: Intervention radiology Procedures performed: Exchange/reposition of cholecystostomy catheter upsize to 78F Disposition: Skilled nursing facility Diet recommendation:  Discharge Diet Orders (From admission, onward)     Start     Ordered   08/29/22 0000  Diet general        08/29/22 1225   08/25/22 0000  Diet - low sodium heart healthy        08/25/22 1612           Cardiac diet DISCHARGE MEDICATION: Allergies as of 08/29/2022   No Known Allergies      Medication List     TAKE these medications    acetaminophen 500 MG tablet Commonly known as: TYLENOL Take 500-1,000 mg by mouth every 6 (six) hours as needed for moderate pain.   amiodarone 200 MG tablet Commonly known as: PACERONE Take 1 tablet (200 mg total) by mouth daily. What changed: when to take this   apixaban 5 MG Tabs tablet Commonly known as: ELIQUIS Take 1 tablet (5 mg total) by mouth 2 (two) times daily.   lactose free nutrition Liqd Take 237 mLs by mouth at bedtime.   levothyroxine 88 MCG tablet Commonly known as: SYNTHROID Take 88 mcg by mouth daily before breakfast.   metoprolol succinate 25 MG 24 hr tablet Commonly known as: Toprol XL Take 0.5 tablets (12.5 mg  total) by mouth daily.   One-A-Day Womens 50+ Advantage Tabs Take 1 tablet by mouth daily with breakfast.   oxyCODONE 5 MG immediate release tablet Commonly  known as: Oxy IR/ROXICODONE Take 1 tablet (5 mg total) by mouth every 4 (four) hours as needed for moderate pain.   pantoprazole 40 MG tablet Commonly known as: PROTONIX Take 40 mg by mouth 2 (two) times daily.   polyethylene glycol powder 17 GM/SCOOP powder Commonly known as: GLYCOLAX/MIRALAX Take 17 g by mouth daily as needed for mild constipation.   sodium chloride 1 g tablet Take 1 tablet (1 g total) by mouth 2 (two) times daily with a meal.   spironolactone 25 MG tablet Commonly known as: ALDACTONE Take 0.5 tablets (12.5 mg total) by mouth daily.               Discharge Care Instructions  (From admission, onward)           Start     Ordered   08/29/22 0000  Leave dressing on - Keep it clean, dry, and intact until clinic visit        08/29/22 1225   08/25/22 0000  Discharge wound care:       Comments: Continue current wound care   08/25/22 1612            Follow-up Information     Eloisa Northern, MD. Schedule an appointment as soon as possible for a visit in 2 week(s).   Specialty: Internal Medicine Why: for hospital follow-up Contact information: [address] [place] [address] [phone]                Discharge Exam: Ceasar Mons Weights   08/27/22 0500 08/28/22 1700 08/29/22 0318  Weight: 63 kg 69.1 kg 70 kg   S: No acute complaints, ate most of her breakfast today, feeling better.  Working with PT this morning.  BP (!) 129/55 (BP Location: Right Arm)   Pulse 63   Temp 98.6 F (37 C) (Oral)   Resp 16   Ht 5\' 5"  (1.651 m)   Wt 70 kg   SpO2 95%   BMI 25.68 kg/m   Physical Exam General: Alert and oriented x 3, NAD Cardiovascular: S1 S2 clear, RRR.  Respiratory: CTAB, no wheezing, rales or rhonchi Gastrointestinal: Soft, nontender, nondistended, NBS.  RUQ cholecystostomy tube + Ext: no pedal edema bilaterally Neuro: no new deficits Psych: Normal affect    Condition at discharge: fair  The results of significant diagnostics  from this hospitalization (including imaging, microbiology, ancillary and laboratory) are listed below for reference.   Imaging Studies: IR EXCHANGE BILIARY DRAIN  Result Date: 08/25/2022 INDICATION: Pain at cholecystectomy cholecystostomy catheter site with drainage EXAM: EXCHANGE OF CHOLECYSTOSTOMY CATHETER UNDER FLUOROSCOPY MEDICATIONS: no periprocedural antibiotics were indicated ANESTHESIA/SEDATION: Lidocaine 1% subcutaneous FLUOROSCOPY: Radiation Exposure Index (as provided by the fluoroscopic device): 4 MGy Kerma COMPLICATIONS: None immediate. PROCEDURE: Informed written consent was obtained from the patient after a thorough discussion of the procedural risks, benefits and alternatives. All questions were addressed. Maximal Sterile Barrier Technique was utilized including caps, mask, sterile gowns, sterile gloves, sterile drape, hand hygiene and skin antiseptic. A timeout was performed prior to the initiation of the procedure. Cholecystostomy catheter and surrounding skin prepped with chlorhexidine, draped in usual sterile fashion. Contrast injection showed the catheter to be at the inferior margin of the gallbladder. No extravasation. Cystic duct not visualized. The catheter was cut and exchanged over an Amplatz wire  for a new 12 French pigtail drain catheter, formed more centrally in the lumen of the gallbladder. Upon catheter repositioning, fairly copious amounts of purulent appearing material returned through the catheter. The catheter was secured externally with 0 Prolene suture and StatLock and placed to gravity drain bag. The patient tolerated the procedure well. IMPRESSION: 1. Successful revision of 85 French cholecystostomy catheter, with fluoroscopic guidance. Electronically Signed   By: Corlis Leak M.D.   On: 08/25/2022 08:37   DG Abd Portable 1 View  Result Date: 08/23/2022 CLINICAL DATA:  Drain placement EXAM: PORTABLE ABDOMEN - 1 VIEW COMPARISON:  X-ray 09/20/2017. Drain placement CT  procedure 07/26/2022 FINDINGS: Gas is seen in nondilated loops of small and large bowel. Scattered colonic stool. The diaphragm in the left hemi abdominal edge are clipped off the edge of the film. The film is rotated to the left. Pigtail catheter overlies the right upper quadrant of the abdomen. This very well could be in the gallbladder. However evaluation is limited by x-ray and if needed a CT or ultrasound could be considered IMPRESSION: Nonspecific bowel gas pattern. Pigtail catheter in the right upper quadrant could be within the gallbladder but indeterminate on this 1 portable view x-ray. If needed confirmatory ultrasound or CT could be considered Electronically Signed   By: Karen Kays M.D.   On: 08/23/2022 16:54   DG Chest 2 View  Result Date: 07/30/2022 CLINICAL DATA:  Abnormal lung sounds EXAM: CHEST - 2 VIEW COMPARISON:  X-ray 07/26/2022 and older.  CT 01/04/2022 FINDINGS: Underinflation. Once again there is an enlarged hiatal hernia. Stable cardiopericardial silhouette with bronchovascular crowding. No pneumothorax, effusion or edema. Calcified aorta. Degenerative changes of the spine with osteopenia. Pigtail catheter along the right upper quadrant of the abdomen. There is stable mild compression of the lower thoracic spine vertebral level. IMPRESSION: Underinflation with bronchovascular crowding. Large hiatal hernia. Electronically Signed   By: Karen Kays M.D.   On: 07/30/2022 14:47    Microbiology: Results for orders placed or performed during the hospital encounter of 07/24/22  Surgical pcr screen     Status: Abnormal   Collection Time: 07/25/22  8:52 PM   Specimen: Nasal Mucosa; Nasal Swab  Result Value Ref Range Status   MRSA, PCR NEGATIVE NEGATIVE Final   Staphylococcus aureus POSITIVE (A) NEGATIVE Final    Comment: (NOTE) The Xpert SA Assay (FDA approved for NASAL specimens in patients 54 years of age and older), is one component of a comprehensive surveillance program. It is  not intended to diagnose infection nor to guide or monitor treatment. Performed at [facility], [address], [place], [place] [address]   Aerobic/Anaerobic Culture w Gram Stain (surgical/deep wound)     Status: None   Collection Time: 07/26/22 12:29 PM   Specimen: BILE  Result Value Ref Range Status   Specimen Description   Final    BILE Performed at [facility], [address] [place], [place] [address]    Special Requests   Final    NONE Performed at [facility], [address] [place], [place] [address]    Gram Stain   Final    NO WBC SEEN FEW GRAM POSITIVE RODS RARE GRAM POSITIVE COCCI IN CHAINS RARE GRAM NEGATIVE RODS    Culture   Final    ABUNDANT CITROBACTER FREUNDII ABUNDANT ESCHERICHIA COLI ABUNDANT ENTEROCOCCUS FAECIUM ABUNDANT BACTEROIDES VULGATUS BETA LACTAMASE POSITIVE Performed at [facility], [address], [place],  [place] [address]    Report Status 07/29/2022 FINAL  Final   Organism ID, Bacteria CITROBACTER FREUNDII  Final   Organism ID, Bacteria ESCHERICHIA COLI  Final   Organism ID, Bacteria ENTEROCOCCUS FAECIUM  Final      Susceptibility   Citrobacter freundii - MIC*    CEFEPIME 2 SENSITIVE Sensitive     CEFTAZIDIME 4 SENSITIVE Sensitive     CEFTRIAXONE >=64 RESISTANT Resistant     CIPROFLOXACIN <=0.25 SENSITIVE Sensitive     GENTAMICIN >=16 RESISTANT Resistant     IMIPENEM <=0.25 SENSITIVE Sensitive     TRIMETH/SULFA >=320 RESISTANT Resistant     PIP/TAZO <=4 SENSITIVE Sensitive     * ABUNDANT CITROBACTER FREUNDII   Escherichia coli - MIC*    AMPICILLIN >=32 RESISTANT Resistant     CEFEPIME <=0.12 SENSITIVE Sensitive     CEFTAZIDIME <=1 SENSITIVE Sensitive     CEFTRIAXONE <=0.25 SENSITIVE Sensitive     CIPROFLOXACIN >=4 RESISTANT Resistant     GENTAMICIN <=1 SENSITIVE Sensitive     IMIPENEM <=0.25 SENSITIVE Sensitive     TRIMETH/SULFA >=320 RESISTANT Resistant      AMPICILLIN/SULBACTAM 16 INTERMEDIATE Intermediate     PIP/TAZO <=4 SENSITIVE Sensitive     * ABUNDANT ESCHERICHIA COLI   Enterococcus faecium - MIC*    AMPICILLIN <=2 SENSITIVE Sensitive     VANCOMYCIN <=0.5 SENSITIVE Sensitive     GENTAMICIN SYNERGY SENSITIVE Sensitive     * ABUNDANT ENTEROCOCCUS FAECIUM    Labs: CBC: Recent Labs  Lab 08/23/22 1609 08/24/22 0130 08/28/22 0205 08/29/22 0237  WBC 12.7* 12.3* 11.8* 10.3  NEUTROABS 8.0* 9.0* 7.6 6.1  HGB 12.4 11.6* 11.9* 11.3*  HCT 37.4 35.5* 34.7* 34.2*  MCV 93.7 94.9 91.6 93.4  PLT 397 388 365 342   Basic Metabolic Panel: Recent Labs  Lab 08/23/22 1609 08/23/22 2246 08/24/22 0130 08/28/22 0205 08/29/22 0237  NA 135 132* 128* 127* 131*  K 2.7* 4.1 3.9 3.8 3.9  CL 105 91* 91* 92* 95*  CO2 GLUCOSE 73 94 93 99 96  BUN CREATININE 0.60 0.84 0.84 0.76 0.91  CALCIUM 5.7* 8.4* 8.1* 8.6* 8.6*  MG  --   --  1.9 2.0 2.0   Liver Function Tests: Recent Labs  Lab 08/23/22 1609 08/24/22 0130 08/28/22 0205  AST ALT 27 34 27  ALKPHOS 40 58 46  BILITOT 0.2* 0.6 0.5  PROT 3.7* 5.4* 5.5*  ALBUMIN <1.5* 2.2* 2.3*   CBG: No results for input(s): "GLUCAP" in the last 168 hours.  Discharge time spent: greater than 30 minutes.  Signed: Thad Ranger, MD [facility] 08/29/2022

## 2022-08-29 NOTE — TOC Transition Note (Signed)
Transition of Care [facility]) - CM/SW Discharge Note   Patient Details  Name: Ellen Chandler MRN: [id number] Date of Birth: 06/07/1930  Transition of Care [facility]) CM/SW Contact:  Lorri Frederick, LCSW Phone Number: 08/29/2022, 2:25 PM   Clinical Narrative:   Pt discharging to [facility]  RN call report to [phone].      Final next level of care: Skilled Nursing Facility Barriers to Discharge: Barriers Resolved   Patient Goals and CMS Choice      Discharge Placement                Patient chooses bed at:  [facility]) Patient to be transferred to facility by: PTAR Name of family member notified: [name], daughter in law Patient and family notified of of transfer: 08/29/22  Discharge Plan and Services Additional resources added to the After Visit Summary for                                       Social Determinants of Health (SDOH) Interventions SDOH Screenings   Food Insecurity: No Food Insecurity (08/23/2022)  Housing: Low Risk  (08/23/2022)  Transportation Needs: No Transportation Needs (08/23/2022)  Utilities: Not At Risk (08/23/2022)  Alcohol Screen: Low Risk  (07/10/2022)  Financial Resource Strain: Low Risk  (07/10/2022)  Tobacco Use: Low Risk  (08/27/2022)     Readmission Risk Interventions    07/27/2022    2:36 PM  Readmission Risk Prevention Plan  Transportation Screening Complete  PCP or Specialist Appt within 3-5 Days Complete  HRI or Home Care Consult Complete  Social Work Consult for Recovery Care Planning/Counseling Complete  Palliative Care Screening Not Applicable  Medication Review Oceanographer) Complete

## 2022-08-29 NOTE — Progress Notes (Signed)
Physical Therapy Treatment Patient Details Name: Ellen Chandler MRN: [id number] DOB: [birth year] Today's Date: 08/29/2022   History of Present Illness 89 y.o. female presents from [facility] SNF to [facility] hospital on 08/23/2022 for leakage around cholecystostomy tube and RUQ pain. Pt found to have infection of cholecystostomy drain.  S/p 4/19 exchange/reposition of cholecystostomy catheter in radiology  PMH includes CHF, HTN, hypothyroidism, PAF.    PT Comments    Pt was received in supine and agreeable to session with encouragement. Pt was able to progress mobility this session by requiring less assistance and transferring to [facility]. Pt able to stand from EOB and BSC with improved power up. Pt able to step pivot to [facility] and stand for pericare with min guard. Pt demonstrating improved activity tolerance this session, however continues to be limited by fatigue and weakness. Pt continues to benefit from PT services to progress toward functional mobility goals.     Recommendations for follow up therapy are one component of a multi-disciplinary discharge planning process, led by the attending physician.  Recommendations may be updated based on patient status, additional functional criteria and insurance authorization.  Follow Up Recommendations  Can patient physically be transported by private vehicle: No    Assistance Recommended at Discharge Frequent or constant Supervision/Assistance  Patient can return home with the following A lot of help with walking and/or transfers;A lot of help with bathing/dressing/bathroom;Assistance with cooking/housework;Direct supervision/assist for medications management;Direct supervision/assist for financial management;Assist for transportation;Help with stairs or ramp for entrance   Equipment Recommendations  None recommended by PT    Recommendations for Other Services       Precautions / Restrictions Precautions Precautions: Fall Restrictions Weight Bearing  Restrictions: No     Mobility  Bed Mobility Overal bed mobility: Needs Assistance Bed Mobility: Supine to Sit, Sit to Supine     Supine to sit: Supervision, HOB elevated Sit to supine: Supervision, HOB elevated   General bed mobility comments: increased time and cues for technique    Transfers Overall transfer level: Needs assistance Equipment used: Rolling walker (2 wheels) Transfers: Sit to/from Stand, Bed to chair/wheelchair/BSC Sit to Stand: Min guard, From elevated surface   Step pivot transfers: Min guard       General transfer comment: From elevated EOB x1 and BSC x2 with min guard for safety, increased time for power up, and cues for hand placement. Pt able to take small steps from EOB <> BSC with min guard for safety and cues for sequencing.    Ambulation/Gait               General Gait Details: Pt deferred due to fatigue and weakness      Balance Overall balance assessment: Needs assistance Sitting-balance support: Feet supported, No upper extremity supported Sitting balance-Leahy Scale: Fair Sitting balance - Comments: sitting EOB   Standing balance support: Bilateral upper extremity supported, During functional activity, Reliant on assistive device for balance Standing balance-Leahy Scale: Poor Standing balance comment: with RW support                            Cognition Arousal/Alertness: Awake/alert Behavior During Therapy: WFL for tasks assessed/performed Overall Cognitive Status: Within Functional Limits for tasks assessed  Exercises      General Comments General comments (skin integrity, edema, etc.): VSS      Pertinent Vitals/Pain Pain Assessment Pain Assessment: 0-10 Pain Score: 4  Pain Location: R flank with movement Pain Descriptors / Indicators: Grimacing, Guarding, Moaning Pain Intervention(s): Monitored during session, Repositioned     PT Goals  (current goals can now be found in the care plan section) Acute Rehab PT Goals Patient Stated Goal: get back to [facility] PT Goal Formulation: With patient Time For Goal Achievement: 09/09/22 Potential to Achieve Goals: Fair Progress towards PT goals: Progressing toward goals    Frequency    Min 3X/week      PT Plan Current plan remains appropriate       AM-PAC PT "6 Clicks" Mobility   Outcome Measure  Help needed turning from your back to your side while in a flat bed without using bedrails?: None Help needed moving from lying on your back to sitting on the side of a flat bed without using bedrails?: A Little Help needed moving to and from a bed to a chair (including a wheelchair)?: A Little Help needed standing up from a chair using your arms (e.g., wheelchair or bedside chair)?: A Little Help needed to walk in hospital room?: A Little Help needed climbing 3-5 steps with a railing? : Total 6 Click Score: 17    End of Session Equipment Utilized During Treatment: Gait belt Activity Tolerance: Patient limited by fatigue Patient left: in bed;with call bell/phone within reach;with bed alarm set Nurse Communication: Mobility status PT Visit Diagnosis: Unsteadiness on feet (R26.81);Muscle weakness (generalized) (M62.81);Difficulty in walking, not elsewhere classified (R26.2)     Time: 1610-9604 PT Time Calculation (min) (ACUTE ONLY): 29 min  Charges:  $Therapeutic Activity: 23-37 mins                     Johny Shock, PTA Acute Rehabilitation Services Secure Chat Preferred  [phone]    Johny Shock 08/29/2022, 9:17 AM

## 2022-08-29 NOTE — TOC Progression Note (Addendum)
Transition of Care [facility]) - Progression Note    Patient Details  Name: Ellen Chandler MRN: [id number] Date of Birth: February 10, 1931  Transition of Care [facility]) CM/SW Contact  Lorri Frederick, LCSW Phone Number: 08/29/2022, 8:50 AM  Clinical Narrative:   CSW received message from Martha/DIL, they want to accept offer at [facility].  CSW confirmed with [facility] that they can accept today.  MD informed.   28: SNF choice given to HTA, left message.        Expected Discharge Plan and Services         Expected Discharge Date: 08/25/22                                     Social Determinants of Health (SDOH) Interventions SDOH Screenings   Food Insecurity: No Food Insecurity (08/23/2022)  Housing: Low Risk  (08/23/2022)  Transportation Needs: No Transportation Needs (08/23/2022)  Utilities: Not At Risk (08/23/2022)  Alcohol Screen: Low Risk  (07/10/2022)  Financial Resource Strain: Low Risk  (07/10/2022)  Tobacco Use: Low Risk  (08/27/2022)    Readmission Risk Interventions    07/27/2022    2:36 PM  Readmission Risk Prevention Plan  Transportation Screening Complete  PCP or Specialist Appt within 3-5 Days Complete  HRI or Home Care Consult Complete  Social Work Consult for Recovery Care Planning/Counseling Complete  Palliative Care Screening Not Applicable  Medication Review Oceanographer) Complete

## 2022-08-29 NOTE — Progress Notes (Signed)
Report given to Tammy at [facility].

## 2022-08-30 DIAGNOSIS — R5381 Other malaise: Secondary | ICD-10-CM | POA: Diagnosis not present

## 2022-08-30 DIAGNOSIS — E785 Hyperlipidemia, unspecified: Secondary | ICD-10-CM | POA: Diagnosis not present

## 2022-08-30 DIAGNOSIS — I10 Essential (primary) hypertension: Secondary | ICD-10-CM | POA: Diagnosis not present

## 2022-08-30 DIAGNOSIS — T85518A Breakdown (mechanical) of other gastrointestinal prosthetic devices, implants and grafts, initial encounter: Secondary | ICD-10-CM | POA: Diagnosis not present

## 2022-08-30 DIAGNOSIS — I480 Paroxysmal atrial fibrillation: Secondary | ICD-10-CM | POA: Diagnosis not present

## 2022-09-03 DIAGNOSIS — K219 Gastro-esophageal reflux disease without esophagitis: Secondary | ICD-10-CM | POA: Diagnosis not present

## 2022-09-03 DIAGNOSIS — I480 Paroxysmal atrial fibrillation: Secondary | ICD-10-CM | POA: Diagnosis not present

## 2022-09-03 DIAGNOSIS — I10 Essential (primary) hypertension: Secondary | ICD-10-CM | POA: Diagnosis not present

## 2022-09-03 DIAGNOSIS — R5381 Other malaise: Secondary | ICD-10-CM | POA: Diagnosis not present

## 2022-09-03 DIAGNOSIS — T85518A Breakdown (mechanical) of other gastrointestinal prosthetic devices, implants and grafts, initial encounter: Secondary | ICD-10-CM | POA: Diagnosis not present

## 2022-09-03 DIAGNOSIS — E871 Hypo-osmolality and hyponatremia: Secondary | ICD-10-CM | POA: Diagnosis not present

## 2022-09-03 DIAGNOSIS — K5900 Constipation, unspecified: Secondary | ICD-10-CM | POA: Diagnosis not present

## 2022-09-03 DIAGNOSIS — E039 Hypothyroidism, unspecified: Secondary | ICD-10-CM | POA: Diagnosis not present

## 2022-09-05 DIAGNOSIS — R296 Repeated falls: Secondary | ICD-10-CM | POA: Diagnosis not present

## 2022-09-05 DIAGNOSIS — T85518A Breakdown (mechanical) of other gastrointestinal prosthetic devices, implants and grafts, initial encounter: Secondary | ICD-10-CM | POA: Diagnosis not present

## 2022-09-06 ENCOUNTER — Ambulatory Visit (HOSPITAL_COMMUNITY)
Admission: RE | Admit: 2022-09-06 | Discharge: 2022-09-06 | Disposition: A | Discharge status: Home / Self Care | Payer: PPO | Source: Ambulatory Visit | Attending: Student | Admitting: Student

## 2022-09-06 ENCOUNTER — Other Ambulatory Visit (HOSPITAL_COMMUNITY): Payer: Self-pay | Admitting: Student

## 2022-09-06 DIAGNOSIS — R109 Unspecified abdominal pain: Secondary | ICD-10-CM

## 2022-09-06 DIAGNOSIS — K8000 Calculus of gallbladder with acute cholecystitis without obstruction: Secondary | ICD-10-CM

## 2022-09-06 DIAGNOSIS — K810 Acute cholecystitis: Secondary | ICD-10-CM

## 2022-09-06 DIAGNOSIS — Z434 Encounter for attention to other artificial openings of digestive tract: Secondary | ICD-10-CM | POA: Diagnosis not present

## 2022-09-06 HISTORY — PX: IR EXCHANGE BILIARY DRAIN: IMG6046

## 2022-09-06 MED ORDER — LIDOCAINE HCL 1 % IJ SOLN: 20.0000 mL | Freq: Once | INTRAMUSCULAR | Status: DC

## 2022-09-06 MED ORDER — LIDOCAINE HCL 1 % IJ SOLN
INTRAMUSCULAR | Status: AC
  Filled 2022-09-06: qty 20

## 2022-09-06 MED ORDER — IOHEXOL 300 MG/ML  SOLN
50.0000 mL | Freq: Once | INTRAMUSCULAR | Status: AC | PRN
  Administered 2022-09-06: 10 mL

## 2022-09-06 MED ORDER — SILVER NITRATE-POT NITRATE 75-25 % EX MISC
CUTANEOUS | Status: AC
  Filled 2022-09-06: qty 10

## 2022-09-10 DIAGNOSIS — K219 Gastro-esophageal reflux disease without esophagitis: Secondary | ICD-10-CM | POA: Diagnosis not present

## 2022-09-10 DIAGNOSIS — E039 Hypothyroidism, unspecified: Secondary | ICD-10-CM | POA: Diagnosis not present

## 2022-09-10 DIAGNOSIS — I480 Paroxysmal atrial fibrillation: Secondary | ICD-10-CM | POA: Diagnosis not present

## 2022-09-10 DIAGNOSIS — R5381 Other malaise: Secondary | ICD-10-CM | POA: Diagnosis not present

## 2022-09-10 DIAGNOSIS — I10 Essential (primary) hypertension: Secondary | ICD-10-CM | POA: Diagnosis not present

## 2022-09-10 DIAGNOSIS — E785 Hyperlipidemia, unspecified: Secondary | ICD-10-CM | POA: Diagnosis not present

## 2022-09-13 ENCOUNTER — Other Ambulatory Visit (HOSPITAL_COMMUNITY): Payer: PPO

## 2022-09-17 DIAGNOSIS — I10 Essential (primary) hypertension: Secondary | ICD-10-CM | POA: Diagnosis not present

## 2022-09-17 DIAGNOSIS — K219 Gastro-esophageal reflux disease without esophagitis: Secondary | ICD-10-CM | POA: Diagnosis not present

## 2022-09-17 DIAGNOSIS — R5381 Other malaise: Secondary | ICD-10-CM | POA: Diagnosis not present

## 2022-09-17 DIAGNOSIS — E785 Hyperlipidemia, unspecified: Secondary | ICD-10-CM | POA: Diagnosis not present

## 2022-09-17 DIAGNOSIS — I480 Paroxysmal atrial fibrillation: Secondary | ICD-10-CM | POA: Diagnosis not present

## 2022-09-17 DIAGNOSIS — E039 Hypothyroidism, unspecified: Secondary | ICD-10-CM | POA: Diagnosis not present

## 2022-09-24 DIAGNOSIS — E785 Hyperlipidemia, unspecified: Secondary | ICD-10-CM | POA: Diagnosis not present

## 2022-09-24 DIAGNOSIS — K219 Gastro-esophageal reflux disease without esophagitis: Secondary | ICD-10-CM | POA: Diagnosis not present

## 2022-09-24 DIAGNOSIS — I10 Essential (primary) hypertension: Secondary | ICD-10-CM | POA: Diagnosis not present

## 2022-09-24 DIAGNOSIS — E871 Hypo-osmolality and hyponatremia: Secondary | ICD-10-CM | POA: Diagnosis not present

## 2022-09-24 DIAGNOSIS — R5381 Other malaise: Secondary | ICD-10-CM | POA: Diagnosis not present

## 2022-09-24 DIAGNOSIS — I480 Paroxysmal atrial fibrillation: Secondary | ICD-10-CM | POA: Diagnosis not present

## 2022-09-26 DIAGNOSIS — E039 Hypothyroidism, unspecified: Secondary | ICD-10-CM | POA: Diagnosis not present

## 2022-09-26 DIAGNOSIS — I10 Essential (primary) hypertension: Secondary | ICD-10-CM | POA: Diagnosis not present

## 2022-09-26 DIAGNOSIS — E785 Hyperlipidemia, unspecified: Secondary | ICD-10-CM | POA: Diagnosis not present

## 2022-09-26 DIAGNOSIS — I480 Paroxysmal atrial fibrillation: Secondary | ICD-10-CM | POA: Diagnosis not present

## 2022-09-28 DIAGNOSIS — E038 Other specified hypothyroidism: Secondary | ICD-10-CM | POA: Diagnosis not present

## 2022-09-28 DIAGNOSIS — I480 Paroxysmal atrial fibrillation: Secondary | ICD-10-CM | POA: Diagnosis not present

## 2022-10-03 DIAGNOSIS — E038 Other specified hypothyroidism: Secondary | ICD-10-CM | POA: Diagnosis not present

## 2022-10-03 DIAGNOSIS — I10 Essential (primary) hypertension: Secondary | ICD-10-CM | POA: Diagnosis not present

## 2022-10-04 DIAGNOSIS — K219 Gastro-esophageal reflux disease without esophagitis: Secondary | ICD-10-CM | POA: Diagnosis not present

## 2022-10-04 DIAGNOSIS — R5381 Other malaise: Secondary | ICD-10-CM | POA: Diagnosis not present

## 2022-10-04 DIAGNOSIS — I480 Paroxysmal atrial fibrillation: Secondary | ICD-10-CM | POA: Diagnosis not present

## 2022-10-04 DIAGNOSIS — I10 Essential (primary) hypertension: Secondary | ICD-10-CM | POA: Diagnosis not present

## 2022-10-04 DIAGNOSIS — E039 Hypothyroidism, unspecified: Secondary | ICD-10-CM | POA: Diagnosis not present

## 2022-10-04 DIAGNOSIS — E871 Hypo-osmolality and hyponatremia: Secondary | ICD-10-CM | POA: Diagnosis not present

## 2022-10-04 DIAGNOSIS — E785 Hyperlipidemia, unspecified: Secondary | ICD-10-CM | POA: Diagnosis not present

## 2022-10-09 DIAGNOSIS — F4322 Adjustment disorder with anxiety: Secondary | ICD-10-CM | POA: Diagnosis not present

## 2022-10-12 DIAGNOSIS — I10 Essential (primary) hypertension: Secondary | ICD-10-CM | POA: Diagnosis not present

## 2022-10-12 DIAGNOSIS — E038 Other specified hypothyroidism: Secondary | ICD-10-CM | POA: Diagnosis not present

## 2022-10-15 ENCOUNTER — Other Ambulatory Visit (HOSPITAL_COMMUNITY): Payer: PPO

## 2022-10-23 DIAGNOSIS — G4700 Insomnia, unspecified: Secondary | ICD-10-CM | POA: Diagnosis not present

## 2022-10-23 DIAGNOSIS — F32A Depression, unspecified: Secondary | ICD-10-CM | POA: Diagnosis not present

## 2022-10-24 ENCOUNTER — Other Ambulatory Visit: Payer: Self-pay | Admitting: Radiology

## 2022-10-25 ENCOUNTER — Inpatient Hospital Stay (HOSPITAL_COMMUNITY): Admission: RE | Admit: 2022-10-25 | Payer: PPO | Source: Ambulatory Visit

## 2022-10-26 DIAGNOSIS — E039 Hypothyroidism, unspecified: Secondary | ICD-10-CM | POA: Diagnosis not present

## 2022-10-26 DIAGNOSIS — E871 Hypo-osmolality and hyponatremia: Secondary | ICD-10-CM | POA: Diagnosis not present

## 2022-10-26 DIAGNOSIS — I480 Paroxysmal atrial fibrillation: Secondary | ICD-10-CM | POA: Diagnosis not present

## 2022-10-26 DIAGNOSIS — R5381 Other malaise: Secondary | ICD-10-CM | POA: Diagnosis not present

## 2022-10-26 DIAGNOSIS — I10 Essential (primary) hypertension: Secondary | ICD-10-CM | POA: Diagnosis not present

## 2022-10-26 DIAGNOSIS — E785 Hyperlipidemia, unspecified: Secondary | ICD-10-CM | POA: Diagnosis not present

## 2022-10-26 DIAGNOSIS — K219 Gastro-esophageal reflux disease without esophagitis: Secondary | ICD-10-CM | POA: Diagnosis not present

## 2022-11-04 DIAGNOSIS — I480 Paroxysmal atrial fibrillation: Secondary | ICD-10-CM | POA: Diagnosis not present

## 2022-11-04 DIAGNOSIS — I10 Essential (primary) hypertension: Secondary | ICD-10-CM | POA: Diagnosis not present

## 2022-11-04 DIAGNOSIS — E039 Hypothyroidism, unspecified: Secondary | ICD-10-CM | POA: Diagnosis not present

## 2022-11-04 DIAGNOSIS — E785 Hyperlipidemia, unspecified: Secondary | ICD-10-CM | POA: Diagnosis not present

## 2022-11-28 ENCOUNTER — Emergency Department (HOSPITAL_COMMUNITY)
Admission: EM | Admit: 2022-11-28 | Discharge: 2022-11-28 | Disposition: A | Discharge status: Home / Self Care | Payer: Medicare Other | Attending: Emergency Medicine | Admitting: Emergency Medicine

## 2022-11-28 ENCOUNTER — Other Ambulatory Visit: Payer: Self-pay

## 2022-11-28 DIAGNOSIS — N3000 Acute cystitis without hematuria: Secondary | ICD-10-CM | POA: Diagnosis not present

## 2022-11-28 DIAGNOSIS — N390 Urinary tract infection, site not specified: Secondary | ICD-10-CM

## 2022-11-28 DIAGNOSIS — K5641 Fecal impaction: Secondary | ICD-10-CM

## 2022-11-28 DIAGNOSIS — K5900 Constipation, unspecified: Secondary | ICD-10-CM | POA: Diagnosis not present

## 2022-11-28 DIAGNOSIS — Z7901 Long term (current) use of anticoagulants: Secondary | ICD-10-CM | POA: Insufficient documentation

## 2022-11-28 LAB — URINALYSIS, ROUTINE W REFLEX MICROSCOPIC
Bilirubin Urine: NEGATIVE
Glucose, UA: NEGATIVE mg/dL
Hgb urine dipstick: NEGATIVE
Ketones, ur: NEGATIVE mg/dL
Nitrite: NEGATIVE
Protein, ur: NEGATIVE mg/dL
Specific Gravity, Urine: 1.011 (ref 1.005–1.030)
pH: 6 (ref 5.0–8.0)

## 2022-11-28 MED ORDER — CEPHALEXIN 500 MG PO CAPS: 500.0000 mg | ORAL_CAPSULE | Freq: Three times a day (TID) | ORAL | 0 refills | Status: AC

## 2022-11-28 NOTE — ED Notes (Signed)
RN called Nada Maclachlan to give report to nurse unable to make contact VM left

## 2022-11-28 NOTE — Discharge Instructions (Addendum)
Miralax -- daily for constipation.

## 2022-11-28 NOTE — ED Notes (Signed)
RN called Nada Maclachlan to give report to nurse caring for patient nurse unsuccessful and left VM. PTAR called to have patient transported back to [facility]

## 2022-11-28 NOTE — ED Provider Notes (Signed)
[place] EMERGENCY DEPARTMENT AT [facility] Provider Note   CSN: [id number] Arrival date & time: 11/28/22  2952     History  Chief Complaint  Patient presents with   Constipation    Ellen Chandler is a 89 y.o. female.  Pt complains of severe constipation.  Pt is from a nursing facility.  RN reports pt has a large stool ball with dilated rectum.    The history is provided by the patient. No language interpreter was used.  Constipation Severity:  Moderate Timing:  Constant Chronicity:  New Stool description:  Formed and large Relieved by:  Nothing Worsened by:  Nothing Ineffective treatments:  None tried Associated symptoms: no abdominal pain        Home Medications Prior to Admission medications   Medication Sig Start Date End Date Taking? Authorizing Provider  acetaminophen (TYLENOL) 500 MG tablet Take 500-1,000 mg by mouth every 6 (six) hours as needed for moderate pain.    [provider]  amiodarone (PACERONE) 200 MG tablet Take 1 tablet (200 mg total) by mouth daily. Patient taking differently: Take 200 mg by mouth in the morning. 02/14/22   Robbie Lis M, PA-C  apixaban (ELIQUIS) 5 MG TABS tablet Take 1 tablet (5 mg total) by mouth 2 (two) times daily. 02/14/22   Robbie Lis M, PA-C  lactose free nutrition (BOOST) LIQD Take 237 mLs by mouth at bedtime.    [provider]  levothyroxine (SYNTHROID, LEVOTHROID) 88 MCG tablet Take 88 mcg by mouth daily before breakfast.    [provider]  metoprolol succinate (TOPROL XL) 25 MG 24 hr tablet Take 0.5 tablets (12.5 mg total) by mouth daily. 08/26/22 09/25/22  Barnetta Chapel, MD  Multiple Vitamins-Minerals (ONE-A-DAY WOMENS 50+ ADVANTAGE) TABS Take 1 tablet by mouth daily with breakfast.    [provider]  oxyCODONE (OXY IR/ROXICODONE) 5 MG immediate release tablet Take 1 tablet (5 mg total) by mouth every 4 (four) hours as needed for moderate pain.  08/29/22   Rai, Ripudeep K, MD  pantoprazole (PROTONIX) 40 MG tablet Take 40 mg by mouth 2 (two) times daily. 09/18/21   [provider]  polyethylene glycol powder (GLYCOLAX/MIRALAX) 17 GM/SCOOP powder Take 17 g by mouth daily as needed for mild constipation.    [provider]  sodium chloride 1 g tablet Take 1 tablet (1 g total) by mouth 2 (two) times daily with a meal. 07/31/22   Danford, Earl Lites, MD  spironolactone (ALDACTONE) 25 MG tablet Take 0.5 tablets (12.5 mg total) by mouth daily. 07/20/22 08/19/22  Jacklynn Ganong, FNP      Allergies    Patient has no known allergies.    Review of Systems   Review of Systems  Gastrointestinal:  Positive for constipation. Negative for abdominal pain.  All other systems reviewed and are negative.   Physical Exam Updated Vital Signs Temp 97.6 F (36.4 C) (Oral)   Ht 5\' 5"  (1.651 m)   Wt 60.3 kg   SpO2 96%   BMI 22.13 kg/m  Physical Exam Vitals and nursing note reviewed.  Constitutional:      Appearance: She is well-developed.  HENT:     Head: Normocephalic.  Cardiovascular:     Rate and Rhythm: Normal rate.  Pulmonary:     Effort: Pulmonary effort is normal.  Abdominal:     General: There is no distension.  Genitourinary:    Comments: Rectum dilated with large stool,  Musculoskeletal:  General: Normal range of motion.  Skin:    General: Skin is warm.  Neurological:     General: No focal deficit present.     Mental Status: She is alert and oriented to person, place, and time.  Psychiatric:        Mood and Affect: Mood normal.     ED Results / Procedures / Treatments   Labs (all labs ordered are listed, but only abnormal results are displayed) Labs Reviewed  URINE CULTURE  URINALYSIS, ROUTINE W REFLEX MICROSCOPIC    EKG None  Radiology No results found.  Procedures Fecal disimpaction  Date/Time: 11/28/2022 10:15 AM  Performed by: Elson Areas, PA-C Authorized by: Elson Areas, PA-C  Consent: Verbal consent obtained. Consent given by: patient Patient identity confirmed: verbally with patient Time out: Immediately prior to procedure a "time out" was called to verify the correct patient, procedure, equipment, support staff and site/side marked as required. Comments: Large stool ball removed manually         Medications Ordered in ED Medications - No data to display  ED Course/ Medical Decision Making/ A&P                             Medical Decision Making Amount and/or Complexity of Data Reviewed Labs: ordered. Decision-making details documented in ED Course.    Details: Ua  many bact wbc's 21-50            Final Clinical Impression(s) / ED Diagnoses Final diagnoses:  Fecal impaction in rectum (HCC)  Constipation, unspecified constipation type  Urinary tract infection without hematuria, site unspecified    Rx / DC Orders ED Discharge Orders          Ordered    cephALEXin (KEFLEX) 500 MG capsule  3 times daily        11/28/22 1229           An After Visit Summary was printed and given to the patient.     Osie Cheeks 11/28/22 1233    Lorre Nick, MD 11/29/22 [phone]

## 2022-11-28 NOTE — ED Triage Notes (Signed)
Patient brought in by EMS from [facility] with c/o constipation. She states she has not had a BM in 3 days and having trouble urinating. She c/o lower ABD pain but reports being able to pass gas.  170/100 103 16 96% RA CBG: 124

## 2022-11-28 NOTE — ED Notes (Signed)
RN completed soap suds enema successfully patient able to pass BM and voice relief

## 2022-11-29 LAB — URINE CULTURE
Culture: >=100000 — AB
Special Requests: NORMAL

## 2022-12-01 ENCOUNTER — Telehealth (HOSPITAL_BASED_OUTPATIENT_CLINIC_OR_DEPARTMENT_OTHER): Payer: Self-pay | Admitting: *Deleted

## 2022-12-01 NOTE — Telephone Encounter (Signed)
Post ED Visit - Positive Culture Follow-up  Culture report reviewed by antimicrobial stewardship pharmacist: [facility] []  [facility], Pharm.D. []  Celedonio Miyamoto, Pharm.D., BCPS AQ-ID []  Garvin Fila, Pharm.D., BCPS []  Georgina Pillion, Pharm.D., BCPS []  [place], [address].D., BCPS, AAHIVP []  Estella Husk, Pharm.D., BCPS, AAHIVP []  Lysle Pearl, PharmD, BCPS []  Phillips Climes, PharmD, BCPS []  Agapito Games, PharmD, BCPS []  Verlan Friends, PharmD []  Mervyn Gay, PharmD, BCPS []  Vinnie Level, PharmD  [facility] []  Len Childs, PharmD []  Greer Pickerel, PharmD []  Adalberto Cole, PharmD []  Perlie Gold, Rph []  Lonell Face) Jean Rosenthal, PharmD []  Earl Many, PharmD []  Junita Push, PharmD []  Dorna Leitz, PharmD []  Terrilee Files, PharmD []  Lynann Beaver, PharmD [x]  Keturah Barre, PharmD []  Loralee Pacas, PharmD []  Bernadene Person, PharmD   Positive urine culture Treated with Cephalexin, organism sensitive to the same and no further patient follow-up is required at this time.  Virl Axe [facility] 12/01/2022, 1:29 PM

## 2023-02-25 ENCOUNTER — Telehealth: Payer: Self-pay

## 2023-02-25 NOTE — Telephone Encounter (Signed)
A user error has taken place: encounter opened in error, closed for administrative reasons.

## 2023-03-06 IMAGING — CT CT CERVICAL SPINE W/O CM
2 series · 14 of 27 positions shown, 18 images · non-contrast
Comparison: July 15, 2014.

CLINICAL DATA: Fall.

EXAM:
CT HEAD WITHOUT CONTRAST
CT CERVICAL SPINE WITHOUT CONTRAST
TECHNIQUE: Multidetector CT imaging of the head and cervical spine was
performed following the standard protocol without intravenous
contrast. Multiplanar CT image reconstructions of the cervical spine
were also generated.

[Series 3: c spine soft · axial · 0.33mm/px · z∈[-266,-144]mm · 9 of 73 slices shown, 12 images]
[im 6/73  soft-tissue]
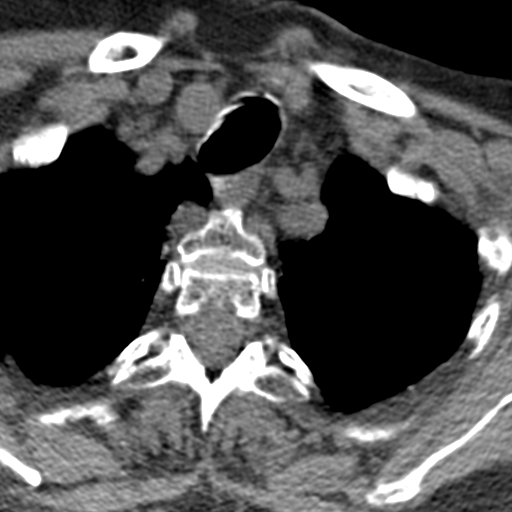
[im 6/73  bone]
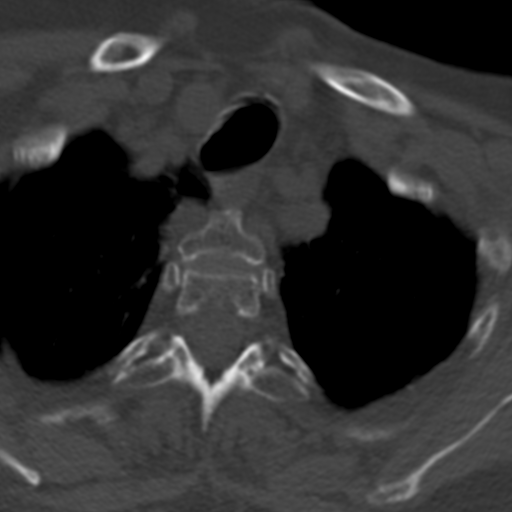
[im 17/73  bone]
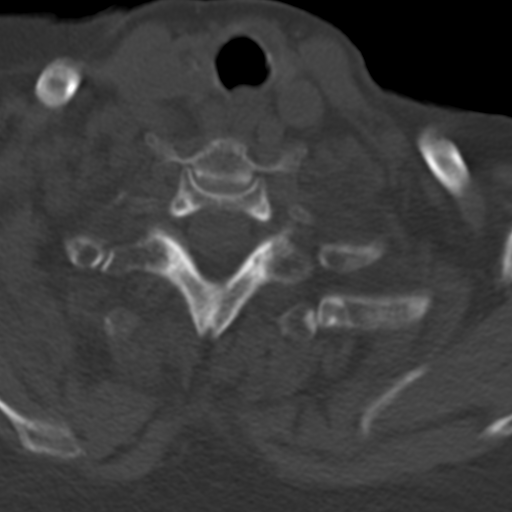
[im 23/73  bone]
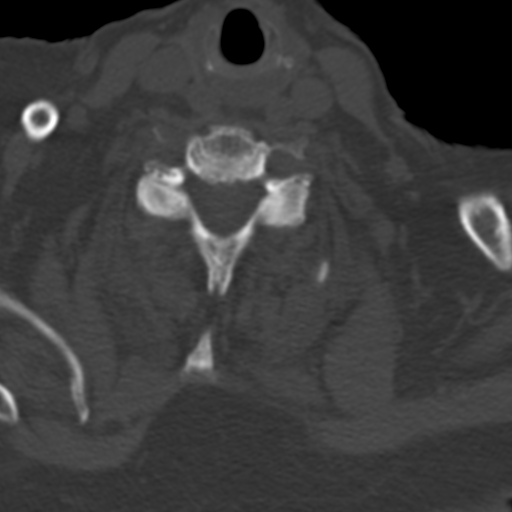
[im 28/73  bone]
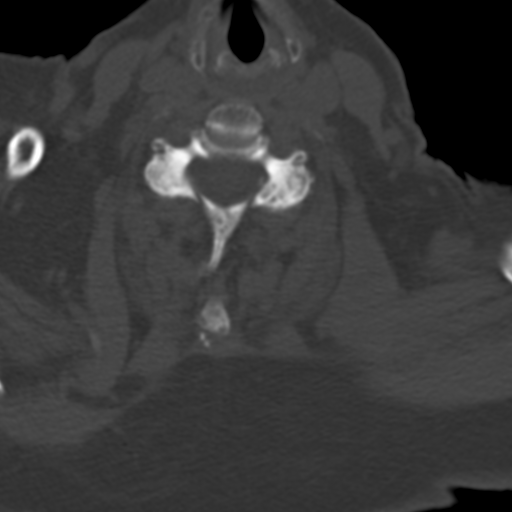
[im 39/73  soft-tissue]
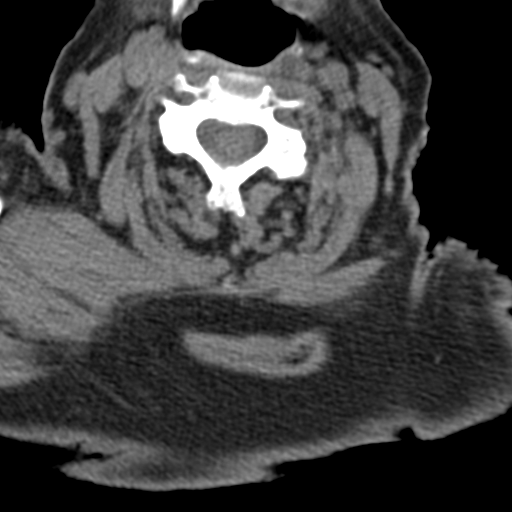
[im 39/73  bone]
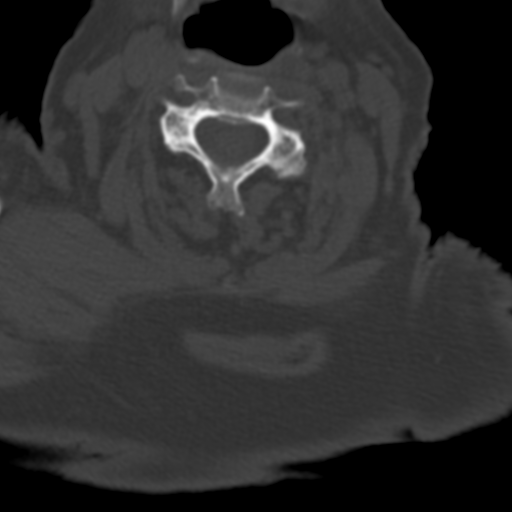
[im 45/73  bone]
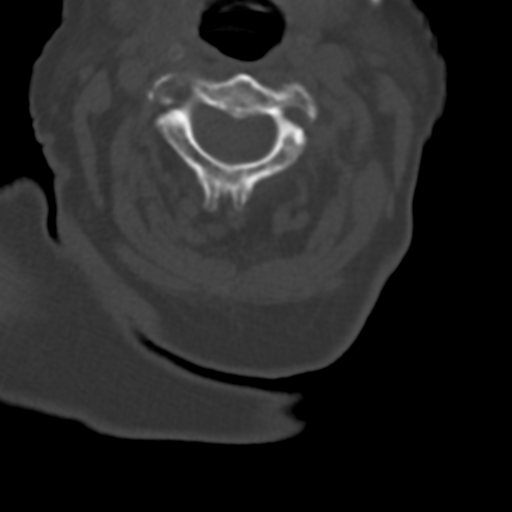
[im 50/73  bone]
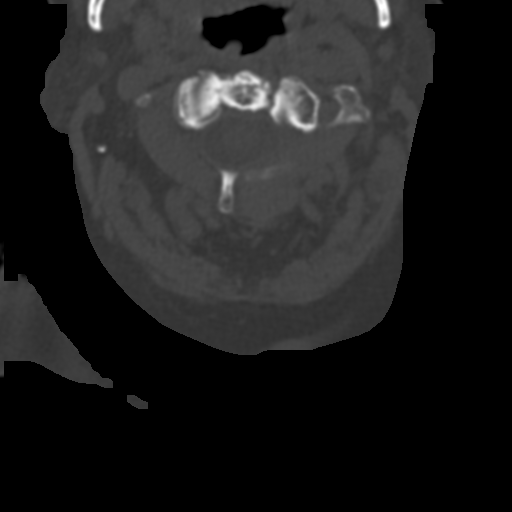
[im 61/73  bone]
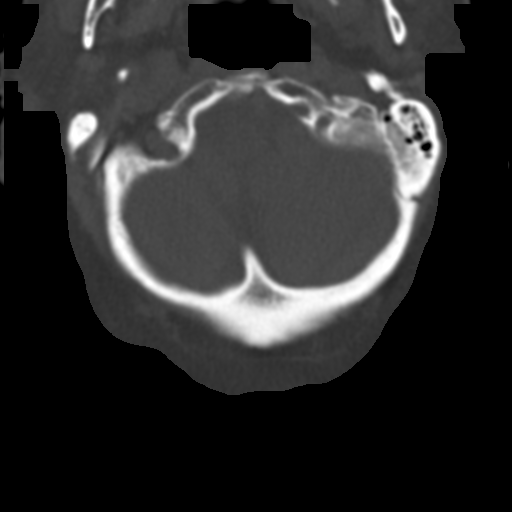
[im 67/73  soft-tissue]
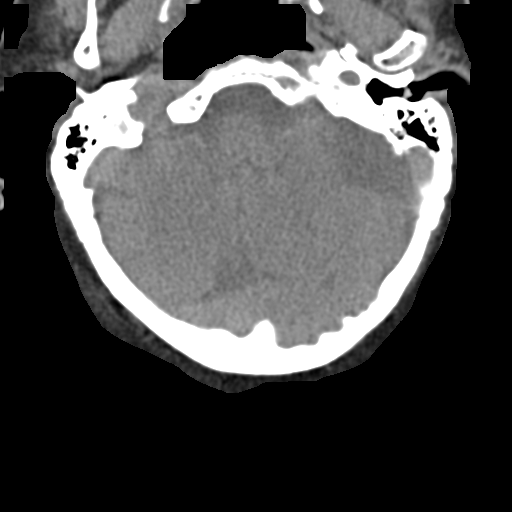
[im 67/73  bone]
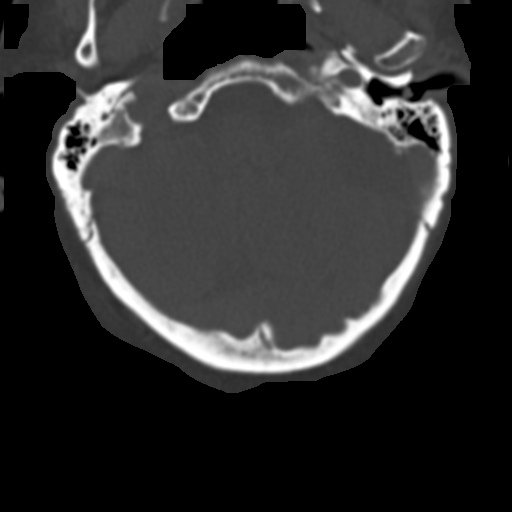

[Series 6: sagittal bone · sagittal · 0.31mm/px · 5 of 47 slices shown, 6 images]
[im 16/47  bone]
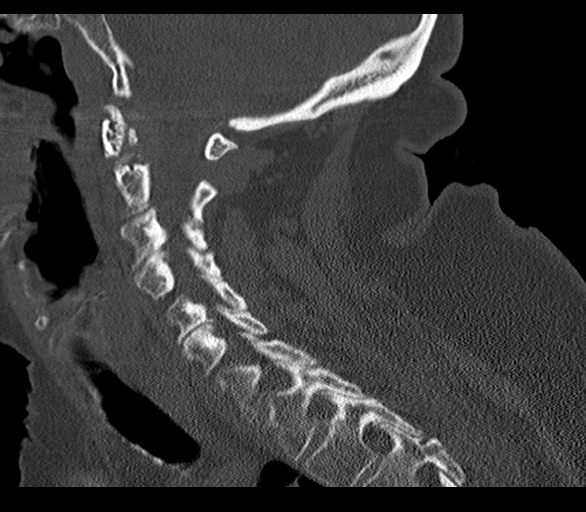
[im 20/47  bone]
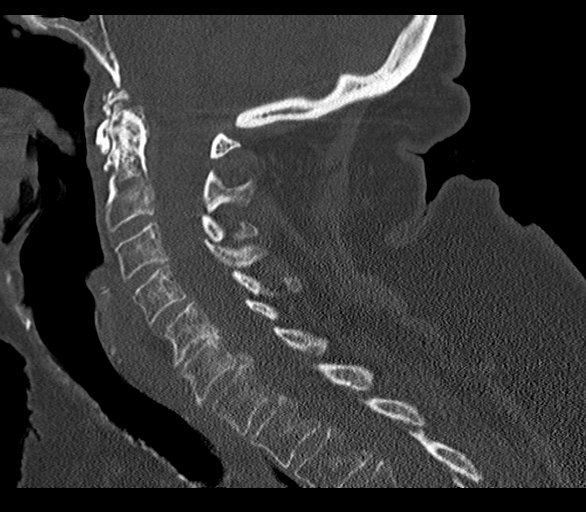
[im 24/47  soft-tissue]
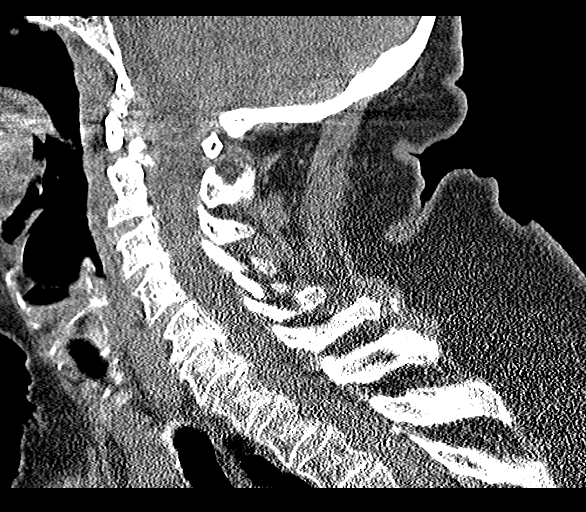
[im 24/47  bone]
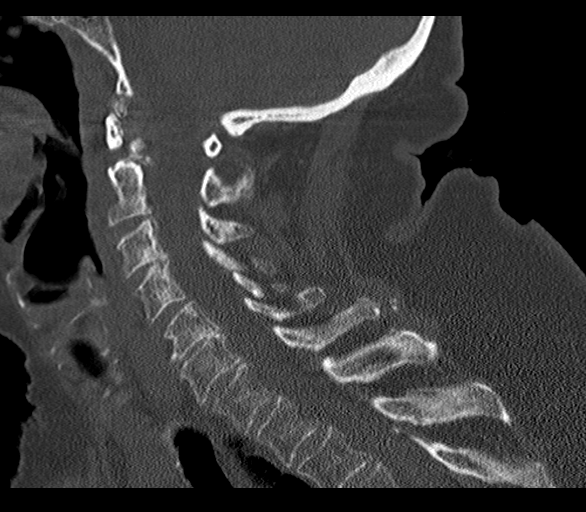
[im 27/47  bone]
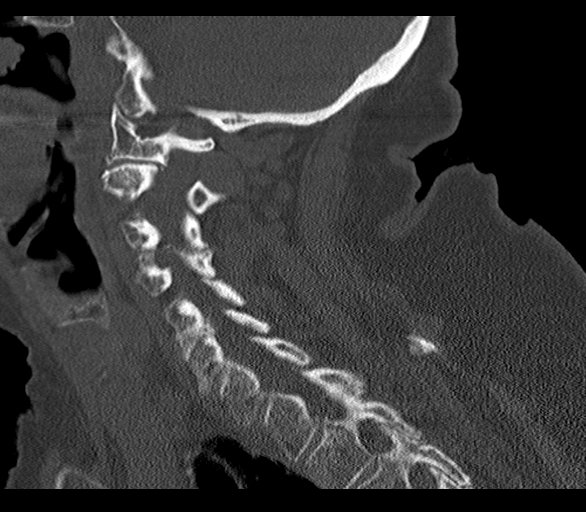
[im 31/47  bone]
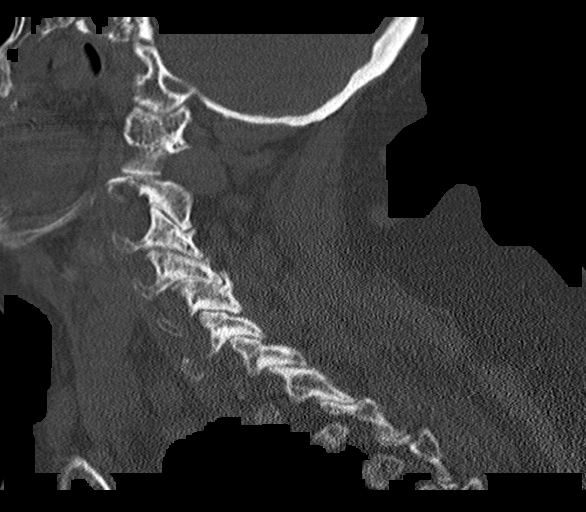

[14 of 27 positions shown; findings below may reference images not displayed]

FINDINGS: CT HEAD FINDINGS

Brain: Mild chronic ischemic white matter disease is noted. No mass
effect or midline shift is noted. Ventricular size is within normal
limits. There is no evidence of mass lesion, hemorrhage or acute
infarction.

Vascular: No hyperdense vessel or unexpected calcification.

Skull: Normal. Negative for fracture or focal lesion.

Sinuses/Orbits: No acute finding.

Other: None.

CT CERVICAL SPINE FINDINGS

Alignment: Minimal grade 1 anterolisthesis of C4-5 is noted
secondary to posterior facet joint hypertrophy.

Skull base and vertebrae: No acute fracture. No primary bone lesion
or focal pathologic process.

Soft tissues and spinal canal: No prevertebral fluid or swelling. No
visible canal hematoma.

Disc levels:  Moderate degenerative disc disease is noted at C5-6.

Upper chest: Negative.

Other: Degenerative changes are seen involving posterior facet
joints bilaterally.
IMPRESSION: No acute intracranial abnormality seen.

Moderate degenerative disc disease is noted at C5-6. No acute
abnormality seen in the cervical spine.

## 2023-03-08 DEATH — deceased
